# Patient Record
Sex: Male | Born: 1945 | Race: White | Hispanic: No | Marital: Married | Smoking: Former smoker
Health system: Southern US, Community
[De-identification: ages and names within clinical notes are randomized; demographics above are authoritative.]

## PROBLEM LIST (undated history)

## (undated) DIAGNOSIS — C801 Malignant (primary) neoplasm, unspecified: Secondary | ICD-10-CM

## (undated) DIAGNOSIS — G4730 Sleep apnea, unspecified: Secondary | ICD-10-CM

## (undated) DIAGNOSIS — G4489 Other headache syndrome: Secondary | ICD-10-CM

## (undated) DIAGNOSIS — E039 Hypothyroidism, unspecified: Secondary | ICD-10-CM

## (undated) DIAGNOSIS — E785 Hyperlipidemia, unspecified: Secondary | ICD-10-CM

## (undated) DIAGNOSIS — E079 Disorder of thyroid, unspecified: Secondary | ICD-10-CM

## (undated) DIAGNOSIS — R110 Nausea: Secondary | ICD-10-CM

## (undated) DIAGNOSIS — I6529 Occlusion and stenosis of unspecified carotid artery: Secondary | ICD-10-CM

## (undated) DIAGNOSIS — Z923 Personal history of irradiation: Secondary | ICD-10-CM

## (undated) DIAGNOSIS — I10 Essential (primary) hypertension: Secondary | ICD-10-CM

## (undated) DIAGNOSIS — R1310 Dysphagia, unspecified: Secondary | ICD-10-CM

## (undated) DIAGNOSIS — M1990 Unspecified osteoarthritis, unspecified site: Secondary | ICD-10-CM

## (undated) DIAGNOSIS — R634 Abnormal weight loss: Secondary | ICD-10-CM

## (undated) HISTORY — DX: Occlusion and stenosis of unspecified carotid artery: I65.29

## (undated) HISTORY — DX: Abnormal weight loss: R63.4

## (undated) HISTORY — PX: BACK SURGERY: SHX140

## (undated) HISTORY — PX: TUMOR REMOVAL: SHX12

## (undated) HISTORY — DX: Nausea: R11.0

## (undated) HISTORY — DX: Other headache syndrome: G44.89

## (undated) HISTORY — DX: Hypothyroidism, unspecified: E03.9

## (undated) HISTORY — DX: Hyperlipidemia, unspecified: E78.5

---

## 2001-07-16 ENCOUNTER — Ambulatory Visit (HOSPITAL_COMMUNITY): Admission: RE | Admit: 2001-07-16 | Discharge: 2001-07-16 | Payer: Self-pay | Admitting: Family Medicine

## 2001-07-16 ENCOUNTER — Encounter: Payer: Self-pay | Admitting: Family Medicine

## 2001-07-30 ENCOUNTER — Ambulatory Visit (HOSPITAL_COMMUNITY): Admission: RE | Admit: 2001-07-30 | Discharge: 2001-07-30 | Payer: Self-pay | Admitting: Family Medicine

## 2001-07-30 ENCOUNTER — Encounter: Payer: Self-pay | Admitting: Family Medicine

## 2001-08-06 ENCOUNTER — Encounter: Payer: Self-pay | Admitting: Family Medicine

## 2001-08-06 ENCOUNTER — Ambulatory Visit (HOSPITAL_COMMUNITY): Admission: RE | Admit: 2001-08-06 | Discharge: 2001-08-06 | Payer: Self-pay | Admitting: Family Medicine

## 2001-08-12 ENCOUNTER — Other Ambulatory Visit: Admission: RE | Admit: 2001-08-12 | Discharge: 2001-08-12 | Payer: Self-pay | Admitting: Otolaryngology

## 2001-09-10 ENCOUNTER — Inpatient Hospital Stay (HOSPITAL_COMMUNITY): Admission: AD | Admit: 2001-09-10 | Discharge: 2001-09-14 | Payer: Self-pay | Admitting: Otolaryngology

## 2001-09-10 ENCOUNTER — Encounter: Payer: Self-pay | Admitting: Otolaryngology

## 2001-09-17 ENCOUNTER — Ambulatory Visit: Admission: RE | Admit: 2001-09-17 | Discharge: 2001-12-16 | Payer: Self-pay | Admitting: Radiation Oncology

## 2001-09-22 ENCOUNTER — Ambulatory Visit (HOSPITAL_COMMUNITY): Admission: RE | Admit: 2001-09-22 | Discharge: 2001-09-22 | Payer: Self-pay | Admitting: Dentistry

## 2001-09-24 ENCOUNTER — Encounter (HOSPITAL_COMMUNITY): Admission: RE | Admit: 2001-09-24 | Discharge: 2001-12-23 | Payer: Self-pay | Admitting: Dentistry

## 2002-01-12 ENCOUNTER — Encounter: Admission: RE | Admit: 2002-01-12 | Discharge: 2002-01-22 | Payer: Self-pay | Admitting: Otolaryngology

## 2002-02-09 ENCOUNTER — Encounter: Payer: Self-pay | Admitting: Otolaryngology

## 2002-02-09 ENCOUNTER — Encounter: Admission: RE | Admit: 2002-02-09 | Discharge: 2002-02-09 | Payer: Self-pay | Admitting: Otolaryngology

## 2002-08-09 ENCOUNTER — Encounter: Admission: RE | Admit: 2002-08-09 | Discharge: 2002-08-09 | Payer: Self-pay | Admitting: Otolaryngology

## 2002-08-09 ENCOUNTER — Encounter: Payer: Self-pay | Admitting: Otolaryngology

## 2002-12-09 ENCOUNTER — Ambulatory Visit: Admission: RE | Admit: 2002-12-09 | Discharge: 2002-12-09 | Payer: Self-pay | Admitting: Radiation Oncology

## 2003-09-15 ENCOUNTER — Ambulatory Visit: Admission: RE | Admit: 2003-09-15 | Discharge: 2003-09-15 | Payer: Self-pay | Admitting: Radiation Oncology

## 2003-09-22 ENCOUNTER — Ambulatory Visit: Admission: RE | Admit: 2003-09-22 | Discharge: 2003-09-22 | Payer: Self-pay | Admitting: Radiation Oncology

## 2004-03-22 ENCOUNTER — Ambulatory Visit: Admission: RE | Admit: 2004-03-22 | Discharge: 2004-03-22 | Payer: Self-pay | Admitting: Radiation Oncology

## 2004-04-05 ENCOUNTER — Ambulatory Visit: Admission: RE | Admit: 2004-04-05 | Discharge: 2004-04-05 | Payer: Self-pay | Admitting: Family Medicine

## 2004-04-09 ENCOUNTER — Ambulatory Visit (HOSPITAL_COMMUNITY): Admission: RE | Admit: 2004-04-09 | Discharge: 2004-04-09 | Payer: Self-pay | Admitting: Radiation Oncology

## 2004-04-09 ENCOUNTER — Ambulatory Visit: Admission: RE | Admit: 2004-04-09 | Discharge: 2004-04-25 | Payer: Self-pay | Admitting: Radiation Oncology

## 2004-04-09 IMAGING — CR DG CHEST 2V
2 series · 2 of 2 positions shown · non-contrast
Comparison: [DATE] and [DATE].

CLINICAL DATA: Tonsillar carcinoma.  Follow-up.
 CHEST TWO VIEWS

[view not recorded (1 of 2)]
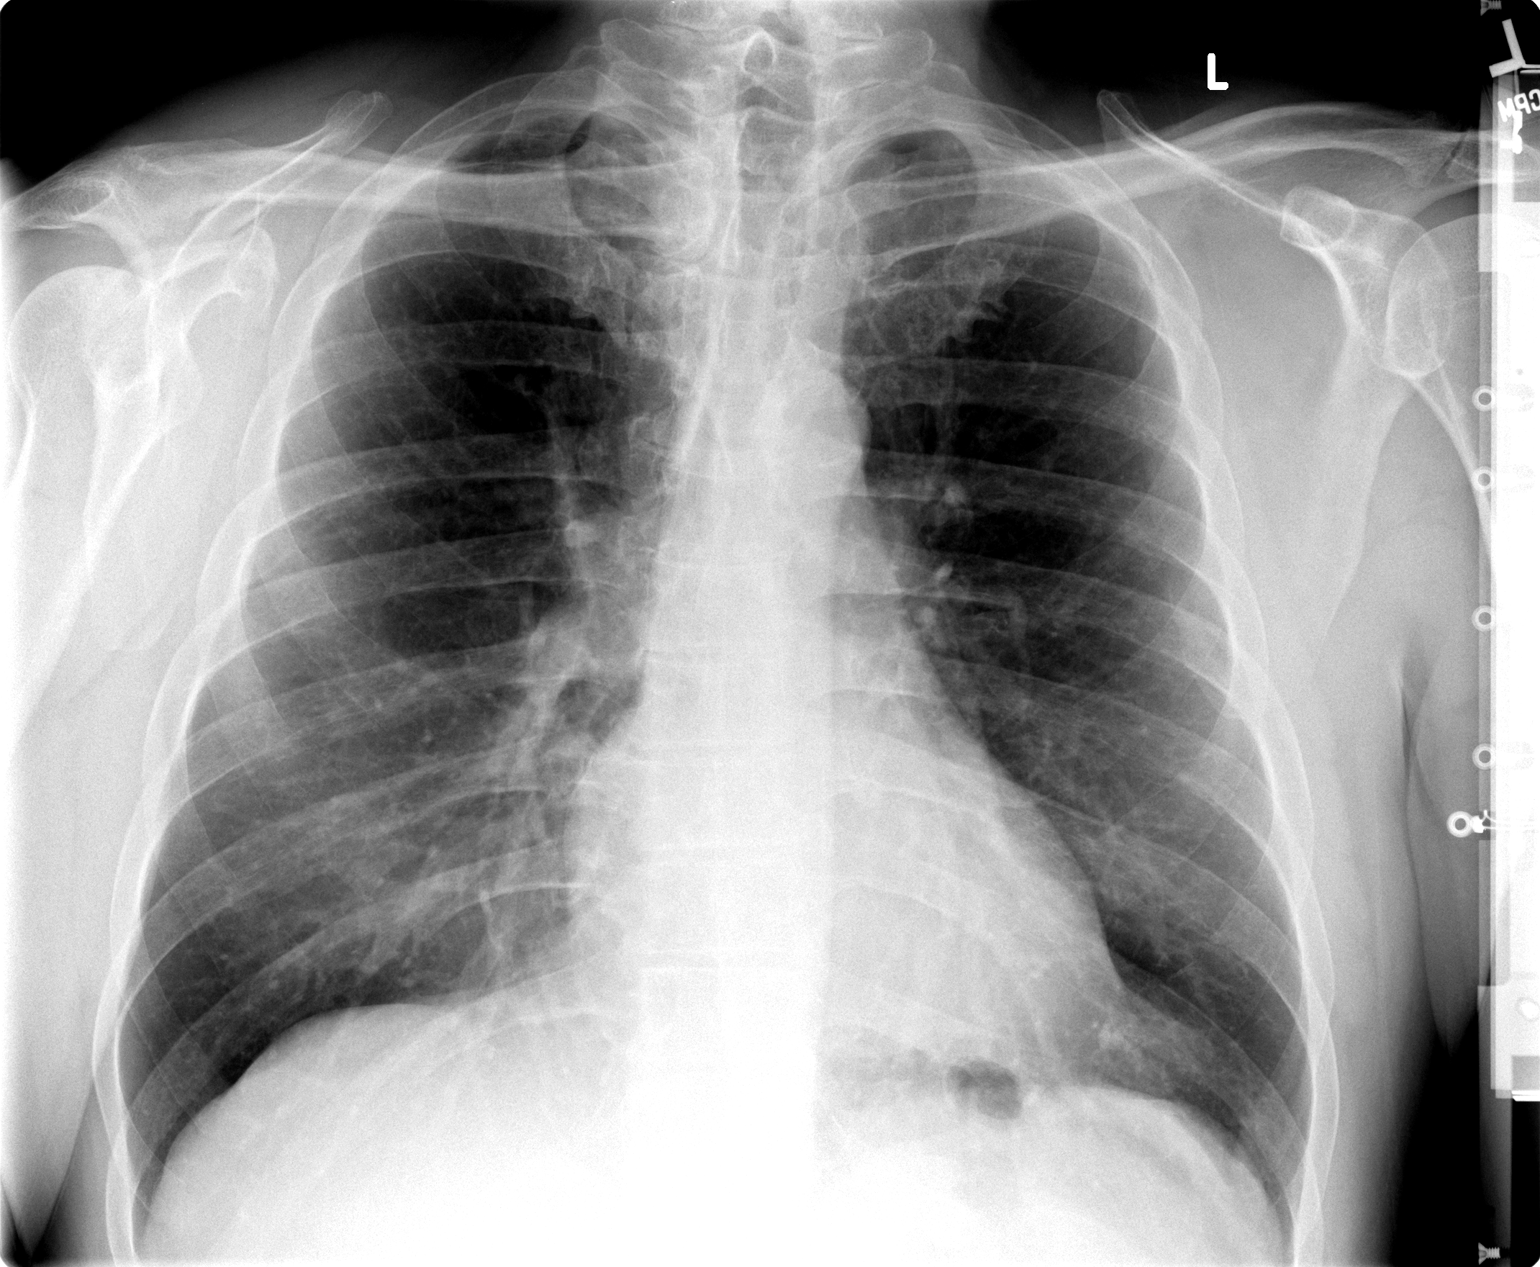

[view not recorded (2 of 2)]
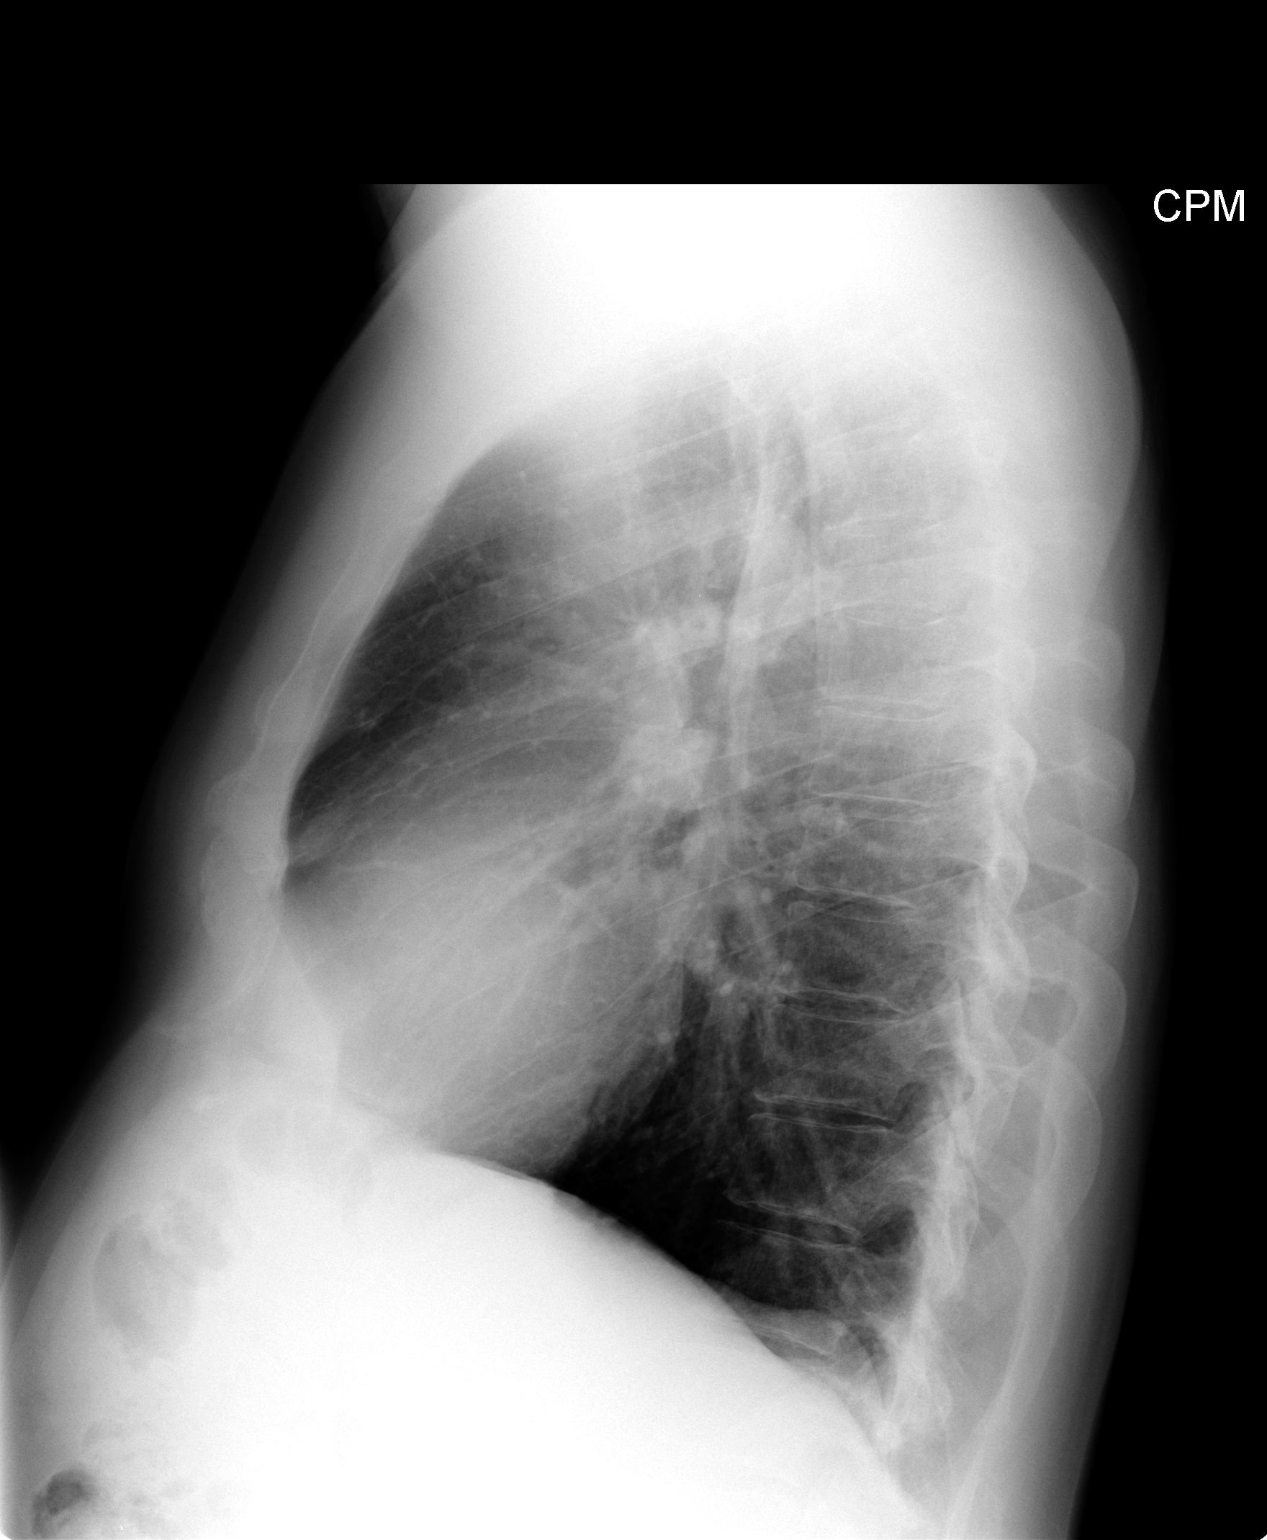

[2 of 2 positions shown; findings below may reference images not displayed]

Cardiomediastinal silhouette is stable and unremarkable.  The lungs are clear.  Bony thorax and upper abdomen are unremarkable.  
 IMPRESSION
 No evidence of active cardiopulmonary disease.

## 2004-07-27 IMAGING — CR DG CHEST 2V
2 series · 2 of 2 positions shown · non-contrast
Comparison: [DATE].

CLINICAL DATA: Tonsillar carcinoma.  Evaluate for metastatic disease. 
 CHEST - 2 VIEW:

[w chest pa]
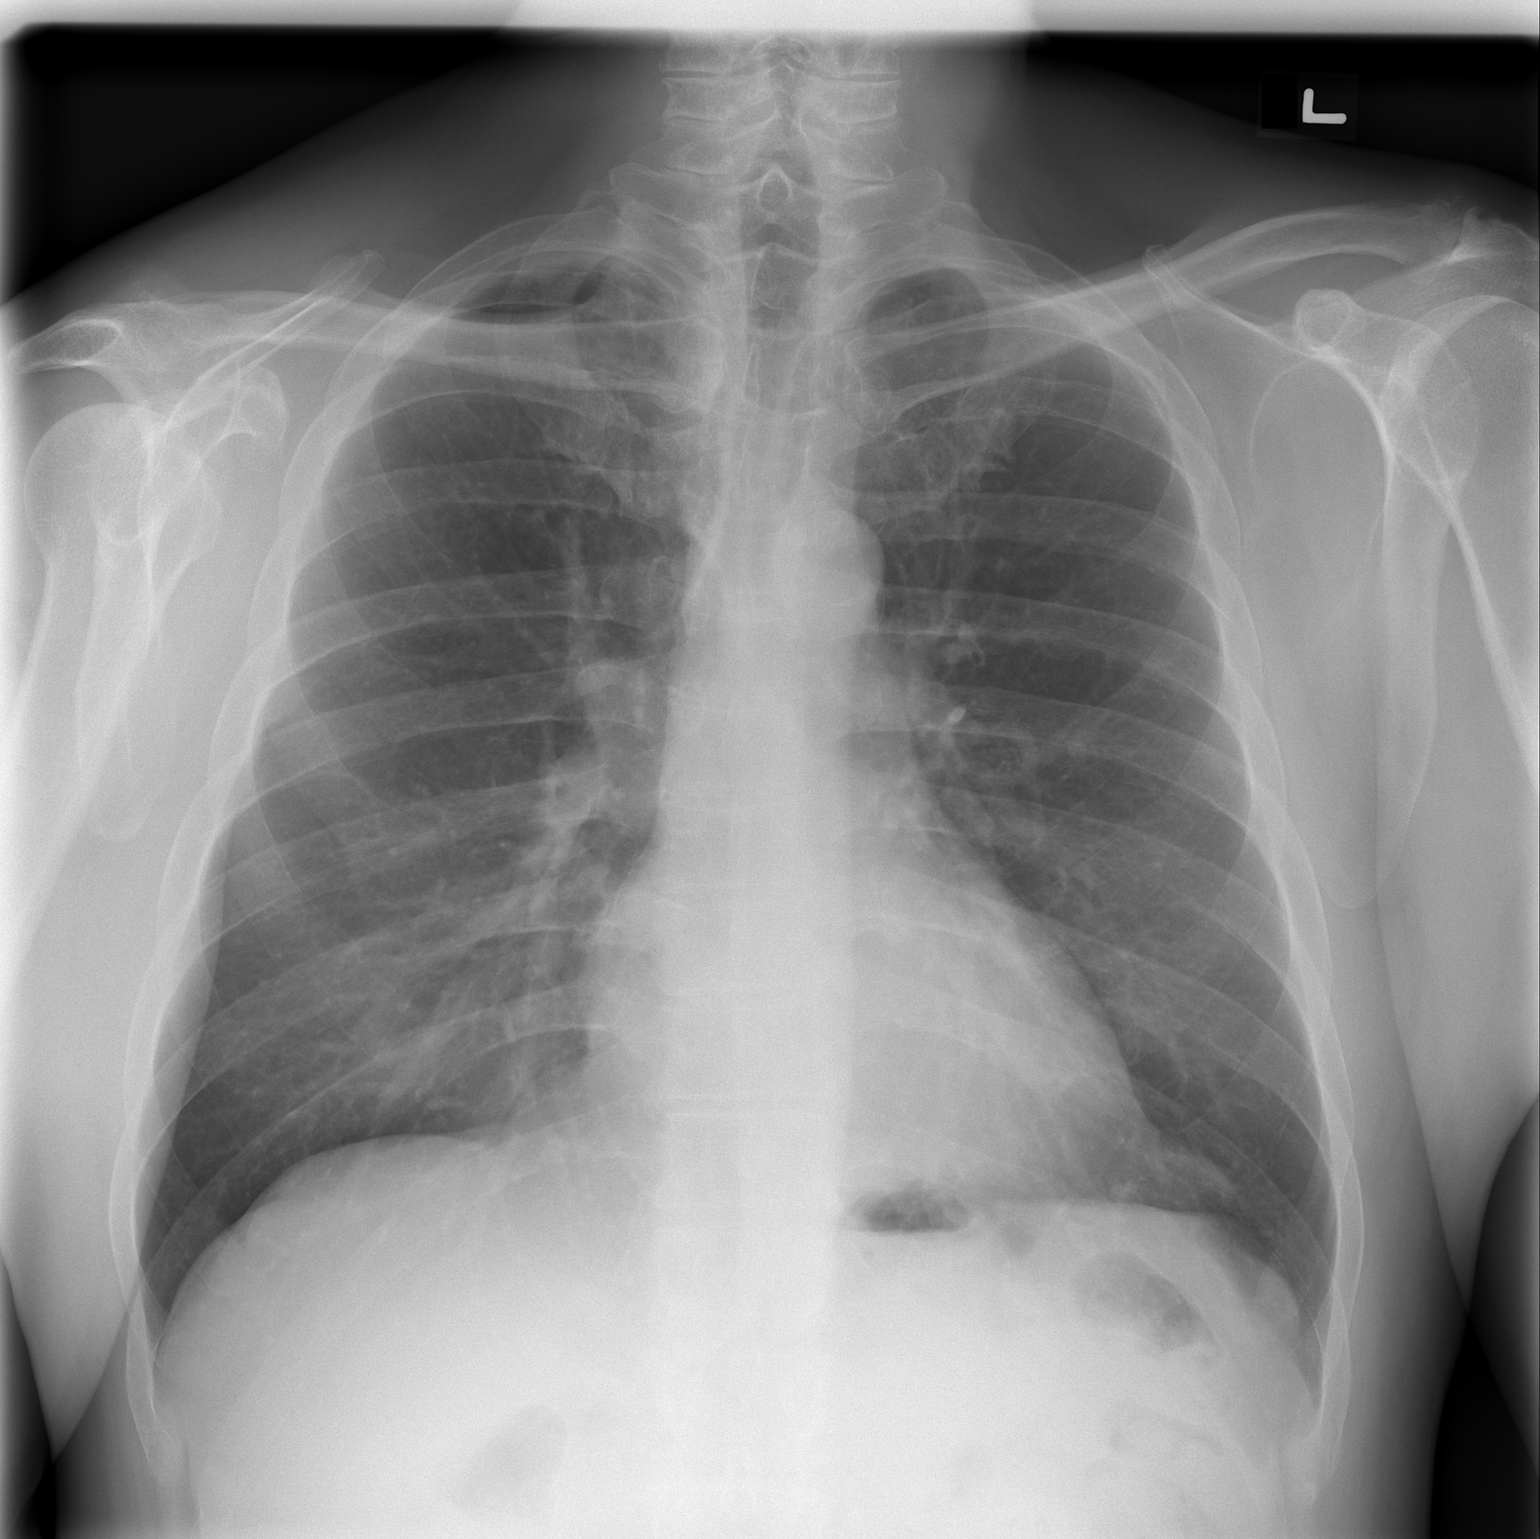

[w chest lat]
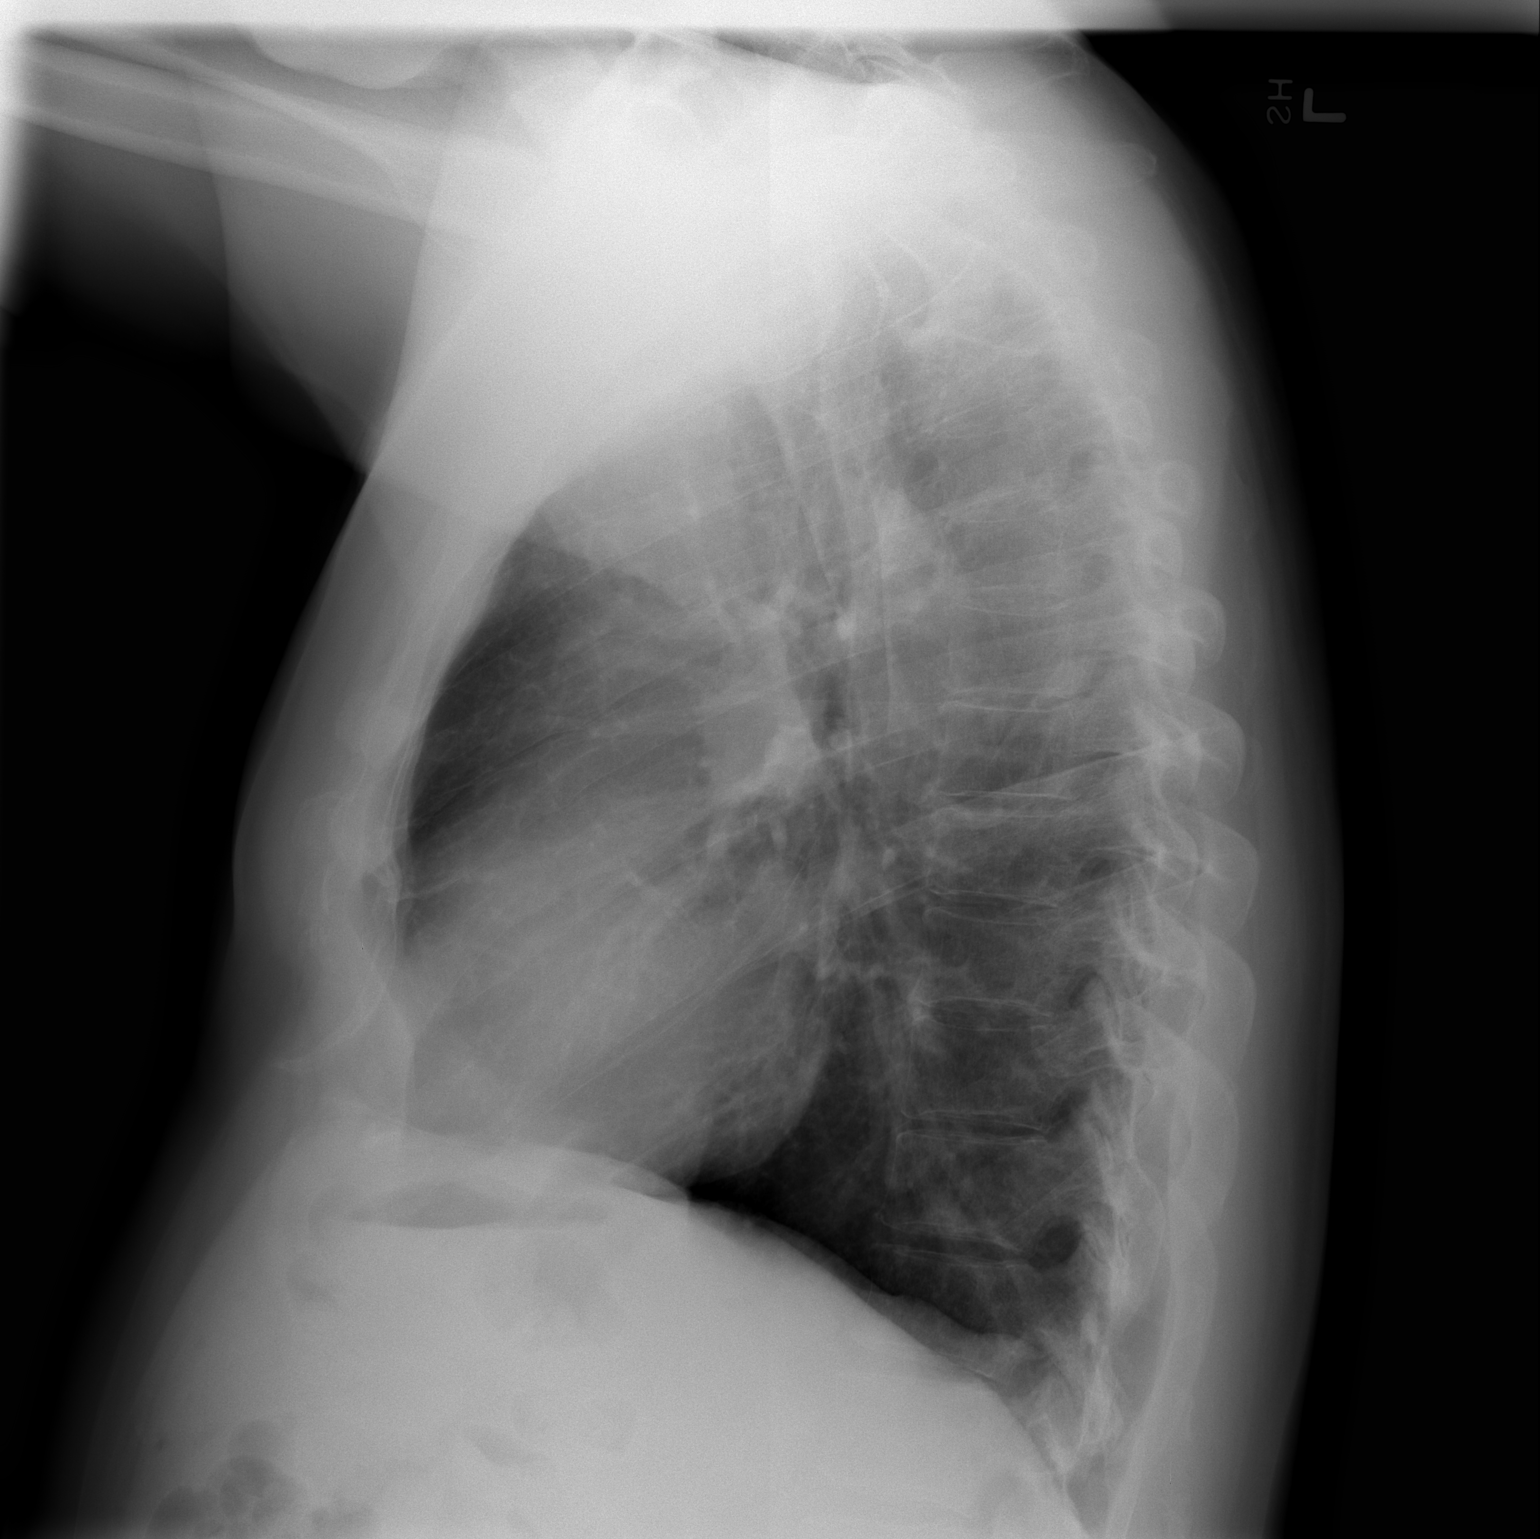

[2 of 2 positions shown; findings below may reference images not displayed]

The cardiomediastinal contours are stable without evidence of adenopathy.  The lungs are clear.  There is no pleural effusion.  Osseous structures appear unchanged.
IMPRESSION: Stable chest.  No active findings. No evidence of metastatic disease.

## 2004-10-04 ENCOUNTER — Ambulatory Visit: Admission: RE | Admit: 2004-10-04 | Discharge: 2004-10-04 | Payer: Self-pay | Admitting: Radiation Oncology

## 2005-04-11 ENCOUNTER — Ambulatory Visit (HOSPITAL_COMMUNITY): Admission: RE | Admit: 2005-04-11 | Discharge: 2005-04-11 | Payer: Self-pay | Admitting: Radiation Oncology

## 2006-04-10 ENCOUNTER — Ambulatory Visit (HOSPITAL_COMMUNITY): Admission: RE | Admit: 2006-04-10 | Discharge: 2006-04-10 | Payer: Self-pay | Admitting: Radiation Oncology

## 2006-04-10 IMAGING — CR DG CHEST 2V
2 series · 2 of 2 positions shown · non-contrast
Comparison: [DATE].

CLINICAL DATA: Annual follow-up, tonsillar CA.  Previous smoker. 
 CHEST - 2 VIEW:

[w chest pa]
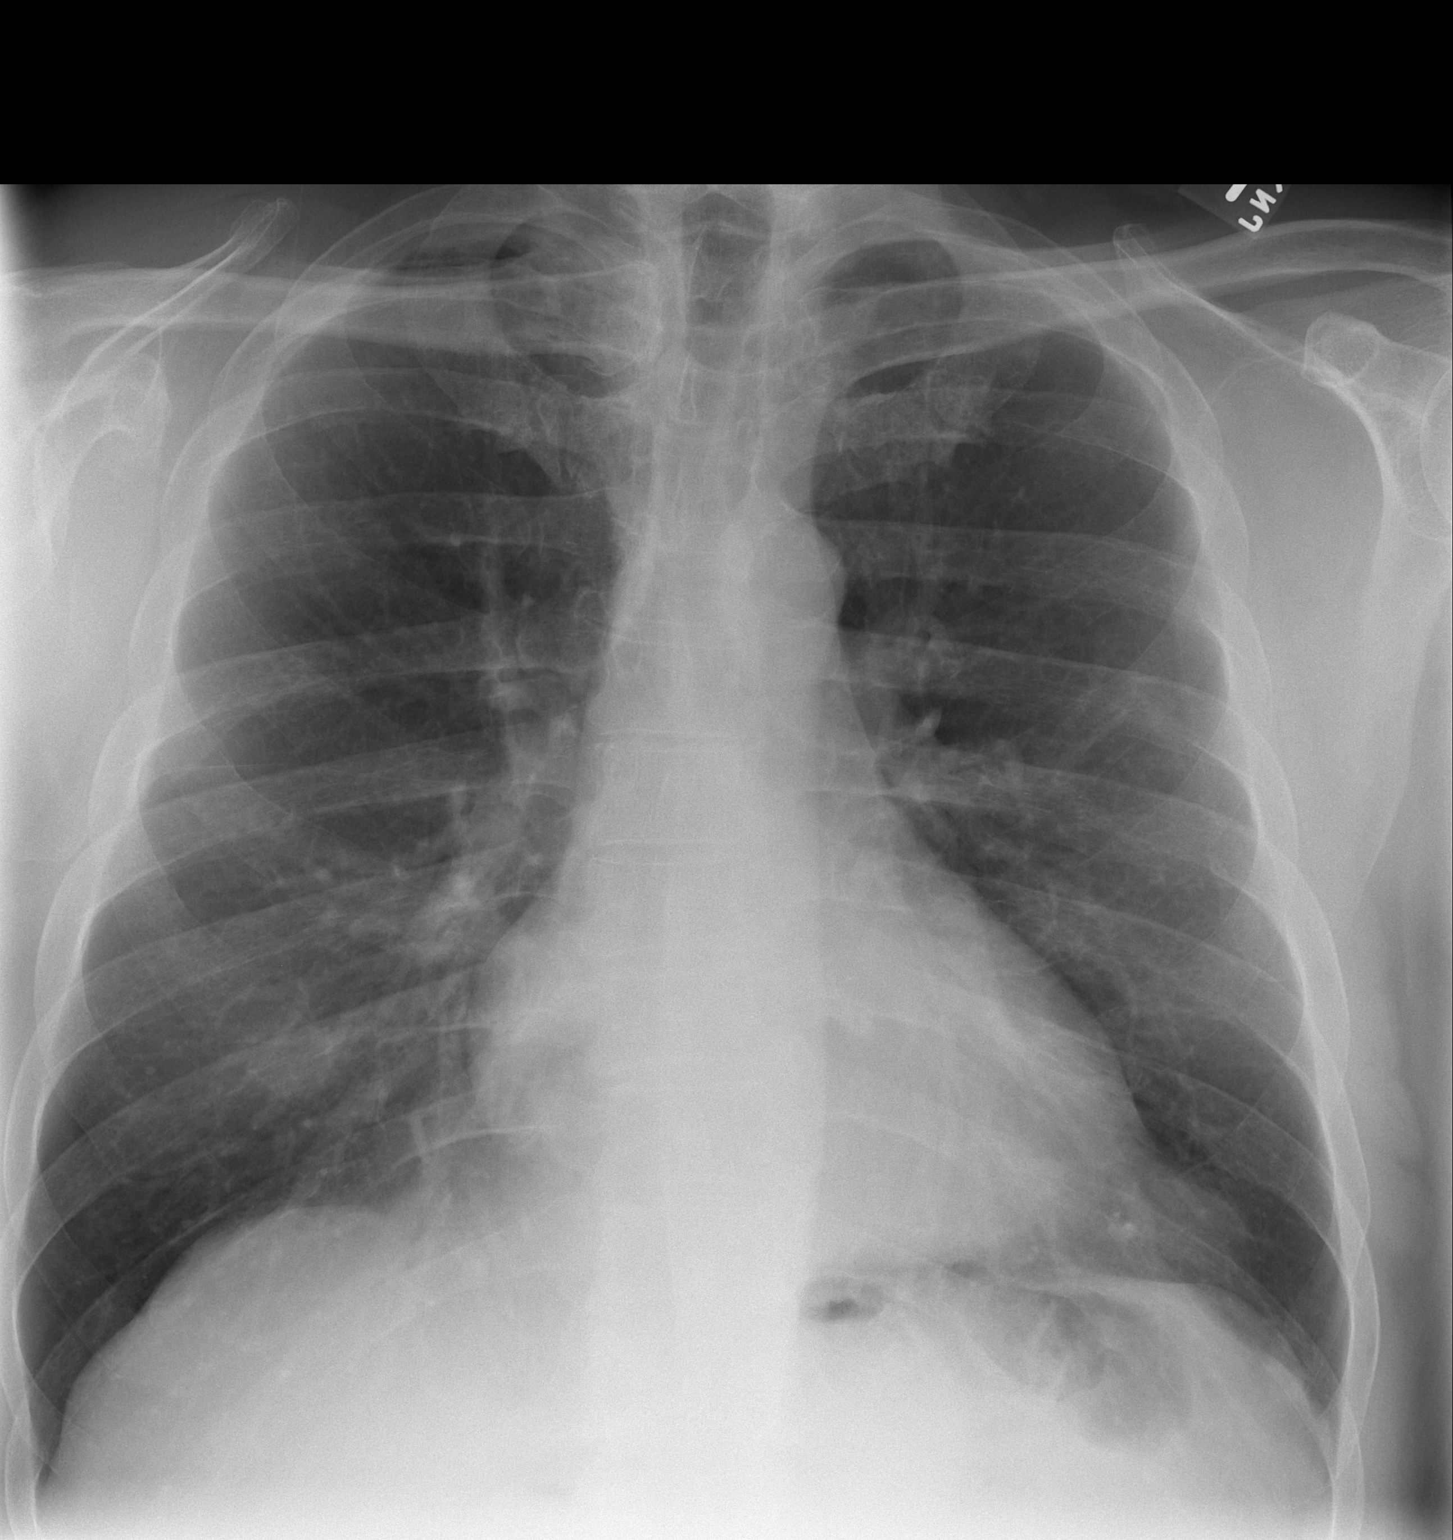

[w chest lat]
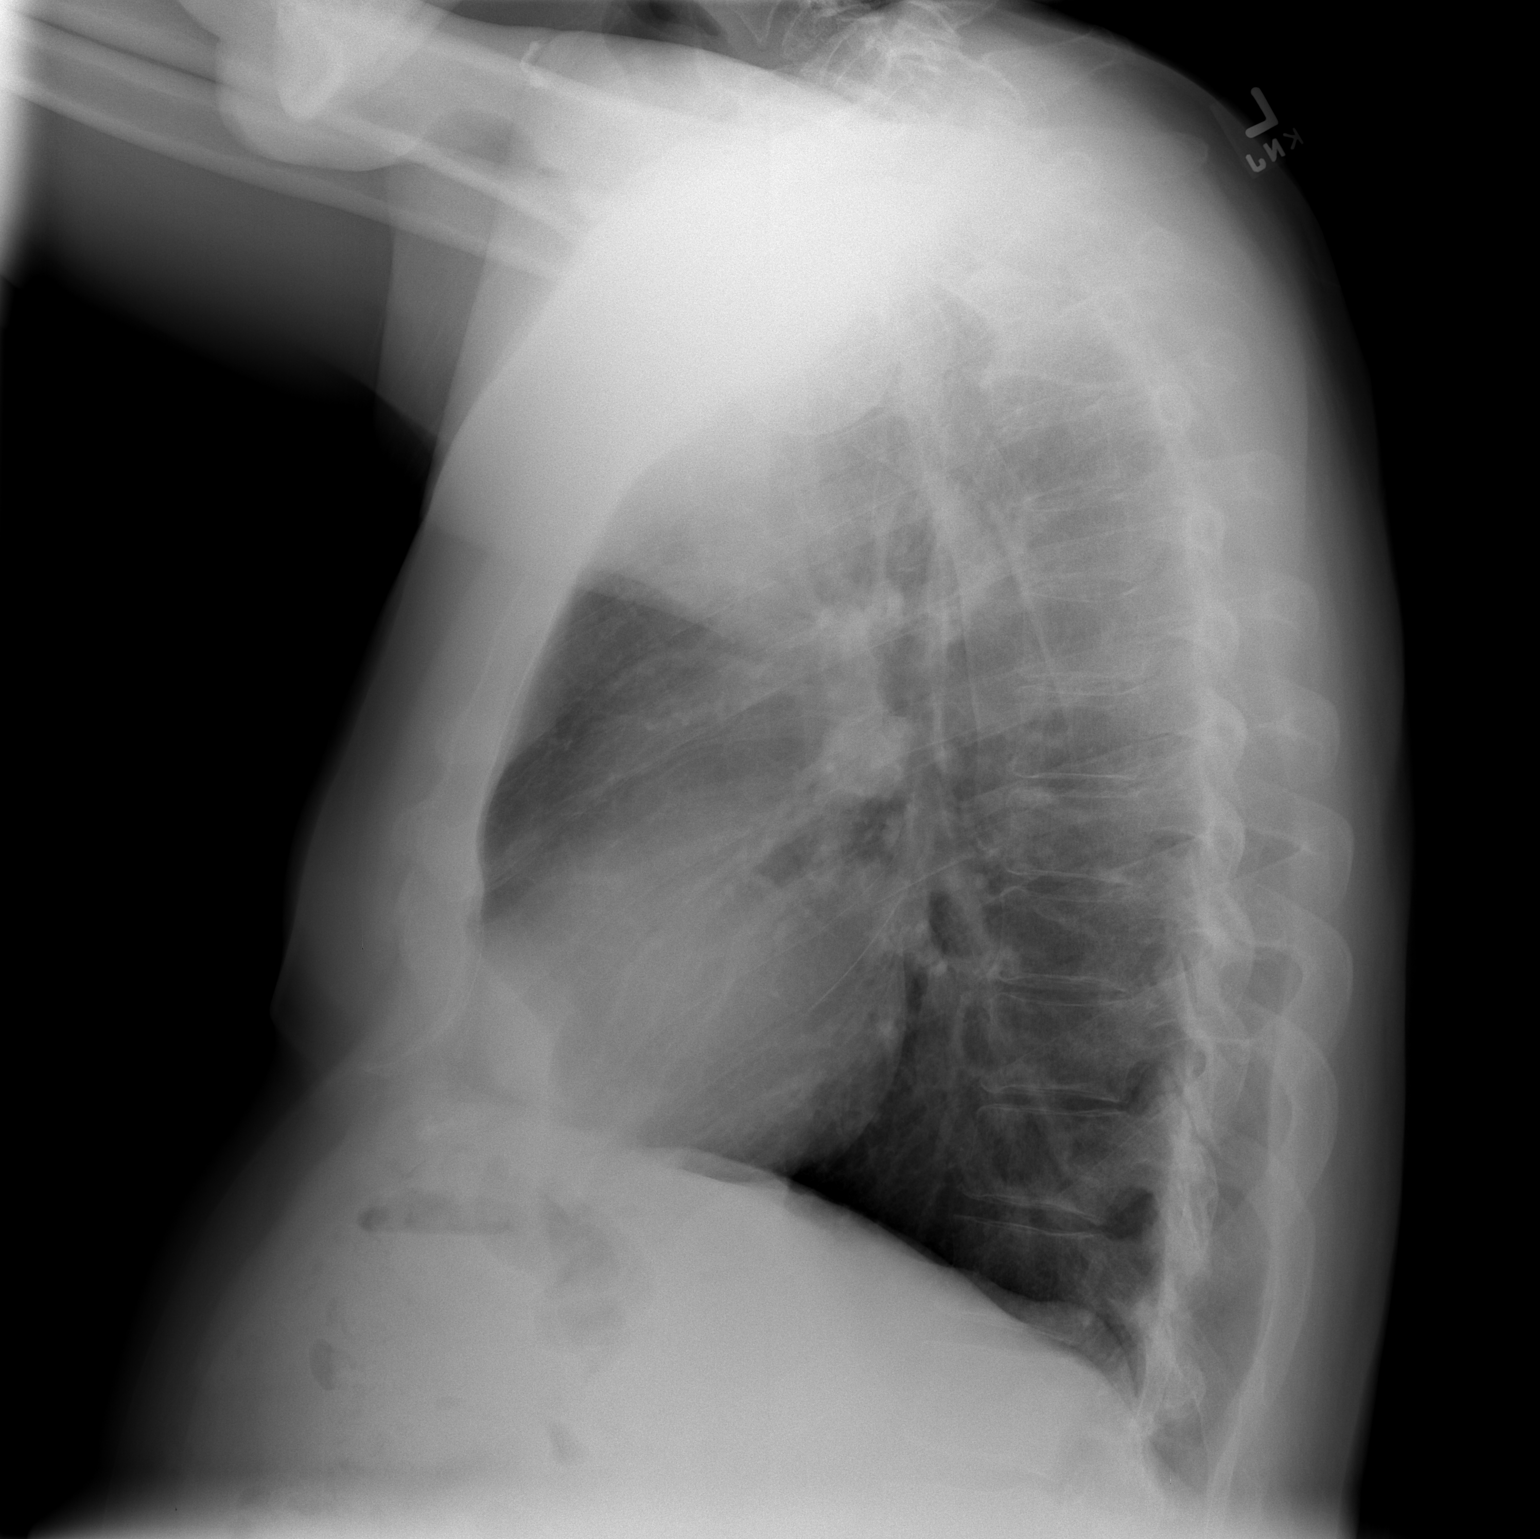

[2 of 2 positions shown; findings below may reference images not displayed]

FINDINGS: Subtle oval opacity projects over the right lower lung zone.  It may be due to confluence of vascular and bony structures simulating a nodule/mass.  I cannot exclude a mass in the right lower lung zone.  This suspect density measures approximately 2.2 cm.  
 Mild cardiomegaly.   Consider Chest CT scan for further assessment.
IMPRESSION: Right oval opacity projects over right lower lung zone.  I view this with moderate suspicion for a nodule/mass.  I do not appreciate this on the lateral view, however, a mass cannot be excluded.  Recommend CT.

## 2006-04-14 ENCOUNTER — Ambulatory Visit: Admission: RE | Admit: 2006-04-14 | Discharge: 2006-07-13 | Payer: Self-pay | Admitting: Radiation Oncology

## 2006-04-15 LAB — CREATININE, SERUM: Creatinine, Ser: 0.82 mg/dL (ref 0.40–1.50)

## 2006-04-16 ENCOUNTER — Ambulatory Visit (HOSPITAL_COMMUNITY): Admission: RE | Admit: 2006-04-16 | Discharge: 2006-04-16 | Payer: Self-pay | Admitting: Radiation Oncology

## 2006-04-16 IMAGING — CT CT CHEST W/ CM
2 of 4 series · 15 of 36 positions shown, 18 images · IV contrast (omnipaque)
Comparison: [DATE].

CLINICAL DATA: Question nodule or mass on chest x-ray of [DATE].  History of tonsillar cancer. Exsmoker.  
 CHEST CT WITH CONTRAST:
TECHNIQUE: Multidetector CT imaging of the chest was performed following the standard protocol during bolus administration of intravenous contrast.
 Contrast:  80 cc Omnipaque 300.

[Series 2: chest routine 5.0 b40f · axial · 0.70mm/px · z∈[-330,-30]mm · 12 of 72 slices shown, 15 images]
[im 6/72  mediastinal]
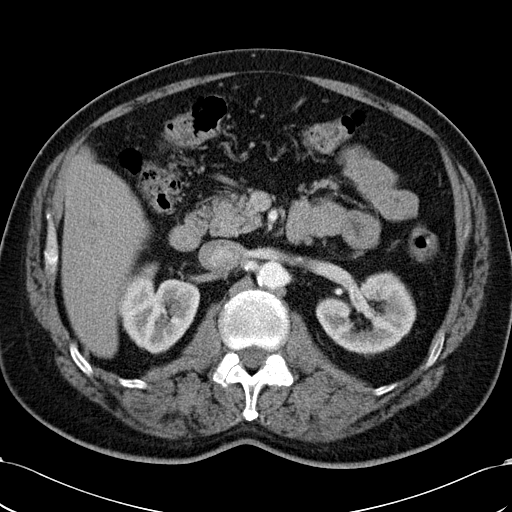
[im 6/72  lung]
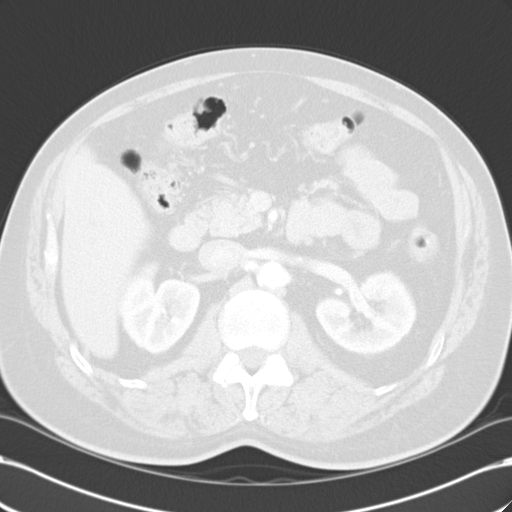
[im 11/72  lung]
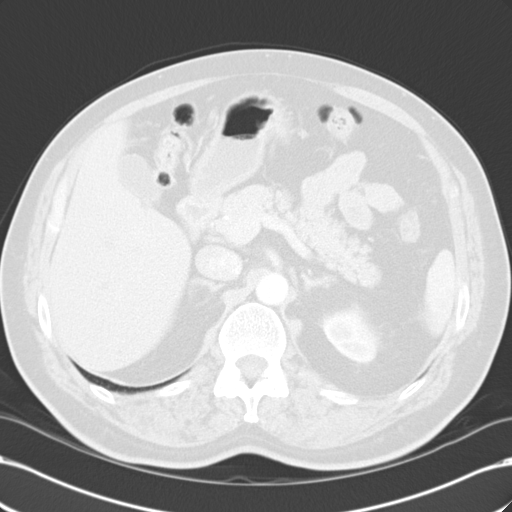
[im 16/72  lung]
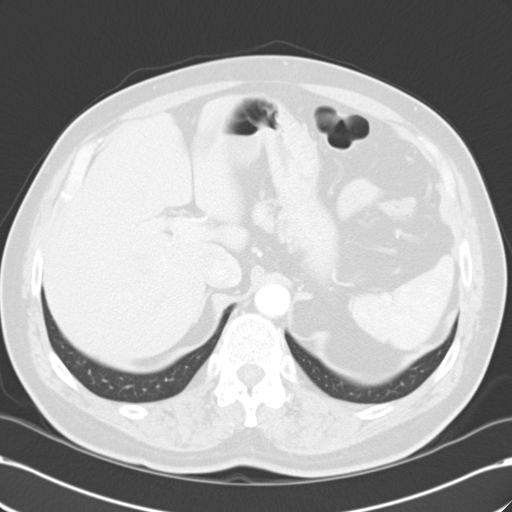
[im 21/72  lung]
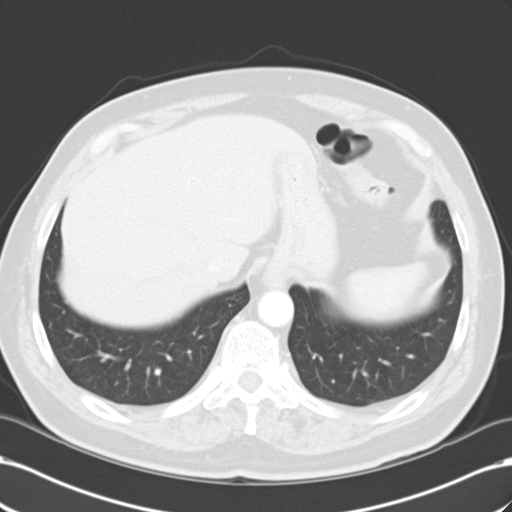
[im 26/72  mediastinal]
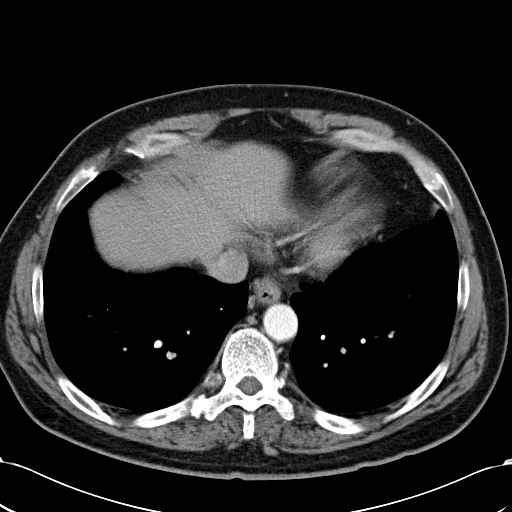
[im 26/72  lung]
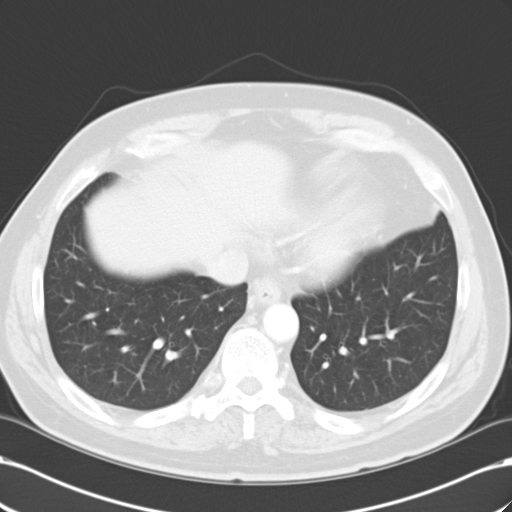
[im 31/72  lung]
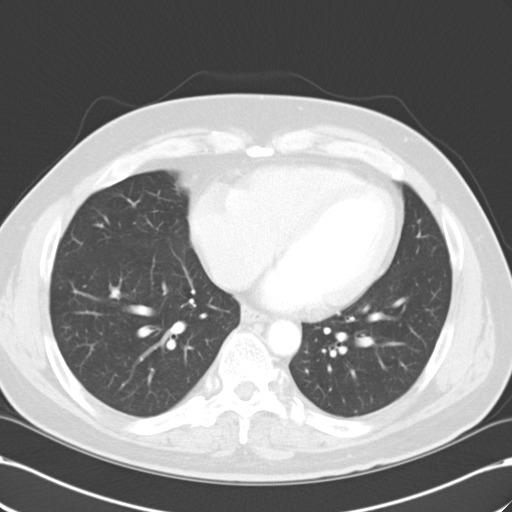
[im 41/72  lung]
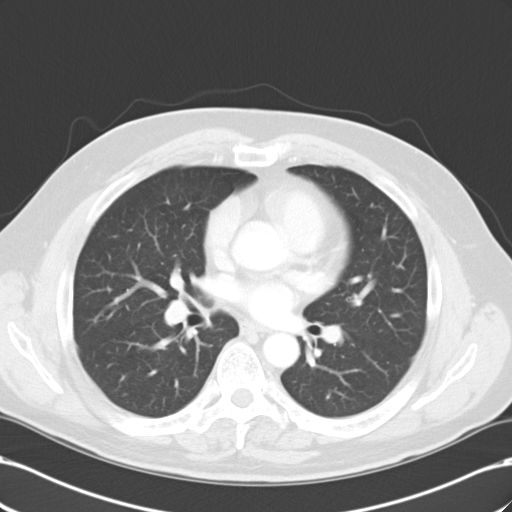
[im 46/72  lung]
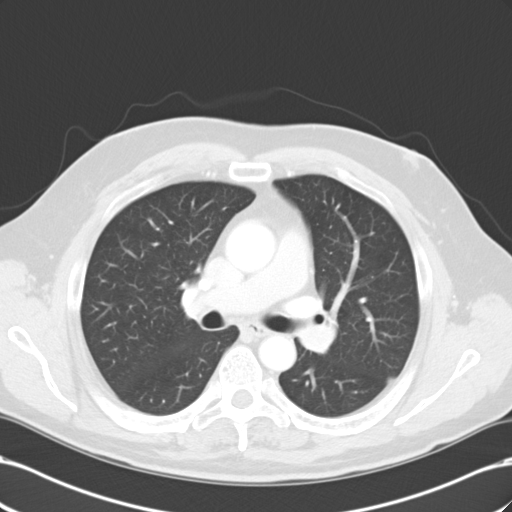
[im 51/72  mediastinal]
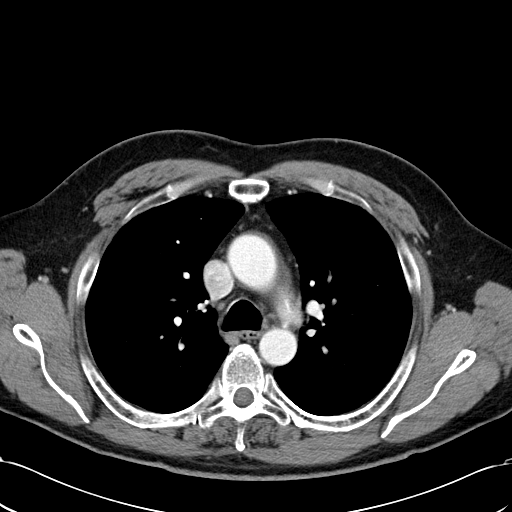
[im 51/72  lung]
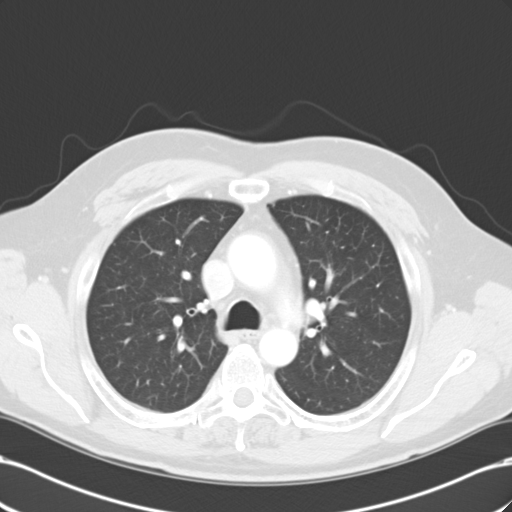
[im 56/72  lung]
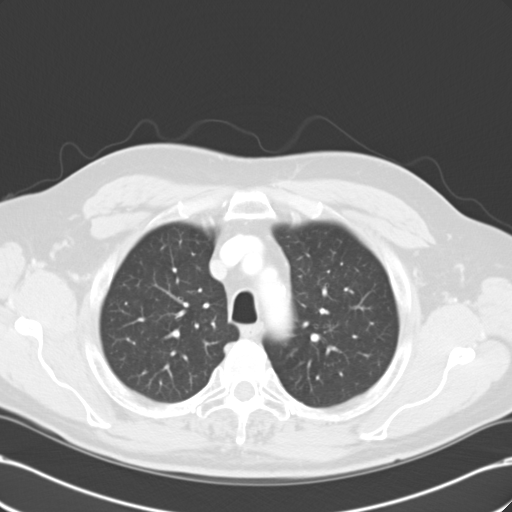
[im 61/72  lung]
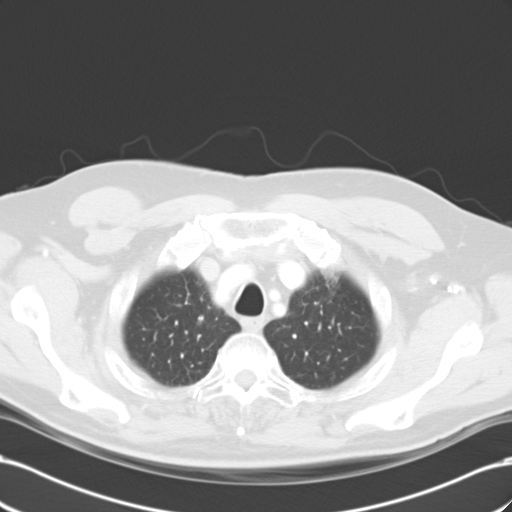
[im 66/72  lung]
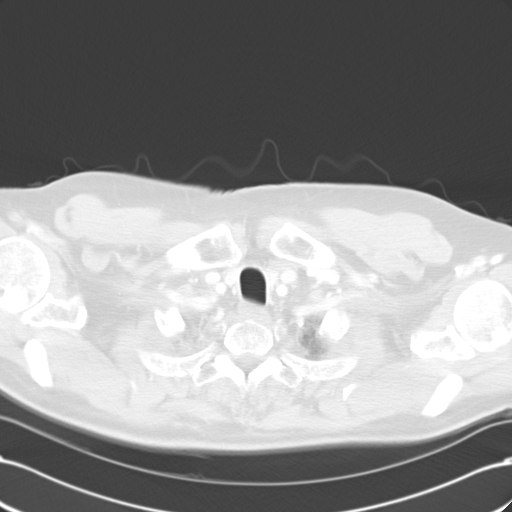

[Series 4: mpr coronal chest · coronal · 0.74mm/px · 3 of 76 slices shown]
[im 16/76  lung]
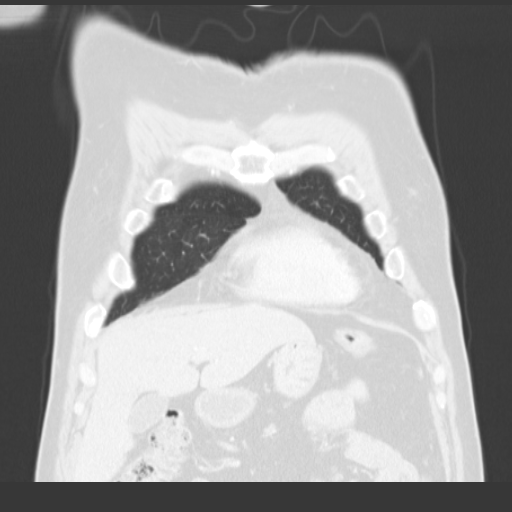
[im 31/76  lung]
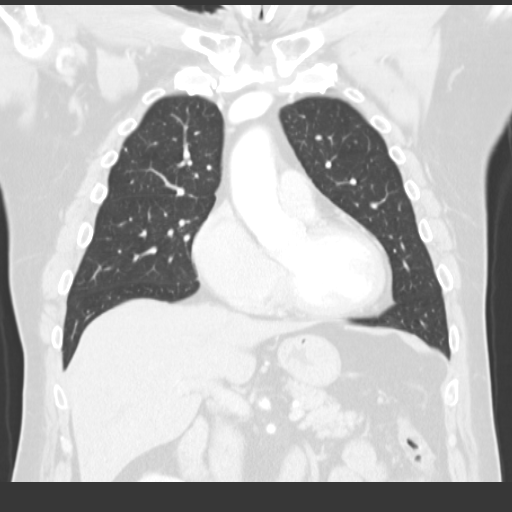
[im 46/76  lung]
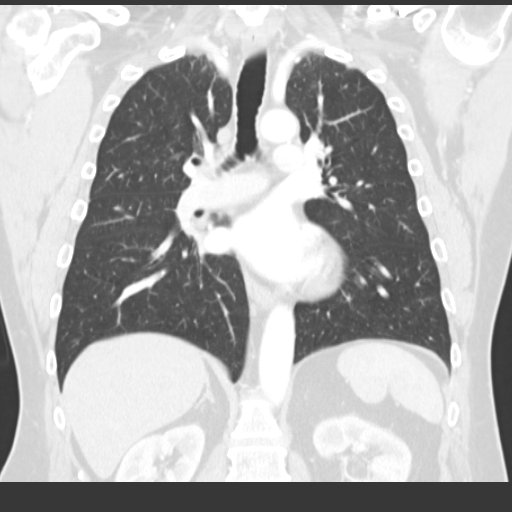

[15 of 36 positions shown; findings below may reference images not displayed]

FINDINGS: No pathologically enlarged mediastinal, hilar or axillary lymph nodes.  Mild gynecomastia bilaterally.   Heart size mildly enlarged.  No pericardial fluid.  Small hiatal hernia. 
 Biapical scarring.  No pulmonary parenchymal finding to correspond to questioned nodule on the right.  The lungs are clear.  No pleural fluid.  Airway unremarkable.  
 Incidental imaging of the upper abdomen shows a tiny low attenuation lesion in the left kidney which is too small to characterize.  No worrisome lytic or sclerotic lesion.
IMPRESSION: No pulmonary parenchymal finding to correspond to questioned abnormality on chest x-ray of [DATE].

## 2006-06-30 IMAGING — CR C SP
1 series · 6 of 6 positions shown · non-contrast
Comparison: none

EXAM: CHEST ROUTINE

REASON: ELEVATED BP; Cmt:CARDIAC;

[Series 1: view not recorded · 0.17mm/px · 6 of 6 slices shown]
[im 1/6]
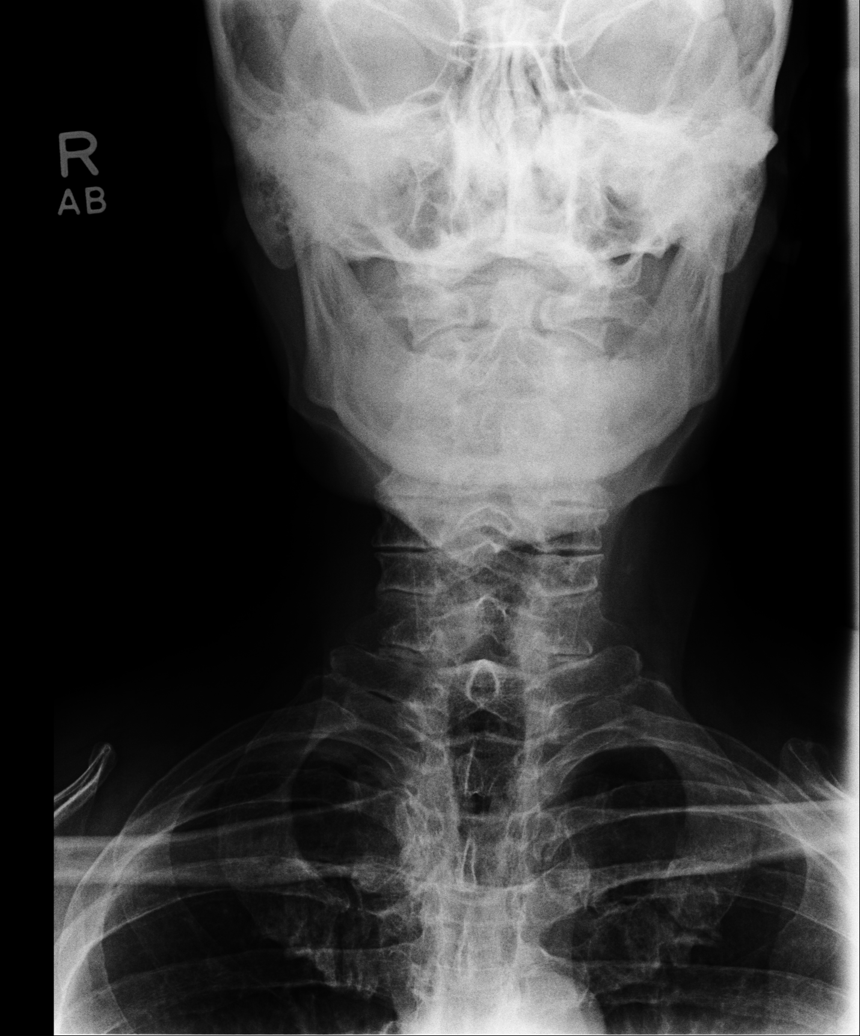
[im 2/6]
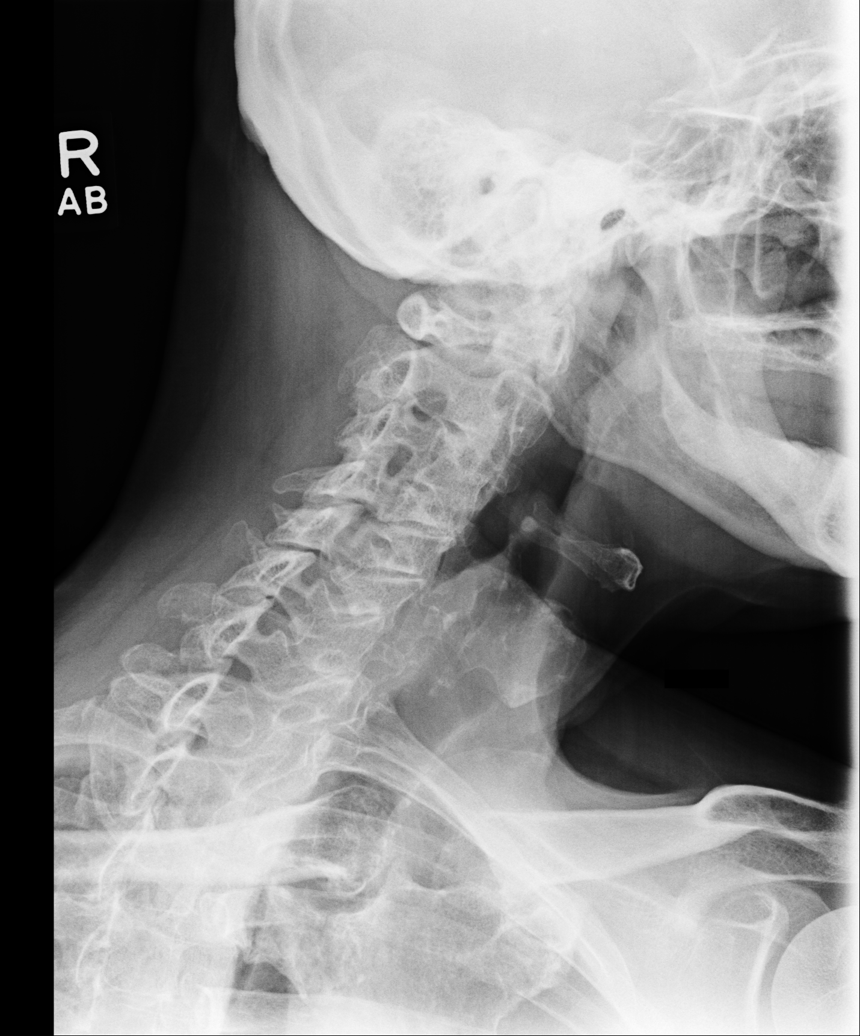
[im 3/6]
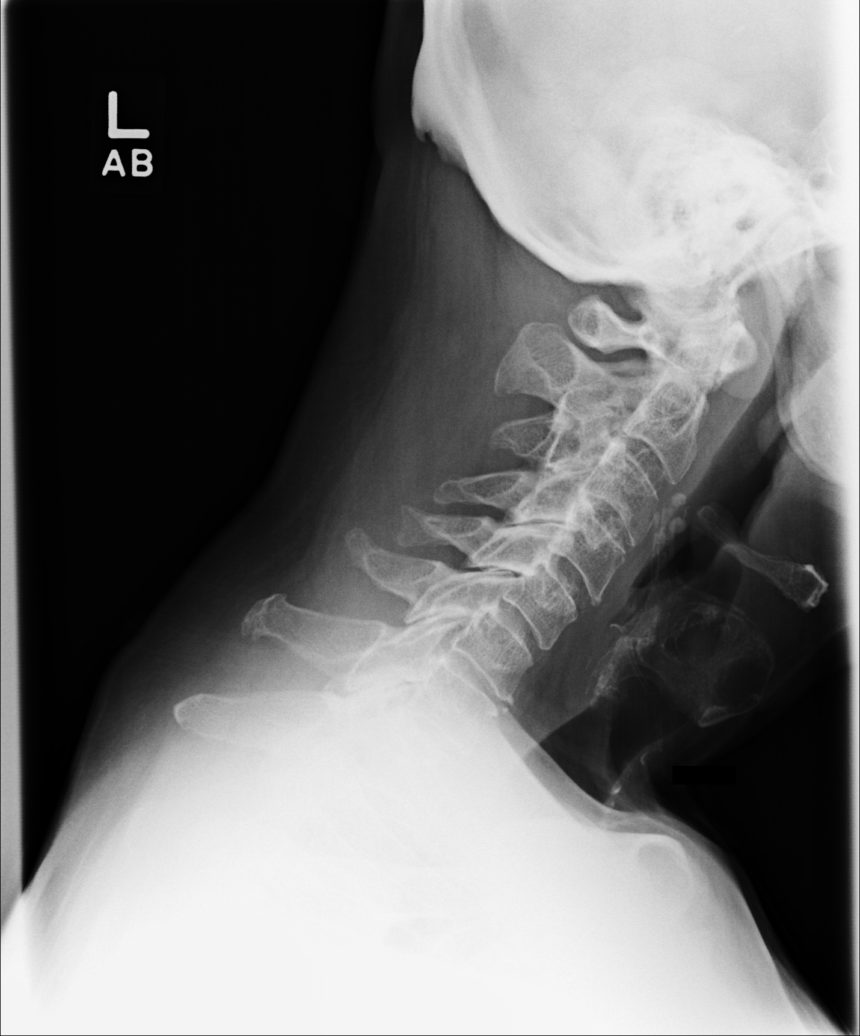
[im 4/6]
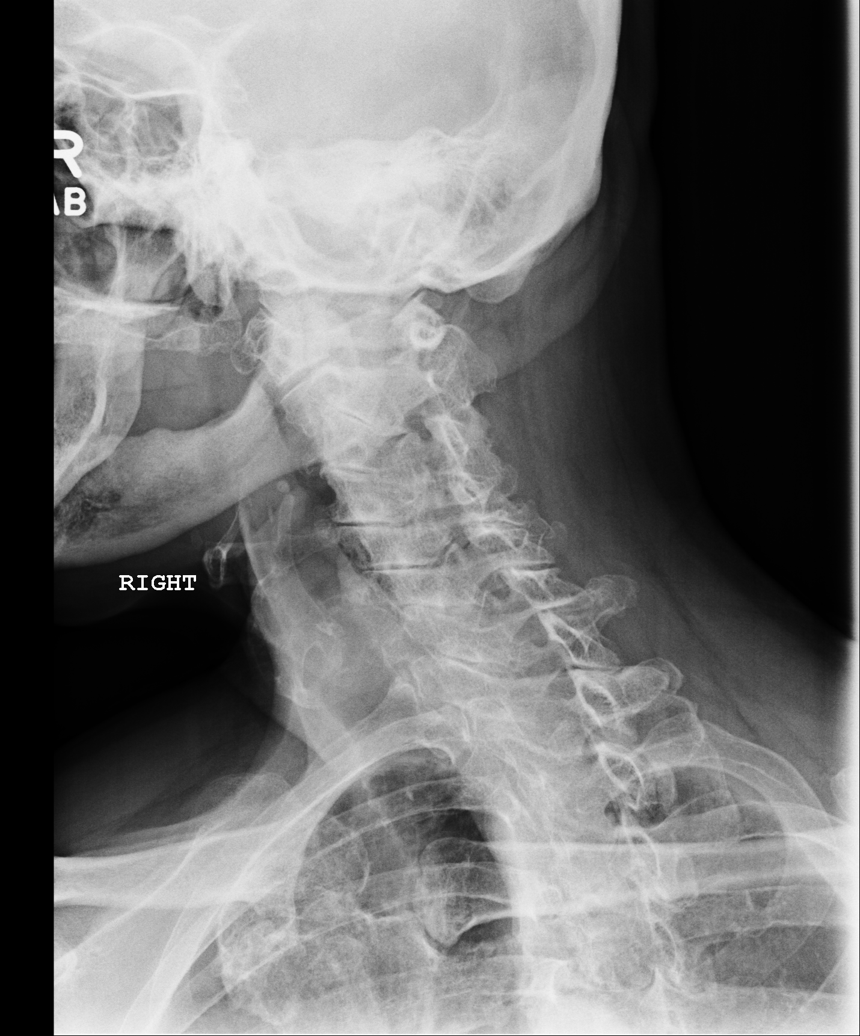
[im 5/6]
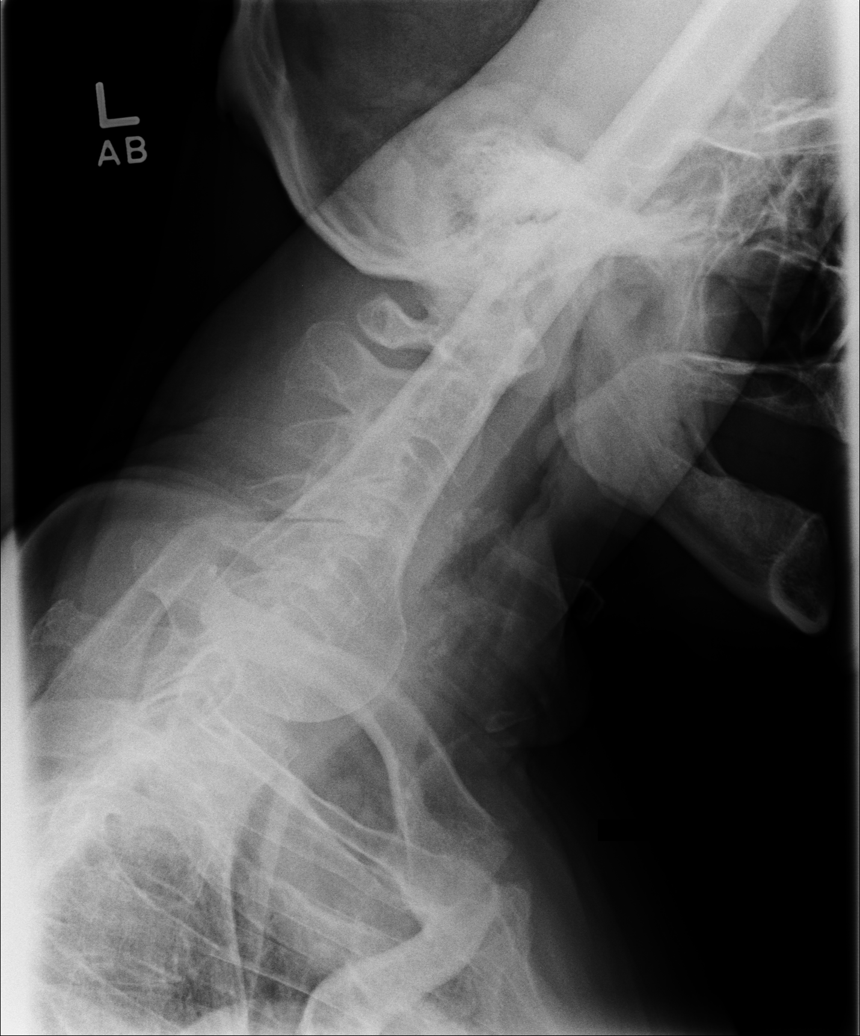
[im 6/6]
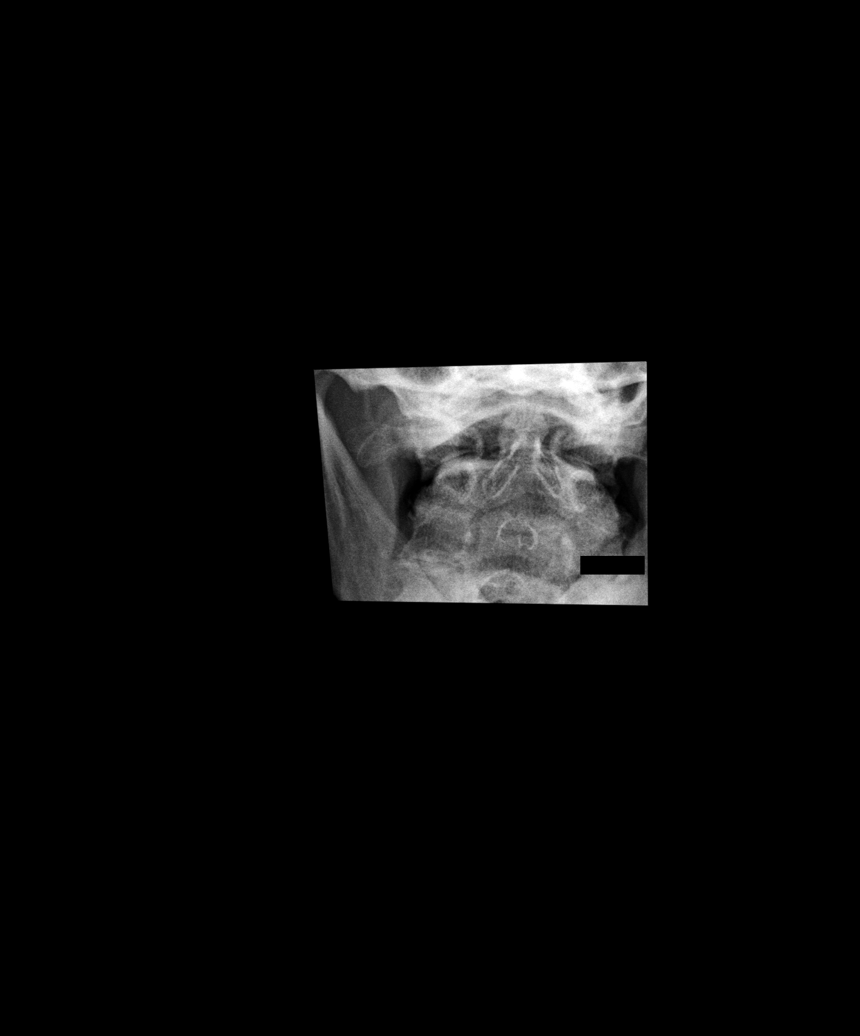

[6 of 6 positions shown; findings below may reference images not displayed]

FINDINGS: The lungs are clear. The heart and pulmonary vessels are
normal. The bony and mediastinal structures are unremarkable.
IMPRESSION: No plain film evidence of acute cardiopulmonary abnormality.

## 2011-08-01 ENCOUNTER — Emergency Department (HOSPITAL_COMMUNITY)
Admission: EM | Admit: 2011-08-01 | Discharge: 2011-08-01 | Disposition: A | Discharge status: Home / Self Care | Payer: Non-veteran care | Attending: Emergency Medicine | Admitting: Emergency Medicine

## 2011-08-01 ENCOUNTER — Encounter (HOSPITAL_COMMUNITY): Payer: Self-pay | Admitting: *Deleted

## 2011-08-01 DIAGNOSIS — I10 Essential (primary) hypertension: Secondary | ICD-10-CM | POA: Insufficient documentation

## 2011-08-01 DIAGNOSIS — Y92009 Unspecified place in unspecified non-institutional (private) residence as the place of occurrence of the external cause: Secondary | ICD-10-CM | POA: Insufficient documentation

## 2011-08-01 DIAGNOSIS — R55 Syncope and collapse: Secondary | ICD-10-CM | POA: Insufficient documentation

## 2011-08-01 DIAGNOSIS — Y9301 Activity, walking, marching and hiking: Secondary | ICD-10-CM | POA: Insufficient documentation

## 2011-08-01 DIAGNOSIS — E079 Disorder of thyroid, unspecified: Secondary | ICD-10-CM | POA: Insufficient documentation

## 2011-08-01 DIAGNOSIS — S51809A Unspecified open wound of unspecified forearm, initial encounter: Secondary | ICD-10-CM | POA: Insufficient documentation

## 2011-08-01 DIAGNOSIS — S51811A Laceration without foreign body of right forearm, initial encounter: Secondary | ICD-10-CM

## 2011-08-01 DIAGNOSIS — W01119A Fall on same level from slipping, tripping and stumbling with subsequent striking against unspecified sharp object, initial encounter: Secondary | ICD-10-CM | POA: Insufficient documentation

## 2011-08-01 DIAGNOSIS — W268XXA Contact with other sharp object(s), not elsewhere classified, initial encounter: Secondary | ICD-10-CM | POA: Insufficient documentation

## 2011-08-01 HISTORY — DX: Malignant (primary) neoplasm, unspecified: C80.1

## 2011-08-01 HISTORY — DX: Essential (primary) hypertension: I10

## 2011-08-01 HISTORY — DX: Disorder of thyroid, unspecified: E07.9

## 2011-08-01 MED ORDER — LIDOCAINE-EPINEPHRINE (PF) 1 %-1:200000 IJ SOLN
10.0000 mL | Freq: Once | INTRAMUSCULAR | Status: AC
  Administered 2011-08-01: 30 mL via INTRADERMAL
  Filled 2011-08-01: qty 10

## 2011-08-01 NOTE — ED Notes (Signed)
Pt slipped walking down a wooden ramp and caught self on railing, which had a nail sticking up out of it. Punctured anterior aspect of forearm, estimates approx 1/2" deep. Bleeding controlled. Wrapped with guaze by NT on arrival. Sensation and movement intact.

## 2011-08-01 NOTE — ED Provider Notes (Signed)
History     CSN: [id number]  Arrival date & time 08/01/11  1010   First MD Initiated Contact with Patient 08/01/11 1036      Chief Complaint  Patient presents with  . Puncture Wound    (Consider location/radiation/quality/duration/timing/severity/associated sxs/prior treatment) HPI  Patient relates he was walking down his mother's ramp at her house and he slipped and fell and when he reached to grab the railing there was a nail sticking out that punctured the flexor surface of his right forearm. He denies any numbness or tingling in his extremities he denies any other injury. Patient is right-handed.  PCP [facility]  Past Medical History  Diagnosis Date  . Hypertension   . Thyroid disease   . Cancer     Past Surgical History  Procedure Date  . Tumor removal     in neck  . Back surgery    radical neck dissection on the right for tonsillar cancer in 2003 also treated with radiation treatment  No family history on file.  History  Substance Use Topics  . Smoking status: Never Smoker   . Smokeless tobacco: Not on file  . Alcohol Use: Yes     rarely   retired    Review of Systems  All other systems reviewed and are negative.    Allergies  Review of patient's allergies indicates no known allergies.  Home Medications   Current Outpatient Rx  Name Route Sig Dispense Refill  . HYDROCODONE-ACETAMINOPHEN 5-500 MG PO TABS Oral Take 0.5-1 tablets by mouth every 6 (six) hours as needed. pain    . LEVOTHYROXINE SODIUM 75 MCG PO TABS Oral Take 75 mcg by mouth daily.      BP 164/84  Pulse 87  Temp(Src) 98 F (36.7 C) (Oral)  Resp 16  Wt 215 lb (97.523 kg)  SpO2 96%  Vital signs normal    Physical Exam  Nursing note and vitals reviewed. Constitutional: He is oriented to person, place, and time. He appears well-developed and well-nourished.  Non-toxic appearance. He does not appear ill. No distress.  HENT:  Head: Normocephalic and atraumatic.  Right Ear:  External ear normal.  Left Ear: External ear normal.  Nose: Nose normal. No mucosal edema or rhinorrhea.  Mouth/Throat: Oropharynx is clear and moist and mucous membranes are normal. No dental abscesses or uvula swelling.  Eyes: Conjunctivae and EOM are normal. Pupils are equal, round, and reactive to light.  Neck: Normal range of motion and full passive range of motion without pain. Neck supple.       Patient is noted to have had a radical right neck dissection that is well-healed  Cardiovascular: Normal rate, regular rhythm and normal heart sounds.  Exam reveals no gallop and no friction rub.   No murmur heard. Pulmonary/Chest: Effort normal and breath sounds normal. No respiratory distress. He has no wheezes. He has no rhonchi. He has no rales. He exhibits no tenderness and no crepitus.  Abdominal: Soft. Normal appearance and bowel sounds are normal. He exhibits no distension. There is no tenderness. There is no rebound and no guarding.  Musculoskeletal: Normal range of motion. He exhibits no edema and no tenderness.       Arms:      Moves all extremities well. Patient has a 2 cm laceration on the volar aspect of his mid right forearm. There is fatty tissue exposed. He has excellent range of motion. He denies any numbness in his fingers. Pulses are intact. There is  no obvious debris in the wound.  Neurological: He is alert and oriented to person, place, and time. He has normal strength. No cranial nerve deficit.  Skin: Skin is warm, dry and intact. No rash noted. No erythema. No pallor.  Psychiatric: He has a normal mood and affect. His speech is normal and behavior is normal. His mood appears not anxious.    ED Course  LACERATION REPAIR Date/Time: 08/01/2011 11:48 AM Performed by: Lynelle Doctor, Maraki Macquarrie L Authorized by: Ward Givens Consent: Verbal consent obtained. Consent given by: patient Patient understanding: patient states understanding of the procedure being performed Patient identity  confirmed: verbally with patient, arm band and hospital-assigned identification number Time out: Immediately prior to procedure a "time out" was called to verify the correct patient, procedure, equipment, support staff and site/side marked as required. Body area: upper extremity Location details: right lower arm Laceration length: 2 cm Tendon involvement: none Nerve involvement: none Vascular damage: no Anesthesia: local infiltration Local anesthetic: lidocaine 1% with epinephrine Preparation: Patient was prepped and draped in the usual sterile fashion. Amount of cleaning: standard Debridement: minimal (Patient had small skin flap that was removed) Skin closure: 4-0 nylon Number of sutures: 4 Technique: simple Approximation: close Approximation difficulty: simple Dressing: 4x4 sterile gauze and antibiotic ointment   (including critical care time)  As finishing his last suture, pt stated he didn't feel well. Pt has diaphoresis, color pale. Pt was laid flat and initial VS showed HR in 40's and then when 65 his BP was 103 systolic. Pt given COKE and kept laying down until he felt better.    Recheck prior to discharge, pt has normal color, states he feels fine now.  Diagnoses that have been ruled out:  None  Diagnoses that are still under consideration:  None  Final diagnoses:  Laceration of forearm, right  Vasovagal episode   Plan discharge  MDM          Ward Givens, MD 08/01/11 1234

## 2011-08-01 NOTE — ED Notes (Signed)
Lynelle Doctor, EDP called out from room during suturing procedure, stating pt was going to pass out. Chair laid flat, vitals assessed. Pt appeared pale and was diaphoretic. Pulse originally 45 upon RN entering room, quickly returned to normal to 69 after lying flat. [place] EDP ordered regular coke to drink and to lie flat until color returns. Will continue to monitor.

## 2011-08-01 NOTE — Discharge Instructions (Signed)
Keep your wound clean and dry. Use triple antibiotic ointment on the wound daily. Recheck for any signs of infection such as increased swelling, pain, drainage of pus, red streaks. The sutures need to be removed in about 10 days, you can return here or go to [facility] to have it done.

## 2012-07-19 ENCOUNTER — Emergency Department (HOSPITAL_COMMUNITY)
Admission: EM | Admit: 2012-07-19 | Discharge: 2012-07-19 | Disposition: A | Discharge status: Home / Self Care | Payer: Non-veteran care | Attending: Emergency Medicine | Admitting: Emergency Medicine

## 2012-07-19 ENCOUNTER — Encounter (HOSPITAL_COMMUNITY): Payer: Self-pay | Admitting: Emergency Medicine

## 2012-07-19 ENCOUNTER — Emergency Department (HOSPITAL_COMMUNITY): Payer: Non-veteran care

## 2012-07-19 DIAGNOSIS — C490 Malignant neoplasm of connective and soft tissue of head, face and neck: Secondary | ICD-10-CM | POA: Insufficient documentation

## 2012-07-19 DIAGNOSIS — R079 Chest pain, unspecified: Secondary | ICD-10-CM | POA: Insufficient documentation

## 2012-07-19 DIAGNOSIS — E079 Disorder of thyroid, unspecified: Secondary | ICD-10-CM | POA: Insufficient documentation

## 2012-07-19 DIAGNOSIS — I10 Essential (primary) hypertension: Secondary | ICD-10-CM | POA: Insufficient documentation

## 2012-07-19 DIAGNOSIS — Z79899 Other long term (current) drug therapy: Secondary | ICD-10-CM | POA: Insufficient documentation

## 2012-07-19 LAB — BASIC METABOLIC PANEL
BUN: 12 mg/dL (ref 6–23)
Calcium: 9 mg/dL (ref 8.4–10.5)
Creatinine, Ser: 0.69 mg/dL (ref 0.50–1.35)
GFR calc non Af Amer: >90 mL/min (ref >90)
Glucose, Bld: 112 mg/dL — ABNORMAL HIGH (ref 70–99)
Sodium: 133 mEq/L — ABNORMAL LOW (ref 135–145)

## 2012-07-19 LAB — HEPATIC FUNCTION PANEL
ALT: 29 U/L (ref 0–53)
Alkaline Phosphatase: 64 U/L (ref 39–117)
Bilirubin, Direct: <0.1 mg/dL (ref 0.0–0.3)
Total Bilirubin: 0.4 mg/dL (ref 0.3–1.2)

## 2012-07-19 LAB — CBC
HCT: 40.6 % (ref 39.0–52.0)
Hemoglobin: 14.1 g/dL (ref 13.0–17.0)
MCH: 32.3 pg (ref 26.0–34.0)
MCHC: 34.7 g/dL (ref 30.0–36.0)

## 2012-07-19 IMAGING — CR DG CHEST 2V
2 series · 2 of 2 positions shown · non-contrast
Comparison: CT chest dated [DATE]

CLINICAL DATA: Left chest/rib pain

CHEST - 2 VIEW

[w chest pa]
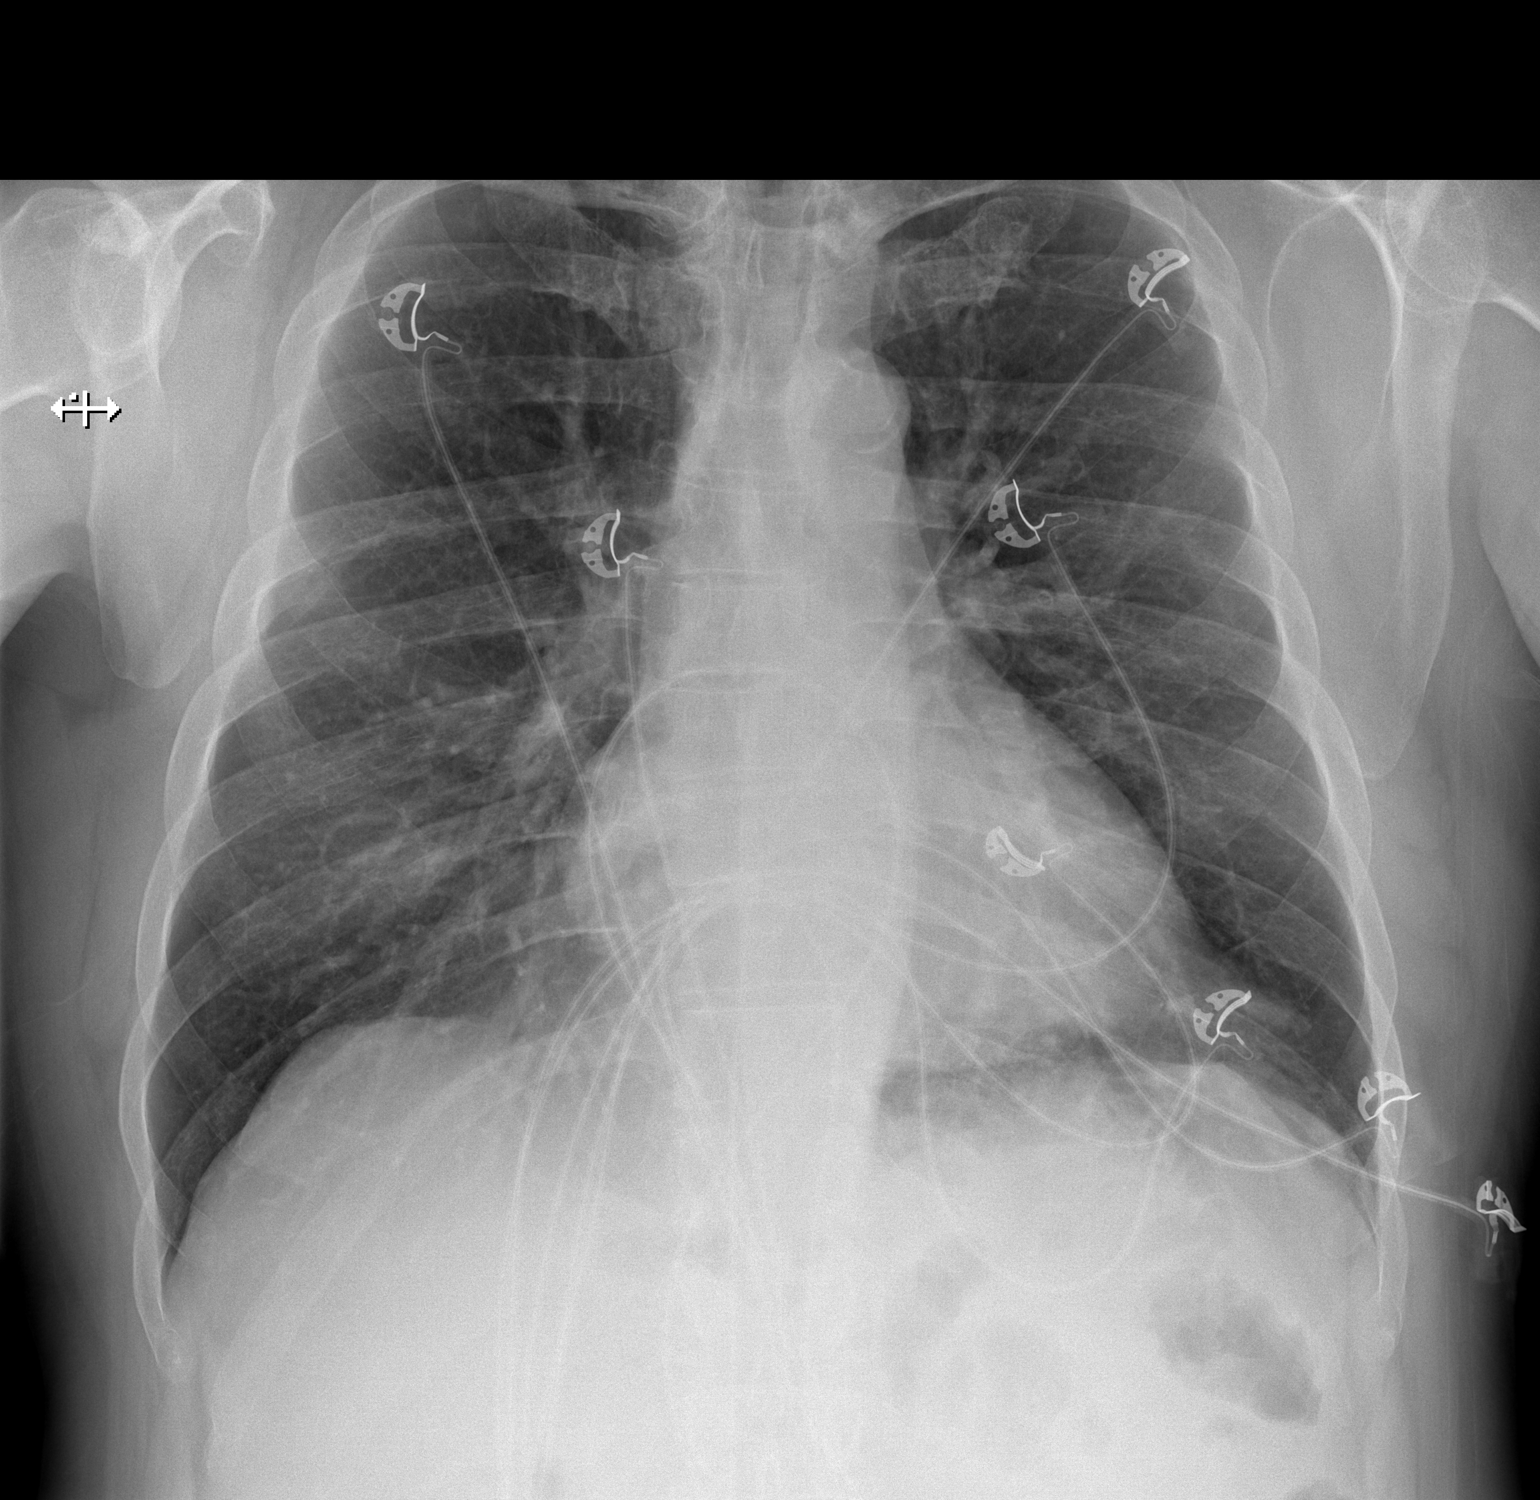

[w chest lat]
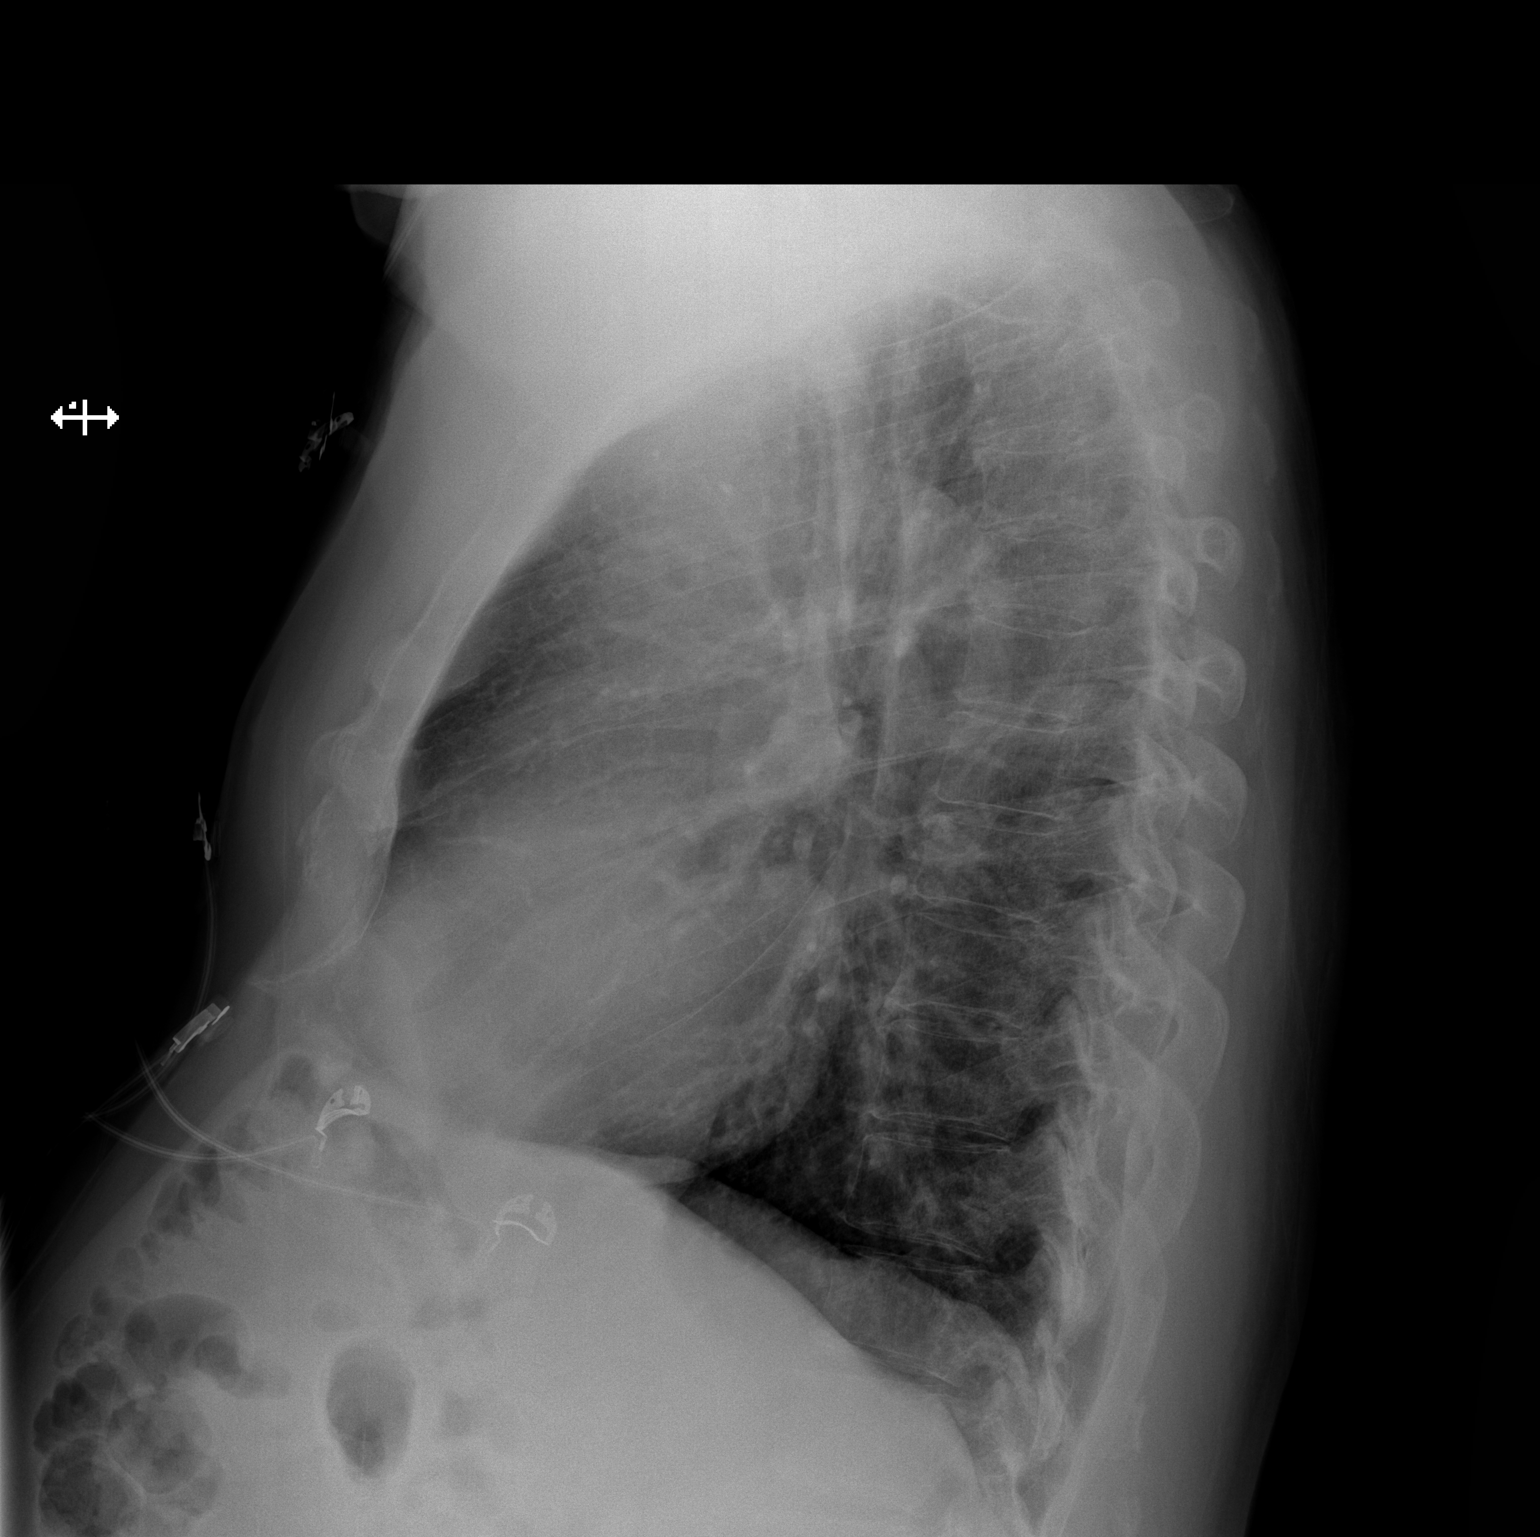

[2 of 2 positions shown; findings below may reference images not displayed]

FINDINGS: Chronic interstitial markings.  No focal consolidation.
No pleural effusion or pneumothorax.

Heart is top normal in size.

Mild degenerative changes of the visualized thoracolumbar spine.
IMPRESSION: No evidence of acute cardiopulmonary disease.

## 2012-07-19 MED ORDER — MORPHINE SULFATE 4 MG/ML IJ SOLN
4.0000 mg | Freq: Once | INTRAMUSCULAR | Status: AC
  Administered 2012-07-19: 4 mg via INTRAVENOUS
  Filled 2012-07-19: qty 1

## 2012-07-19 MED ORDER — ASPIRIN 325 MG PO TABS
325.0000 mg | ORAL_TABLET | Freq: Once | ORAL | Status: AC
  Administered 2012-07-19: 325 mg via ORAL
  Filled 2012-07-19: qty 1

## 2012-07-19 NOTE — ED Notes (Signed)
Pt alert, arrives from home, c/o "severe pain in my left rib cage", describes as an ache, constant,  non radiating, onset was this evening, denies sob, resp even unlabored, skin pwd

## 2012-07-19 NOTE — ED Notes (Signed)
Pt. Claimed that he fell yesterday and landed on his butt ut denied hitting on a hard surface. Also denied LOC.

## 2012-07-19 NOTE — ED Provider Notes (Signed)
History     CSN: [id number]  Arrival date & time 07/19/12  0521   First MD Initiated Contact with Patient 07/19/12 [phone]      Chief Complaint  Patient presents with  . Chest Pain    Left rib    (Consider location/radiation/quality/duration/timing/severity/associated sxs/prior treatment) HPI Alexander Duran is a 67 year old male with a past medical history of neck cancer hypertension, and a 30-pack-year smoking history who presents the emergency department with chief complaint of left sided chest pain.  Patient states he was awoken from sleep at 1:30 AM this morning with left rib cage pain.  He rates the pain at 8/10.  It is constant.  He states it radiates around to his back.  He denies any chest pressure, shortness of breath, nausea, vomiting, diaphoresis.  Patient denies a history of GERD.  Patient has a family history significant for mother had MI after the age of 49. He has been under significant stress as he is taking care of his mother more than 20 hours a day every day. He has no other complaints at this time. Past Medical History  Diagnosis Date  . Hypertension   . Thyroid disease   . Cancer     Past Surgical History  Procedure Date  . Tumor removal     in neck  . Back surgery     No family history on file.  History  Substance Use Topics  . Smoking status: Never Smoker   . Smokeless tobacco: Not on file  . Alcohol Use: Yes     Comment: rarely      Review of Systems Ten systems reviewed and are negative for acute change, except as noted in the HPI.   Allergies  Review of patient's allergies indicates no known allergies.  Home Medications   Current Outpatient Rx  Name  Route  Sig  Dispense  Refill  . HYDROCODONE-ACETAMINOPHEN 5-500 MG PO TABS   Oral   Take 0.5-1 tablets by mouth every 6 (six) hours as needed. pain         . LEVOTHYROXINE SODIUM 75 MCG PO TABS   Oral   Take 75 mcg by mouth daily.           BP 177/91  Pulse 76  Temp 98.7 F  (37.1 C) (Oral)  Resp 18  SpO2 100%  Physical Exam Physical Exam  Nursing note and vitals reviewed. Constitutional: He appears well-developed and well-nourished. No distress.  HENT:  Head: Normocephalic and atraumatic.  Eyes: Conjunctivae normal are normal. No scleral icterus.  Neck: Normal range of motion. Neck supple. Surgical scars consistent with tumor removal. Cardiovascular: Normal rate, regular rhythm and normal heart sounds.   Pulmonary/Chest: Effort normal and breath sounds normal. No respiratory distress.  Abdominal: Soft. There is no tenderness.  Musculoskeletal: He exhibits no edema.  Neurological: He is alert.  Skin: Skin is warm and dry. He is not diaphoretic.  Psychiatric: His behavior is normal.    ED Course  Procedures (including critical care time)   Labs Reviewed  CBC  BASIC METABOLIC PANEL  TROPONIN I    Date: 07/19/2012  Rate: 74  Rhythm: normal sinus rhythm  QRS Axis: normal  Intervals: normal  ST/T Wave abnormalities: normal  Conduction Disutrbances:right bundle branch block  Narrative Interpretation:   Old EKG Reviewed: unchanged   No results found.   No diagnosis found.    MDM  6:53 AM BP 177/91  Pulse 76  Temp 98.7 F (37.1 C) (  Oral)  Resp 18  SpO2 100% Patient with rf for ACS.  I do not suspect PE as no HX/ distant cancer hx. No sob. Hemoptysis, no lateral leg swelling or recent history of confinement.  Pain is not reproducible with palpation or movement of the arm.  Pain is atypical for ACS. No cough or sxs of URI.  7:48 AM I spoke with the patient about his labs and plan of care.  He admits that he is worried about cancer as he has not had any follow up.    9:40 AM Patient with 2 negative Troponins Labs negative/cxr negative.  Sxs resolved.  Patient is to be discharged with recommendation to follow up with PCP/Cardiology in regards to today's hospital visit. Chest pain is not likely of cardiac or pulmonary etiology d/t  presentation, perc negative, VSS, no tracheal deviation, no JVD or new murmur, RRR, breath sounds equal bilaterally, EKG without acute abnormalities, negative troponin, and negative CXR. Pt has been advised start a PPI and return to the ED is CP becomes exertional, associated with diaphoresis or nausea, radiates to left jaw/arm, worsens or becomes concerning in any way. Pt appears reliable for follow up and is agreeable to discharge.   Case has been discussed with and seen by Dr. Patria Mane who agrees with the above plan to discharge.          Arthor Captain, PA-C 07/21/12 [phone]

## 2012-07-22 NOTE — ED Provider Notes (Signed)
Medical screening examination/treatment/procedure(s) were performed by non-physician practitioner and as supervising physician I was immediately available for consultation/collaboration.   Lyanne Co, MD 07/22/12 [phone]

## 2013-05-03 ENCOUNTER — Telehealth: Payer: Self-pay | Admitting: Hematology and Oncology

## 2013-05-03 NOTE — Telephone Encounter (Signed)
C/D 05/03/13 for appt. 05/04/13

## 2013-05-04 ENCOUNTER — Telehealth: Payer: Self-pay | Admitting: Hematology and Oncology

## 2013-05-04 ENCOUNTER — Ambulatory Visit: Payer: Non-veteran care

## 2013-05-04 ENCOUNTER — Encounter: Payer: Self-pay | Admitting: Hematology and Oncology

## 2013-05-04 ENCOUNTER — Ambulatory Visit (HOSPITAL_BASED_OUTPATIENT_CLINIC_OR_DEPARTMENT_OTHER): Payer: Non-veteran care | Admitting: Hematology and Oncology

## 2013-05-04 VITALS — BP 155/86 | HR 73 | Temp 97.8°F | Resp 20 | Ht 68.0 in | Wt 211.3 lb

## 2013-05-04 DIAGNOSIS — C099 Malignant neoplasm of tonsil, unspecified: Secondary | ICD-10-CM

## 2013-05-04 DIAGNOSIS — I10 Essential (primary) hypertension: Secondary | ICD-10-CM

## 2013-05-04 DIAGNOSIS — E890 Postprocedural hypothyroidism: Secondary | ICD-10-CM

## 2013-05-04 NOTE — Telephone Encounter (Signed)
s/w pt re appt for 05/03/14. per relative called pt @ [phone]. number added to demographics

## 2013-05-04 NOTE — Progress Notes (Signed)
[facility] CONSULT NOTE  Patient has no care team.  CHIEF COMPLAINTS/PURPOSE OF CONSULTATION:  History of tonsil cancer, T1, N2, M0  HISTORY OF PRESENTING ILLNESS:  Alexander Duran 67 y.o. male is here because of history of squamous cell carcinoma of the right tonsil. I reviewed his records extensively and collaborated the history with the patient. Summary of his oncology history is as follows Oncology History   Tonsil cancer   Primary site: Pharynx - Oropharynx (Right)   Staging method: AJCC 7th Edition   Clinical: Stage IVA (T1, N2b, M0) signed by Artis Delay, MD on 05/04/2013 12:26 PM   Pathologic: Stage IVA (T1, N2b, cM0) signed by Artis Delay, MD on 05/04/2013 12:26 PM   Summary: Stage IVA (T1, N2b, cM0)       Tonsil cancer   07/30/2001 Imaging US neck showed a mass 3.5 cm   08/06/2001 Imaging Ct scan neck confirmed large mass in the neck   08/12/2001 Procedure FNA of neck mass was non-diagnostic   09/09/2001 Surgery Dr. Lazarus Salines performed neck exploration, dissection, panendoscopy and tonsillectomy    Radiation Therapy he completed adjuvant RT without chemotherapy   The patient never received chemotherapy as he declined. After he completed radiation therapy, he had regular followup at the radiation department and ENT but after 5 years of remission, he was lost to followup in the oncology Department The patient denies history of abnormal neck masses.  No changes in his voice recently. He still has swallowing difficulties due to history of radiation. He has some intermittent neck pain due to difficulties turning his head at times. He also had persistent altered taste sensation and dry mouth. He denies any recent weight loss.  MEDICAL HISTORY:  Past Medical History  Diagnosis Date  . Hypertension   . Thyroid disease   . Cancer     Tonsil cancer    SURGICAL HISTORY: Past Surgical History  Procedure Laterality Date  . Tumor removal      in neck  . Back  surgery      SOCIAL HISTORY: History   Social History  . Marital Status: Married    Spouse Name: N/A    Number of Children: N/A  . Years of Education: N/A   Occupational History  . Not on file.   Social History Main Topics  . Smoking status: Former Smoker -- 1.00 packs/day for 25 years    Quit date: 05/04/2001  . Smokeless tobacco: Never Used  . Alcohol Use: 1.2 oz/week    2 Shots of liquor per week     Comment: rarely  . Drug Use: No  . Sexual Activity: Not on file   Other Topics Concern  . Not on file   Social History Narrative  . No narrative on file    FAMILY HISTORY: Family History  Problem Relation Age of Onset  . Cancer Father     lung cancer    ALLERGIES:  has No Known Allergies.  MEDICATIONS:  Current Outpatient Prescriptions  Medication Sig Dispense Refill  . HYDROcodone-acetaminophen (VICODIN) 5-500 MG per tablet Take 0.5-1 tablets by mouth every 6 (six) hours as needed. pain      . levothyroxine (SYNTHROID, LEVOTHROID) 75 MCG tablet Take 75 mcg by mouth daily.       No current facility-administered medications for this visit.    REVIEW OF SYSTEMS:   Constitutional: Denies fevers, chills or abnormal night sweats Eyes: Denies blurriness of vision, double vision or watery eyes Respiratory:  Denies cough, dyspnea or wheezes Cardiovascular: Denies palpitation, chest discomfort or lower extremity swelling Gastrointestinal:  Denies nausea, heartburn or change in bowel habits Skin: Denies abnormal skin rashes Lymphatics: Denies new lymphadenopathy or easy bruising Neurological:Denies numbness, tingling or new weaknesses Behavioral/Psych: Mood is stable, no new changes  All other systems were reviewed with the patient and are negative.  PHYSICAL EXAMINATION: ECOG PERFORMANCE STATUS: 1 - Symptomatic but completely ambulatory  Filed Vitals:   05/04/13 1024  BP: 155/86  Pulse: 73  Temp: 97.8 F (36.6 C)  Resp: 20   Filed Weights   05/04/13 1024   Weight: 211 lb 4.8 oz (95.845 kg)    GENERAL:alert, no distress and comfortable SKIN: skin color, texture, turgor are normal, no rashes or significant lesions EYES: normal, conjunctiva are pink and non-injected, sclera clear OROPHARYNX:no exudate, no erythema and lips, buccal mucosa, and tongue normal  NECK: Noted radiation induced skin fibrosis. LYMPH:  no palpable lymphadenopathy in the cervical, axillary or inguinal LUNGS: clear to auscultation and percussion with normal breathing effort HEART: regular rate & rhythm and no murmurs and no lower extremity edema ABDOMEN:abdomen soft, non-tender and normal bowel sounds Musculoskeletal:no cyanosis of digits and no clubbing  PSYCH: alert & oriented x 3 with fluent speech NEURO: no focal motor/sensory deficits  LABORATORY DATA:  I have reviewed the data as listed  ASSESSMENT:  History of squamous cell carcinoma of the right tonsil, no evidence of disease   PLAN:  #1 squamous cell carcinoma of the right tonsil He is currently more than 10 years out. Nevertheless, he would be a risk of cancer recurrence. I will continue to see him here with history and physical examination only once a year and defer imaging study unless we have clinical suspicion of disease recurrence. I educated the patient to watch for signs and symptoms of disease recurrence such as recurrence of neck masses, change in his voice or unexplained weight loss. #2 history of smoking He would need regular chest x-ray monitoring at least once a year to rule out lung cancer due to history of his heavy smoking #3 dry mouth This is tolerable per patient. 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 He will continue thyroid function test monitoring per primary Provider #5 intermittent neck pain This is due to restricted movement from prior surgery and radiation changes. He is currently being prescribed chronic narcotic  therapy for neck pain All questions were answered. The patient knows to call the clinic with any problems, questions or concerns.    Rees Matura, MD 05/04/2013 1:05 PM

## 2013-05-04 NOTE — Progress Notes (Signed)
Checked in new pt with no financial concerns. °

## 2014-05-02 ENCOUNTER — Telehealth: Payer: Self-pay | Admitting: Hematology and Oncology

## 2014-05-02 NOTE — Telephone Encounter (Signed)
returned pt call and r/s appt per pt request...done...pt ok adn aware

## 2014-05-03 ENCOUNTER — Ambulatory Visit: Payer: Non-veteran care | Admitting: Hematology and Oncology

## 2014-05-09 ENCOUNTER — Telehealth: Payer: Self-pay | Admitting: Hematology and Oncology

## 2014-05-09 ENCOUNTER — Encounter: Payer: Self-pay | Admitting: Hematology and Oncology

## 2014-05-09 ENCOUNTER — Ambulatory Visit (HOSPITAL_BASED_OUTPATIENT_CLINIC_OR_DEPARTMENT_OTHER): Payer: Non-veteran care

## 2014-05-09 ENCOUNTER — Ambulatory Visit (HOSPITAL_BASED_OUTPATIENT_CLINIC_OR_DEPARTMENT_OTHER): Payer: Non-veteran care | Admitting: Hematology and Oncology

## 2014-05-09 VITALS — BP 171/82 | HR 80 | Temp 98.5°F | Resp 19 | Ht 68.0 in | Wt 198.6 lb

## 2014-05-09 DIAGNOSIS — C099 Malignant neoplasm of tonsil, unspecified: Secondary | ICD-10-CM

## 2014-05-09 DIAGNOSIS — E039 Hypothyroidism, unspecified: Secondary | ICD-10-CM

## 2014-05-09 DIAGNOSIS — I10 Essential (primary) hypertension: Secondary | ICD-10-CM | POA: Diagnosis not present

## 2014-05-09 DIAGNOSIS — E038 Other specified hypothyroidism: Secondary | ICD-10-CM

## 2014-05-09 HISTORY — DX: Hypothyroidism, unspecified: E03.9

## 2014-05-09 LAB — CBC WITH DIFFERENTIAL/PLATELET
BASO%: 0.6 % (ref 0.0–2.0)
Basophils Absolute: 0 10*3/uL (ref 0.0–0.1)
EOS%: 2.4 % (ref 0.0–7.0)
Eosinophils Absolute: 0.1 10*3/uL (ref 0.0–0.5)
HEMATOCRIT: 42.9 % (ref 38.4–49.9)
HGB: 14.8 g/dL (ref 13.0–17.1)
LYMPH#: 1.2 10*3/uL (ref 0.9–3.3)
LYMPH%: 22.7 % (ref 14.0–49.0)
MCH: 32.4 pg (ref 27.2–33.4)
MCHC: 34.5 g/dL (ref 32.0–36.0)
MCV: 93.9 fL (ref 79.3–98.0)
MONO#: 0.6 10*3/uL (ref 0.1–0.9)
MONO%: 10.5 % (ref 0.0–14.0)
NEUT#: 3.4 10*3/uL (ref 1.5–6.5)
NEUT%: 63.8 % (ref 39.0–75.0)
Platelets: 253 10*3/uL (ref 140–400)
RBC: 4.57 10*6/uL (ref 4.20–5.82)
RDW: 13.1 % (ref 11.0–14.6)
WBC: 5.3 10*3/uL (ref 4.0–10.3)

## 2014-05-09 LAB — COMPREHENSIVE METABOLIC PANEL (CC13)
ALBUMIN: 4.3 g/dL (ref 3.5–5.0)
ALT: 24 U/L (ref 0–55)
ANION GAP: 10 meq/L (ref 3–11)
AST: 32 U/L (ref 5–34)
Alkaline Phosphatase: 69 U/L (ref 40–150)
BILIRUBIN TOTAL: 0.33 mg/dL (ref 0.20–1.20)
BUN: 11.8 mg/dL (ref 7.0–26.0)
CALCIUM: 9.8 mg/dL (ref 8.4–10.4)
CHLORIDE: 104 meq/L (ref 98–109)
CO2: 27 meq/L (ref 22–29)
Creatinine: 0.9 mg/dL (ref 0.7–1.3)
GLUCOSE: 97 mg/dL (ref 70–140)
POTASSIUM: 4.3 meq/L (ref 3.5–5.1)
SODIUM: 142 meq/L (ref 136–145)
TOTAL PROTEIN: 7.1 g/dL (ref 6.4–8.3)

## 2014-05-09 LAB — T4, FREE: FREE T4: 1.17 ng/dL (ref 0.80–1.80)

## 2014-05-09 NOTE — Assessment & Plan Note (Signed)
His blood pressure is high which he attributed to stress. We will recheck it again in 2 weeks and if they remain high, he will need to be placed on new medications for high blood pressure.

## 2014-05-09 NOTE — Assessment & Plan Note (Signed)
He is on chronic replacement therapy. I will recheck his thyroid function tests today.

## 2014-05-09 NOTE — Telephone Encounter (Signed)
Gave avs & cal. °

## 2014-05-09 NOTE — Assessment & Plan Note (Signed)
The patient had over 20 pound weight loss in the past 6 months along with some sensation of sore throat. I'm concerned about disease recurrence. I will order blood work and imaging study and see him back in 2 weeks.

## 2014-05-09 NOTE — Progress Notes (Signed)
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 Erik Duran performed neck exploration, dissection, panendoscopy and tonsillectomy    Radiation Therapy he completed adjuvant RT without chemotherapy    INTERVAL HISTORY: Please see below for problem oriented charting. I have not seen him for year. He has lost over 23 pounds in 6 months. He attributed to some of the weight loss with dietary modification and a lot of stress. He complained of sensation of sore throat over the past few months with difficulties with swallowing. He also complained of left-sided hearing loss since radiation treatment.  REVIEW OF SYSTEMS:   Constitutional: Denies fevers, chills  Eyes: Denies blurriness of vision Ears, nose, mouth, throat, and face: Denies mucositis  Respiratory: Denies cough, dyspnea or wheezes Cardiovascular: Denies palpitation, chest discomfort or lower extremity swelling Gastrointestinal:  Denies nausea, heartburn or change in bowel habits Skin: Denies abnormal skin rashes Lymphatics: Denies new lymphadenopathy or easy bruising Neurological:Denies numbness, tingling or new weaknesses Behavioral/Psych: Mood is stable, no new changes  All other systems were reviewed with the patient and are negative.  I have reviewed the past medical history,  past surgical history, social history and family history with the patient and they are unchanged from previous note.  ALLERGIES:  has No Known Allergies.  MEDICATIONS:  Current Outpatient Prescriptions  Medication Sig Dispense Refill  . HYDROcodone-acetaminophen (NORCO/VICODIN) 5-325 MG per tablet Take 1 tablet by mouth every 6 (six) hours as needed for moderate pain.    Marland Kitchen levothyroxine (SYNTHROID, LEVOTHROID) 75 MCG tablet Take 75 mcg by mouth daily.     No current facility-administered medications for this visit.    PHYSICAL EXAMINATION: ECOG PERFORMANCE STATUS: 1 - Symptomatic but completely ambulatory  Filed Vitals:   05/09/14 1322  BP: 171/82  Pulse: 80  Temp: 98.5 F (36.9 C)  Resp: 19   Filed Weights   05/09/14 1322  Weight: 198 lb 9.6 oz (90.084 kg)    GENERAL:alert, no distress and comfortable SKIN: skin color, texture, turgor are normal, no rashes or significant lesions EYES: normal, Conjunctiva are pink and non-injected, sclera clear OROPHARYNX:no exudate, no erythema and lips, buccal mucosa, and tongue normal  NECK: Neck is woody from prior radiation treatment, thyroid normal size, non-tender, without nodularity LYMPH:  no palpable lymphadenopathy in the cervical, axillary or inguinal LUNGS: clear to auscultation and percussion with normal breathing effort HEART: regular rate & rhythm and no murmurs and no lower extremity edema ABDOMEN:abdomen soft, non-tender and normal bowel sounds Musculoskeletal:no cyanosis of digits and no clubbing  NEURO: alert & oriented x 3 with fluent speech, no focal motor/sensory deficits. He appeared depressed.  LABORATORY DATA:  I have reviewed the data as listed    Component Value Date/Time   NA 133* 07/19/2012 5956  K 4.2 07/19/2012 0628   CL 99 07/19/2012 0628   CO2 23 07/19/2012 0628   GLUCOSE 112* 07/19/2012 0628   BUN 12 07/19/2012 0628   CREATININE 0.69 07/19/2012 0628   CALCIUM 9.0 07/19/2012 0628   PROT 6.9  07/19/2012 0812   ALBUMIN 4.0 07/19/2012 0812   AST 34 07/19/2012 0812   ALT 29 07/19/2012 0812   ALKPHOS 64 07/19/2012 0812   BILITOT 0.4 07/19/2012 0812   GFRNONAA >90 07/19/2012 0628   GFRAA >90 07/19/2012 0628    No results found for: SPEP, UPEP  Lab Results  Component Value Date   WBC 5.3 05/09/2014   NEUTROABS 3.4 05/09/2014   HGB 14.8 05/09/2014   HCT 42.9 05/09/2014   MCV 93.9 05/09/2014   PLT 253 05/09/2014      Chemistry      Component Value Date/Time   NA 133* 07/19/2012 0628   K 4.2 07/19/2012 0628   CL 99 07/19/2012 0628   CO2 23 07/19/2012 0628   BUN 12 07/19/2012 0628   CREATININE 0.69 07/19/2012 0628      Component Value Date/Time   CALCIUM 9.0 07/19/2012 0628   ALKPHOS 64 07/19/2012 0812   AST 34 07/19/2012 0812   ALT 29 07/19/2012 0812   BILITOT 0.4 07/19/2012 0812      ASSESSMENT & PLAN:  Tonsil cancer The patient had over 20 pound weight loss in the past 6 months along with some sensation of sore throat. I'm concerned about disease recurrence. I will order blood work and imaging study and see him back in 2 weeks.  Hypothyroidism He is on chronic replacement therapy. I will recheck his thyroid function tests today.  Essential hypertension His blood pressure is high which he attributed to stress. We will recheck it again in 2 weeks and if they remain high, he will need to be placed on new medications for high blood pressure.   Orders Placed This Encounter  Procedures  . CT Chest W Contrast    Standing Status: Future     Number of Occurrences:      Standing Expiration Date: 07/09/2015    Order Specific Question:  Reason for Exam (SYMPTOM  OR DIAGNOSIS REQUIRED)    Answer:  weight loss, tonsil cancer, exclude recurrence    Order Specific Question:  Preferred imaging location?    Answer:  [facility]  . CT Soft Tissue Neck W Contrast    Standing Status: Future     Number of Occurrences:      Standing Expiration Date: 08/09/2015     Order Specific Question:  Reason for Exam (SYMPTOM  OR DIAGNOSIS REQUIRED)    Answer:  weight loss, tonsil cancer, exclude recurrence    Order Specific Question:  Preferred imaging location?    Answer:  [facility]  . Comprehensive metabolic panel    Standing Status: Future     Number of Occurrences: 1     Standing Expiration Date: 06/13/2015  . CBC with Differential    Standing Status: Future     Number of Occurrences: 1     Standing Expiration Date: 06/13/2015  . TSH    Standing Status: Future     Number of Occurrences: 1     Standing Expiration Date: 06/13/2015  . T4, free    Standing Status: Future     Number of Occurrences: 1     Standing Expiration Date: 06/13/2015   All questions were answered. The patient knows to call  the clinic with any problems, questions or concerns. No barriers to learning was detected. I spent 30 minutes counseling the patient face to face. The total time spent in the appointment was 40 minutes and more than 50% was on counseling and review of test results     [facility], [place], MD 05/09/2014 2:39 PM

## 2014-05-10 LAB — TSH CHCC: TSH: 1.24 m(IU)/L (ref 0.320–4.118)

## 2014-05-11 ENCOUNTER — Encounter: Payer: Self-pay | Admitting: Hematology and Oncology

## 2014-05-11 ENCOUNTER — Telehealth: Payer: Self-pay | Admitting: Hematology and Oncology

## 2014-05-11 NOTE — Progress Notes (Unsigned)
05/11/2014 I spoke with Mr. Ewart and gave him the new date of his ct scans.  He voiced understanding concerning the [facility] Approval.  I will call the patient when I hear from the [place].  I submitted the request for additional services. Stanford Breed

## 2014-05-11 NOTE — Telephone Encounter (Signed)
s.w. pt and advised on DEC appt moved...done....pt ok and aware

## 2014-05-16 ENCOUNTER — Ambulatory Visit (HOSPITAL_COMMUNITY): Payer: Non-veteran care

## 2014-05-19 ENCOUNTER — Ambulatory Visit: Payer: Non-veteran care | Admitting: Hematology and Oncology

## 2014-05-19 ENCOUNTER — Other Ambulatory Visit: Payer: Non-veteran care

## 2014-05-24 ENCOUNTER — Encounter: Payer: Self-pay | Admitting: Hematology and Oncology

## 2014-05-24 NOTE — Progress Notes (Unsigned)
05/24/2014  After not hearing anything from the [place] , I called today and left a message for the supervisor to call me concerning the auths for this patient to have ct scans.  Stanford Breed

## 2014-05-25 ENCOUNTER — Encounter: Payer: Self-pay | Admitting: Hematology and Oncology

## 2014-05-25 NOTE — Progress Notes (Unsigned)
05/25/2014  I spoke with Alexander Duran this morning and he requested I cancel his ct scans because of no prior approval from the [place].  He will reschedule once authorization is obtained.  Benedetto Goad

## 2014-05-25 NOTE — Progress Notes (Unsigned)
05/25/2014  i have spoken with Mr. Fees again to inform him not to come in on 05/27/2017 because without the scans Dr. Alvy Bimler can not properly address his care.  I am going to start calling again in the AM .  Mr. Hagy was per our conversation out sourced to [place] in 2013 because of his cancer.  Hopefully I can this time speak with someone familiar with his case.  Benedetto Goad

## 2014-05-26 ENCOUNTER — Ambulatory Visit (HOSPITAL_COMMUNITY): Payer: Non-veteran care

## 2014-05-27 ENCOUNTER — Ambulatory Visit: Payer: Non-veteran care | Admitting: Hematology and Oncology

## 2014-05-27 ENCOUNTER — Encounter: Payer: Self-pay | Admitting: Hematology and Oncology

## 2014-05-27 ENCOUNTER — Telehealth: Payer: Self-pay

## 2014-05-27 ENCOUNTER — Other Ambulatory Visit: Payer: Non-veteran care

## 2014-05-27 NOTE — Progress Notes (Unsigned)
05/27/2014  I spoke with [name] and gave him the date and times for his lab, ct scans and follow up visit with Dr. Alvy Bimler.  The patient voiced understanding.  Alexander Duran

## 2014-06-01 ENCOUNTER — Encounter: Payer: Self-pay | Admitting: *Deleted

## 2014-06-03 ENCOUNTER — Ambulatory Visit (HOSPITAL_COMMUNITY)
Admission: RE | Admit: 2014-06-03 | Discharge: 2014-06-03 | Disposition: A | Discharge status: Home / Self Care | Payer: Non-veteran care | Source: Ambulatory Visit | Attending: Hematology and Oncology | Admitting: Hematology and Oncology

## 2014-06-03 ENCOUNTER — Other Ambulatory Visit: Payer: Non-veteran care

## 2014-06-03 ENCOUNTER — Encounter (HOSPITAL_COMMUNITY): Payer: Self-pay

## 2014-06-03 DIAGNOSIS — R634 Abnormal weight loss: Secondary | ICD-10-CM | POA: Insufficient documentation

## 2014-06-03 DIAGNOSIS — C099 Malignant neoplasm of tonsil, unspecified: Secondary | ICD-10-CM | POA: Diagnosis not present

## 2014-06-03 DIAGNOSIS — Z923 Personal history of irradiation: Secondary | ICD-10-CM | POA: Diagnosis not present

## 2014-06-03 IMAGING — CT CT NECK W/ CM
5 of 7 series · 18 of 33 positions shown, 19 images · IV contrast (OMNIPAQUE)
Comparison: None

CLINICAL DATA: Weight loss. History of tonsillar carcinoma
diagnosed in [1I] status post surgery and radiation therapy.

EXAM:
CT NECK WITH CONTRAST
TECHNIQUE: Multidetector CT imaging of the neck was performed using the
standard protocol following the bolus administration of intravenous
contrast.
CONTRAST:  100 mL Omnipaque 300

[Series 2: chest st · axial · 0.85mm/px · z∈[-555,-195]mm · 3 of 73 slices shown, 4 images]
[im 1/73  soft-tissue]
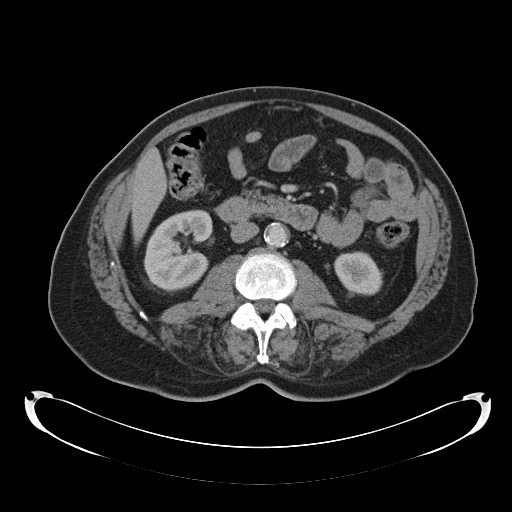
[im 1/73  bone]
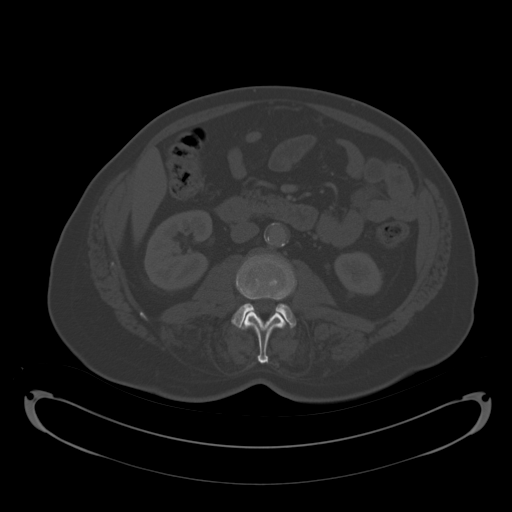
[im 37/73  bone]
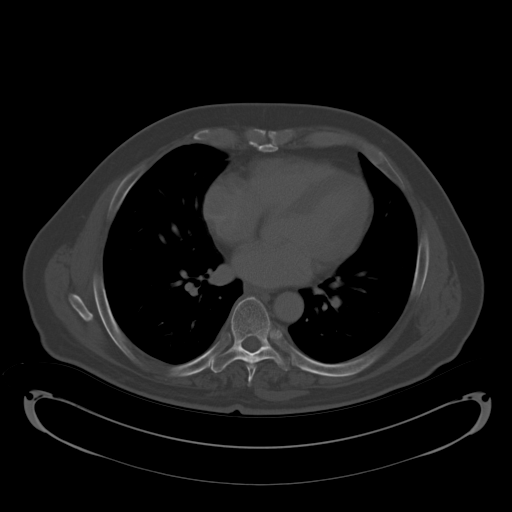
[im 73/73  bone]
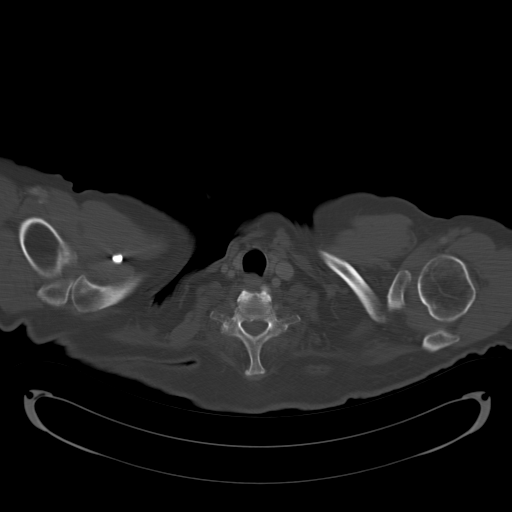

[Series 6: neck st · axial · 0.50mm/px · z∈[-225,-101]mm · 3 of 125 slices shown]
[im 32/125  bone]
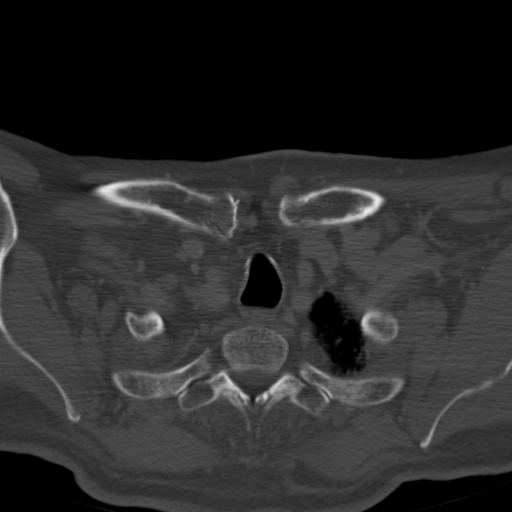
[im 63/125  bone]
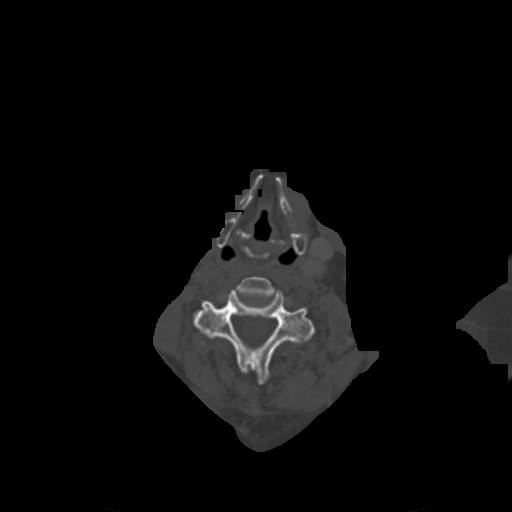
[im 94/125  bone]
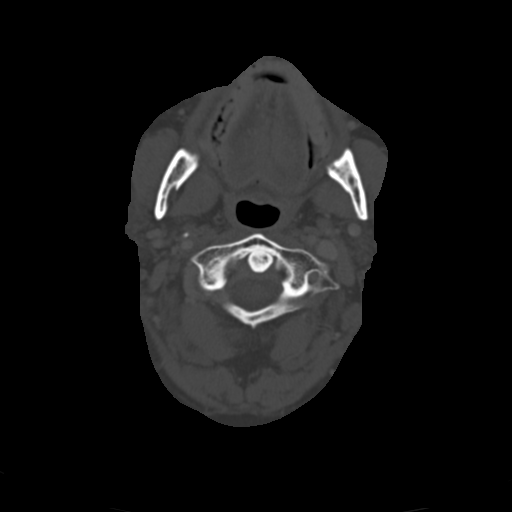

[Series 602: <mpr thick range> · coronal · 0.85mm/px · 3 of 94 slices shown]
[im 24/94  bone]
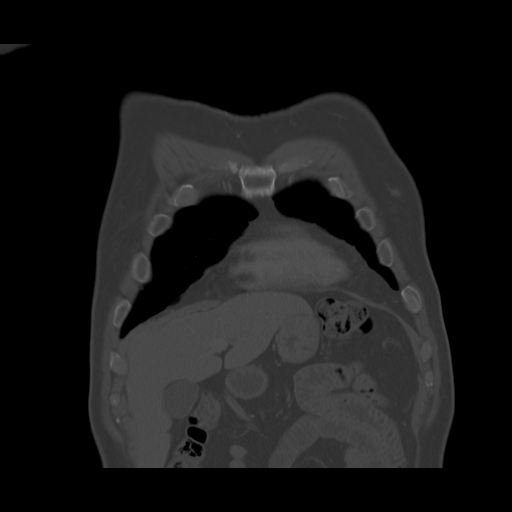
[im 47/94  bone]
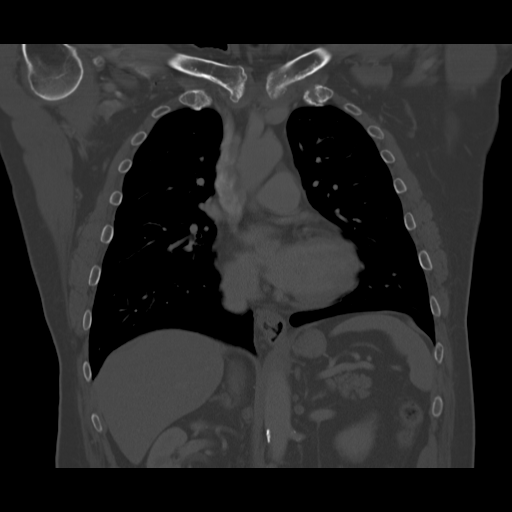
[im 70/94  bone]
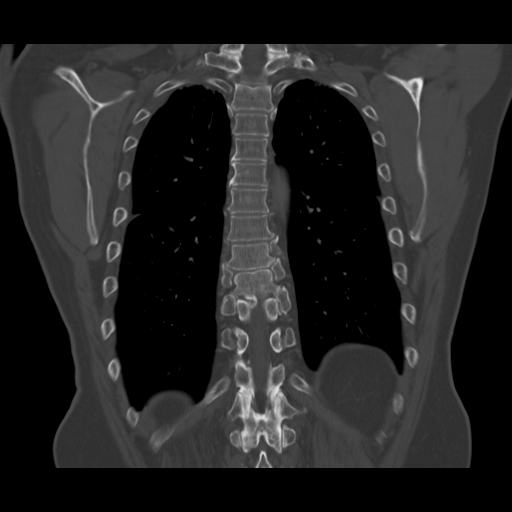

[Series 603: <mpr thick range(1)> · sagittal · 0.85mm/px · 5 of 133 slices shown]
[im 19/133  bone]
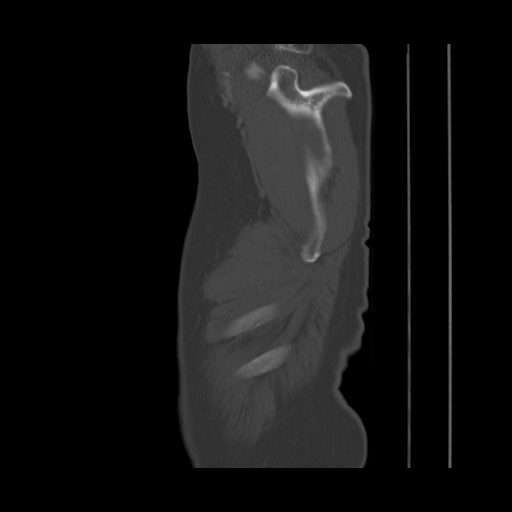
[im 38/133  bone]
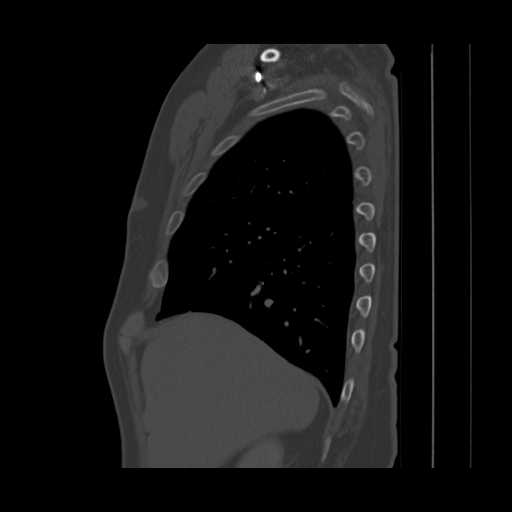
[im 57/133  bone]
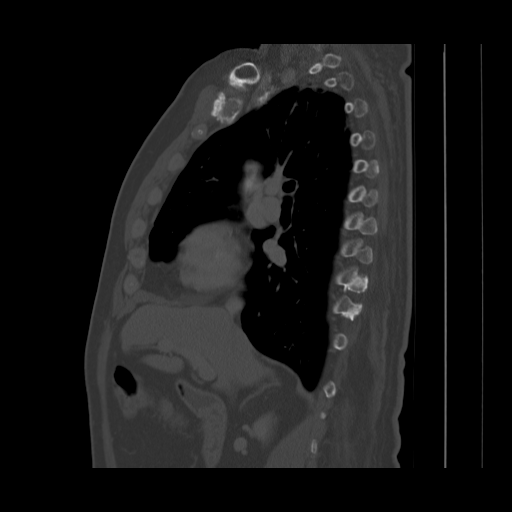
[im 76/133  bone]
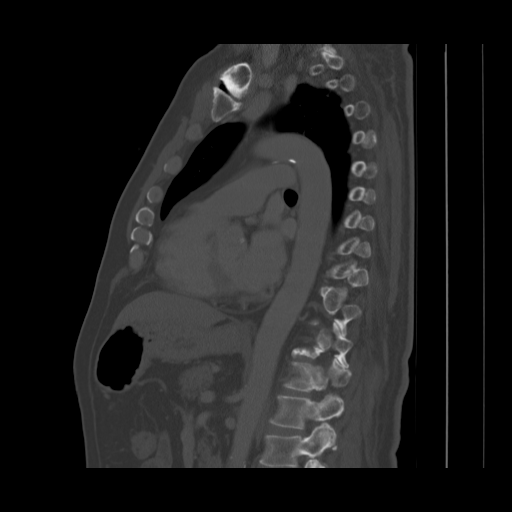
[im 95/133  bone]
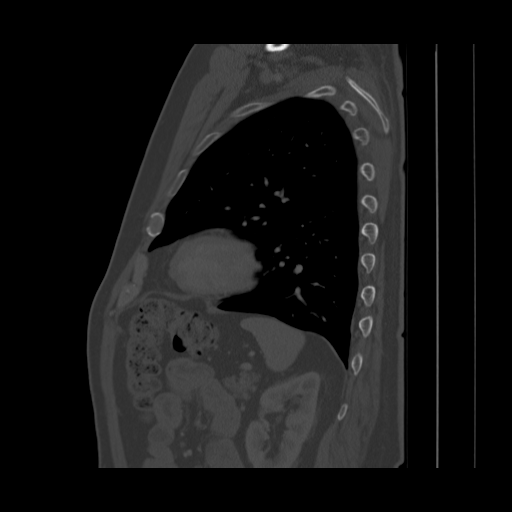

[Series 606: <mpr thick range(4)> · axial · 0.50mm/px · z∈[-287,-114]mm · 4 of 149 slices shown]
[im 30/149  bone]
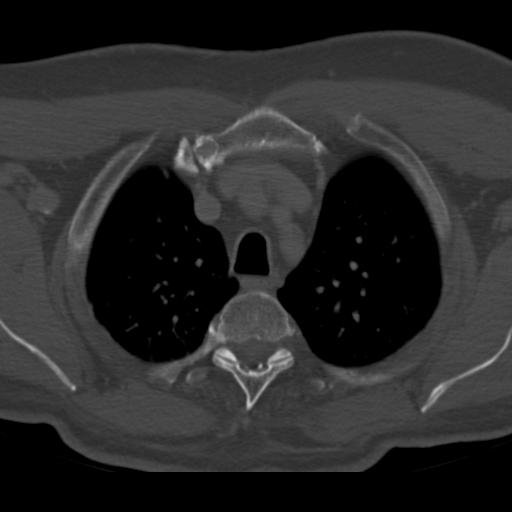
[im 60/149  bone]
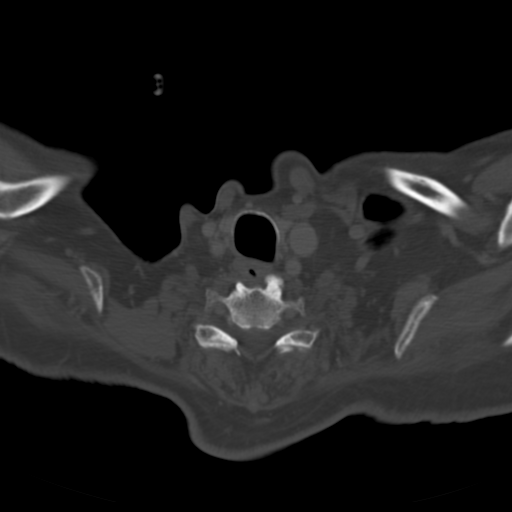
[im 89/149  bone]
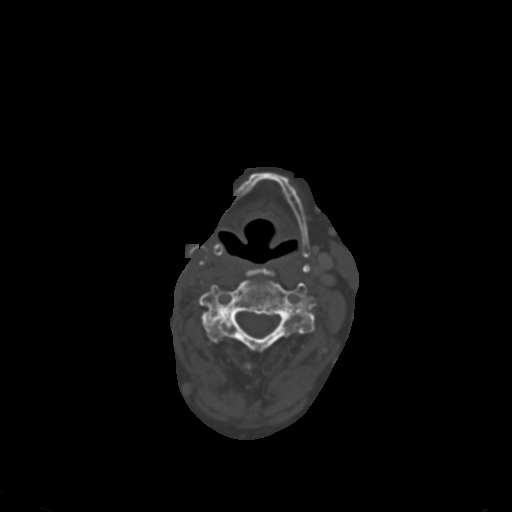
[im 119/149  bone]
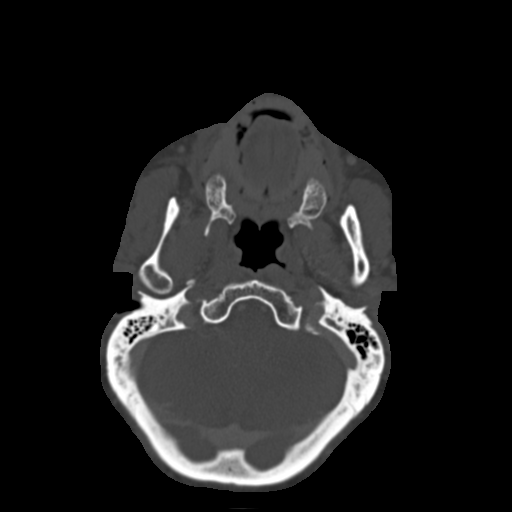

[18 of 33 positions shown; findings below may reference images not displayed]

FINDINGS: The visualized portions of the brain and orbits are unremarkable.
Visualized paranasal sinuses and mastoid air cells are clear.

There is slight asymmetry of the right nasopharynx without a
discrete mass seen. No masses identified in the oropharynx or oral
cavity. Slight thickening of the epiglottis may reflect prior
radiation therapy. Larynx is unremarkable. Thyroid is small without
sizable nodules identified.

Postoperative changes are seen in the right neck. No parotid mass is
identified. Left submandibular gland is small. Right submandibular
gland is likely surgically absent. Soft tissue thickening in the
right neck is likely postsurgical. No enlarged lymph nodes are
identified.

Right internal carotid artery is occluded at its origin. Lungs and
mediastinum are evaluated on separate chest CT. Moderate, diffuse
cervical spondylosis is present. There is bilateral facet ankylosis
at C2-3. No lytic or blastic osseous lesions are identified.
IMPRESSION: Posttherapy changes in the neck without evidence of recurrent
disease.

## 2014-06-03 MED ORDER — IOHEXOL 300 MG/ML  SOLN: 100.0000 mL | Freq: Once | INTRAMUSCULAR | Status: AC | PRN

## 2014-06-13 ENCOUNTER — Ambulatory Visit (HOSPITAL_BASED_OUTPATIENT_CLINIC_OR_DEPARTMENT_OTHER): Payer: Non-veteran care | Admitting: Hematology and Oncology

## 2014-06-13 ENCOUNTER — Encounter: Payer: Self-pay | Admitting: Hematology and Oncology

## 2014-06-13 VITALS — BP 143/76 | HR 63 | Temp 98.4°F | Resp 18 | Ht 68.0 in | Wt 202.5 lb

## 2014-06-13 DIAGNOSIS — C099 Malignant neoplasm of tonsil, unspecified: Secondary | ICD-10-CM

## 2014-06-14 ENCOUNTER — Encounter: Payer: Self-pay | Admitting: Hematology and Oncology

## 2014-06-14 NOTE — Assessment & Plan Note (Signed)
Thankfully, repeat imaging study did not show any signs of disease recurrence. His weight loss has resolved. I reassured the patient. I did not make return appointment for him to come back.

## 2014-06-14 NOTE — Progress Notes (Signed)
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 Erik Obey performed neck exploration, dissection, panendoscopy and tonsillectomy    Radiation Therapy he completed adjuvant RT without chemotherapy    INTERVAL HISTORY: Please see below for problem oriented charting. He returns to review test results. In the meantime, his weight loss issue has resolved. He denies new lymphadenopathy.  REVIEW OF SYSTEMS:   Constitutional: Denies fevers, chills or abnormal weight loss Eyes: Denies blurriness of vision Ears, nose, mouth, throat, and face: Denies mucositis or sore throat Respiratory: Denies cough, dyspnea or wheezes Cardiovascular: Denies palpitation, chest discomfort or lower extremity swelling Gastrointestinal:  Denies nausea, heartburn or change in bowel habits Skin: Denies abnormal skin rashes Lymphatics: Denies new lymphadenopathy or easy bruising Neurological:Denies numbness, tingling or new weaknesses Behavioral/Psych: Mood is stable, no new changes  All other systems were reviewed with the patient and are negative.  I have reviewed the past medical history, past surgical history, social history and family history with the patient and they are unchanged from previous  note.  ALLERGIES:  has No Known Allergies.  MEDICATIONS:  Current Outpatient Prescriptions  Medication Sig Dispense Refill  . HYDROcodone-acetaminophen (NORCO/VICODIN) 5-325 MG per tablet Take 1 tablet by mouth every 6 (six) hours as needed for moderate pain.    Marland Kitchen levothyroxine (SYNTHROID, LEVOTHROID) 75 MCG tablet Take 75 mcg by mouth daily.     No current facility-administered medications for this visit.    PHYSICAL EXAMINATION: ECOG PERFORMANCE STATUS: 0 - Asymptomatic  Filed Vitals:   06/13/14 0925  BP: 143/76  Pulse: 63  Temp: 98.4 F (36.9 C)  Resp: 18   Filed Weights   06/13/14 0925  Weight: 202 lb 8 oz (91.853 kg)    GENERAL:alert, no distress and comfortable SKIN: skin color, texture, turgor are normal, no rashes or significant lesions EYES: normal, Conjunctiva are pink and non-injected, sclera clear Musculoskeletal:no cyanosis of digits and no clubbing  NEURO: alert & oriented x 3 with fluent speech, no focal motor/sensory deficits  LABORATORY DATA:  I have reviewed the data as listed    Component Value Date/Time   NA 142 05/09/2014 1424   NA 133* 07/19/2012 0628   K 4.3 05/09/2014 1424   K 4.2 07/19/2012 0628   CL 99 07/19/2012 0628   CO2 27 05/09/2014 1424   CO2 23 07/19/2012 0628   GLUCOSE 97 05/09/2014 1424   GLUCOSE 112* 07/19/2012 0628   BUN 11.8 05/09/2014 1424   BUN 12 07/19/2012 0628   CREATININE 0.9 05/09/2014 1424   CREATININE 0.69 07/19/2012 0628   CALCIUM 9.8 05/09/2014 1424   CALCIUM 9.0 07/19/2012 0628   PROT 7.1 05/09/2014 1424   PROT 6.9 07/19/2012 6144  ALBUMIN 4.3 05/09/2014 1424   ALBUMIN 4.0 07/19/2012 0812   AST 32 05/09/2014 1424   AST 34 07/19/2012 0812   ALT 24 05/09/2014 1424   ALT 29 07/19/2012 0812   ALKPHOS 69 05/09/2014 1424   ALKPHOS 64 07/19/2012 0812   BILITOT 0.33 05/09/2014 1424   BILITOT 0.4 07/19/2012 0812   GFRNONAA >90 07/19/2012 0628   GFRAA >90 07/19/2012 0628    No results found for: SPEP,  UPEP  Lab Results  Component Value Date   WBC 5.3 05/09/2014   NEUTROABS 3.4 05/09/2014   HGB 14.8 05/09/2014   HCT 42.9 05/09/2014   MCV 93.9 05/09/2014   PLT 253 05/09/2014      Chemistry      Component Value Date/Time   NA 142 05/09/2014 1424   NA 133* 07/19/2012 0628   K 4.3 05/09/2014 1424   K 4.2 07/19/2012 0628   CL 99 07/19/2012 0628   CO2 27 05/09/2014 1424   CO2 23 07/19/2012 0628   BUN 11.8 05/09/2014 1424   BUN 12 07/19/2012 0628   CREATININE 0.9 05/09/2014 1424   CREATININE 0.69 07/19/2012 0628      Component Value Date/Time   CALCIUM 9.8 05/09/2014 1424   CALCIUM 9.0 07/19/2012 0628   ALKPHOS 69 05/09/2014 1424   ALKPHOS 64 07/19/2012 0812   AST 32 05/09/2014 1424   AST 34 07/19/2012 0812   ALT 24 05/09/2014 1424   ALT 29 07/19/2012 0812   BILITOT 0.33 05/09/2014 1424   BILITOT 0.4 07/19/2012 0812       RADIOGRAPHIC STUDIES: I reviewed the imaging study with the patient. I have personally reviewed the radiological images as listed and agreed with the findings in the report.   ASSESSMENT & PLAN:  Tonsil cancer Thankfully, repeat imaging study did not show any signs of disease recurrence. His weight loss has resolved. I reassured the patient. I did not make return appointment for him to come back.   No orders of the defined types were placed in this encounter.   All questions were answered. The patient knows to call the clinic with any problems, questions or concerns. No barriers to learning was detected. I spent 15 minutes counseling the patient face to face. The total time spent in the appointment was 20 minutes and more than 50% was on counseling and review of test results     [facility], [place], MD 06/14/2014 7:54 PM

## 2015-08-04 ENCOUNTER — Encounter (HOSPITAL_COMMUNITY): Payer: Self-pay | Admitting: Emergency Medicine

## 2015-08-04 ENCOUNTER — Emergency Department (HOSPITAL_COMMUNITY): Payer: Non-veteran care

## 2015-08-04 ENCOUNTER — Ambulatory Visit (INDEPENDENT_AMBULATORY_CARE_PROVIDER_SITE_OTHER): Payer: Non-veteran care | Admitting: Family Medicine

## 2015-08-04 ENCOUNTER — Emergency Department (HOSPITAL_COMMUNITY)
Admission: EM | Admit: 2015-08-04 | Discharge: 2015-08-04 | Disposition: A | Discharge status: Home / Self Care | Payer: Non-veteran care | Attending: Emergency Medicine | Admitting: Emergency Medicine

## 2015-08-04 VITALS — BP 194/98 | HR 65 | Temp 97.9°F | Resp 18 | Ht 68.0 in | Wt 179.0 lb

## 2015-08-04 DIAGNOSIS — Z79899 Other long term (current) drug therapy: Secondary | ICD-10-CM | POA: Diagnosis not present

## 2015-08-04 DIAGNOSIS — I160 Hypertensive urgency: Secondary | ICD-10-CM | POA: Insufficient documentation

## 2015-08-04 DIAGNOSIS — Z85818 Personal history of malignant neoplasm of other sites of lip, oral cavity, and pharynx: Secondary | ICD-10-CM | POA: Insufficient documentation

## 2015-08-04 DIAGNOSIS — Z638 Other specified problems related to primary support group: Secondary | ICD-10-CM

## 2015-08-04 DIAGNOSIS — R030 Elevated blood-pressure reading, without diagnosis of hypertension: Secondary | ICD-10-CM | POA: Diagnosis not present

## 2015-08-04 DIAGNOSIS — M539 Dorsopathy, unspecified: Secondary | ICD-10-CM | POA: Diagnosis not present

## 2015-08-04 DIAGNOSIS — R61 Generalized hyperhidrosis: Secondary | ICD-10-CM | POA: Diagnosis not present

## 2015-08-04 DIAGNOSIS — F439 Reaction to severe stress, unspecified: Secondary | ICD-10-CM

## 2015-08-04 DIAGNOSIS — R634 Abnormal weight loss: Secondary | ICD-10-CM | POA: Diagnosis not present

## 2015-08-04 DIAGNOSIS — R29898 Other symptoms and signs involving the musculoskeletal system: Secondary | ICD-10-CM

## 2015-08-04 DIAGNOSIS — IMO0001 Reserved for inherently not codable concepts without codable children: Secondary | ICD-10-CM

## 2015-08-04 DIAGNOSIS — E039 Hypothyroidism, unspecified: Secondary | ICD-10-CM | POA: Diagnosis not present

## 2015-08-04 DIAGNOSIS — I10 Essential (primary) hypertension: Secondary | ICD-10-CM | POA: Diagnosis present

## 2015-08-04 DIAGNOSIS — Z87891 Personal history of nicotine dependence: Secondary | ICD-10-CM | POA: Diagnosis not present

## 2015-08-04 DIAGNOSIS — Z85819 Personal history of malignant neoplasm of unspecified site of lip, oral cavity, and pharynx: Secondary | ICD-10-CM

## 2015-08-04 LAB — CBC
HEMATOCRIT: 42.3 % (ref 39.0–52.0)
HEMOGLOBIN: 14.1 g/dL (ref 13.0–17.0)
MCH: 32.2 pg (ref 26.0–34.0)
MCHC: 33.3 g/dL (ref 30.0–36.0)
MCV: 96.6 fL (ref 78.0–100.0)
Platelets: 263 10*3/uL (ref 150–400)
RBC: 4.38 MIL/uL (ref 4.22–5.81)
RDW: 13.1 % (ref 11.5–15.5)
WBC: 5.6 10*3/uL (ref 4.0–10.5)

## 2015-08-04 LAB — BASIC METABOLIC PANEL
ANION GAP: 9 (ref 5–15)
BUN: 13 mg/dL (ref 6–20)
CHLORIDE: 101 mmol/L (ref 101–111)
CO2: 25 mmol/L (ref 22–32)
Calcium: 9.1 mg/dL (ref 8.9–10.3)
Creatinine, Ser: 0.74 mg/dL (ref 0.61–1.24)
GFR calc Af Amer: >60 mL/min (ref >60)
GFR calc non Af Amer: >60 mL/min (ref >60)
Glucose, Bld: 105 mg/dL — ABNORMAL HIGH (ref 65–99)
Potassium: 4.3 mmol/L (ref 3.5–5.1)
SODIUM: 135 mmol/L (ref 135–145)

## 2015-08-04 LAB — I-STAT TROPONIN, ED: Troponin i, poc: 0 ng/mL (ref 0.00–0.08)

## 2015-08-04 LAB — GLUCOSE, POCT (MANUAL RESULT ENTRY): POC Glucose: 114 mg/dl — AB (ref 70–99)

## 2015-08-04 IMAGING — CR DG CHEST 2V
2 series · 2 of 2 positions shown · non-contrast
Comparison: [DATE].

CLINICAL DATA: Hypertension.

EXAM:
CHEST  2 VIEW

[w chest pa]
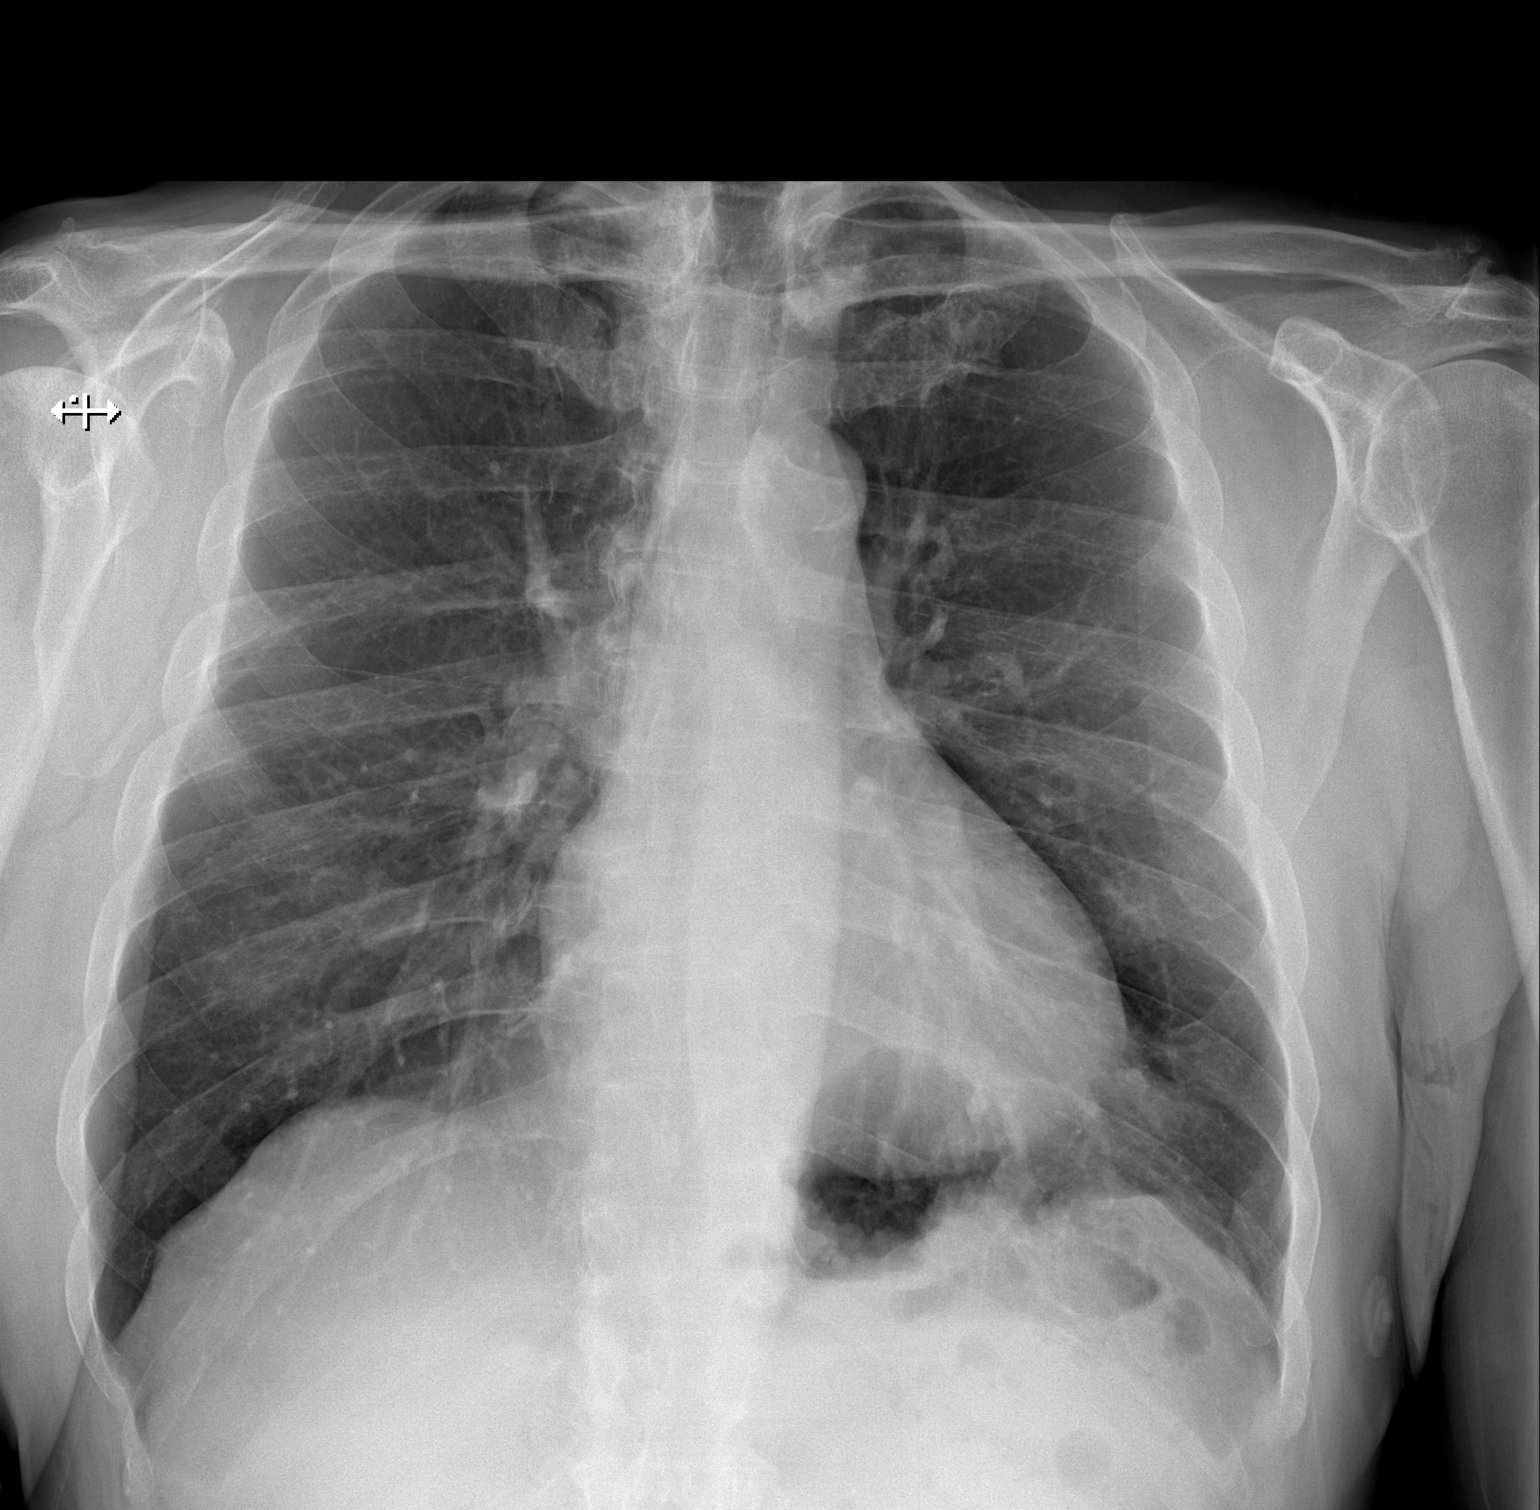

[w chest lat]
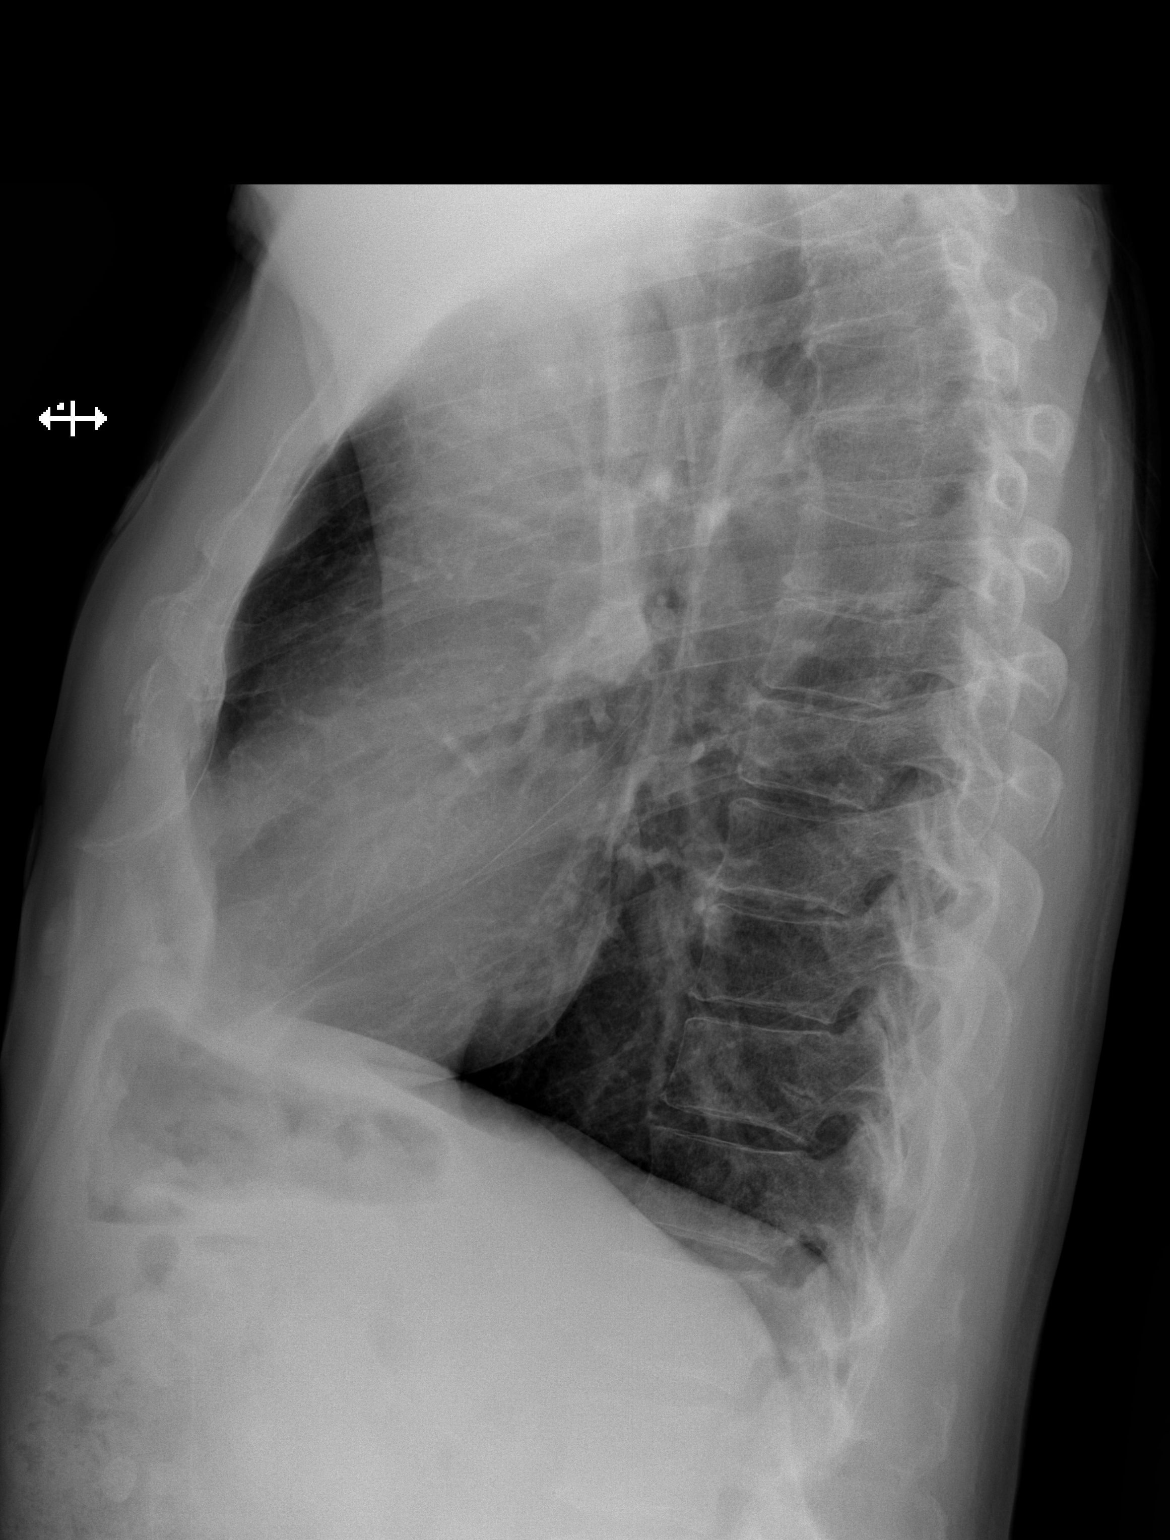

[2 of 2 positions shown; findings below may reference images not displayed]

FINDINGS: The heart size and mediastinal contours are within normal limits.
Both lungs are clear. No pneumothorax or pleural effusion is noted.
The visualized skeletal structures are unremarkable.
IMPRESSION: No active cardiopulmonary disease.

## 2015-08-04 MED ORDER — HYDROCHLOROTHIAZIDE 25 MG PO TABS: 25.0000 mg | ORAL_TABLET | Freq: Every day | ORAL | Status: DC

## 2015-08-04 NOTE — ED Notes (Signed)
PA at bedside.

## 2015-08-04 NOTE — Discharge Instructions (Signed)
As discussed, it is important that you follow up as soon as possible with your physician for continued management of your condition. ° °If you develop any new, or concerning changes in your condition, please return to the emergency department immediately. ° °

## 2015-08-04 NOTE — ED Provider Notes (Signed)
CSN: [id number]     Arrival date & time 08/04/15  1241 History   First MD Initiated Contact with Patient 08/04/15 1405     Chief Complaint  Patient presents with  . Hypertension     (Consider location/radiation/quality/duration/timing/severity/associated sxs/prior Treatment) HPI Patient presents with concern of persistent hypertension. Patient has previously been diagnosed with hypertension, though he has not ever taken any medication, for fear of medication effects. Over the past days, patient states that he has had mild headache, no confusion, disorientation or vision changes. No vomiting, diarrhea, syncope. Patient acknowledges a history of tonsil cancer in the distant past. 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  No recent diet changes, activity changes.   Past Medical History  Diagnosis Date  . Hypertension   . Thyroid disease   . Hypothyroidism 05/09/2014  . Cancer [facility])     Tonsil cancer   Past Surgical History  Procedure Laterality Date  . Tumor removal      in neck  . Back surgery     Family History  Problem Relation Age of Onset  . Cancer Father     lung cancer   Social History  Substance Use Topics  . Smoking status: Former Smoker -- 1.00 packs/day for 25 years    Quit date: 05/04/2001  . Smokeless tobacco: Never Used  . Alcohol Use: 1.2 oz/week    2 Shots of liquor per week     Comment: rarely    Review of Systems  Constitutional:       Per HPI, otherwise negative  HENT:       Per HPI, otherwise negative  Respiratory:       Per HPI, otherwise negative  Cardiovascular:       Per HPI, otherwise negative  Gastrointestinal: Negative for vomiting.  Endocrine:       Negative aside from HPI  Genitourinary:       Neg aside from HPI   Musculoskeletal:       Per HPI, otherwise negative  Skin: Negative.   Neurological: Negative for syncope.      Allergies  Review  of patient's allergies indicates no known allergies.  Home Medications   Prior to Admission medications   Medication Sig Start Date End Date Taking? Authorizing Provider  HYDROcodone-acetaminophen (NORCO/VICODIN) 5-325 MG per tablet Take 1 tablet by mouth every 6 (six) hours as needed for moderate pain. Reported on 08/04/2015   Yes Historical Provider, MD  levothyroxine (SYNTHROID, LEVOTHROID) 75 MCG tablet Take 75 mcg by mouth daily.   Yes Historical Provider, MD  hydrochlorothiazide (HYDRODIURIL) 25 MG tablet Take 1 tablet (25 mg total) by mouth daily. 08/04/15   Carmin Muskrat, MD   BP 152/80 mmHg  Pulse 69  Temp(Src) 97.4 F (36.3 C) (Oral)  Resp 15  SpO2 96% Physical Exam  Constitutional: He is oriented to person, place, and time. He appears well-developed. No distress.  HENT:  Head: Normocephalic and atraumatic.  Evidence of prior throat lymph ectomy, no asymmetry  Eyes: Conjunctivae and EOM are normal.  Neck: No tracheal deviation present.  Cardiovascular: Normal rate and regular rhythm.   Pulmonary/Chest: Effort normal. No stridor. No respiratory distress.  Abdominal: He exhibits no distension.  Musculoskeletal: He exhibits no edema.  Neurological: He is alert and oriented to person, place, and time.  Skin: Skin is warm and dry.  Psychiatric: He has a normal mood and affect.  Nursing  note and vitals reviewed.   ED Course  Procedures (including critical care time) Labs Review Labs Reviewed  BASIC METABOLIC PANEL - Abnormal; Notable for the following:    Glucose, Bld 105 (*)    All other components within normal limits  CBC  I-STAT TROPOININ, ED    Imaging Review Dg Chest 2 View  08/04/2015  CLINICAL DATA:  Hypertension. EXAM: CHEST  2 VIEW COMPARISON:  July 19, 2012. FINDINGS: The heart size and mediastinal contours are within normal limits. Both lungs are clear. No pneumothorax or pleural effusion is noted. The visualized skeletal structures are unremarkable.  IMPRESSION: No active cardiopulmonary disease. Electronically Signed   By: Marijo Conception, M.D.   On: 08/04/2015 13:34   I have personally reviewed and evaluated these images and lab results as part of my medical decision-making.   3:38 PM On repeat exam the patient is in no distress. Blood pressure has improved substantially. We discussed all findings, the need to initiate oral antihypertensive, follow-up with primary care.    MDM   Final diagnoses:  Hypertensive urgency   He presents with concern for hypertensive urgency. Patient does have mild headache, generalized concerns, but no evidence for neurologic dysfunction, nor end organ effects of systemic hypertension. Patient appropriate for discharge after initiation of outpatient therapy.  He will follow up with his [place].  Carmin Muskrat, MD 08/04/15 1539

## 2015-08-04 NOTE — Patient Instructions (Signed)
Come back to see me when you are stable and we can address your mood issues.

## 2015-08-04 NOTE — Progress Notes (Signed)
[facility] [address], [place] [address] [phone]  Date:  08/04/2015   Name:  Alexander Duran   DOB:  05-29-1946   MRN:  [id number]  PCP:  No PCP Per Patient    Chief Complaint: Headache; Neck Pain; and Depression   History of Present Illness:  This is a 70 y.o. male with PMH HTN, tonsil cancer and hypothyroidism who is presenting with headache and neck tightness x 1 day. States symptoms started when waking this morning. He is shaking all over. States it is hard for him to swallow. He woke up with a headache. He was preparing breakfast and started feeling dizzy. Broke out in a sweat. He took 81 mg aspirin and seemed to helped some but the broke out in a sweat again. That's when he decided to come in.  States his blood pressure has been high for several years. He has been trying to work on diet and exercise to control his BP for the past 2 years. He recently went to the [place] and his provider wanted to start him on medication. He states he thinks his bp was around 150s/80s at that time. He has never seen his bp this high (192/98). He does not check his BP at home. He states he has been trying to work on diet and weight loss. He states in the past 2 years his weight was around 225 lb at one point. 1 month ago at the [place] he states his weight was 188. It is now 179. He has been trying to lose weight, but has not done anything drastically different in the past month to lose 9 pounds. Had dx of throat cancer in 2003. Had a right neck resection. No recurrence since.  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 He is with her 5 days a week and 6 nights. On his time away, he goes home to be with his wife who is an alcoholic and has other medical issues. 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 He will drink 6 beers or 4 shots of tequila or a bottle of wine. He does not sleep well - gets 4 hours of sleep a night. He is considerably stressed. He states  his tells his provider at the [place] this every time he goes but has never been started on anything for mood.  He denies change in bowel, bloody stool, hematuria, difficulty urinating, abdominal pain, cough, wheezing, fever, night sweats.  Review of Systems:  Review of Systems See HPI  Patient Active Problem List   Diagnosis Date Noted  . Hypothyroidism 05/09/2014  . Essential hypertension 05/09/2014  . Tonsil cancer ([place]) 05/04/2013    Prior to Admission medications   Medication Sig Start Date End Date Taking? Authorizing Provider  levothyroxine (SYNTHROID, LEVOTHROID) 75 MCG tablet Take 75 mcg by mouth daily.   Yes Historical Provider, MD  HYDROcodone-acetaminophen (NORCO/VICODIN) 5-325 MG per tablet Take 1 tablet by mouth every 6 (six) hours as needed for moderate pain. Reported on 08/04/2015    Historical Provider, MD    No Known Allergies  Past Surgical History  Procedure Laterality Date  . Tumor removal      in neck  . Back surgery      Social History  Substance Use Topics  . Smoking status: Former Smoker -- 1.00 packs/day for 25 years    Quit date: 05/04/2001  . Smokeless tobacco: Never Used  . Alcohol  Use: 1.2 oz/week    2 Shots of liquor per week     Comment: rarely    Family History  Problem Relation Age of Onset  . Cancer Father     lung cancer    Medication list has been reviewed and updated.  Physical Examination:  Physical Exam  Constitutional: He is oriented to person, place, and time. He appears well-developed and well-nourished. No distress.  HENT:  Head: Normocephalic and atraumatic.  Right Ear: Hearing, tympanic membrane, external ear and ear canal normal.  Left Ear: Hearing, tympanic membrane, external ear and ear canal normal.  Nose: Nose normal.  Mouth/Throat: Uvula is midline, oropharynx is clear and moist and mucous membranes are normal.  Eyes: Conjunctivae and lids are normal. Right eye exhibits no discharge. Left eye exhibits no  discharge. No scleral icterus.  Neck: Carotid bruit is not present. No thyromegaly present.  Right neck resection  Cardiovascular: Normal rate, regular rhythm, normal heart sounds and normal pulses.   No murmur heard. Pulmonary/Chest: Effort normal and breath sounds normal. No respiratory distress. He has no wheezes. He has no rhonchi. He has no rales.  Abdominal: Soft. Normal appearance. He exhibits no mass. There is no tenderness.  Musculoskeletal: Normal range of motion.  Lymphadenopathy:       Head (right side): No submental, no submandibular and no tonsillar adenopathy present.       Head (left side): No submental, no submandibular and no tonsillar adenopathy present.    He has no cervical adenopathy.       Right: No supraclavicular adenopathy present.       Left: No supraclavicular adenopathy present.  Neurological: He is alert and oriented to person, place, and time.  Skin: Skin is warm, dry and intact. No lesion and no rash noted.  Psychiatric: He has a normal mood and affect. His speech is normal and behavior is normal. Thought content normal.   BP 194/98 mmHg  Pulse 65  Temp(Src) 97.9 F (36.6 C) (Oral)  Resp 18  Ht 5\' 8"  (1.727 m)  Wt 179 lb (81.194 kg)  BMI 27.22 kg/m2  SpO2 98%  Results for orders placed or performed in visit on 08/04/15  POCT glucose (manual entry)  Result Value Ref Range   POC Glucose 114 (A) 70 - 99 mg/dl   EKG: right bundle branch block, unchanged from 2014  Assessment and Plan:  1. Hypertensive urgency 2. Elevated blood pressure 3. Diaphoresis  EKG unchanged from 2014. Glucose 114. Pt still having neck tightness and feeling a little shaky. I think pt needs further work up from a cardiac standpoint. Called  and they stated their wait was too long - advised he go to [facility]. Alerted charge nurse of his arrival. - EKG 12-Lead - POCT glucose (manual entry)  4. Neck tightness 5. History of throat cancer 6. Loss of  weight History of throat cancer, current neck tightness, and recent unintentional 10 pound weight loss in the past 1 month. Concerned for malignancy. Hopefully will be addressed at ED. From the notes I'm reading from the ED, it does not look like they will be addressing this issue. I will call [facility] and see if I can him in with Dr. Alvy Bimler who has been following his throat cancer.  7. Stress at home Pt under considerable stress. [facility]. I advised pt to come back to see me and we would address once this acute issue is settled.  Benjaman Pott Drenda Freeze, MHS [facility]  08/04/2015

## 2015-08-04 NOTE — ED Notes (Signed)
Per pt, states has been trying to control his HTN with diet and exercise-went to [facility] and was to come to ED for eval

## 2015-08-04 NOTE — ED Notes (Addendum)
Patient's primary care physician wants the patient to start blood pressure medication. He states that his doctor says if he comes back with a high blood pressure he will have to start. Patient states he is opposed to taking medications due to the side effects and "I am just a stubborn old man".

## 2015-08-06 NOTE — Progress Notes (Signed)
Patient ID: Alexander Duran, male   DOB: [birth year], 70 y.o.   MRN: [id number] Reviewed documentation and EKG and agree w/ assessment and plan. Delman Cheadle, MD MPH

## 2015-08-08 ENCOUNTER — Other Ambulatory Visit: Payer: Self-pay | Admitting: Hematology and Oncology

## 2015-08-08 ENCOUNTER — Encounter: Payer: Self-pay | Admitting: Hematology and Oncology

## 2015-08-08 ENCOUNTER — Telehealth: Payer: Self-pay | Admitting: Physician Assistant

## 2015-08-08 DIAGNOSIS — E038 Other specified hypothyroidism: Secondary | ICD-10-CM

## 2015-08-08 DIAGNOSIS — R634 Abnormal weight loss: Secondary | ICD-10-CM

## 2015-08-08 DIAGNOSIS — C099 Malignant neoplasm of tonsil, unspecified: Secondary | ICD-10-CM

## 2015-08-08 HISTORY — DX: Abnormal weight loss: R63.4

## 2015-08-08 NOTE — Telephone Encounter (Signed)
Spoke with pt. He is feeling better. He was placed on hctz 25 mg in the ED and states it is helping.  I called Dr. Calton Dach office and alerted her of his unintended weight loss and neck tightness. She will call him and work her into her schedule. I let pt know that he should hear from Dr. Alvy Bimler soon.

## 2015-08-09 ENCOUNTER — Encounter (HOSPITAL_COMMUNITY): Payer: Self-pay | Admitting: Emergency Medicine

## 2015-08-09 ENCOUNTER — Telehealth: Payer: Self-pay | Admitting: *Deleted

## 2015-08-09 ENCOUNTER — Emergency Department (HOSPITAL_COMMUNITY)
Admission: EM | Admit: 2015-08-09 | Discharge: 2015-08-09 | Disposition: A | Discharge status: Home / Self Care | Payer: Non-veteran care | Attending: Emergency Medicine | Admitting: Emergency Medicine

## 2015-08-09 DIAGNOSIS — R61 Generalized hyperhidrosis: Secondary | ICD-10-CM | POA: Insufficient documentation

## 2015-08-09 DIAGNOSIS — Z79899 Other long term (current) drug therapy: Secondary | ICD-10-CM | POA: Diagnosis not present

## 2015-08-09 DIAGNOSIS — Z8669 Personal history of other diseases of the nervous system and sense organs: Secondary | ICD-10-CM | POA: Insufficient documentation

## 2015-08-09 DIAGNOSIS — R634 Abnormal weight loss: Secondary | ICD-10-CM | POA: Diagnosis not present

## 2015-08-09 DIAGNOSIS — E871 Hypo-osmolality and hyponatremia: Secondary | ICD-10-CM | POA: Diagnosis not present

## 2015-08-09 DIAGNOSIS — R070 Pain in throat: Secondary | ICD-10-CM | POA: Insufficient documentation

## 2015-08-09 DIAGNOSIS — R51 Headache: Secondary | ICD-10-CM | POA: Diagnosis present

## 2015-08-09 DIAGNOSIS — R2689 Other abnormalities of gait and mobility: Secondary | ICD-10-CM | POA: Insufficient documentation

## 2015-08-09 DIAGNOSIS — I10 Essential (primary) hypertension: Secondary | ICD-10-CM | POA: Insufficient documentation

## 2015-08-09 DIAGNOSIS — Z859 Personal history of malignant neoplasm, unspecified: Secondary | ICD-10-CM

## 2015-08-09 DIAGNOSIS — R112 Nausea with vomiting, unspecified: Secondary | ICD-10-CM | POA: Diagnosis not present

## 2015-08-09 DIAGNOSIS — Z8589 Personal history of malignant neoplasm of other organs and systems: Secondary | ICD-10-CM | POA: Diagnosis not present

## 2015-08-09 DIAGNOSIS — E039 Hypothyroidism, unspecified: Secondary | ICD-10-CM | POA: Diagnosis not present

## 2015-08-09 DIAGNOSIS — H538 Other visual disturbances: Secondary | ICD-10-CM | POA: Diagnosis not present

## 2015-08-09 DIAGNOSIS — Z87891 Personal history of nicotine dependence: Secondary | ICD-10-CM | POA: Insufficient documentation

## 2015-08-09 DIAGNOSIS — R531 Weakness: Secondary | ICD-10-CM | POA: Diagnosis not present

## 2015-08-09 DIAGNOSIS — R519 Headache, unspecified: Secondary | ICD-10-CM

## 2015-08-09 LAB — CBC WITH DIFFERENTIAL/PLATELET
BASOS PCT: 0 %
Basophils Absolute: 0 10*3/uL (ref 0.0–0.1)
Eosinophils Absolute: 0 10*3/uL (ref 0.0–0.7)
Eosinophils Relative: 0 %
HCT: 36.3 % — ABNORMAL LOW (ref 39.0–52.0)
Hemoglobin: 13.4 g/dL (ref 13.0–17.0)
Lymphocytes Relative: 16 %
Lymphs Abs: 0.6 10*3/uL — ABNORMAL LOW (ref 0.7–4.0)
MCH: 33.6 pg (ref 26.0–34.0)
MCHC: 36.9 g/dL — AB (ref 30.0–36.0)
MCV: 91 fL (ref 78.0–100.0)
MONO ABS: 0.8 10*3/uL (ref 0.1–1.0)
MONOS PCT: 20 %
NEUTROS PCT: 64 %
Neutro Abs: 2.6 10*3/uL (ref 1.7–7.7)
Platelets: 194 10*3/uL (ref 150–400)
RBC: 3.99 MIL/uL — ABNORMAL LOW (ref 4.22–5.81)
RDW: 12.7 % (ref 11.5–15.5)
WBC: 4.1 10*3/uL (ref 4.0–10.5)

## 2015-08-09 LAB — I-STAT CHEM 8, ED
BUN: 16 mg/dL (ref 6–20)
CALCIUM ION: 0.83 mmol/L — AB (ref 1.13–1.30)
Chloride: 87 mmol/L — ABNORMAL LOW (ref 101–111)
Creatinine, Ser: 0.6 mg/dL — ABNORMAL LOW (ref 0.61–1.24)
Glucose, Bld: 92 mg/dL (ref 65–99)
HEMATOCRIT: 47 % (ref 39.0–52.0)
HEMOGLOBIN: 16 g/dL (ref 13.0–17.0)
Potassium: 6 mmol/L — ABNORMAL HIGH (ref 3.5–5.1)
SODIUM: 124 mmol/L — AB (ref 135–145)
TCO2: 27 mmol/L (ref 0–100)

## 2015-08-09 LAB — BASIC METABOLIC PANEL
Anion gap: 15 (ref 5–15)
BUN: 10 mg/dL (ref 6–20)
CALCIUM: 9.1 mg/dL (ref 8.9–10.3)
CO2: 27 mmol/L (ref 22–32)
CREATININE: 0.74 mg/dL (ref 0.61–1.24)
Chloride: 84 mmol/L — ABNORMAL LOW (ref 101–111)
GFR calc Af Amer: >60 mL/min (ref >60)
GLUCOSE: 105 mg/dL — AB (ref 65–99)
Potassium: 3.2 mmol/L — ABNORMAL LOW (ref 3.5–5.1)
SODIUM: 126 mmol/L — AB (ref 135–145)

## 2015-08-09 LAB — I-STAT TROPONIN, ED: Troponin i, poc: 0.01 ng/mL (ref 0.00–0.08)

## 2015-08-09 MED ORDER — ONDANSETRON HCL 4 MG PO TABS: 4.0000 mg | ORAL_TABLET | Freq: Once | ORAL | Status: DC

## 2015-08-09 MED ORDER — ONDANSETRON 4 MG PO TBDP
4.0000 mg | ORAL_TABLET | Freq: Once | ORAL | Status: AC
  Administered 2015-08-09: 4 mg via ORAL

## 2015-08-09 MED ORDER — IBUPROFEN 400 MG PO TABS
400.0000 mg | ORAL_TABLET | Freq: Once | ORAL | Status: AC
  Administered 2015-08-09: 400 mg via ORAL

## 2015-08-09 MED ORDER — ONDANSETRON 4 MG PO TBDP
ORAL_TABLET | ORAL | Status: AC
  Filled 2015-08-09: qty 1

## 2015-08-09 MED ORDER — ALPRAZOLAM 0.5 MG PO TABS: 0.5000 mg | ORAL_TABLET | Freq: Every evening | ORAL | Status: DC | PRN

## 2015-08-09 MED ORDER — IBUPROFEN 400 MG PO TABS
ORAL_TABLET | ORAL | Status: AC
  Filled 2015-08-09: qty 1

## 2015-08-09 MED ORDER — NAPROXEN 250 MG PO TABS
500.0000 mg | ORAL_TABLET | Freq: Two times a day (BID) | ORAL | Status: DC
  Administered 2015-08-09: 500 mg via ORAL
  Filled 2015-08-09: qty 2

## 2015-08-09 NOTE — Telephone Encounter (Signed)
Spoke with patient on Tuesday, was concerned about payment. He is going to call [facility] for prior approval and will call us back after he talks with [place]

## 2015-08-09 NOTE — Telephone Encounter (Signed)
-----   Message from Heath Lark, MD sent at 08/09/2015 12:01 PM EST ----- Regarding: FW: return appt Can you please document this ----- Message -----    From: Patton Salles, RN    Sent: 08/09/2015  11:24 AM      To: Cathlean Cower, RN, Heath Lark, MD Subject: RE: return appt                                Pt will call us back after he talks to the [place] for approval.  ----- Message -----    From: Heath Lark, MD    Sent: 08/08/2015  12:45 PM      To: Patton Salles, RN Subject: return appt                                    PCP called, requesting me to see him back for weight loss I have ordered labs but not sure when he is free These are my available times: please call him and place POF  2/22 8, 830, 9 am, 1130 to 130, 230, 3 pm 2/23: 330, 4pm 3/2 any 30 mins 3/3 before 12

## 2015-08-09 NOTE — ED Notes (Signed)
While in triage pt noted to be pale in color, vomiting and diaphoretic. Vital signs checked again and WNL- hr 70 bp 130/80 o2-100

## 2015-08-09 NOTE — Discharge Instructions (Signed)
Call Dr. Alvy Bimler tomorrow for the appointment These were her availabilities: 2/23: 330, 4pm 3/2 any 30 mins 3/3 before 12  Also, [facility] AND GET THE [facility]OVAL process started.  Please see family medicine again 1 week for repeat sodium. Low sodium could be due to the medication we started as well  Please return to the ER if your symptoms worsen; you have increased pain, fevers, chills, inability to keep any medications down, confusion, seizure, unstable balance. Otherwise see the outpatient doctor as requested.    Hyponatremia Hyponatremia is when the amount of salt (sodium) in your blood is too low. When sodium levels are low, your cells absorb extra water and they swell. The swelling happens throughout the body, but it mostly affects the brain. CAUSES This condition may be caused by:  Heart, kidney, or liver problems.  Thyroid problems.  Adrenal gland problems.  Metabolic conditions, such as syndrome of inappropriate antidiuretic hormone (SIADH).  Severe vomiting and diarrhea.  Certain medicines or illegal drugs.  Dehydration.  Drinking too much water.  Eating a diet that is low in sodium.  Large burns on your body.  Sweating. RISK FACTORS This condition is more likely to develop in people who:  Have long-term (chronic) kidney disease.  Have heart failure.  Have a medical condition that causes frequent or excessive diarrhea.  Have metabolic conditions, such as Addison disease or SIADH.  Take certain medicines that affect the sodium and fluid balance in the blood. Some of these medicine types include:  Diuretics.  NSAIDs.  Some opioid pain medicines.  Some antidepressants.  Some seizure prevention medicines. SYMPTOMS  Symptoms of this condition include:  Nausea and vomiting.  Confusion.  Lethargy.  Agitation.  Headache.  Seizures.  Unconsciousness.  Appetite loss.  Muscle weakness and cramping.  Feeling weak or  light-headed.  Having a rapid heart rate.  Fainting, in severe cases. DIAGNOSIS This condition is diagnosed with a medical history and physical exam. You will also have other tests, including:  Blood tests.  Urine tests. TREATMENT Treatment for this condition depends on the cause. Treatment may include:  Fluids given through an IV tube that is inserted into one of your veins.  Medicines to correct the sodium imbalance. If medicines are causing the condition, the medicines will need to be adjusted.  Limiting water or fluid intake to get the correct sodium balance. HOME CARE INSTRUCTIONS  Take medicines only as directed by your health care provider. Many medicines can make this condition worse. Talk with your health care provider about any medicines that you are currently taking.  Carefully follow a recommended diet as directed by your health care provider.  Carefully follow instructions from your health care provider about fluid restrictions.  Keep all follow-up visits as directed by your health care provider. This is important.  Do not drink alcohol. SEEK MEDICAL CARE IF:  You develop worsening nausea, fatigue, headache, confusion, or weakness.  Your symptoms go away and then return.  You have problems following the recommended diet. SEEK IMMEDIATE MEDICAL CARE IF:  You have a seizure.  You faint.  You have ongoing diarrhea or vomiting.   This information is not intended to replace advice given to you by your health care provider. Make sure you discuss any questions you have with your health care provider.   Document Released: 05/24/2002 Document Revised: 10/18/2014 Document Reviewed: 06/23/2014 Elsevier Interactive Patient Education 2016 Elsevier Inc.  Weakness Weakness is a lack of strength. It may be felt  all over the body (generalized) or in one specific part of the body (focal). Some causes of weakness can be serious. You may need further medical evaluation,  especially if you are elderly or you have a history of immunosuppression (such as chemotherapy or HIV), kidney disease, heart disease, or diabetes. CAUSES  Weakness can be caused by many different things, including:  Infection.  Physical exhaustion.  Internal bleeding or other blood loss that results in a lack of red blood cells (anemia).  Dehydration. This cause is more common in elderly people.  Side effects or electrolyte abnormalities from medicines, such as pain medicines or sedatives.  Emotional distress, anxiety, or depression.  Circulation problems, especially severe peripheral arterial disease.  Heart disease, such as rapid atrial fibrillation, bradycardia, or heart failure.  Nervous system disorders, such as Guillain-Barr syndrome, multiple sclerosis, or stroke. DIAGNOSIS  To find the cause of your weakness, your caregiver will take your history and perform a physical exam. Lab tests or X-rays may also be ordered, if needed. TREATMENT  Treatment of weakness depends on the cause of your symptoms and can vary greatly. HOME CARE INSTRUCTIONS   Rest as needed.  Eat a well-balanced diet.  Try to get some exercise every day.  Only take over-the-counter or prescription medicines as directed by your caregiver. SEEK MEDICAL CARE IF:   Your weakness seems to be getting worse or spreads to other parts of your body.  You develop new aches or pains. SEEK IMMEDIATE MEDICAL CARE IF:   You cannot perform your normal daily activities, such as getting dressed and feeding yourself.  You cannot walk up and down stairs, or you feel exhausted when you do so.  You have shortness of breath or chest pain.  You have difficulty moving parts of your body.  You have weakness in only one area of the body or on only one side of the body.  You have a fever.  You have trouble speaking or swallowing.  You cannot control your bladder or bowel movements.  You have black or bloody  vomit or stools. MAKE SURE YOU:  Understand these instructions.  Will watch your condition.  Will get help right away if you are not doing well or get worse.   This information is not intended to replace advice given to you by your health care provider. Make sure you discuss any questions you have with your health care provider.   Document Released: 06/03/2005 Document Revised: 12/03/2011 Document Reviewed: 08/02/2011 Elsevier Interactive Patient Education [employer].

## 2015-08-09 NOTE — ED Notes (Addendum)
Per EMS: pt from home for eval of HTN and blurred vision that started at 0800 today. Pt seen for same and given rx for hctz. Ems denies any neuro deficits.

## 2015-08-09 NOTE — ED Notes (Signed)
Pt reports nausea, denies any numbness or tingling, denies any dizziness.

## 2015-08-09 NOTE — ED Notes (Signed)
Pt. oob to the bathroom, gait steady.  Pt. Is feeling better, nausea is gone. Pt. Given water and crackers to see if he can tolerate it.

## 2015-08-09 NOTE — ED Provider Notes (Signed)
CSN: [id number]     Arrival date & time 08/09/15  1324 History   First MD Initiated Contact with Patient 08/09/15 1718     Chief Complaint  Patient presents with  . Headache  . Blurred Vision     (Consider location/radiation/quality/duration/timing/severity/associated sxs/prior Treatment) HPI Comments: 70 y.o. male with PMH HTN, remote hx of tonsil cancer and hypothyroidism comes in with cc of headaches, blurred vision and HTN. PT reports that he has had blurry vision since last week. Blurry vision is bilateral. No specific aggravating or relieving factors. Pt hasnt seen eye doctor in 2 years. No new weakness, diplopia, gait instability. Pt also has been having headaches, off and on for several months now. He thinks the headaches are due to stress/anxiety or BP. He was started on HCTZ recently.  Today pt was having headache, blurry vision and really weak - so he called EMS. Pt also has had some throat pain, intermittent x 2 weeks. No specific precipitant, no specific aggravating or relieving factor. Pain is not worse with swallowing, discomfort is on the L side. No significant voice change. + weight loss, + sweats. Cancer of the throat in 2004, now cancer free.  ROS 10 Systems reviewed and are negative for acute change except as noted in the HPI.      Patient is a 70 y.o. male presenting with headaches. The history is provided by the patient.  Headache   Past Medical History  Diagnosis Date  . Hypertension   . Thyroid disease   . Hypothyroidism 05/09/2014  . Cancer ([place])     Tonsil cancer  . Weight loss 08/08/2015   Past Surgical History  Procedure Laterality Date  . Tumor removal      in neck  . Back surgery     Family History  Problem Relation Age of Onset  . Cancer Father     lung cancer   Social History  Substance Use Topics  . Smoking status: Former Smoker -- 1.00 packs/day for 25 years    Quit date: 05/04/2001  . Smokeless tobacco: Never Used  . Alcohol Use:  1.2 oz/week    2 Shots of liquor per week     Comment: rarely    Review of Systems  Neurological: Positive for headaches.      Allergies  Review of patient's allergies indicates no known allergies.  Home Medications   Prior to Admission medications   Medication Sig Start Date End Date Taking? Authorizing Provider  hydrochlorothiazide (HYDRODIURIL) 25 MG tablet Take 1 tablet (25 mg total) by mouth daily. 08/04/15  Yes Carmin Muskrat, MD  HYDROcodone-acetaminophen (NORCO/VICODIN) 5-325 MG per tablet Take 1 tablet by mouth every 6 (six) hours as needed for moderate pain. Reported on 08/04/2015   Yes Historical Provider, MD  levothyroxine (SYNTHROID, LEVOTHROID) 75 MCG tablet Take 75 mcg by mouth daily.   Yes Historical Provider, MD   BP 141/80 mmHg  Pulse 70  Temp(Src) 97.8 F (36.6 C) (Oral)  Resp 8  SpO2 95% Physical Exam  Constitutional: He is oriented to person, place, and time. He appears well-developed.  HENT:  Head: Atraumatic.  Neck: Neck supple.  Cardiovascular: Normal rate.   Pulmonary/Chest: Effort normal.  Neurological: He is alert and oriented to person, place, and time.  Skin: Skin is warm.  Nursing note and vitals reviewed.   ED Course  Procedures (including critical care time) Labs Review Labs Reviewed  CBC WITH DIFFERENTIAL/PLATELET - Abnormal; Notable for the following:  RBC 3.99 (*)    HCT 36.3 (*)    MCHC 36.9 (*)    Lymphs Abs 0.6 (*)    All other components within normal limits  I-STAT CHEM 8, ED - Abnormal; Notable for the following:    Sodium 124 (*)    Potassium 6.0 (*)    Chloride 87 (*)    Creatinine, Ser 0.60 (*)    Calcium, Ion 0.83 (*)    All other components within normal limits  BASIC METABOLIC PANEL  I-STAT TROPOININ, ED    Imaging Review No results found. I have personally reviewed and evaluated these images and lab results as part of my medical decision-making.   EKG Interpretation   Date/Time:  Wednesday August 09 2015 19:59:03 EST Ventricular Rate:  65 PR Interval:  179 QRS Duration: 160 QT Interval:  468 QTC Calculation: 487 R Axis:   -7 Text Interpretation:  Sinus rhythm Ventricular premature complex Right  bundle branch block t wave inversion in the anterior leads new changes  compared to old ekg Confirmed by Kathrynn Humble, MD, Thelma Comp [phone]) on 08/09/2015  8:20:12 PM      MDM   Final diagnoses:  Hyponatremia    Pt comes in with blurry vision bilaterally, headache and unsteady gait. Pt has remote hx of tonsillar cancer, cancer free. Is having new weight loss, sweats. Pt also having headache, elevated blood pressure, unsteady gait and vision complains.  Bilateral vision complains - will need to see optician. It wont be stroke as it is bilateral. He has occipital headache- now sure what to make of it. He has non focal neuro exam. Headache has improved. No trauma.  Pt also has elevated BP complains - but his BP is fine here. Pt also had an episode of nausea, emesis - GI exam is fine. WE will get EKG and trop.  Concerns high for cancer returning. Seems like the urgent care team had called Oncologist, and the Oncologist is willing to see patient in the clinic after getting [facility] clearance. Pt aware of the plan. He hasnt called the [facility] yet. I have asked him to call [facility], and then set up an appointment with the Cancer doctor. Anticipate d/c.  @8 :15 pm: Pt's sodium is low. Will get BMP. He has ambulated, and was steady - but with weakness, unsteady gait, possible paraneoplastic process going on -wouldn't be a good idea to send him home with low Na. Will get BMP, if still low, will admit.  Varney Biles, MD 08/09/15 2036

## 2015-08-10 ENCOUNTER — Other Ambulatory Visit (HOSPITAL_BASED_OUTPATIENT_CLINIC_OR_DEPARTMENT_OTHER): Payer: No Typology Code available for payment source

## 2015-08-10 ENCOUNTER — Encounter: Payer: Self-pay | Admitting: Hematology and Oncology

## 2015-08-10 ENCOUNTER — Telehealth: Payer: Self-pay | Admitting: Hematology and Oncology

## 2015-08-10 ENCOUNTER — Other Ambulatory Visit: Payer: Self-pay | Admitting: *Deleted

## 2015-08-10 ENCOUNTER — Ambulatory Visit (HOSPITAL_BASED_OUTPATIENT_CLINIC_OR_DEPARTMENT_OTHER): Payer: No Typology Code available for payment source | Admitting: Hematology and Oncology

## 2015-08-10 VITALS — BP 161/88 | HR 72 | Temp 97.8°F | Resp 18 | Ht 68.0 in | Wt 173.6 lb

## 2015-08-10 DIAGNOSIS — E039 Hypothyroidism, unspecified: Secondary | ICD-10-CM

## 2015-08-10 DIAGNOSIS — E038 Other specified hypothyroidism: Secondary | ICD-10-CM

## 2015-08-10 DIAGNOSIS — C099 Malignant neoplasm of tonsil, unspecified: Secondary | ICD-10-CM

## 2015-08-10 DIAGNOSIS — G4489 Other headache syndrome: Secondary | ICD-10-CM | POA: Diagnosis not present

## 2015-08-10 DIAGNOSIS — J029 Acute pharyngitis, unspecified: Secondary | ICD-10-CM

## 2015-08-10 DIAGNOSIS — Z85819 Personal history of malignant neoplasm of unspecified site of lip, oral cavity, and pharynx: Secondary | ICD-10-CM

## 2015-08-10 DIAGNOSIS — R110 Nausea: Secondary | ICD-10-CM

## 2015-08-10 DIAGNOSIS — R634 Abnormal weight loss: Secondary | ICD-10-CM

## 2015-08-10 DIAGNOSIS — R630 Anorexia: Secondary | ICD-10-CM

## 2015-08-10 DIAGNOSIS — I10 Essential (primary) hypertension: Secondary | ICD-10-CM

## 2015-08-10 HISTORY — DX: Other headache syndrome: G44.89

## 2015-08-10 HISTORY — DX: Nausea: R11.0

## 2015-08-10 LAB — COMPREHENSIVE METABOLIC PANEL
ALK PHOS: 59 U/L (ref 40–150)
ALT: 28 U/L (ref 0–55)
AST: 55 U/L — ABNORMAL HIGH (ref 5–34)
Albumin: 4.1 g/dL (ref 3.5–5.0)
Anion Gap: 11 mEq/L (ref 3–11)
BUN: 13 mg/dL (ref 7.0–26.0)
CO2: 28 meq/L (ref 22–29)
Calcium: 9 mg/dL (ref 8.4–10.4)
Chloride: 85 mEq/L — ABNORMAL LOW (ref 98–109)
Creatinine: 0.8 mg/dL (ref 0.7–1.3)
EGFR: >90 mL/min/{1.73_m2} (ref >90)
GLUCOSE: 103 mg/dL (ref 70–140)
POTASSIUM: 3.3 meq/L — AB (ref 3.5–5.1)
Sodium: 125 mEq/L — ABNORMAL LOW (ref 136–145)
Total Bilirubin: 0.63 mg/dL (ref 0.20–1.20)
Total Protein: 7.5 g/dL (ref 6.4–8.3)

## 2015-08-10 LAB — CBC WITH DIFFERENTIAL/PLATELET
BASO%: 0.3 % (ref 0.0–2.0)
Basophils Absolute: 0 10*3/uL (ref 0.0–0.1)
EOS%: 0.6 % (ref 0.0–7.0)
Eosinophils Absolute: 0 10*3/uL (ref 0.0–0.5)
HCT: 44.5 % (ref 38.4–49.9)
HGB: 15.3 g/dL (ref 13.0–17.1)
LYMPH%: 18 % (ref 14.0–49.0)
MCH: 31.6 pg (ref 27.2–33.4)
MCHC: 34.4 g/dL (ref 32.0–36.0)
MCV: 91.8 fL (ref 79.3–98.0)
MONO#: 0.7 10*3/uL (ref 0.1–0.9)
MONO%: 17.9 % — AB (ref 0.0–14.0)
NEUT#: 2.6 10*3/uL (ref 1.5–6.5)
NEUT%: 63.2 % (ref 39.0–75.0)
PLATELETS: 209 10*3/uL (ref 140–400)
RBC: 4.85 10*6/uL (ref 4.20–5.82)
RDW: 12.8 % (ref 11.0–14.6)
WBC: 4.1 10*3/uL (ref 4.0–10.3)
lymph#: 0.7 10*3/uL — ABNORMAL LOW (ref 0.9–3.3)

## 2015-08-10 MED ORDER — LISINOPRIL 5 MG PO TABS
5.0000 mg | ORAL_TABLET | Freq: Every day | ORAL | Status: DC
Start: 2015-08-10 — End: 2019-10-28

## 2015-08-10 MED ORDER — BUTALBITAL-APAP-CAFFEINE 50-325-40 MG PO TABS: 1.0000 | ORAL_TABLET | Freq: Four times a day (QID) | ORAL | Status: DC | PRN

## 2015-08-10 MED ORDER — ONDANSETRON HCL 8 MG PO TABS: 8.0000 mg | ORAL_TABLET | Freq: Three times a day (TID) | ORAL | Status: DC | PRN

## 2015-08-10 MED ORDER — AMLODIPINE BESYLATE 10 MG PO TABS: 10.0000 mg | ORAL_TABLET | Freq: Every day | ORAL | Status: DC

## 2015-08-10 NOTE — Assessment & Plan Note (Signed)
He has extreme stress. Other possibility include hyperthyroidism or cancer recurrence. I will proceed with imaging study for further evaluation

## 2015-08-10 NOTE — Assessment & Plan Note (Signed)
He is visibly depressed and admitted being depressed because of extreme stress looking after his elderly mother. He is having a family meeting with other family members to arrange for other care for his ailing mother. Sometimes, inadequately treated hypothyroidism can also contribute to his symptoms.  Thyroid function panel is pending today

## 2015-08-10 NOTE — Assessment & Plan Note (Signed)
This is multi factorial. Recommend anti-emetics as needed until further evaluation as above

## 2015-08-10 NOTE — Progress Notes (Signed)
Addendum to above:  He also has nonproductive cough. I will order CT scan of the chest to evaluate this

## 2015-08-10 NOTE — Assessment & Plan Note (Signed)
He has significant headaches and stress. He was prescribed Xanax but has not started taking it. The patient also stopped all caffeinated drinks recently because of hypertension.  I think withdrawal from caffeinated drinks can also cause headache. I recommended the patient a trial of short-term Fioricet

## 2015-08-10 NOTE — Telephone Encounter (Signed)
per pof to sch pt appt-[facility] will call to [facility] trmt-gave avs

## 2015-08-10 NOTE — Assessment & Plan Note (Signed)
The patient looked depressed. He has significant symptoms with anorexia and weight loss and sensation of sore throat. His last imaging study was in December 2015 and they were negative for cancer recurrence  However, in view of his symptoms, I cannot exclude the possibility of recurrence of cancer. I recommend imaging studies CT scan of the neck, chest, abdomen and pelvis for further evaluation and he agreed to proceed. In the meantime, I will provide supportive care

## 2015-08-10 NOTE — Progress Notes (Signed)
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 Erik Obey performed neck exploration, dissection, panendoscopy and tonsillectomy    Radiation Therapy he completed adjuvant RT without chemotherapy    INTERVAL HISTORY: Please see below for problem oriented charting. the patient is seen today as an urgent visit because of concern for cancer. He was last seen in December 2015 and at that time imaging study was negative. Over the past 3 months, he had poor appetite, 25 pound weight loss, uncontrolled hypertension, intermittent sore throat, poor energy level with fatigue, nausea and headaches. He also complained of abdominal bloating. He has poor sleep due to extreme stress looking after his 68 year old, ailing mother. He admits to feeling depressed.  He is in the process of arranging for long-term care for his mother.  He denies swallowing difficulties or choking sensation  Denies fevers or chills. When he was found to have uncontrolled hypertension, he was on hydrochlorothiazide and was noted in the emergency department recently with severe hyponatremia and hypokalemia. He stopped drinking all CAFFEINATED drinks as he was told they can contribute to hypertension  REVIEW OF SYSTEMS:    Constitutional: Denies fevers, chills  Eyes: Denies blurriness of vision Ears, nose, mouth, throat, and face: Denies mucositis  Respiratory: Denies cough, dyspnea or wheezes Cardiovascular: Denies palpitation, chest discomfort or lower extremity swelling Skin: Denies abnormal skin rashes Lymphatics: Denies new lymphadenopathy or easy bruising Neurological:Denies numbness, tingling or new weaknesses Behavioral/Psych: Mood is stable, no new changes  All other systems were reviewed with the patient and are negative.  I have reviewed the past medical history, past surgical history, social history and family history with the patient and they are unchanged from previous note.  ALLERGIES:  has No Known Allergies.  MEDICATIONS:  Current Outpatient Prescriptions  Medication Sig Dispense Refill  . ALPRAZolam (XANAX) 0.5 MG tablet Take 1 tablet (0.5 mg total) by mouth at bedtime as needed for anxiety. 8 tablet 0  . hydrochlorothiazide (HYDRODIURIL) 25 MG tablet Take 1 tablet (25 mg total) by mouth daily. 30 tablet 0  . levothyroxine (SYNTHROID, LEVOTHROID) 75 MCG tablet Take 75 mcg by mouth daily.    Marland Kitchen amLODipine (NORVASC) 10 MG tablet Take 1 tablet (10 mg total) by mouth daily. 30 tablet 6  . butalbital-acetaminophen-caffeine (FIORICET, ESGIC) 50-325-40 MG tablet Take 1 tablet by mouth every 6 (six) hours as needed for headache. 30 tablet 0  . lisinopril (PRINIVIL,ZESTRIL) 5 MG tablet Take 1 tablet (5 mg total) by mouth daily. 30 tablet 1  . ondansetron (ZOFRAN) 8 MG tablet Take 1 tablet (8 mg total) by mouth every 8 (eight) hours as needed for nausea. 30 tablet 3   No current facility-administered medications for this  visit.    PHYSICAL EXAMINATION: ECOG PERFORMANCE STATUS: 1 - Symptomatic but completely ambulatory  Filed Vitals:   08/10/15 1505  BP: 161/88  Pulse: 72  Temp: 97.8 F (36.6 C)  Resp: 18   Filed Weights   08/10/15 1505  Weight: 173 lb 9.6 oz (78.744 kg)     GENERAL:alert, no distress and comfortable SKIN: skin color, texture, turgor are normal, no rashes or significant lesions EYES: normal, Conjunctiva are pink and non-injected, sclera clear OROPHARYNX:no exudate, no erythema and lips, buccal mucosa, and tongue normal  NECK:  Well-healed surgical scar. No other abnormalities LYMPH:  no palpable lymphadenopathy in the cervical, axillary or inguinal LUNGS: clear to auscultation and percussion with normal breathing effort HEART: regular rate & rhythm and no murmurs and no lower extremity edema ABDOMEN:abdomen soft, non-tender and normal bowel sounds. Palpable fullness in the epigastric region Musculoskeletal:no cyanosis of digits and no clubbing  NEURO: alert & oriented x 3 with fluent speech, no focal motor/sensory deficits. He appears depressed  LABORATORY DATA:  I have reviewed the data as listed    Component Value Date/Time   NA 125* 08/10/2015 1449   NA 126* 08/09/2015 2055   K 3.3* 08/10/2015 1449   K 3.2* 08/09/2015 2055   CL 84* 08/09/2015 2055   CO2 28 08/10/2015 1449   CO2 27 08/09/2015 2055   GLUCOSE 103 08/10/2015 1449   GLUCOSE 105* 08/09/2015 2055   BUN 13.0 08/10/2015 1449   BUN 10 08/09/2015 2055   CREATININE 0.8 08/10/2015 1449   CREATININE 0.74 08/09/2015 2055   CALCIUM 9.0 08/10/2015 1449   CALCIUM 9.1 08/09/2015 2055   PROT 7.5 08/10/2015 1449   PROT 6.9 07/19/2012 0812   ALBUMIN 4.1 08/10/2015 1449   ALBUMIN 4.0 07/19/2012 0812   AST 55* 08/10/2015 1449   AST 34 07/19/2012 0812   ALT 28 08/10/2015 1449   ALT 29 07/19/2012 0812   ALKPHOS 59 08/10/2015 1449   ALKPHOS 64 07/19/2012 0812   BILITOT 0.63 08/10/2015 1449   BILITOT 0.4 07/19/2012 0812   GFRNONAA >60 08/09/2015 2055   GFRAA >60 08/09/2015 2055    No results found for: SPEP, UPEP  Lab Results  Component Value Date   WBC 4.1 08/10/2015   NEUTROABS 2.6 08/10/2015   HGB 15.3 08/10/2015   HCT 44.5 08/10/2015   MCV 91.8 08/10/2015   PLT  209 08/10/2015      Chemistry      Component Value Date/Time   NA 125* 08/10/2015 1449   NA 126* 08/09/2015 2055   K 3.3* 08/10/2015 1449   K 3.2* 08/09/2015 2055   CL 84* 08/09/2015 2055   CO2 28 08/10/2015 1449   CO2 27 08/09/2015 2055   BUN 13.0 08/10/2015 1449   BUN 10 08/09/2015 2055   CREATININE 0.8 08/10/2015 1449   CREATININE 0.74 08/09/2015 2055      Component Value Date/Time   CALCIUM 9.0 08/10/2015 1449   CALCIUM 9.1 08/09/2015 2055   ALKPHOS 59 08/10/2015 1449   ALKPHOS 64 07/19/2012 0812   AST 55* 08/10/2015 1449   AST 34 07/19/2012 0812   ALT 28 08/10/2015 1449   ALT 29 07/19/2012 0812   BILITOT 0.63 08/10/2015 1449   BILITOT 0.4 07/19/2012 0812      ASSESSMENT & PLAN:  Tonsil cancer The patient looked depressed. He has significant symptoms with anorexia and weight loss and sensation of sore throat. His last imaging study was in December 2015 and they were  negative for cancer recurrence  However, in view of his symptoms, I cannot exclude the possibility of recurrence of cancer. I recommend imaging studies CT scan of the neck, chest, abdomen and pelvis for further evaluation and he agreed to proceed. In the meantime, I will provide supportive care  Hypothyroidism  He is visibly depressed and admitted being depressed because of extreme stress looking after his elderly mother. He is having a family meeting with other family members to arrange for other care for his ailing mother. Sometimes, inadequately treated hypothyroidism can also contribute to his symptoms.  Thyroid function panel is pending today  Essential hypertension  He has uncontrolled hypertension with headaches. He was started on hydrochlorothiazide but that has caused significant hyponatremia, hypokalemia and frequent urination.  I recommend discontinuation of hydrochlorothiazide and plans to switch him to lisinopril at night and amlodipine in the morning He has appointment to follow-up with  his primary care doctor next week  Weight loss  He has extreme stress. Other possibility include hyperthyroidism or cancer recurrence. I will proceed with imaging study for further evaluation  Other headache syndrome He has significant headaches and stress. He was prescribed Xanax but has not started taking it. The patient also stopped all caffeinated drinks recently because of hypertension.  I think withdrawal from caffeinated drinks can also cause headache. I recommended the patient a trial of short-term Fioricet  Nausea in adult  This is multi factorial. Recommend anti-emetics as needed until further evaluation as above   Orders Placed This Encounter  Procedures  . CT Soft Tissue Neck W Contrast    Standing Status: Future     Number of Occurrences:      Standing Expiration Date: 11/09/2016    Order Specific Question:  Reason for Exam (SYMPTOM  OR DIAGNOSIS REQUIRED)    Answer:  weight loss, abdominal fullness, history of tonsil ca    Order Specific Question:  Preferred imaging location?    Answer:  [facility]  . CT Abdomen Pelvis W Contrast    Standing Status: Future     Number of Occurrences:      Standing Expiration Date: 11/09/2016    Order Specific Question:  Reason for Exam (SYMPTOM  OR DIAGNOSIS REQUIRED)    Answer:  weight loss, abdominal fullness, history of tonsil ca    Order Specific Question:  Preferred imaging location?    Answer:  [facility]  . CT Chest W Contrast    Standing Status: Future     Number of Occurrences:      Standing Expiration Date: 11/09/2016    Order Specific Question:  Reason for Exam (SYMPTOM  OR DIAGNOSIS REQUIRED)    Answer:  cough, weight loss, history of tonsil ca    Order Specific Question:  Preferred imaging location?    Answer:  [facility]   All questions were answered. The patient knows to call the clinic with any problems, questions or concerns. No barriers to learning was detected. I spent 30  minutes counseling the patient face to face. The total time spent in the appointment was 40 minutes and more than 50% was on counseling and review of test results     [facility], [place], MD 08/10/2015 3:49 PM

## 2015-08-10 NOTE — Telephone Encounter (Signed)
Pt called requesting a lab/ov with Dr. Alvy Bimler. Per Dr. Alvy Bimler appt made for today @ 3:00 pm

## 2015-08-10 NOTE — Assessment & Plan Note (Signed)
He has uncontrolled hypertension with headaches. He was started on hydrochlorothiazide but that has caused significant hyponatremia, hypokalemia and frequent urination.  I recommend discontinuation of hydrochlorothiazide and plans to switch him to lisinopril at night and amlodipine in the morning He has appointment to follow-up with his primary care doctor next week

## 2015-08-10 NOTE — Telephone Encounter (Signed)
Faxed new Rxs for Lisinopril, Fioricet, Amlodipine and Zofran to [employer] [phone].  Original Rxs given to pt.Marland Kitchen

## 2015-08-11 LAB — TSH: TSH: 1.582 m(IU)/L (ref 0.320–4.118)

## 2015-08-11 LAB — T4, FREE: T4,Free(Direct): 1.42 ng/dL (ref 0.82–1.77)

## 2015-08-29 ENCOUNTER — Ambulatory Visit (HOSPITAL_COMMUNITY)
Admission: RE | Admit: 2015-08-29 | Discharge: 2015-08-29 | Disposition: A | Discharge status: Home / Self Care | Payer: Non-veteran care | Source: Ambulatory Visit | Attending: Hematology and Oncology | Admitting: Hematology and Oncology

## 2015-08-29 DIAGNOSIS — R110 Nausea: Secondary | ICD-10-CM | POA: Insufficient documentation

## 2015-08-29 DIAGNOSIS — K5730 Diverticulosis of large intestine without perforation or abscess without bleeding: Secondary | ICD-10-CM | POA: Insufficient documentation

## 2015-08-29 DIAGNOSIS — C099 Malignant neoplasm of tonsil, unspecified: Secondary | ICD-10-CM | POA: Insufficient documentation

## 2015-08-29 DIAGNOSIS — Z9889 Other specified postprocedural states: Secondary | ICD-10-CM | POA: Insufficient documentation

## 2015-08-29 DIAGNOSIS — K449 Diaphragmatic hernia without obstruction or gangrene: Secondary | ICD-10-CM | POA: Diagnosis not present

## 2015-08-29 DIAGNOSIS — R634 Abnormal weight loss: Secondary | ICD-10-CM | POA: Insufficient documentation

## 2015-08-29 DIAGNOSIS — I6521 Occlusion and stenosis of right carotid artery: Secondary | ICD-10-CM | POA: Insufficient documentation

## 2015-08-29 DIAGNOSIS — I2510 Atherosclerotic heart disease of native coronary artery without angina pectoris: Secondary | ICD-10-CM | POA: Diagnosis not present

## 2015-08-29 IMAGING — CT CT NECK W/ CM
4 of 10 series · 11 of 33 positions shown, 12 images · IV contrast (OMNIPAQUE)
Comparison: [DATE] CT. CT of the chest, abdomen, pelvis
performed same date are dictated separately.

CLINICAL DATA: 69-year-old hypertensive male with tonsil cancer
diagnosed [[facility]]. Completion of radiation therapy and prior surgery.
Weight loss over the past year. Subsequent encounter.

EXAM:
CT NECK WITH CONTRAST
TECHNIQUE: Multidetector CT imaging of the neck was performed using the
standard protocol following the bolus administration of intravenous
contrast.
CONTRAST:  100mL OMNIPAQUE IOHEXOL 300 MG/ML  SOLN

[Series 4: cap with st · axial · 0.81mm/px · z∈[-596,-376]mm · 2 of 132 slices shown, 3 images]
[im 44/132  soft-tissue]
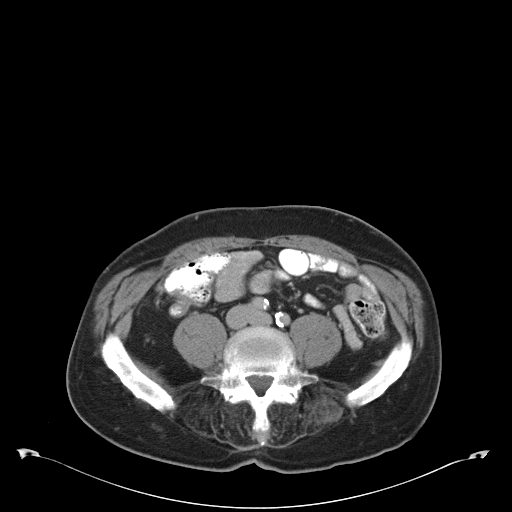
[im 44/132  bone]
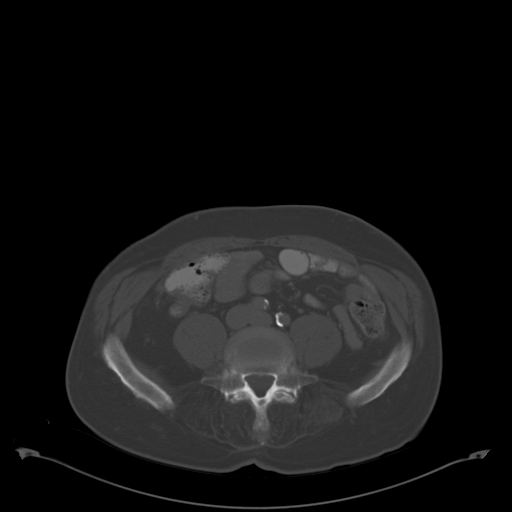
[im 88/132  bone]
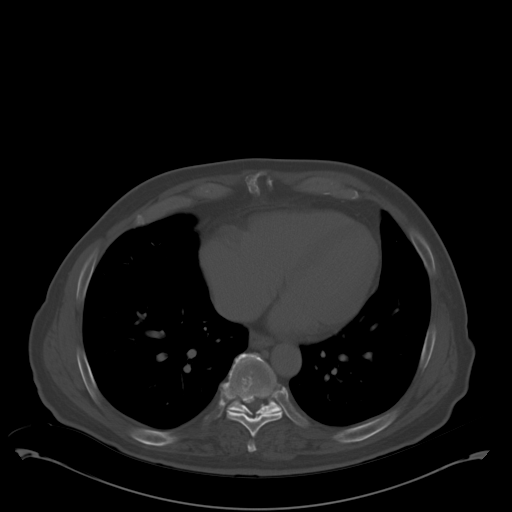

[Series 6: neck st · axial · 0.54mm/px · z∈[-202,-66]mm · 3 of 136 slices shown]
[im 34/136  bone]
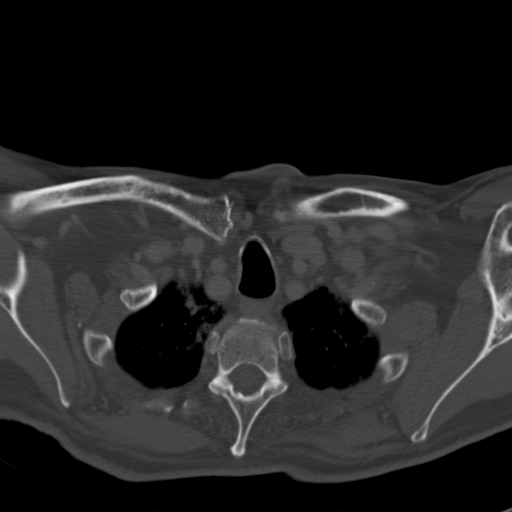
[im 68/136  bone]
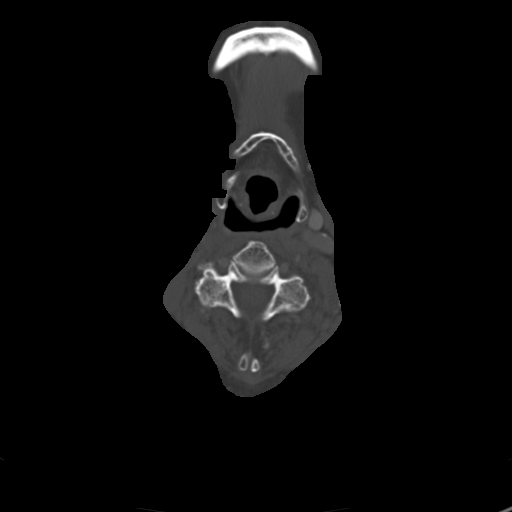
[im 102/136  bone]
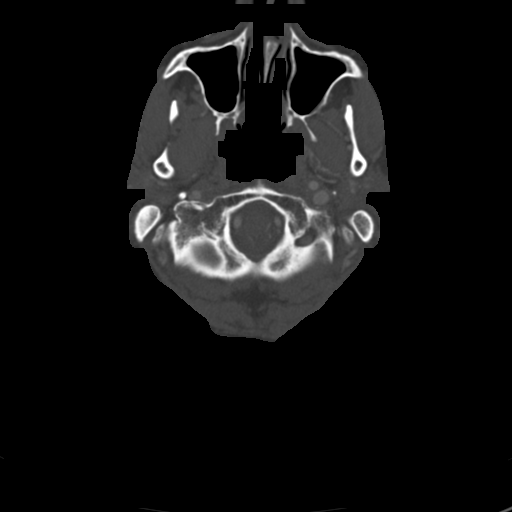

[Series 605: coronals · coronal · 1.28mm/px · 1 of 147 slices shown]
[im 74/147  bone]
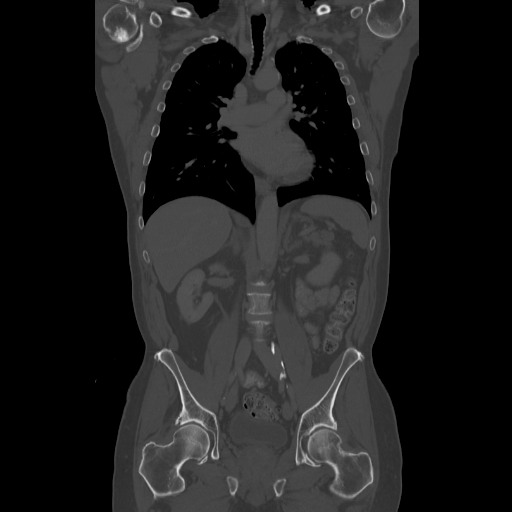

[Series 606: sagittals · sagittal · 1.28mm/px · 5 of 176 slices shown]
[im 44/176  bone]
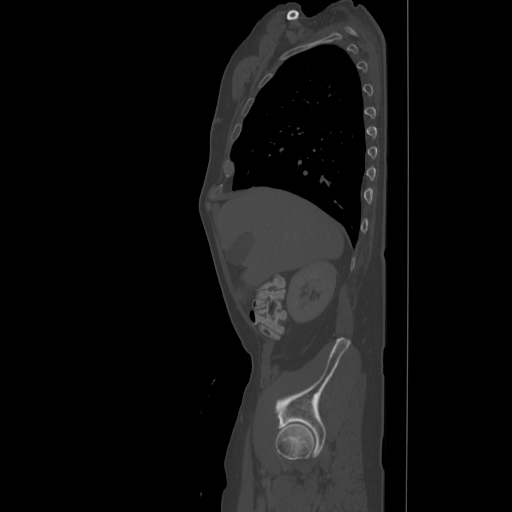
[im 66/176  bone]
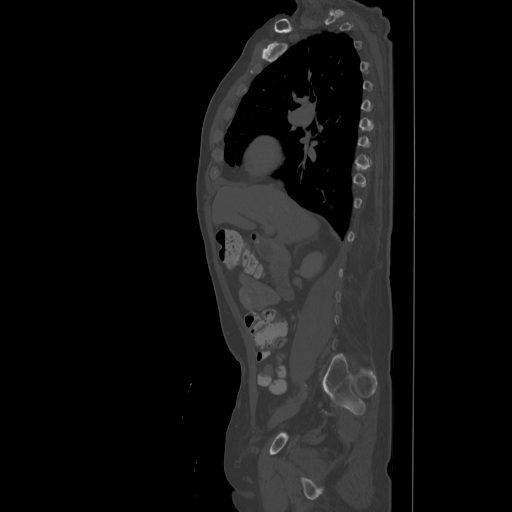
[im 88/176  bone]
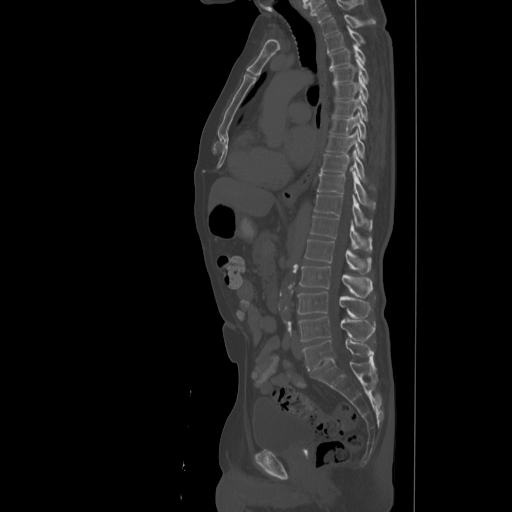
[im 110/176  bone]
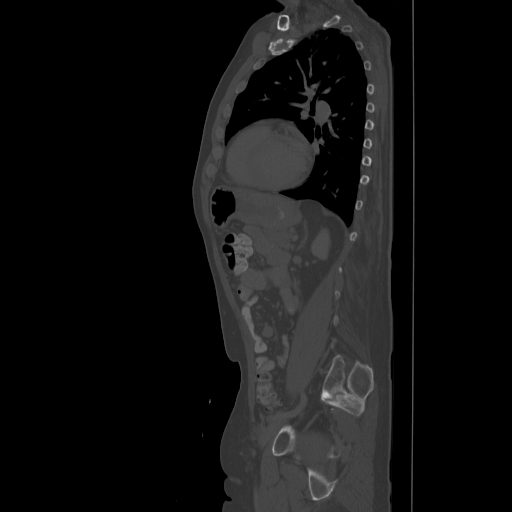
[im 132/176  bone]
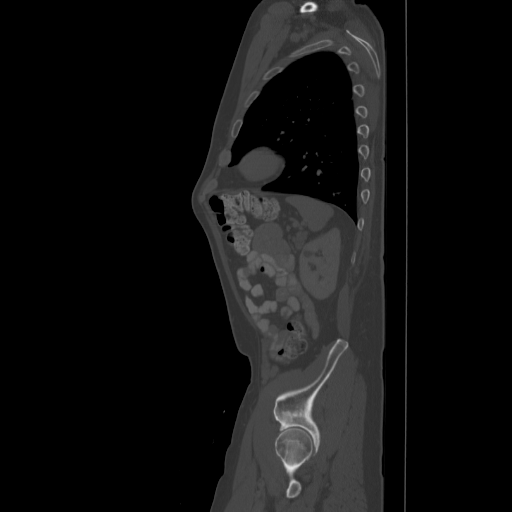

[11 of 33 positions shown; findings below may reference images not displayed]

FINDINGS: Pharynx and larynx: Post therapy changes including right neck
dissection and radiation therapy changes similar to prior exam
without recurrent mass identified.

Salivary glands: Post radiation therapy changes mid to inferior
aspect parotid glands bilaterally and submandibular gland
bilaterally.

Thyroid: Atrophic without mass and unchanged.

Lymph nodes: No adenopathy.

Vascular: Occluded right internal carotid artery once again noted.
Left carotid calcified plaque with suggestion of focal web causing
high-grade stenosis proximal left internal carotid artery with
greater than 70% diameter stenosis (see series 603, image 66).
Marked narrowing origin right vertebral artery. Mild narrowing
proximal left vertebral artery. Mild to moderate narrowing proximal
left subclavian artery. Narrowed although patent right internal
jugular vein unchanged.

Limited intracranial: No enhancing lesion.

Visualized orbits: No enhancing lesion.

Mastoids and visualized paranasal sinuses: Minimal partial
opacification mastoid air cells bilaterally. Middle ear cavities are
clear. Minimal mucosal thickening ethmoid sinus air cells and
inferior maxillary sinuses.

Skeleton: Sclerosis humeral heads greater on the right unchanged
possibly reflecting avascular necrosis rather than metastatic
disease. Cervical spondylotic changes with various degrees of spinal
stenosis and foraminal narrowing throughout the cervical spine.
Prominent degenerative changes sternoclavicular articulation.

Upper chest: Scarring lung apices.  Please see chest CT report.
IMPRESSION: Post therapy changes including right neck dissection and radiation
therapy changes similar to prior exam without recurrent mass
identified.

Occluded right internal carotid artery once again noted.

Left carotid calcified plaque with suggestion of focal web causing
high-grade stenosis proximal left internal carotid artery with
greater than 70% diameter stenosis (see series 603, image 66).

Marked narrowing origin right vertebral artery.

Mild narrowing proximal left vertebral artery.

Mild to moderate narrowing proximal left subclavian artery.

Narrowed although patent right internal jugular vein unchanged.

CT of the chest, abdomen and pelvis performed same date dictated
separately.

## 2015-08-29 MED ORDER — IOHEXOL 300 MG/ML  SOLN
100.0000 mL | Freq: Once | INTRAMUSCULAR | Status: AC | PRN
  Administered 2015-08-29: 100 mL via INTRAVENOUS

## 2015-08-30 ENCOUNTER — Telehealth: Payer: Self-pay | Admitting: Hematology and Oncology

## 2015-08-30 ENCOUNTER — Encounter: Payer: Self-pay | Admitting: Hematology and Oncology

## 2015-08-30 ENCOUNTER — Other Ambulatory Visit: Payer: Self-pay | Admitting: Hematology and Oncology

## 2015-08-30 ENCOUNTER — Ambulatory Visit: Payer: Non-veteran care | Admitting: Hematology and Oncology

## 2015-08-30 DIAGNOSIS — I6522 Occlusion and stenosis of left carotid artery: Secondary | ICD-10-CM

## 2015-08-30 DIAGNOSIS — I6529 Occlusion and stenosis of unspecified carotid artery: Secondary | ICD-10-CM

## 2015-08-30 HISTORY — DX: Occlusion and stenosis of unspecified carotid artery: I65.29

## 2015-08-30 NOTE — Telephone Encounter (Signed)
per pof to sch pt appt-per [facility] it is in [place] and they will call to sch w/pt

## 2015-08-30 NOTE — Telephone Encounter (Signed)
I reviewed the CT scan results with the patient over the telephone. Overall, he felt improved with modification to his blood pressure medication. However, he has persistent headache. CT scan showed no evidence of cancer recurrence but showed significant narrowing of the internal carotid artery on the left which I suspect is the cause of his headache. I will refer him to see a vascular surgeon for further management and recommended aspirin therapy. I recommend close follow-up with his primary care doctor and I will cancel his appointment today. I addressed all questions and concerns

## 2015-09-06 ENCOUNTER — Encounter: Payer: Self-pay | Admitting: Vascular Surgery

## 2015-09-14 ENCOUNTER — Ambulatory Visit (INDEPENDENT_AMBULATORY_CARE_PROVIDER_SITE_OTHER): Payer: No Typology Code available for payment source | Admitting: Vascular Surgery

## 2015-09-14 ENCOUNTER — Encounter: Payer: Self-pay | Admitting: Vascular Surgery

## 2015-09-14 VITALS — BP 133/76 | HR 76 | Temp 97.6°F | Resp 16 | Ht 68.0 in | Wt 173.0 lb

## 2015-09-14 DIAGNOSIS — I779 Disorder of arteries and arterioles, unspecified: Secondary | ICD-10-CM | POA: Diagnosis not present

## 2015-09-14 DIAGNOSIS — I739 Peripheral vascular disease, unspecified: Principal | ICD-10-CM

## 2015-09-14 NOTE — Progress Notes (Signed)
New Carotid Patient  Referred by:  Windy Fast, MD [place],  [place] [id number]  Reason for referral: R ICA occlusion, L carotid stenosis  History of Present Illness  Alexander Duran is a 70 y.o. (1946-04-02) male s/p neck dissection and XRT for tonsillar cancer who presents with chief complaint: neck tightness.  The patient recently had a soft tissue CT of neck to evaluate for any recurrence of his tonsillar cancer.  He has been losing muscle and weight over the last year.  This CT suggested RICA occlusion and possible L ICA stenosis >70%.  Patient has no history of TIA or stroke symptom.  The patient has never had amaurosis fugax or monocular blindness.  The patient has never had facial drooping or hemiplegia.  The patient has never had receptive or expressive aphasia.   The patient's risks factors for carotid disease include: HTN, prior smoking.  Past Medical History  Diagnosis Date  . Hypertension   . Thyroid disease   . Hypothyroidism 05/09/2014  . Cancer ([place])     Tonsil cancer  . Weight loss 08/08/2015  . Other headache syndrome 08/10/2015  . Nausea in adult 08/10/2015  . Carotid artery stenosis without cerebral infarction 08/30/2015    Past Surgical History  Procedure Laterality Date  . Tumor removal      in neck  . Back surgery      Social History   Social History  . Marital Status: Married    Spouse Name: N/A  . Number of Children: N/A  . Years of Education: N/A   Occupational History  . Not on file.   Social History Main Topics  . Smoking status: Former Smoker -- 1.00 packs/day for 25 years    Quit date: 05/04/2001  . Smokeless tobacco: Never Used  . Alcohol Use: 1.2 oz/week    2 Shots of liquor per week     Comment: rarely  . Drug Use: No  . Sexual Activity: Not on file   Other Topics Concern  . Not on file   Social History Narrative    Family History  Problem Relation Age of Onset  . Cancer Father     lung cancer  . COPD  Mother   . Macular degeneration Mother     Current Outpatient Prescriptions  Medication Sig Dispense Refill  . ALPRAZolam (XANAX) 0.5 MG tablet Take 1 tablet (0.5 mg total) by mouth at bedtime as needed for anxiety. 8 tablet 0  . amLODipine (NORVASC) 10 MG tablet Take 1 tablet (10 mg total) by mouth daily. 30 tablet 6  . DULoxetine (CYMBALTA) 30 MG capsule Take 30 mg by mouth daily.    Marland Kitchen levothyroxine (SYNTHROID, LEVOTHROID) 75 MCG tablet Take 75 mcg by mouth daily.    Marland Kitchen lisinopril (PRINIVIL,ZESTRIL) 5 MG tablet Take 1 tablet (5 mg total) by mouth daily. 30 tablet 1  . butalbital-acetaminophen-caffeine (FIORICET, ESGIC) 50-325-40 MG tablet Take 1 tablet by mouth every 6 (six) hours as needed for headache. (Patient not taking: Reported on 09/14/2015) 30 tablet 0  . hydrochlorothiazide (HYDRODIURIL) 25 MG tablet Take 1 tablet (25 mg total) by mouth daily. (Patient not taking: Reported on 09/14/2015) 30 tablet 0  . ondansetron (ZOFRAN) 8 MG tablet Take 1 tablet (8 mg total) by mouth every 8 (eight) hours as needed for nausea. (Patient not taking: Reported on 09/14/2015) 30 tablet 3   No current facility-administered medications for this visit.    No Known Allergies  REVIEW OF SYSTEMS:  (Positives checked otherwise negative)  CARDIOVASCULAR:   [x]  chest pain,  [x]  chest pressure,  [ ]  palpitations,  [ ]  shortness of breath when laying flat,  [ ]  shortness of breath with exertion,   [ ]  pain in feet when walking,  [ ]  pain in feet when laying flat, [ ]  history of blood clot in veins (DVT),  [ ]  history of phlebitis,  [ ]  swelling in legs,  [ ]  varicose veins  PULMONARY:   [ ]  productive cough,  [ ]  asthma,  [ ]  wheezing  NEUROLOGIC:   [ ]  weakness in arms or legs,  [ ]  numbness in arms or legs,  [ ]  difficulty speaking or slurred speech,  [ ]  temporary loss of vision in one eye,  [x]  dizziness  HEMATOLOGIC:   [ ]  bleeding problems,  [ ]  problems with blood clotting too  easily  MUSCULOSKEL:   [ ]  joint pain, [ ]  joint swelling  GASTROINTEST:   [ ]  vomiting blood,  [ ]  blood in stool     GENITOURINARY:   [ ]  burning with urination,  [ ]  blood in urine  PSYCHIATRIC:   [ ]  history of major depression  INTEGUMENTARY:   [ ]  rashes,  [ ]  ulcers  CONSTITUTIONAL:   [x]  fever,  [x]  chills   For VQI Use Only  PRE-ADM LIVING: Home  AMB STATUS: Ambulatory  CAD Sx: None  PRIOR CHF: None  STRESS TEST: [x]  No, [ ]  Normal, [ ]  + ischemia, [ ]  + MI, [ ]  Both   Physical Examination  Filed Vitals:   09/14/15 1400 09/14/15 1403  BP: 135/75 133/76  Pulse: 76 76  Temp: 97.6 F (36.4 C)   Resp: 16   Height: 5\' 8"  (1.727 m)   Weight: 173 lb (78.472 kg)   SpO2: 96%    Body mass index is 26.31 kg/(m^2).  General: A&O x 3, WDWN  Head: [place]/AT  Ear/Nose/Throat: Hearing grossly intact, nares w/o erythema or drainage, oropharynx w/o Erythema/Exudate, Mallampati score: 3  Eyes: PERRLA, EOMI  Neck: Supple, no nuchal rigidity, no palpable LAD, obvious post-surgical defect in right neck  Pulmonary: Sym exp, good air movt, CTAB, no rales, rhonchi, & wheezing  Cardiac: RRR, Nl S1, S2, no Murmurs, rubs or gallops  Vascular: Vessel Right Left  Radial Palpable Palpable  Brachial Palpable Palpable  Carotid Palpable, without bruit Palpable, without bruit  Aorta  Not palpable N/A  Femoral Palpable Palpable  Popliteal Not palpable Not palpable  PT Faintly Palpable Faintly Palpable  DP Faintly Palpable Faintly Palpable   Gastrointestinal: soft, NTND, -G/R, - HSM, - masses, - CVAT B  Musculoskeletal: M/S 5/5 throughout , Extremities without ischemic changes   Neurologic: CN 2-12 intact , Pain and light touch intact in extremities , Motor exam as listed above  Psychiatric: Judgment intact, Mood & affect appropriate for pt's clinical situation  Dermatologic: See M/S exam for extremity exam, no rashes otherwise noted  Lymph : No Cervical,  Axillary, or Inguinal lymphadenopathy   CT Soft tissue neck (08/29/15) Post therapy changes including right neck dissection and radiation therapy changes similar to prior exam without recurrent mass identified.  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  Left carotid calcified plaque with suggestion of focal web causing high-grade stenosis proximal left internal carotid artery with greater than 70% diameter stenosis (see series 603, image 66).  Marked narrowing origin right vertebral artery.  Mild narrowing proximal  left vertebral artery.  Mild to moderate narrowing proximal left subclavian artery.  Narrowed although patent right internal jugular vein unchanged.  CT of the chest, abdomen and pelvis performed same date dictated separately.  Based on my review of the CTA, I don't think the contrast was adequately timed to fully evaluate the arterial anatomy of the neck   Outside Studies/Documentation 20 pages of outside documents were reviewed including: outside PCP chart.   Medical Decision Making  Alexander Duran is a 70 y.o. male who presents with: possible RICA occlusion, possible as ICA stenosis >70%.   Pt currently asx, so R ICA occlusion would be an incidental findings.  L ICA stenosis isn't obvious to me, as the appropriate slice of the neck CT are not available for review.  Based on the patient's vascular studies and examination, I have offered the patient: B carotid duplex.  He will be scheduled for this in the next month, after which he will see me to discuss the findings.  I discussed in depth with the patient the nature of atherosclerosis, and emphasized the importance of maximal medical management including strict control of blood pressure, blood glucose, and lipid levels, obtaining regular exercise, antiplatelet agents, and cessation of smoking.   The patient is currently not on a statin.  I recommended he get a lipid panel  completed.  The patient is currently not on an anti-platelet.  The patient will be started on ASA 81 mg PO daily.  The patient is aware that without maximal medical management the underlying atherosclerotic disease process will progress, limiting the benefit of any interventions.  Thank you for allowing [place] to participate in this patient's care.   Adele Barthel, MD [facility] Office: [phone] Pager: [phone]  09/14/2015, 2:26 PM

## 2015-09-15 ENCOUNTER — Encounter: Payer: Self-pay | Admitting: Internal Medicine

## 2015-10-19 ENCOUNTER — Other Ambulatory Visit: Payer: Self-pay | Admitting: *Deleted

## 2015-10-19 DIAGNOSIS — I6523 Occlusion and stenosis of bilateral carotid arteries: Secondary | ICD-10-CM

## 2015-10-20 ENCOUNTER — Encounter: Payer: Self-pay | Admitting: Vascular Surgery

## 2015-10-20 ENCOUNTER — Ambulatory Visit (HOSPITAL_COMMUNITY)
Admission: RE | Admit: 2015-10-20 | Discharge: 2015-10-20 | Disposition: A | Discharge status: Home / Self Care | Payer: No Typology Code available for payment source | Source: Ambulatory Visit | Attending: Vascular Surgery | Admitting: Vascular Surgery

## 2015-10-20 DIAGNOSIS — I6523 Occlusion and stenosis of bilateral carotid arteries: Secondary | ICD-10-CM | POA: Diagnosis not present

## 2015-10-20 DIAGNOSIS — I10 Essential (primary) hypertension: Secondary | ICD-10-CM | POA: Insufficient documentation

## 2015-10-26 NOTE — Progress Notes (Signed)
Established Carotid Patient  History of Present Illness  Alexander Duran is a 70 y.o. ([birth year]) male who presents with chief complaint: return for B carotid duplex. Previous carotid studies demonstrated: RICA occluded, LICA [id number] stenosis.  This was on a CTA, but the timing was poor so I did not find the read [id number] LICA stenosis.  I recommended repeating the study.  Patient has no history of TIA or stroke symptom.  The patient has never had amaurosis fugax or monocular blindness.  The patient has never had facial drooping or hemiplegia.  The patient has never had receptive or expressive aphasia.     The patient's PMH, PSH, SH, and FamHx are unchanged from 09/14/15.  Current Outpatient Prescriptions  Medication Sig Dispense Refill  . ALPRAZolam (XANAX) 0.5 MG tablet Take 1 tablet (0.5 mg total) by mouth at bedtime as needed for anxiety. 8 tablet 0  . amLODipine (NORVASC) 10 MG tablet Take 1 tablet (10 mg total) by mouth daily. 30 tablet 6  . butalbital-acetaminophen-caffeine (FIORICET, ESGIC) 50-325-40 MG tablet Take 1 tablet by mouth every 6 (six) hours as needed for headache. (Patient not taking: Reported on 09/14/2015) 30 tablet 0  . DULoxetine (CYMBALTA) 30 MG capsule Take 30 mg by mouth daily.    . hydrochlorothiazide (HYDRODIURIL) 25 MG tablet Take 1 tablet (25 mg total) by mouth daily. (Patient not taking: Reported on 09/14/2015) 30 tablet 0  . levothyroxine (SYNTHROID, LEVOTHROID) 75 MCG tablet Take 75 mcg by mouth daily.    Marland Kitchen lisinopril (PRINIVIL,ZESTRIL) 5 MG tablet Take 1 tablet (5 mg total) by mouth daily. 30 tablet 1  . ondansetron (ZOFRAN) 8 MG tablet Take 1 tablet (8 mg total) by mouth every 8 (eight) hours as needed for nausea. (Patient not taking: Reported on 09/14/2015) 30 tablet 3   No current facility-administered medications for this visit.    No Known Allergies  On ROS today: no CVA or TIA, +HA   Physical Examination  Filed Vitals:   10/27/15 1359 10/27/15 1404    BP: 126/72 115/74  Pulse: 68   Height: 5\' 8"  (1.727 m)   Weight: 175 lb (79.379 kg)   SpO2: 94%    Body mass index is 26.61 kg/(m^2).   General: A&O x 3, WDWN  Eyes: PERRLA, EOMI  Neck: Supple, no nuchal rigidity, no palpable LAD, obvious post-surgical defect in right neck  Pulmonary: Sym exp, good air movt, CTAB, no rales, rhonchi, & wheezing  Cardiac: RRR, Nl S1, S2, no Murmurs, rubs or gallops  Vascular: Vessel Right Left  Radial Palpable Palpable  Brachial Palpable Palpable  Carotid Palpable, without bruit Palpable, without bruit  Aorta Not palpable N/A  Femoral Palpable Palpable  Popliteal Not palpable Not palpable  PT Faintly Palpable Faintly Palpable  DP Faintly Palpable Faintly Palpable   Gastrointestinal: soft, NTND, -G/R, - HSM, - masses, - CVAT B  Musculoskeletal: M/S 5/5 throughout , Extremities without ischemic changes   Neurologic: CN 2-12 intact , Pain and light touch intact in extremities , Motor exam as listed above   Non-Invasive Vascular Imaging  CAROTID DUPLEX (Date: 10/20/15):   R ICA stenosis: occluded  R VA: patent and antegrade  L ICA stenosis: 80-99% (does not correlate to B-mode)  L VA: patent and antegrade   Medical Decision Making  MEET Alexander Duran is a 70 y.o. male who presents with: asx R ICA occlusion, L ICA 50-69% stenosis (exact degree unknown)   This patient's LICA has higher velocities likely  due to tortuousity and compensatory flow.  On B-mode, the >80% stenosis cannot be found.  Based on the patient's vascular studies and examination, I have offered the patient: q6 month B carotid duplex.  I discussed in depth with the patient the nature of atherosclerosis, and emphasized the importance of maximal medical management including strict control of blood pressure, blood glucose, and lipid levels, antiplatelet agents, obtaining regular exercise, and cessation of smoking.    The patient is aware that  without maximal medical management the underlying atherosclerotic disease process will progress, limiting the benefit of any interventions. The patient is currently not on a statin: as not medical inidicated. The patient is currently not on an anti-platelet.  The patient will be started on ASA 81 mg PO daily.  Thank you for allowing [place] to participate in this patient's care.   Adele Barthel, MD [facility] Office: [phone] Pager: [phone]  10/26/2015, 10:19 PM

## 2015-10-27 ENCOUNTER — Encounter: Payer: Self-pay | Admitting: Vascular Surgery

## 2015-10-27 ENCOUNTER — Ambulatory Visit (INDEPENDENT_AMBULATORY_CARE_PROVIDER_SITE_OTHER): Payer: No Typology Code available for payment source | Admitting: Vascular Surgery

## 2015-10-27 VITALS — BP 115/74 | HR 68 | Ht 68.0 in | Wt 175.0 lb

## 2015-10-27 DIAGNOSIS — I6523 Occlusion and stenosis of bilateral carotid arteries: Secondary | ICD-10-CM | POA: Diagnosis not present

## 2015-10-27 DIAGNOSIS — I779 Disorder of arteries and arterioles, unspecified: Secondary | ICD-10-CM

## 2015-10-27 DIAGNOSIS — I739 Peripheral vascular disease, unspecified: Principal | ICD-10-CM

## 2016-01-16 ENCOUNTER — Other Ambulatory Visit: Payer: Self-pay | Admitting: *Deleted

## 2016-01-16 DIAGNOSIS — I6523 Occlusion and stenosis of bilateral carotid arteries: Secondary | ICD-10-CM

## 2016-04-01 ENCOUNTER — Telehealth: Payer: Self-pay | Admitting: *Deleted

## 2016-04-01 NOTE — Telephone Encounter (Signed)
On 04-01-16 [facility] it was consult note, end of tx note, sim & planning note

## 2016-05-03 ENCOUNTER — Ambulatory Visit: Payer: Self-pay | Admitting: Vascular Surgery

## 2016-05-03 ENCOUNTER — Encounter (HOSPITAL_COMMUNITY): Payer: Self-pay

## 2016-05-15 ENCOUNTER — Encounter: Payer: Self-pay | Admitting: Vascular Surgery

## 2016-05-16 NOTE — Progress Notes (Signed)
Established Carotid Patient  History of Present Illness  Alexander Duran is a 70 y.o. (1945-06-28) male who presents with chief complaint: intermittent numbness in  bilateral hands.  Pt notes at night times intermittently bilateral numbness in both hands usually his 1st-3rd fingers bilaterally.  He denies any change in hand grip strength or upper arm strength.  Previous carotid studies demonstrated: RICA occluded stenosis, LICA [id number] by velocity without plaque morphology consistent with such.  Patient has no history of TIA or stroke symptom.  The patient has never had amaurosis fugax or monocular blindness.  The patient has never had facial drooping or hemiplegia.  The patient has never had receptive or expressive aphasia.    The patient's PMH, PSH, SH, and FamHx are unchanged from 10/26/15.  Current Outpatient Prescriptions  Medication Sig Dispense Refill  . ALPRAZolam (XANAX) 0.5 MG tablet Take 1 tablet (0.5 mg total) by mouth at bedtime as needed for anxiety. 8 tablet 0  . amLODipine (NORVASC) 10 MG tablet Take 1 tablet (10 mg total) by mouth daily. 30 tablet 6  . butalbital-acetaminophen-caffeine (FIORICET, ESGIC) 50-325-40 MG tablet Take 1 tablet by mouth every 6 (six) hours as needed for headache. (Patient not taking: Reported on 09/14/2015) 30 tablet 0  . DULoxetine (CYMBALTA) 30 MG capsule Take 30 mg by mouth daily.    . hydrochlorothiazide (HYDRODIURIL) 25 MG tablet Take 1 tablet (25 mg total) by mouth daily. (Patient not taking: Reported on 09/14/2015) 30 tablet 0  . levothyroxine (SYNTHROID, LEVOTHROID) 75 MCG tablet Take 75 mcg by mouth daily.    Marland Kitchen lisinopril (PRINIVIL,ZESTRIL) 5 MG tablet Take 1 tablet (5 mg total) by mouth daily. 30 tablet 1  . ondansetron (ZOFRAN) 8 MG tablet Take 1 tablet (8 mg total) by mouth every 8 (eight) hours as needed for nausea. (Patient not taking: Reported on 09/14/2015) 30 tablet 3   No current facility-administered medications for this visit.      No Known Allergies  On ROS today: intermittent hand numbness, no CVA of TIA sx   Physical Examination  Vitals:   05/17/16 1536 05/17/16 1541 05/17/16 1542  BP: (!) 151/81 (!) 146/80 133/80  Pulse: 63 65 65  Resp: 16    Temp: 97.4 F (36.3 C)    SpO2: 97%    Weight: 185 lb (83.9 kg)    Height: 5\' 8"  (1.727 m)      Body mass index is 28.13 kg/m.   General: A&O x 3, WDWN  Eyes: PERRLA, EOMI  Neck: Supple, no nuchal rigidity, no palpable LAD, obvious post-surgical defect in right neck, some mild contracture of the neck  Pulmonary: Sym exp, good air movt, CTAB, no rales, rhonchi, & wheezing  Cardiac: RRR, Nl S1, S2, no Murmurs, rubs or gallops  Vascular: Vessel Right Left  Radial Palpable Palpable  Brachial Palpable Palpable  Carotid Palpable, without bruit Palpable, without bruit  Aorta Not palpable N/A  Femoral Palpable Palpable  Popliteal Not palpable Not palpable  PT Faintly Palpable Faintly Palpable  DP Faintly Palpable Faintly Palpable   Gastrointestinal: soft, NTND, -G/R, - HSM, - masses, - CVAT B  Musculoskeletal: M/S 5/5 throughout , Extremities without ischemic changes   Neurologic: CN 2-12 intact , Pain and light touch intact in extremities , Motor exam as listed above   Non-Invasive Vascular Imaging  CAROTID DUPLEX (Date: 05/17/2016):   R ICA stenosis: occluded  R VA:  patent and antegrade  L ICA stenosis: 80-99% (does not correspond to  visualized plaque)  L VA: patent and antegrade   Medical Decision Making  Alexander Duran is a 70 y.o. male who presents with: known R ICA occlusion, asx L ICA stenosis unknown degree, s/p tonsillar cancer resection, possible cervical radicular sx   Pt sx are not consistent with a CVA or TIA, but might be related to a small HNP.  Based on the patient's vascular studies and examination, I have offered the patient: CTA Neck.  Should be able to reformat the same images  to evaluate the cervical spine also.  The patient will follow up in 4 weeks with the above study.  I discussed in depth with the patient the nature of atherosclerosis, and emphasized the importance of maximal medical management including strict control of blood pressure, blood glucose, and lipid levels, antiplatelet agents, obtaining regular exercise, and cessation of smoking.    The patient is aware that without maximal medical management the underlying atherosclerotic disease process will progress, limiting the benefit of any interventions. The patient is currently not on statin as not medically indicated The patient is currently on an anti-platelet: ASA.  Thank you for allowing [place] to participate in this patient's care.   Adele Barthel, MD, FACS [facility] Office: [phone] Pager: [phone]

## 2016-05-17 ENCOUNTER — Ambulatory Visit (INDEPENDENT_AMBULATORY_CARE_PROVIDER_SITE_OTHER): Payer: No Typology Code available for payment source | Admitting: Vascular Surgery

## 2016-05-17 ENCOUNTER — Encounter: Payer: Self-pay | Admitting: Vascular Surgery

## 2016-05-17 ENCOUNTER — Ambulatory Visit (HOSPITAL_COMMUNITY)
Admission: RE | Admit: 2016-05-17 | Discharge: 2016-05-17 | Disposition: A | Discharge status: Home / Self Care | Payer: No Typology Code available for payment source | Source: Ambulatory Visit | Attending: Vascular Surgery | Admitting: Vascular Surgery

## 2016-05-17 VITALS — BP 133/80 | HR 65 | Temp 97.4°F | Resp 16 | Ht 68.0 in | Wt 185.0 lb

## 2016-05-17 DIAGNOSIS — I739 Peripheral vascular disease, unspecified: Secondary | ICD-10-CM

## 2016-05-17 DIAGNOSIS — C099 Malignant neoplasm of tonsil, unspecified: Secondary | ICD-10-CM

## 2016-05-17 DIAGNOSIS — I779 Disorder of arteries and arterioles, unspecified: Secondary | ICD-10-CM

## 2016-05-17 DIAGNOSIS — I6523 Occlusion and stenosis of bilateral carotid arteries: Secondary | ICD-10-CM | POA: Insufficient documentation

## 2016-05-17 LAB — VAS US CAROTID
LCCADDIAS: 25 cm/s
LCCADSYS: 72 cm/s
LEFT ECA DIAS: 28 cm/s
LEFT VERTEBRAL DIAS: 22 cm/s
LICADDIAS: 25 cm/s
LICADSYS: 73 cm/s
Left CCA prox sys: 154 cm/s
RIGHT CCA MID DIAS: 22 cm/s
RIGHT ECA DIAS: -29 cm/s
RIGHT VERTEBRAL DIAS: -26 cm/s

## 2016-05-20 NOTE — Addendum Note (Signed)
Addended by: Lianne Cure A on: 05/20/2016 08:16 AM   Modules accepted: Orders

## 2016-06-07 ENCOUNTER — Encounter: Payer: Self-pay | Admitting: Vascular Surgery

## 2016-06-18 NOTE — Progress Notes (Signed)
Established Carotid Patient  History of Present Illness  Alexander Duran is a 71 y.o. (25-May-1946) male who presents with chief complaint:  F/u neck CTA.  His current sx are: none.  Previous carotid studies demonstrated: RICA occluded stenosis, LICA [id number] by velocity without plaque morphology consistent with such.  Patient has no history of TIA or stroke symptom.  The patient has never had amaurosis fugax or monocular blindness.  The patient has never had facial drooping or hemiplegia.  The patient has never had receptive or expressive aphasia.    The patient's PMH, PSH, SH, and FamHx are unchanged from 05/17/16  Current Outpatient Prescriptions  Medication Sig Dispense Refill  . ALPRAZolam (XANAX) 0.5 MG tablet Take 1 tablet (0.5 mg total) by mouth at bedtime as needed for anxiety. (Patient not taking: Reported on 05/17/2016) 8 tablet 0  . amLODipine (NORVASC) 10 MG tablet Take 1 tablet (10 mg total) by mouth daily. 30 tablet 6  . aspirin 81 MG chewable tablet Chew 81 mg by mouth daily.    . butalbital-acetaminophen-caffeine (FIORICET, ESGIC) 50-325-40 MG tablet Take 1 tablet by mouth every 6 (six) hours as needed for headache. (Patient not taking: Reported on 05/17/2016) 30 tablet 0  . DULoxetine (CYMBALTA) 30 MG capsule Take 30 mg by mouth daily.    . hydrochlorothiazide (HYDRODIURIL) 25 MG tablet Take 1 tablet (25 mg total) by mouth daily. (Patient not taking: Reported on 05/17/2016) 30 tablet 0  . HYDROcodone-acetaminophen (NORCO/VICODIN) 5-325 MG tablet Take 1 tablet by mouth daily as needed for moderate pain.    Marland Kitchen levothyroxine (SYNTHROID, LEVOTHROID) 75 MCG tablet Take 75 mcg by mouth daily.    Marland Kitchen lisinopril (PRINIVIL,ZESTRIL) 5 MG tablet Take 1 tablet (5 mg total) by mouth daily. 30 tablet 1  . ondansetron (ZOFRAN) 8 MG tablet Take 1 tablet (8 mg total) by mouth every 8 (eight) hours as needed for nausea. (Patient not taking: Reported on 05/17/2016) 30 tablet 3   No current  facility-administered medications for this visit.    On ROS today: continued intermittent hand numbness, no CVA of TIA sx   Physical Examination  Vitals:   06/21/16 1404 06/21/16 1409  BP: (!) 146/89 (!) 145/90  Pulse: 73   Resp: 16   Temp: 97.8 F (36.6 C)   TempSrc: Oral   SpO2: 92%   Weight: 186 lb (84.4 kg)   Height: 5\' 8"  (1.727 m)     Body mass index is 28.28 kg/m.  General: A&O x 3, WDWN  Eyes: PERRLA, EOMI  Neck: Supple, no nuchal rigidity, no palpable LAD, obvious post-surgical defect in right neck, some mild contracture of the neck  Pulmonary: Sym exp, good air movt, CTAB, no rales, rhonchi, & wheezing  Cardiac: RRR, Nl S1, S2, no Murmurs, rubs or gallops  Vascular: Vessel Right Left  Radial Palpable Palpable  Brachial Palpable Palpable  Carotid Palpable, without bruit Palpable, without bruit  Aorta Not palpable N/A  Femoral Palpable Palpable  Popliteal Not palpable Not palpable  PT Faintly Palpable Faintly Palpable  DP Faintly Palpable Faintly Palpable   Gastrointestinal: soft, NTND, -G/R, - HSM, - masses, - CVAT B  Musculoskeletal: M/S 5/5 throughout , Extremities without ischemic changes   Neurologic: CN 2-12 intact , Pain and light touch intact in extremities , Motor exam as listed above  CTA Neck (06/19/16) 1. Chronic occlusion of the right ICA. Chronic soft and calcified atherosclerotic plaque at the left ICA origin and bulb. Tortuosity an web-like  filling defect at the distal bulb resulting in stenosis estimated at 60-65% (series 603, image 114). This appears stable since the 2017 restaging neck CT, but might have progressed since 2015. 2. 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 3. Stable neck soft tissues status post modified radical right neck dissection.  Based on my review of the CTA, this patient's L carotid artery is inadequately  imaged.  There appears to be 50-75% stenosis on some images, but the lesion is at an angulated segment of the LICA so I have concerns that reconstruction artifact may be leading to overestimation of the stenosis.   Medical Decision Making  Alexander Duran is a 71 y.o. ([birth year]) male who presents with: known R ICA occlusion, asx L ICA stenosis unknown degree, s/p tonsillar cancer resection with B neck XRT, possible cervical radicular sx   I suspect carotid duplex will continue to be difficult in this patient, so B carotid and cerebral angiography will be essential in determining the degree of carotid disease in this patient.  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  His R ICA occluded suddenly and unexpectedly.  A repeat on the left side might be devastating for this patient. I discussed with the patient the nature of angiographic procedures, especially the limited patencies of any endovascular intervention.   The patient is aware of that the risks of an angiographic procedure include but are not limited to: bleeding, infection, access site complications, renal failure, embolization, rupture of vessel, dissection, arteriovenous fistula, possible need for emergent surgical intervention, possible need for surgical procedures to treat the patient's pathology, anaphylactic reaction to contrast, and stroke and death.   The patient is aware of the stroke risk for this procedure. The patient is aware of the risks and agrees to proceed.  He will be scheduled for the 18 JAN 18.  I discussed in depth with the patient the nature of atherosclerosis, and emphasized the importance of maximal medical management including strict control of blood pressure, blood glucose, and lipid levels, antiplatelet agents, obtaining regular exercise, and cessation of smoking.    The patient is aware that without maximal medical  management the underlying atherosclerotic disease process will progress, limiting the benefit of any interventions.  The patient is currently not on statin as not medically indicated  The patient is currently on an anti-platelet: ASA.  Thank you for allowing [place] to participate in this patient's care.   Adele Barthel, MD, FACS [facility] Office: [phone] Pager: [phone]

## 2016-06-19 ENCOUNTER — Ambulatory Visit
Admission: RE | Admit: 2016-06-19 | Discharge: 2016-06-19 | Disposition: A | Discharge status: Home / Self Care | Payer: Non-veteran care | Source: Ambulatory Visit | Attending: Vascular Surgery | Admitting: Vascular Surgery

## 2016-06-19 DIAGNOSIS — I779 Disorder of arteries and arterioles, unspecified: Secondary | ICD-10-CM

## 2016-06-19 DIAGNOSIS — I739 Peripheral vascular disease, unspecified: Principal | ICD-10-CM

## 2016-06-19 IMAGING — CT CT ANGIO NECK
1 of 8 series · 6 of 33 positions shown · IV contrast ([ID] ISOVUE 370)
Comparison: Restaging neck CTs [DATE] and earlier

CLINICAL DATA: 70-year-old male with neck pain. Right side
tonsillar carcinoma status post radiation and surgery. Chronic right
ICA occlusion. Left ICA atherosclerosis and stenosis. Subsequent
encounter.

Creatinine was obtained on site at [HOSPITAL] at [REDACTED].
Results: Creatinine 0.7 mg/dL.
EXAM:
CT ANGIOGRAPHY NECK
TECHNIQUE: Multidetector CT imaging of the neck was performed using the
standard protocol during bolus administration of intravenous
contrast. Multiplanar CT image reconstructions and MIPs were
obtained to evaluate the vascular anatomy. Carotid stenosis
measurements (when applicable) are obtained utilizing NASCET
criteria, using the distal internal carotid diameter as the
denominator.
CONTRAST:  100 mL Isovue 370

[Series 401: axial thin · axial · 0.49mm/px · z∈[+82,+257]mm · 6 of 242 slices shown]
[im 35/242  soft-tissue]
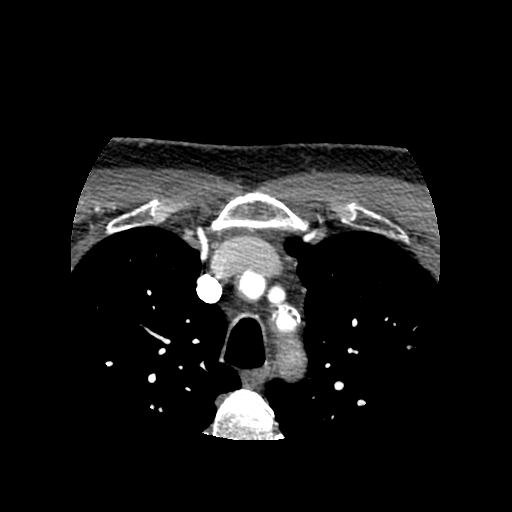
[im 69/242  bone]
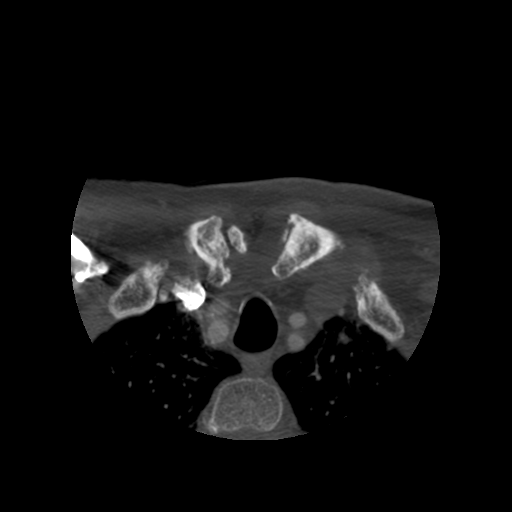
[im 104/242  soft-tissue]
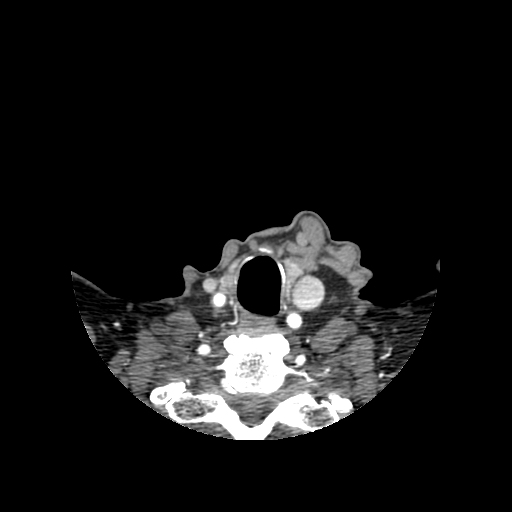
[im 138/242  bone]
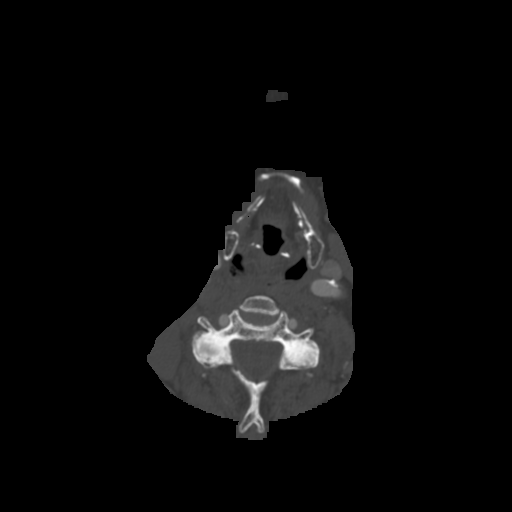
[im 173/242  soft-tissue]
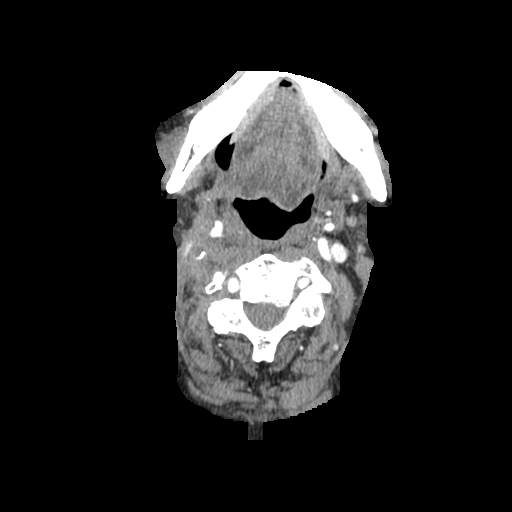
[im 207/242  bone]
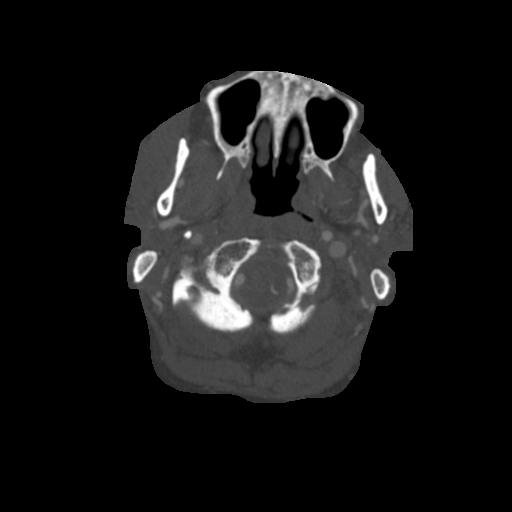

[6 of 33 positions shown; findings below may reference images not displayed]

FINDINGS: Skeleton: Chronic cervical spine degeneration and upper cervical
posterior element ankylosis greater on the right. No acute or
suspicious osseous lesion identified.

Visualized paranasal sinuses and mastoids are stable and well
pneumatized. Mild inferior right mastoid effusion/opacification is
unchanged.

Upper chest: Lung apices are stable with minimal apical radiation
scarring. No superior mediastinal lymphadenopathy.

Other neck: Stable diminutive thyroid. Negative larynx. Mild
thickening of the epiglottis is post treatment related and stable.
Pharyngeal soft tissue contours are stable. Parapharyngeal and
retropharyngeal spaces are within normal limits. Sequelae of right
modified radical neck dissection again noted. No cervical
lymphadenopathy.

Negative visualized brain parenchyma.

Aortic arch: Stable mild calcified arch atherosclerosis. There is
more pronounced great vessel origins soft and calcified plaque.

Right carotid system: No brachiocephalic or right CCA origin
stenosis despite mostly soft plaque. Circumferential soft plaque in
the right CCA. The right ICA is chronically occluded at its origin
with no reconstituted flow to the level of the proximal cavernous
ICA.

Left carotid system: No left CCA origin stenosis. Chronic soft and
calcified plaque at the left ICA origin and bulb appears stable
without stenosis. At the level of the distal bulb there is chronic
tortuosity and a web-like defect along the vessel resulting in
stenosis numerically estimated at 60 - 65 % with respect to the
distal vessel. See coronal series 603, image 114. This appears
stable since [PH] but may be progressed from [PH] restaging neck CT.
Distal to this level the cervical left ICA is widely patent. The
visible left ICA siphon is patent and unremarkable.

Vertebral arteries:

No significant proximal right subclavian artery stenosis despite
soft and calcified atherosclerosis. Calcified plaque at the right
vertebral artery origin resulting in moderate or possibly severe
stenosis which appears chronic (series 603, image 132). Dominant and
dolichoectatic right vertebral artery. Tortuous right V1 segment
with mild calcified plaque. No stenosis. No other right vertebral
artery stenosis to the vertebrobasilar junction. The right AICA
appears dominant and is patent. The visible basilar artery is
unremarkable.

No proximal left subclavian artery stenosis despite soft and
calcified atherosclerosis. The left vertebral artery is mildly
ectatic. The left vertebral Origin is stenotic due to soft plaque as
seen on series 603, image 134. Moderate to severe stenosis which
appears chronic. Tortuous left V1 segment. No other left vertebral
artery stenosis to the vertebrobasilar junction. Normal left PICA
origin.
IMPRESSION: 1. Chronic occlusion of the right ICA. Chronic soft and calcified
atherosclerotic plaque at the left ICA origin and bulb. Tortuosity
an web-like filling defect at the distal bulb resulting in stenosis
estimated at 60-65% (series 603, image 114). This appears stable
since the [PH] restaging neck CT, but might have progressed since
[DATE]. Ectatic bilateral vertebral arteries with moderate to severe
chronic stenosis at both vertebral origins.
3. Stable neck soft tissues status post modified radical right neck
dissection.

## 2016-06-19 MED ORDER — IOPAMIDOL (ISOVUE-370) INJECTION 76%
100.0000 mL | Freq: Once | INTRAVENOUS | Status: AC | PRN
  Administered 2016-06-19: 100 mL via INTRAVENOUS

## 2016-06-21 ENCOUNTER — Ambulatory Visit (INDEPENDENT_AMBULATORY_CARE_PROVIDER_SITE_OTHER): Payer: No Typology Code available for payment source | Admitting: Vascular Surgery

## 2016-06-21 ENCOUNTER — Encounter: Payer: Self-pay | Admitting: Vascular Surgery

## 2016-06-21 VITALS — BP 145/90 | HR 73 | Temp 97.8°F | Resp 16 | Ht 68.0 in | Wt 186.0 lb

## 2016-06-21 DIAGNOSIS — I739 Peripheral vascular disease, unspecified: Principal | ICD-10-CM

## 2016-06-21 DIAGNOSIS — I779 Disorder of arteries and arterioles, unspecified: Secondary | ICD-10-CM

## 2016-06-24 ENCOUNTER — Other Ambulatory Visit: Payer: Self-pay

## 2016-06-26 ENCOUNTER — Telehealth: Payer: Self-pay | Admitting: Vascular Surgery

## 2016-06-26 NOTE — Telephone Encounter (Signed)
I called Alexander Duran w/[facility] [employer] and left voice msg. [phone], ext [phone].  Pt has procedure with [facility] on 07/04/16.  She wants to know the exact name of the procedure codes [id number] (Angiogram Arch Aorta) and [phone] (Angiogram-Carotid artery) as well as which hospital the procedure will take place at.   I called Alexander Duran and relayed this information.  Ashleigh E., LPN.

## 2016-07-04 ENCOUNTER — Encounter (HOSPITAL_COMMUNITY)
Admission: RE | Disposition: A | Discharge status: Home / Self Care | Payer: Self-pay | Source: Ambulatory Visit | Attending: Vascular Surgery

## 2016-07-04 ENCOUNTER — Ambulatory Visit (HOSPITAL_COMMUNITY)
Admission: RE | Admit: 2016-07-04 | Discharge: 2016-07-04 | Disposition: A | Discharge status: Home / Self Care | Payer: No Typology Code available for payment source | Source: Ambulatory Visit | Attending: Vascular Surgery | Admitting: Vascular Surgery

## 2016-07-04 ENCOUNTER — Telehealth: Payer: Self-pay | Admitting: Vascular Surgery

## 2016-07-04 DIAGNOSIS — I6523 Occlusion and stenosis of bilateral carotid arteries: Secondary | ICD-10-CM | POA: Insufficient documentation

## 2016-07-04 DIAGNOSIS — I739 Peripheral vascular disease, unspecified: Secondary | ICD-10-CM

## 2016-07-04 DIAGNOSIS — I779 Disorder of arteries and arterioles, unspecified: Secondary | ICD-10-CM | POA: Diagnosis present

## 2016-07-04 DIAGNOSIS — Z85818 Personal history of malignant neoplasm of other sites of lip, oral cavity, and pharynx: Secondary | ICD-10-CM | POA: Diagnosis not present

## 2016-07-04 DIAGNOSIS — Z7982 Long term (current) use of aspirin: Secondary | ICD-10-CM | POA: Insufficient documentation

## 2016-07-04 HISTORY — PX: PERIPHERAL VASCULAR CATHETERIZATION: SHX172C

## 2016-07-04 LAB — POCT I-STAT, CHEM 8
BUN: 11 mg/dL (ref 6–20)
CREATININE: 0.8 mg/dL (ref 0.61–1.24)
Calcium, Ion: 1.21 mmol/L (ref 1.15–1.40)
Chloride: 105 mmol/L (ref 101–111)
GLUCOSE: 100 mg/dL — AB (ref 65–99)
HEMATOCRIT: 38 % — AB (ref 39.0–52.0)
Hemoglobin: 12.9 g/dL — ABNORMAL LOW (ref 13.0–17.0)
POTASSIUM: 4.1 mmol/L (ref 3.5–5.1)
Sodium: 140 mmol/L (ref 135–145)
TCO2: 28 mmol/L (ref 0–100)

## 2016-07-04 SURGERY — AORTIC ARCH ANGIOGRAPHY

## 2016-07-04 MED ORDER — IODIXANOL 320 MG/ML IV SOLN
INTRAVENOUS | Status: DC | PRN
  Administered 2016-07-04: 75 mL via INTRA_ARTERIAL

## 2016-07-04 MED ORDER — HEPARIN (PORCINE) IN NACL 2-0.9 UNIT/ML-% IJ SOLN
INTRAMUSCULAR | Status: DC | PRN
  Administered 2016-07-04: 1000 mL via INTRA_ARTERIAL

## 2016-07-04 MED ORDER — HEPARIN (PORCINE) IN NACL 2-0.9 UNIT/ML-% IJ SOLN
INTRAMUSCULAR | Status: AC
  Filled 2016-07-04: qty 1000

## 2016-07-04 MED ORDER — SODIUM CHLORIDE 0.9 % IV SOLN
INTRAVENOUS | Status: DC
  Administered 2016-07-04: 08:00:00 via INTRAVENOUS

## 2016-07-04 MED ORDER — LIDOCAINE HCL (PF) 1 % IJ SOLN
INTRAMUSCULAR | Status: DC | PRN
  Administered 2016-07-04: 14 mL

## 2016-07-04 MED ORDER — SODIUM CHLORIDE 0.9 % IV SOLN: 1.0000 mL/kg/h | INTRAVENOUS | Status: DC

## 2016-07-04 MED ORDER — ACETAMINOPHEN 325 MG PO TABS
650.0000 mg | ORAL_TABLET | ORAL | Status: DC | PRN
  Administered 2016-07-04: 650 mg via ORAL

## 2016-07-04 MED ORDER — LIDOCAINE HCL (PF) 1 % IJ SOLN
INTRAMUSCULAR | Status: AC
  Filled 2016-07-04: qty 30

## 2016-07-04 MED ORDER — ACETAMINOPHEN 325 MG PO TABS
ORAL_TABLET | ORAL | Status: AC
  Filled 2016-07-04: qty 2

## 2016-07-04 SURGICAL SUPPLY — 11 items
CATH ANGIO 5F BER2 100CM (CATHETERS) ×1 IMPLANT
CATH ANGIO 5F PIGTAIL 100CM (CATHETERS) ×1 IMPLANT
CATH HEADHUNTER H1 5F 100CM (CATHETERS) ×1 IMPLANT
COVER PRB 48X5XTLSCP FOLD TPE (BAG) IMPLANT
COVER PROBE 5X48 (BAG) ×2
KIT PV (KITS) ×2 IMPLANT
SHEATH PINNACLE 5F 10CM (SHEATH) ×1 IMPLANT
SYR MEDRAD MARK V 150ML (SYRINGE) ×2 IMPLANT
TRANSDUCER W/STOPCOCK (MISCELLANEOUS) ×2 IMPLANT
TRAY PV CATH (CUSTOM PROCEDURE TRAY) ×2 IMPLANT
WIRE BENTSON .035X145CM (WIRE) ×1 IMPLANT

## 2016-07-04 NOTE — H&P (View-Only) (Signed)
Established Carotid Patient  History of Present Illness  Alexander Duran is a 71 y.o. (1946-06-02) male who presents with chief complaint:  F/u neck CTA.  His current sx are: none.  Previous carotid studies demonstrated: RICA occluded stenosis, LICA [id number] by velocity without plaque morphology consistent with such.  Patient has no history of TIA or stroke symptom.  The patient has never had amaurosis fugax or monocular blindness.  The patient has never had facial drooping or hemiplegia.  The patient has never had receptive or expressive aphasia.    The patient's PMH, PSH, SH, and FamHx are unchanged from 05/17/16  Current Outpatient Prescriptions  Medication Sig Dispense Refill  . ALPRAZolam (XANAX) 0.5 MG tablet Take 1 tablet (0.5 mg total) by mouth at bedtime as needed for anxiety. (Patient not taking: Reported on 05/17/2016) 8 tablet 0  . amLODipine (NORVASC) 10 MG tablet Take 1 tablet (10 mg total) by mouth daily. 30 tablet 6  . aspirin 81 MG chewable tablet Chew 81 mg by mouth daily.    . butalbital-acetaminophen-caffeine (FIORICET, ESGIC) 50-325-40 MG tablet Take 1 tablet by mouth every 6 (six) hours as needed for headache. (Patient not taking: Reported on 05/17/2016) 30 tablet 0  . DULoxetine (CYMBALTA) 30 MG capsule Take 30 mg by mouth daily.    . hydrochlorothiazide (HYDRODIURIL) 25 MG tablet Take 1 tablet (25 mg total) by mouth daily. (Patient not taking: Reported on 05/17/2016) 30 tablet 0  . HYDROcodone-acetaminophen (NORCO/VICODIN) 5-325 MG tablet Take 1 tablet by mouth daily as needed for moderate pain.    Marland Kitchen levothyroxine (SYNTHROID, LEVOTHROID) 75 MCG tablet Take 75 mcg by mouth daily.    Marland Kitchen lisinopril (PRINIVIL,ZESTRIL) 5 MG tablet Take 1 tablet (5 mg total) by mouth daily. 30 tablet 1  . ondansetron (ZOFRAN) 8 MG tablet Take 1 tablet (8 mg total) by mouth every 8 (eight) hours as needed for nausea. (Patient not taking: Reported on 05/17/2016) 30 tablet 3   No current  facility-administered medications for this visit.    On ROS today: continued intermittent hand numbness, no CVA of TIA sx   Physical Examination  Vitals:   06/21/16 1404 06/21/16 1409  BP: (!) 146/89 (!) 145/90  Pulse: 73   Resp: 16   Temp: 97.8 F (36.6 C)   TempSrc: Oral   SpO2: 92%   Weight: 186 lb (84.4 kg)   Height: 5\' 8"  (1.727 m)     Body mass index is 28.28 kg/m.  General: A&O x 3, WDWN  Eyes: PERRLA, EOMI  Neck: Supple, no nuchal rigidity, no palpable LAD, obvious post-surgical defect in right neck, some mild contracture of the neck  Pulmonary: Sym exp, good air movt, CTAB, no rales, rhonchi, & wheezing  Cardiac: RRR, Nl S1, S2, no Murmurs, rubs or gallops  Vascular: Vessel Right Left  Radial Palpable Palpable  Brachial Palpable Palpable  Carotid Palpable, without bruit Palpable, without bruit  Aorta Not palpable N/A  Femoral Palpable Palpable  Popliteal Not palpable Not palpable  PT Faintly Palpable Faintly Palpable  DP Faintly Palpable Faintly Palpable   Gastrointestinal: soft, NTND, -G/R, - HSM, - masses, - CVAT B  Musculoskeletal: M/S 5/5 throughout , Extremities without ischemic changes   Neurologic: CN 2-12 intact , Pain and light touch intact in extremities , Motor exam as listed above  CTA Neck (06/19/16) 1. Chronic occlusion of the right ICA. Chronic soft and calcified atherosclerotic plaque at the left ICA origin and bulb. Tortuosity an web-like  filling defect at the distal bulb resulting in stenosis estimated at 60-65% (series 603, image 114). This appears stable since the 2017 restaging neck CT, but might have progressed since 2015. 2. 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 3. Stable neck soft tissues status post modified radical right neck dissection.  Based on my review of the CTA, this patient's L carotid artery is inadequately  imaged.  There appears to be 50-75% stenosis on some images, but the lesion is at an angulated segment of the LICA so I have concerns that reconstruction artifact may be leading to overestimation of the stenosis.   Medical Decision Making  Alexander Duran is a 71 y.o. 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  His R ICA occluded suddenly and unexpectedly.  A repeat on the left side might be devastating for this patient. I discussed with the patient the nature of angiographic procedures, especially the limited patencies of any endovascular intervention.   The patient is aware of that the risks of an angiographic procedure include but are not limited to: bleeding, infection, access site complications, renal failure, embolization, rupture of vessel, dissection, arteriovenous fistula, possible need for emergent surgical intervention, possible need for surgical procedures to treat the patient's pathology, anaphylactic reaction to contrast, and stroke and death.   The patient is aware of the stroke risk for this procedure. The patient is aware of the risks and agrees to proceed.  He will be scheduled for the 18 JAN 18.  I discussed in depth with the patient the nature of atherosclerosis, and emphasized the importance of maximal medical management including strict control of blood pressure, blood glucose, and lipid levels, antiplatelet agents, obtaining regular exercise, and cessation of smoking.    The patient is aware that without maximal medical  management the underlying atherosclerotic disease process will progress, limiting the benefit of any interventions.  The patient is currently not on statin as not medically indicated  The patient is currently on an anti-platelet: ASA.  Thank you for allowing [place] to participate in this patient's care.   Adele Barthel, MD, FACS [facility] [place] Office: [phone] Pager: [phone]

## 2016-07-04 NOTE — Telephone Encounter (Signed)
Spoke to pt, aware of appt, mailed letter   2/23 post op

## 2016-07-04 NOTE — Progress Notes (Signed)
Site area: right groin sheath pulled and pressure held by Alexander Duran Site Prior to Removal:  Level 0 Pressure Applied For:  20 minutes Manual:   yes Patient Status During Pull:  stable Post Pull Site:  Level  0 Post Pull Instructions Given:  yes Post Pull Pulses Present: yes Dressing Applied:  yes Bedrest begins @  1100 Comments:

## 2016-07-04 NOTE — Discharge Instructions (Signed)

## 2016-07-04 NOTE — Telephone Encounter (Signed)
-----   Message from Mena Goes, RN sent at 07/04/2016 11:15 AM EST ----- Regarding: schedule 4-6 weeks   ----- Message ----- From: Conrad Champlin, MD Sent: 07/04/2016  10:43 AM To: [address]  EARSEL BRADBURY [id number] [birth year]   PROCEDURE: 1.  Right common femoral artery cannulation under ultrasound guidance 2.  Placement of catheter in aorta 3.  Arch Aortogram 4.  Right common carotid artery selection 5.  Right carotid and cerebral angiogram 6.  Left carotid and cerebral angiogram  Follow-up: 4-6 weeks

## 2016-07-04 NOTE — Op Note (Addendum)
OPERATIVE NOTE   PROCEDURE: 1.  Right common femoral artery cannulation under ultrasound guidance 2.  Placement of catheter in aorta 3.  Arch Aortogram 4.  Right common carotid artery selection 5.  Right carotid and cerebral angiogram 6.  Left carotid and cerebral angiogram  PRE-OPERATIVE DIAGNOSIS: Left carotid artery disease, known right carotid artery occlusion  POST-OPERATIVE DIAGNOSIS: same as above   SURGEON: Adele Barthel, MD  ANESTHESIA: local anesthesia  ESTIMATED BLOOD LOSS: 50 cc  CONTRAST: 75 cc  FINDING(S):  Type I aortic arch  Innominate artery: patent  R common carotid artery: patent  R internal carotid artery: flush occlusion  R external carotid artery: patent  R subclavian artery: patent with some limited diseased  R vertebral artery: patent, hypertrophied  Left common carotid artery: patent  L internal carotid artery: hairpin with >70% stenosis at bend (possibly much higher)  L external carotid artery: patent  Left subclavian artery: patent  Left vertebral artery: patent  Intracranial findings to be read by Neuroradiology  SPECIMEN(S):  none  INDICATIONS:   Alexander Duran is a 71 y.o. male history of radial neck dissection for tonsillar cancer who presents with abnormal neck CTA.  The patient presents for: B carotid and cerebral angiography.  I discussed with the patient the nature of angiographic procedures, especially the limited patencies of any endovascular intervention.  The patient is aware of that the risks of an angiographic procedure include but are not limited to: bleeding, infection, access site complications, renal failure, embolization, rupture of vessel, dissection, possible need for emergent surgical intervention, possible need for surgical procedures to treat the patient's pathology, and stroke and death.  The patient is aware of the risks and agrees to proceed.  DESCRIPTION: After full informed consent was obtained from  the patient, the patient was brought back to the angiography suite.  The patient was placed supine upon the angiography table and connected to cardiopulmonary monitoring equipment.  A circulating radiologic technician maintained continuous monitoring of the patient's cardiopulmonary status.  Additionally, the control room radiologic technician provided backup monitoring throughout the procedure.  The patient was prepped and drape in the standard fashion for an angiographic procedure.  At this point, attention was turned to the right groin.  Under ultrasound guidance,  The subcutaneous tissue surrounding the right common femoral artery was anesthesized with 1% lidocaine with epinephrine.  The artery was then cannulated with a 18 gauge needle.  The Bentson wire was passed up into the aorta.  The needle was exchanged for a 5-Fr sheath, which was advanced over the wire into the common femoral artery.  The dilator was then removed.  The long pigtail catheter was then loaded over the wire up to the level of ascending aorta.  The catheter was connected to the power injector circuit.  After de-airring and de-clotting the circuit, a power injector arch aortogram was completed at 40 degree LAO.    The catheter was then pulled into the descending thoracic aorta.  The catheter was exchanged for a BER-2 catheter over the Bentson wire.  The left common carotid artery was selected with a combination of a BER-2 catheter and Bentson wire.   The catheter was advanced into the proximal left common carotid artery.  The wire was removed and then the catheter de-airred and declotted.  The catheter was connected to the power injector circuit.  A left cerebral and carotid angiogram was completed in anteroposterior and lateral projections.  The findings are listed above.  I then pulled the catheter and wire back into the aortic arch.  I selected the innominate artery and then the right common carotid artery using this combination.    The catheter would not advance past the innominate artery, kicking out each time.  I rotated the image intensifer to splay out the innominate artery and did a hand injection.  This clearly demonstrated the common carotid artery.  I exchanged the catheter for a H1 catheter.  Using this combination, I selected the innominate artery.  The right common carotid artery was then selected.  The catheter was advanced into the proximal right common carotid artery.  The wire was removed and then the catheter de-airred and declotted.  The catheter was connected to the power injector circuit.  A right cerebral and carotid angiogram was completed in anteroposterior and lateral projections.  The findings are listed above.    The Bentson wire was replaced in the catheter and the catheter and wire pulled back into the aortic arch.  The sheath was aspirated.  No clots were present and the sheath was reloaded with heparinized saline.    Based on the images, I suspect this patient will need intervention as the left carotid arterial system feeds both sides of the brain.  Additionally, I suspect with the steep hairpin loop, the compression of the artery likely results in dynamic compression.  Given his prior radiation history, will have him evaluated for a carotid stent placement.   COMPLICATIONS: none  CONDITION: stable   Adele Barthel, MD, [facility] [place] Office: [phone] Pager: [phone]  07/04/2016, 10:37 AM

## 2016-07-04 NOTE — Interval H&P Note (Signed)
History and Physical Interval Note:  07/04/2016 7:45 AM  Alexander Duran  has presented today for surgery, with the diagnosis of left carotid stenosis  The various methods of treatment have been discussed with the patient and family. After consideration of risks, benefits and other options for treatment, the patient has consented to  Procedure(s): Aortic Arch Angiography (N/A) Carotid & cerebral  Angiography (N/A) as a surgical intervention .  The patient's history has been reviewed, patient examined, no change in status, stable for surgery.  I have reviewed the patient's chart and labs.  Questions were answered to the patient's satisfaction.     Adele Barthel

## 2016-07-05 ENCOUNTER — Telehealth: Payer: Self-pay | Admitting: Vascular Surgery

## 2016-07-05 ENCOUNTER — Encounter (HOSPITAL_COMMUNITY): Payer: Self-pay | Admitting: Vascular Surgery

## 2016-07-05 NOTE — Telephone Encounter (Signed)
Sched appt 07/11/16 at 2:15. Spoke to pt to inform them of appt.

## 2016-07-05 NOTE — Telephone Encounter (Signed)
-----   Message from Mena Goes, RN sent at 07/05/2016  9:04 AM EST ----- Regarding: schedule   ----- Message ----- From: Conrad Breckenridge, MD Sent: 07/05/2016   7:51 AM To: [address]  HAYES STACHOWICZ [id number] [birth year]  Please set up for next available appointment for Dr. Oneida Alar for evaluation for L carotid stent

## 2016-07-11 ENCOUNTER — Ambulatory Visit (INDEPENDENT_AMBULATORY_CARE_PROVIDER_SITE_OTHER): Payer: No Typology Code available for payment source | Admitting: Vascular Surgery

## 2016-07-11 ENCOUNTER — Encounter: Payer: Self-pay | Admitting: Vascular Surgery

## 2016-07-11 VITALS — BP 130/73 | HR 80 | Temp 97.9°F | Resp 20 | Ht 68.0 in | Wt 188.2 lb

## 2016-07-11 DIAGNOSIS — I6523 Occlusion and stenosis of bilateral carotid arteries: Secondary | ICD-10-CM

## 2016-07-11 DIAGNOSIS — I6522 Occlusion and stenosis of left carotid artery: Secondary | ICD-10-CM

## 2016-07-11 MED ORDER — CLOPIDOGREL BISULFATE 75 MG PO TABS: 75.0000 mg | ORAL_TABLET | Freq: Every day | ORAL | 6 refills | Status: DC

## 2016-07-11 NOTE — Progress Notes (Signed)
Patient name: Alexander Duran MRN: [id number] DOB: [birth year] Sex: male  REASON FOR CONSULT: carotid stent  HPI: Alexander Duran is a 71 y.o. male referred for evaluation for carotid stenting. The patient has an asymptomatic greater than 80% left internal carotid artery stenosis. 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 He denies any prior stroke. He denies any shortness of breath or chest pain. Stenosis was confirmed on recent carotid angiogram. I reviewed the arteriogram images today. This shows a type I arch with slight curvature at the origin of the internal carotid artery greater than 80% stenosis contralateral (right side internal carotid artery occlusion. The patient is currently on aspirin and a statin. He is not on Plavix.  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 He still has some mild swallowing problems. He currently is cancer free.  He is a [place] patient and previously served in [employer].  Past Medical History:  Diagnosis Date  . Cancer ([place])    Tonsil cancer  . Carotid artery stenosis without cerebral infarction 08/30/2015  . Hypertension   . Hypothyroidism 05/09/2014  . Nausea in adult 08/10/2015  . Other headache syndrome 08/10/2015  . Thyroid disease   . Weight loss 08/08/2015   Past Surgical History:  Procedure Laterality Date  . BACK SURGERY    . PERIPHERAL VASCULAR CATHETERIZATION N/A 07/04/2016   Procedure: Aortic Arch Angiography;  Surgeon: Conrad Scenic, MD;  Location: [place] [facility];  Service: Cardiovascular;  Laterality: N/A;  . PERIPHERAL VASCULAR CATHETERIZATION N/A 07/04/2016   Procedure: Carotid & cerebral  Angiography;  Surgeon: Conrad Oppelo, MD;  Location: [facility];  Service: Cardiovascular;  Laterality: N/A;  . TUMOR REMOVAL     in neck    Family History  Problem Relation Age of Onset  . Cancer Father     lung cancer  .  COPD Mother   . Macular degeneration Mother     SOCIAL HISTORY: Social History   Social History  . Marital status: Married    Spouse name: N/A  . Number of children: N/A  . Years of education: N/A   Occupational History  . Not on file.   Social History Main Topics  . Smoking status: Former Smoker    Packs/day: 1.00    Years: 25.00    Quit date: 05/04/2001  . Smokeless tobacco: Never Used  . Alcohol use 1.2 oz/week    2 Shots of liquor per week     Comment: rarely  . Drug use: No  . Sexual activity: Not on file   Other Topics Concern  . Not on file   Social History Narrative  . No narrative on file    No Known Allergies  Current Outpatient Prescriptions  Medication Sig Dispense Refill  . aspirin 81 MG chewable tablet Chew 81 mg by mouth every evening.     . DULoxetine (CYMBALTA) 30 MG capsule Take 30 mg by mouth daily at 12 noon.     Marland Kitchen HYDROcodone-acetaminophen (NORCO/VICODIN) 5-325 MG tablet Take 0.5-1 tablets by mouth daily as needed for moderate pain.     Marland Kitchen levothyroxine (SYNTHROID, LEVOTHROID) 75 MCG tablet Take 75 mcg by mouth daily.    Marland Kitchen lisinopril (PRINIVIL,ZESTRIL) 5 MG tablet Take 1 tablet (5 mg total) by mouth daily. (Patient taking differently: Take 5 mg by mouth every evening. ) 30 tablet  1  . Polyethyl Glycol-Propyl Glycol (SYSTANE OP) Apply 1 drop to eye daily.    Marland Kitchen amLODipine (NORVASC) 10 MG tablet Take 1 tablet (10 mg total) by mouth daily. (Patient not taking: Reported on 07/11/2016) 30 tablet 6  . clopidogrel (PLAVIX) 75 MG tablet Take 1 tablet (75 mg total) by mouth daily. 30 tablet 6   No current facility-administered medications for this visit.     ROS:   General:  No weight loss, Fever, chills  HEENT: No recent headaches, no nasal bleeding, no visual changes, no sore throat  Neurologic: No dizziness, blackouts, seizures. No recent symptoms of stroke or mini- stroke. No recent episodes of slurred speech, or temporary blindness.  Cardiac:  No recent episodes of chest pain/pressure, no shortness of breath at rest.  No shortness of breath with exertion.  Denies history of atrial fibrillation or irregular heartbeat  Vascular: No history of rest pain in feet.  No history of claudication.  No history of non-healing ulcer, No history of DVT   Pulmonary: No home oxygen, no productive cough, no hemoptysis,  No asthma or wheezing  Musculoskeletal:  [ ]  Arthritis, [ ]  Low back pain,  [ ]  Joint pain  Hematologic:No history of hypercoagulable state.  No history of easy bleeding.  No history of anemia  Gastrointestinal: No hematochezia or melena,  No gastroesophageal reflux, no trouble swallowing  Urinary: [ ]  chronic Kidney disease, [ ]  on HD - [ ]  MWF or [ ]  TTHS, [ ]  Burning with urination, [ ]  Frequent urination, [ ]  Difficulty urinating;   Skin: No rashes  Psychological: No history of anxiety,  No history of depression   Physical Examination  Vitals:   07/11/16 1419 07/11/16 1420  BP: 133/81 130/73  Pulse: 80   Resp: 20   Temp: 97.9 F (36.6 C)   TempSrc: Oral   SpO2: 96%   Weight: 188 lb 3.2 oz (85.4 kg)   Height: 5\' 8"  (1.727 m)     Body mass index is 28.62 kg/m.  General:  Alert and oriented, no acute distress HEENT: Normal Neck: No bruit or JVD Pulmonary: Clear to auscultation bilaterally Cardiac: Regular Rate and Rhythm without murmur Abdomen: Soft, non-tender, non-distended Skin: No rash, thickened atrophied skin bilateral neck from prior radiation Extremity Pulses:  2+ radial, brachial, femoral pulses bilaterally Musculoskeletal: No deformity or edema  Neurologic: Upper and lower extremity motor 5/5 and symmetric, left lower lip droop which he states is secondary to prior dental procedure.  DATA:  Recent carotid angiogram images reviewed see history of present illness  ASSESSMENT:  Asymptomatic greater than 80% left internal carotid artery stenosis with prior neck irradiation making him high risk for  open carotid endarterectomy   PLAN:  Left carotid stent tentatively scheduled for 07/29/2016. We will start the patient on Plavix combined with his aspirin today. This will need to be continued postoperatively. Risks benefits possible palpitations and procedure details were explained the patient today including not limited to bleeding infection stroke risk 3-5%. He understands and agrees to proceed.   Ruta Hinds, MD [facility] [place] Office: [phone] Pager: [phone]

## 2016-07-12 ENCOUNTER — Ambulatory Visit: Payer: Non-veteran care | Admitting: Vascular Surgery

## 2016-07-15 ENCOUNTER — Other Ambulatory Visit: Payer: Self-pay

## 2016-07-17 ENCOUNTER — Ambulatory Visit (HOSPITAL_COMMUNITY)
Admission: RE | Admit: 2016-07-17 | Discharge: 2016-07-17 | Disposition: A | Discharge status: Home / Self Care | Payer: No Typology Code available for payment source | Source: Ambulatory Visit | Attending: Vascular Surgery | Admitting: Vascular Surgery

## 2016-07-17 ENCOUNTER — Ambulatory Visit (INDEPENDENT_AMBULATORY_CARE_PROVIDER_SITE_OTHER): Payer: No Typology Code available for payment source | Admitting: Vascular Surgery

## 2016-07-17 ENCOUNTER — Encounter: Payer: Self-pay | Admitting: Vascular Surgery

## 2016-07-17 VITALS — BP 121/75 | HR 64 | Temp 97.1°F | Resp 14 | Ht 68.0 in | Wt 184.0 lb

## 2016-07-17 DIAGNOSIS — H539 Unspecified visual disturbance: Secondary | ICD-10-CM

## 2016-07-17 DIAGNOSIS — I6523 Occlusion and stenosis of bilateral carotid arteries: Secondary | ICD-10-CM

## 2016-07-17 NOTE — Progress Notes (Signed)
Patient name: Alexander Duran    MRN: [id number]        DOB: 01-21-1946          Sex: male   REASON FOR CONSULT: carotid stent   HPI: Alexander Duran is a 71 y.o. male referred for evaluation for carotid stenting. The patient has an asymptomatic greater than 80% left internal carotid artery stenosis. 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 He denies any prior stroke. He denies any shortness of breath or chest pain. Stenosis was confirmed on recent carotid angiogram. I reviewed the arteriogram images today. This shows a type I arch with slight curvature at the origin of the internal carotid artery greater than 80% stenosis contralateral (right side internal carotid artery occlusion. The patient is currently on aspirin and a statin. He is not on Plavix.  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 He still has some mild swallowing problems. He currently is cancer free.  He presented to our office today complaining that over the last 3 days he has seen a lacy-type pattern over his right eye which follows the eye no matter which direction it tracks. He also had a symptom in the left thigh that he states looks like rain running down the windshield but also some lacy patchwork. He denies any visual field cuts. He denies any temporary blindness. He denies any focal stroke symptoms.   He is a [place] patient and previously served in [employer].       Past Medical History:  Diagnosis Date  . Cancer ([place])      Tonsil cancer  . Carotid artery stenosis without cerebral infarction 08/30/2015  . Hypertension    . Hypothyroidism 05/09/2014  . Nausea in adult 08/10/2015  . Other headache syndrome 08/10/2015  . Thyroid disease    . Weight loss 08/08/2015    Past Surgical History:  Procedure Laterality Date  . BACK SURGERY      . PERIPHERAL VASCULAR CATHETERIZATION N/A 07/04/2016   Procedure: Aortic Arch Angiography;  Surgeon: Conrad [place], MD;  Location: [facility];  Service: Cardiovascular;  Laterality: N/A;  . PERIPHERAL VASCULAR CATHETERIZATION N/A 07/04/2016    Procedure: Carotid & cerebral  Angiography;  Surgeon: Conrad [place], MD;  Location: [facility];  Service: Cardiovascular;  Laterality: N/A;  . TUMOR REMOVAL        in neck            Family History  Problem Relation Age of Onset  . Cancer Father        lung cancer  . COPD Mother    . Macular degeneration Mother        SOCIAL HISTORY: Social History         Social History  . Marital status: Married      Spouse name: N/A  . Number of children: N/A  . Years of education: N/A       Occupational History  . Not on file.          Social History Main Topics  . Smoking status: Former Smoker      Packs/day: 1.00      Years: 25.00      Quit date: 05/04/2001  . Smokeless tobacco: Never Used  . Alcohol use 1.2 oz/week      2 Shots of liquor  per week        Comment: rarely  . Drug use: No  . Sexual activity: Not on file        Other Topics Concern  . Not on file       Social History Narrative  . No narrative on file      No Known Allergies         Current Outpatient Prescriptions  Medication Sig Dispense Refill  . aspirin 81 MG chewable tablet Chew 81 mg by mouth every evening.       . DULoxetine (CYMBALTA) 30 MG capsule Take 30 mg by mouth daily at 12 noon.       Marland Kitchen HYDROcodone-acetaminophen (NORCO/VICODIN) 5-325 MG tablet Take 0.5-1 tablets by mouth daily as needed for moderate pain.       Marland Kitchen levothyroxine (SYNTHROID, LEVOTHROID) 75 MCG tablet Take 75 mcg by mouth daily.      Marland Kitchen lisinopril (PRINIVIL,ZESTRIL) 5 MG tablet Take 1 tablet (5 mg total) by mouth daily. (Patient taking differently: Take 5 mg by mouth every evening. ) 30 tablet 1  . Polyethyl Glycol-Propyl Glycol (SYSTANE OP) Apply 1 drop to eye daily.      Marland Kitchen amLODipine (NORVASC) 10 MG tablet Take 1 tablet (10 mg  total) by mouth daily. (Patient not taking: Reported on 07/11/2016) 30 tablet 6  . clopidogrel (PLAVIX) 75 MG tablet Take 1 tablet (75 mg total) by mouth daily. 30 tablet 6    No current facility-administered medications for this visit.       ROS:    General:  No weight loss, Fever, chills   HEENT: No recent headaches, no nasal bleeding, no visual changes, no sore throat   Neurologic: No dizziness, blackouts, seizures. No recent symptoms of stroke or mini- stroke. No recent episodes of slurred speech, or temporary blindness.   Cardiac: No recent episodes of chest pain/pressure, no shortness of breath at rest.  No shortness of breath with exertion.  Denies history of atrial fibrillation or irregular heartbeat   Vascular: No history of rest pain in feet.  No history of claudication.  No history of non-healing ulcer, No history of DVT    Pulmonary: No home oxygen, no productive cough, no hemoptysis,  No asthma or wheezing   Musculoskeletal:  [ ]  Arthritis, [ ]  Low back pain,  [ ]  Joint pain   Hematologic:No history of hypercoagulable state.  No history of easy bleeding.  No history of anemia   Gastrointestinal: No hematochezia or melena,  No gastroesophageal reflux, no trouble swallowing   Urinary: [ ]  chronic Kidney disease, [ ]  on HD - [ ]  MWF or [ ]  TTHS, [ ]  Burning with urination, [ ]  Frequent urination, [ ]  Difficulty urinating;    Skin: No rashes   Psychological: No history of anxiety,  No history of depression     Physical Examination    Vitals:   07/17/16 1519 07/17/16 1523 07/17/16 1524  BP: (!) 150/81 136/83 121/75  Pulse: 65 64 64  Resp: 14    Temp: 97.1 F (36.2 C)    SpO2: 98%    Weight: 184 lb (83.5 kg)    Height: 5\' 8"  (1.727 m)       General:  Alert and oriented, no acute distress HEENT: EOMI pupils symmetric Neurologic: Upper and lower extremity motor 5/5 and symmetric, left lower lip droop which he states is secondary to prior dental procedure.     DATA:  Recent carotid angiogram images  reviewed see history of present illness   ASSESSMENT:  Patient currently on Plavix and aspirin. He has symptoms suggestive of vitreous shift in consultation with one of our retinal specialist here in [place]. I do not believe that he is having an atheroembolic event. I spoke with one of the ophthalmologists at the [facility] and they are willing to see him through their triage clinic tomorrow morning.     PLAN:  Left carotid stent tentatively scheduled for 07/29/2016. He will continue his Plavix and aspirin for now unless they find some bleeding complication at his ophthalmology visit.. This will need to be continued postoperatively.     Ruta Hinds, MD [facility] Office: [phone] Pager: [phone]

## 2016-07-17 NOTE — Progress Notes (Signed)
Vitals:   07/17/16 1519 07/17/16 1523  BP: (!) 150/81 136/83  Pulse: 65 64  Resp: 14   Temp: 97.1 F (36.2 C)   SpO2: 98%   Weight: 184 lb (83.5 kg)   Height: 5\' 8"  (1.727 m)

## 2016-08-02 ENCOUNTER — Encounter (HOSPITAL_COMMUNITY): Payer: Self-pay | Admitting: Vascular Surgery

## 2016-08-02 ENCOUNTER — Inpatient Hospital Stay (HOSPITAL_COMMUNITY)
Admission: RE | Admit: 2016-08-02 | Discharge: 2016-08-04 | DRG: 035 | Disposition: A | Discharge status: Home / Self Care | Payer: Non-veteran care | Source: Ambulatory Visit | Attending: Vascular Surgery | Admitting: Vascular Surgery

## 2016-08-02 ENCOUNTER — Encounter (HOSPITAL_COMMUNITY)
Admission: RE | Disposition: A | Discharge status: Home / Self Care | Payer: Self-pay | Source: Ambulatory Visit | Attending: Vascular Surgery

## 2016-08-02 DIAGNOSIS — I6522 Occlusion and stenosis of left carotid artery: Secondary | ICD-10-CM | POA: Diagnosis present

## 2016-08-02 DIAGNOSIS — I10 Essential (primary) hypertension: Secondary | ICD-10-CM | POA: Diagnosis present

## 2016-08-02 DIAGNOSIS — Z9889 Other specified postprocedural states: Secondary | ICD-10-CM

## 2016-08-02 DIAGNOSIS — Z7902 Long term (current) use of antithrombotics/antiplatelets: Secondary | ICD-10-CM | POA: Diagnosis not present

## 2016-08-02 DIAGNOSIS — Z801 Family history of malignant neoplasm of trachea, bronchus and lung: Secondary | ICD-10-CM

## 2016-08-02 DIAGNOSIS — I6523 Occlusion and stenosis of bilateral carotid arteries: Secondary | ICD-10-CM | POA: Diagnosis present

## 2016-08-02 DIAGNOSIS — D62 Acute posthemorrhagic anemia: Secondary | ICD-10-CM | POA: Diagnosis not present

## 2016-08-02 DIAGNOSIS — Z87891 Personal history of nicotine dependence: Secondary | ICD-10-CM

## 2016-08-02 DIAGNOSIS — Z825 Family history of asthma and other chronic lower respiratory diseases: Secondary | ICD-10-CM

## 2016-08-02 DIAGNOSIS — Z7982 Long term (current) use of aspirin: Secondary | ICD-10-CM | POA: Diagnosis not present

## 2016-08-02 DIAGNOSIS — Z79899 Other long term (current) drug therapy: Secondary | ICD-10-CM | POA: Diagnosis not present

## 2016-08-02 DIAGNOSIS — Z85818 Personal history of malignant neoplasm of other sites of lip, oral cavity, and pharynx: Secondary | ICD-10-CM

## 2016-08-02 HISTORY — PX: CAROTID PTA/STENT INTERVENTION: CATH118231

## 2016-08-02 LAB — POCT I-STAT, CHEM 8
BUN: 18 mg/dL (ref 6–20)
CREATININE: 0.7 mg/dL (ref 0.61–1.24)
Calcium, Ion: 1.19 mmol/L (ref 1.15–1.40)
Chloride: 104 mmol/L (ref 101–111)
Glucose, Bld: 90 mg/dL (ref 65–99)
HEMATOCRIT: 40 % (ref 39.0–52.0)
HEMOGLOBIN: 13.6 g/dL (ref 13.0–17.0)
POTASSIUM: 4.2 mmol/L (ref 3.5–5.1)
Sodium: 140 mmol/L (ref 135–145)
TCO2: 28 mmol/L (ref 0–100)

## 2016-08-02 LAB — POCT ACTIVATED CLOTTING TIME
ACTIVATED CLOTTING TIME: 510 s
Activated Clotting Time: 158 seconds

## 2016-08-02 LAB — MRSA PCR SCREENING: MRSA by PCR: NEGATIVE

## 2016-08-02 SURGERY — CAROTID PTA/STENT INTERVENTION
Laterality: Left

## 2016-08-02 MED ORDER — LABETALOL HCL 5 MG/ML IV SOLN: 10.0000 mg | INTRAVENOUS | Status: DC | PRN

## 2016-08-02 MED ORDER — MORPHINE SULFATE (PF) 2 MG/ML IV SOLN
2.0000 mg | INTRAVENOUS | Status: DC | PRN
  Filled 2016-08-02: qty 1

## 2016-08-02 MED ORDER — ONDANSETRON HCL 4 MG/2ML IJ SOLN: 4.0000 mg | Freq: Four times a day (QID) | INTRAMUSCULAR | Status: DC | PRN

## 2016-08-02 MED ORDER — OXYCODONE HCL 5 MG PO TABS
ORAL_TABLET | ORAL | Status: AC
  Filled 2016-08-02: qty 1

## 2016-08-02 MED ORDER — ALUM & MAG HYDROXIDE-SIMETH 200-200-20 MG/5ML PO SUSP: 15.0000 mL | ORAL | Status: DC | PRN

## 2016-08-02 MED ORDER — LISINOPRIL 5 MG PO TABS
5.0000 mg | ORAL_TABLET | Freq: Every evening | ORAL | Status: DC
  Administered 2016-08-02: 5 mg via ORAL
  Filled 2016-08-02: qty 1

## 2016-08-02 MED ORDER — ATROPINE SULFATE 1 MG/10ML IJ SOSY
PREFILLED_SYRINGE | INTRAMUSCULAR | Status: DC | PRN
  Administered 2016-08-02: 0.5 mg via INTRAVENOUS

## 2016-08-02 MED ORDER — ASPIRIN 81 MG PO CHEW
CHEWABLE_TABLET | ORAL | Status: AC
  Administered 2016-08-02: 81 mg via ORAL
  Filled 2016-08-02: qty 1

## 2016-08-02 MED ORDER — MAGNESIUM SULFATE 2 GM/50ML IV SOLN: 2.0000 g | Freq: Every day | INTRAVENOUS | Status: DC | PRN

## 2016-08-02 MED ORDER — HEPARIN (PORCINE) IN NACL 2-0.9 UNIT/ML-% IJ SOLN
INTRAMUSCULAR | Status: DC | PRN
  Administered 2016-08-02: 1000 mL

## 2016-08-02 MED ORDER — ACETAMINOPHEN 325 MG PO TABS: 325.0000 mg | ORAL_TABLET | ORAL | Status: DC | PRN

## 2016-08-02 MED ORDER — GUAIFENESIN-DM 100-10 MG/5ML PO SYRP: 15.0000 mL | ORAL_SOLUTION | ORAL | Status: DC | PRN

## 2016-08-02 MED ORDER — ASPIRIN 81 MG PO CHEW
81.0000 mg | CHEWABLE_TABLET | Freq: Every evening | ORAL | Status: DC
  Administered 2016-08-03: 81 mg via ORAL
  Filled 2016-08-02: qty 1

## 2016-08-02 MED ORDER — HYDRALAZINE HCL 20 MG/ML IJ SOLN: 5.0000 mg | INTRAMUSCULAR | Status: DC | PRN

## 2016-08-02 MED ORDER — LEVOTHYROXINE SODIUM 75 MCG PO TABS
75.0000 ug | ORAL_TABLET | Freq: Every day | ORAL | Status: DC
  Administered 2016-08-03 – 2016-08-04 (×2): 75 ug via ORAL
  Filled 2016-08-02 (×2): qty 1

## 2016-08-02 MED ORDER — ASPIRIN 81 MG PO CHEW
81.0000 mg | CHEWABLE_TABLET | Freq: Once | ORAL | Status: AC
  Administered 2016-08-02: 81 mg via ORAL

## 2016-08-02 MED ORDER — BIVALIRUDIN 250 MG IV SOLR
INTRAVENOUS | Status: AC
  Filled 2016-08-02: qty 250

## 2016-08-02 MED ORDER — CLOPIDOGREL BISULFATE 75 MG PO TABS
75.0000 mg | ORAL_TABLET | Freq: Once | ORAL | Status: AC
  Administered 2016-08-02: 75 mg via ORAL

## 2016-08-02 MED ORDER — LIDOCAINE HCL (PF) 1 % IJ SOLN
INTRAMUSCULAR | Status: DC | PRN
  Administered 2016-08-02: 20 mL

## 2016-08-02 MED ORDER — PANTOPRAZOLE SODIUM 40 MG PO TBEC
40.0000 mg | DELAYED_RELEASE_TABLET | Freq: Every day | ORAL | Status: DC
  Administered 2016-08-02 – 2016-08-03 (×2): 40 mg via ORAL
  Filled 2016-08-02 (×2): qty 1

## 2016-08-02 MED ORDER — CLOPIDOGREL BISULFATE 75 MG PO TABS
ORAL_TABLET | ORAL | Status: AC
  Administered 2016-08-02: 75 mg via ORAL
  Filled 2016-08-02: qty 1

## 2016-08-02 MED ORDER — IODIXANOL 320 MG/ML IV SOLN
INTRAVENOUS | Status: DC | PRN
  Administered 2016-08-02: 80 mL via INTRA_ARTERIAL

## 2016-08-02 MED ORDER — DULOXETINE HCL 30 MG PO CPEP
30.0000 mg | ORAL_CAPSULE | Freq: Every day | ORAL | Status: DC
  Administered 2016-08-02 – 2016-08-03 (×2): 30 mg via ORAL
  Filled 2016-08-02 (×2): qty 1

## 2016-08-02 MED ORDER — POTASSIUM CHLORIDE CRYS ER 20 MEQ PO TBCR: 20.0000 meq | EXTENDED_RELEASE_TABLET | Freq: Every day | ORAL | Status: DC | PRN

## 2016-08-02 MED ORDER — SODIUM CHLORIDE 0.9 % IV SOLN
INTRAVENOUS | Status: DC
Start: 2016-08-02 — End: 2016-08-02
  Administered 2016-08-02: 500 mL via INTRAVENOUS
  Administered 2016-08-02: 06:00:00 via INTRAVENOUS

## 2016-08-02 MED ORDER — HEPARIN (PORCINE) IN NACL 2-0.9 UNIT/ML-% IJ SOLN
INTRAMUSCULAR | Status: AC
  Filled 2016-08-02: qty 1000

## 2016-08-02 MED ORDER — SODIUM CHLORIDE 0.45 % IV SOLN: INTRAVENOUS | Status: DC

## 2016-08-02 MED ORDER — LIDOCAINE HCL (PF) 1 % IJ SOLN
INTRAMUSCULAR | Status: AC
  Filled 2016-08-02: qty 30

## 2016-08-02 MED ORDER — METOPROLOL TARTRATE 5 MG/5ML IV SOLN: 2.0000 mg | INTRAVENOUS | Status: DC | PRN

## 2016-08-02 MED ORDER — SODIUM CHLORIDE 0.9 % IV SOLN
INTRAVENOUS | Status: DC | PRN
  Administered 2016-08-02 (×2): 1.75 mg/kg/h via INTRAVENOUS

## 2016-08-02 MED ORDER — HYDROCODONE-ACETAMINOPHEN 5-325 MG PO TABS
0.5000 | ORAL_TABLET | Freq: Every day | ORAL | Status: DC | PRN
  Administered 2016-08-03: 1 via ORAL
  Filled 2016-08-02: qty 1

## 2016-08-02 MED ORDER — NOREPINEPHRINE BITARTRATE 1 MG/ML IV SOLN
INTRAVENOUS | Status: AC
  Filled 2016-08-02: qty 4

## 2016-08-02 MED ORDER — PHENOL 1.4 % MT LIQD: 1.0000 | OROMUCOSAL | Status: DC | PRN

## 2016-08-02 MED ORDER — SODIUM CHLORIDE 0.45 % IV SOLN
INTRAVENOUS | Status: AC
  Administered 2016-08-02: 09:00:00 via INTRAVENOUS

## 2016-08-02 MED ORDER — OXYCODONE HCL 5 MG PO TABS
5.0000 mg | ORAL_TABLET | ORAL | Status: DC | PRN
Start: 2016-08-02 — End: 2016-08-04
  Administered 2016-08-02 (×2): 5 mg via ORAL
  Administered 2016-08-02 – 2016-08-03 (×3): 10 mg via ORAL
  Filled 2016-08-02 (×4): qty 2

## 2016-08-02 MED ORDER — BIVALIRUDIN BOLUS VIA INFUSION - CUPID
INTRAVENOUS | Status: DC | PRN
  Administered 2016-08-02: 63.6 mg via INTRAVENOUS

## 2016-08-02 MED ORDER — AMLODIPINE BESYLATE 10 MG PO TABS
10.0000 mg | ORAL_TABLET | Freq: Every day | ORAL | Status: DC
  Administered 2016-08-02 – 2016-08-04 (×2): 10 mg via ORAL
  Filled 2016-08-02 (×2): qty 1

## 2016-08-02 MED ORDER — CLOPIDOGREL BISULFATE 75 MG PO TABS
75.0000 mg | ORAL_TABLET | Freq: Every day | ORAL | Status: DC
  Administered 2016-08-03 – 2016-08-04 (×2): 75 mg via ORAL
  Filled 2016-08-02 (×2): qty 1

## 2016-08-02 MED ORDER — ACETAMINOPHEN 650 MG RE SUPP: 325.0000 mg | RECTAL | Status: DC | PRN

## 2016-08-02 MED ORDER — DOCUSATE SODIUM 100 MG PO CAPS
100.0000 mg | ORAL_CAPSULE | Freq: Every day | ORAL | Status: DC
  Administered 2016-08-03: 100 mg via ORAL
  Filled 2016-08-02: qty 1

## 2016-08-02 SURGICAL SUPPLY — 20 items
BALLN MOZEC 3.0X20 (BALLOONS) ×2
BALLN VIATRAC 5.5X20X135 (BALLOONS)
BALLN VIATRAC 5X20X135 (BALLOONS) ×2
BALLOON MOZEC 3.0X20 (BALLOONS) IMPLANT
BALLOON VIATRAC 5.5X20X135 (BALLOONS) IMPLANT
BALLOON VIATRAC 5X20X135 (BALLOONS) IMPLANT
CATH INFINITI VERT 5FR 125CM (CATHETERS) ×1 IMPLANT
COVER PRB 48X5XTLSCP FOLD TPE (BAG) IMPLANT
COVER PROBE 5X48 (BAG) ×2
DEVICE CONTINUOUS FLUSH (MISCELLANEOUS) ×1 IMPLANT
DEVICE EMBOSHIELD NAV6 4.0-7.0 (WIRE) ×1 IMPLANT
KIT ENCORE 26 ADVANTAGE (KITS) ×1 IMPLANT
KIT PV (KITS) ×2 IMPLANT
SHEATH PINNACLE 6F 10CM (SHEATH) ×1 IMPLANT
SHEATH SHUTTLE SELECT 6F (SHEATH) ×1 IMPLANT
STENT XACT CAR 9-7X30X136 (Permanent Stent) ×1 IMPLANT
SYR MEDRAD MARK V 150ML (SYRINGE) ×2 IMPLANT
TRANSDUCER W/STOPCOCK (MISCELLANEOUS) ×2 IMPLANT
TRAY PV CATH (CUSTOM PROCEDURE TRAY) ×2 IMPLANT
WIRE HI TORQ VERSACORE J 260CM (WIRE) ×1 IMPLANT

## 2016-08-02 NOTE — Progress Notes (Signed)
VASCULAR  Called to see pt bc/ of bleeding from right groin. Dressing had some bright red blood on it. Pressure held for about 15 minutes. No evidence of bleeding now. HR = 60. BP 105/60. Abdomen soft and non-tender. Dressing reapplied.   Deitra Mayo, MD, [place] [phone] Office: [phone]

## 2016-08-02 NOTE — Progress Notes (Signed)
Site area: rt groin Site Prior to Removal:  Level 1 Pressure Applied For: 25 minutes Manual:   yes Patient Status During Pull:  stable Post Pull Site:  Level 1 Post Pull Instructions Given:  yes Post Pull Pulses Present: palpable Dressing Applied:  Gauze and tegaderm Bedrest begins @ 1110 Comments: rt groin level 1 mostly resolved hematoma; area puffy and light bruising evolving.

## 2016-08-02 NOTE — Care Management Note (Signed)
Case Management Note  Patient Details  Name: Alexander Duran MRN: [id number] Date of Birth: December 15, 1945  Subjective/Objective:    s/p  left carotid angioplasty and stenting, NCM will cont to follow for dc needs.               Action/Plan:   Expected Discharge Date:                  Expected Discharge Plan:  Home/Self Care  In-House Referral:     Discharge planning Services  CM Consult  Post Acute Care Choice:    Choice offered to:     DME Arranged:    DME Agency:     HH Arranged:    HH Agency:     Status of Service:  In process, will continue to follow  If discussed at Long Length of Stay Meetings, dates discussed:    Additional Comments:  Zenon Mayo, RN 08/02/2016, 3:06 PM

## 2016-08-02 NOTE — Progress Notes (Signed)
Level 1 hematoma rt groin medial to sheath site extending laterally, approx 5 inches wide. Manual pressure held for 10 minutes w/hematoma softening. No oozing.  Area puffy, faint bruising lateral to sheath. Dr. Oneida Alar notified.

## 2016-08-02 NOTE — H&P (View-Only) (Signed)
Patient name: Alexander Duran    MRN: [id number]        DOB: [birth year]          Sex: male   REASON FOR CONSULT: carotid stent   HPI: Alexander Duran is a 71 y.o. male referred for evaluation for carotid stenting. The patient has an asymptomatic greater than 80% left internal carotid artery stenosis. 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 He denies any prior stroke. He denies any shortness of breath or chest pain. Stenosis was confirmed on recent carotid angiogram. I reviewed the arteriogram images today. This shows a type I arch with slight curvature at the origin of the internal carotid artery greater than 80% stenosis contralateral (right side internal carotid artery occlusion. The patient is currently on aspirin and a statin. He is not on Plavix.  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 He still has some mild swallowing problems. He currently is cancer free.  He presented to our office today complaining that over the last 3 days he has seen a lacy-type pattern over his right eye which follows the eye no matter which direction it tracks. He also had a symptom in the left thigh that he states looks like rain running down the windshield but also some lacy patchwork. He denies any visual field cuts. He denies any temporary blindness. He denies any focal stroke symptoms.   He is a [place] patient and previously served in [employer].       Past Medical History:  Diagnosis Date  . Cancer ([place])      Tonsil cancer  . Carotid artery stenosis without cerebral infarction 08/30/2015  . Hypertension    . Hypothyroidism 05/09/2014  . Nausea in adult 08/10/2015  . Other headache syndrome 08/10/2015  . Thyroid disease    . Weight loss 08/08/2015    Past Surgical History:  Procedure Laterality Date  . BACK SURGERY      . PERIPHERAL VASCULAR CATHETERIZATION N/A 07/04/2016   Procedure: Aortic Arch Angiography;  Surgeon: Conrad Ox[place]oxo [place], MD;  Location: [facility];  Service: Cardiovascular;  Laterality: N/A;  . PERIPHERAL VASCULAR CATHETERIZATION N/A 07/04/2016    Procedure: Carotid & cerebral  Angiography;  Surgeon: Conrad , MD;  Location: [facility];  Service: Cardiovascular;  Laterality: N/A;  . TUMOR REMOVAL        in neck            Family History  Problem Relation Age of Onset  . Cancer Father        lung cancer  . COPD Mother    . Macular degeneration Mother        SOCIAL HISTORY: Social History         Social History  . Marital status: Married      Spouse name: N/A  . Number of children: N/A  . Years of education: N/A       Occupational History  . Not on file.          Social History Main Topics  . Smoking status: Former Smoker      Packs/day: 1.00      Years: 25.00      Quit date: 05/04/2001  . Smokeless tobacco: Never Used  . Alcohol use 1.2 oz/week      2 Shots of liquor  per week        Comment: rarely  . Drug use: No  . Sexual activity: Not on file        Other Topics Concern  . Not on file       Social History Narrative  . No narrative on file      No Known Allergies         Current Outpatient Prescriptions  Medication Sig Dispense Refill  . aspirin 81 MG chewable tablet Chew 81 mg by mouth every evening.       . DULoxetine (CYMBALTA) 30 MG capsule Take 30 mg by mouth daily at 12 noon.       Marland Kitchen HYDROcodone-acetaminophen (NORCO/VICODIN) 5-325 MG tablet Take 0.5-1 tablets by mouth daily as needed for moderate pain.       Marland Kitchen levothyroxine (SYNTHROID, LEVOTHROID) 75 MCG tablet Take 75 mcg by mouth daily.      Marland Kitchen lisinopril (PRINIVIL,ZESTRIL) 5 MG tablet Take 1 tablet (5 mg total) by mouth daily. (Patient taking differently: Take 5 mg by mouth every evening. ) 30 tablet 1  . Polyethyl Glycol-Propyl Glycol (SYSTANE OP) Apply 1 drop to eye daily.      Marland Kitchen amLODipine (NORVASC) 10 MG tablet Take 1 tablet (10 mg  total) by mouth daily. (Patient not taking: Reported on 07/11/2016) 30 tablet 6  . clopidogrel (PLAVIX) 75 MG tablet Take 1 tablet (75 mg total) by mouth daily. 30 tablet 6    No current facility-administered medications for this visit.       ROS:    General:  No weight loss, Fever, chills   HEENT: No recent headaches, no nasal bleeding, no visual changes, no sore throat   Neurologic: No dizziness, blackouts, seizures. No recent symptoms of stroke or mini- stroke. No recent episodes of slurred speech, or temporary blindness.   Cardiac: No recent episodes of chest pain/pressure, no shortness of breath at rest.  No shortness of breath with exertion.  Denies history of atrial fibrillation or irregular heartbeat   Vascular: No history of rest pain in feet.  No history of claudication.  No history of non-healing ulcer, No history of DVT    Pulmonary: No home oxygen, no productive cough, no hemoptysis,  No asthma or wheezing   Musculoskeletal:  [ ]  Arthritis, [ ]  Low back pain,  [ ]  Joint pain   Hematologic:No history of hypercoagulable state.  No history of easy bleeding.  No history of anemia   Gastrointestinal: No hematochezia or melena,  No gastroesophageal reflux, no trouble swallowing   Urinary: [ ]  chronic Kidney disease, [ ]  on HD - [ ]  MWF or [ ]  TTHS, [ ]  Burning with urination, [ ]  Frequent urination, [ ]  Difficulty urinating;    Skin: No rashes   Psychological: No history of anxiety,  No history of depression     Physical Examination    Vitals:   07/17/16 1519 07/17/16 1523 07/17/16 1524  BP: (!) 150/81 136/83 121/75  Pulse: 65 64 64  Resp: 14    Temp: 97.1 F (36.2 C)    SpO2: 98%    Weight: 184 lb (83.5 kg)    Height: 5\' 8"  (1.727 m)       General:  Alert and oriented, no acute distress HEENT: EOMI pupils symmetric Neurologic: Upper and lower extremity motor 5/5 and symmetric, left lower lip droop which he states is secondary to prior dental procedure.     DATA:  Recent carotid angiogram images  reviewed see history of present illness   ASSESSMENT:  Patient currently on Plavix and aspirin. He has symptoms suggestive of vitreous shift in consultation with one of our retinal specialist here in [place]. I do not believe that he is having an atheroembolic event. I spoke with one of the ophthalmologists at the [facility] and they are willing to see him through their triage clinic tomorrow morning.     PLAN:  Left carotid stent tentatively scheduled for 07/29/2016. He will continue his Plavix and aspirin for now unless they find some bleeding complication at his ophthalmology visit.. This will need to be continued postoperatively.     Ruta Hinds, MD [facility] Office: [phone] Pager: [phone]

## 2016-08-02 NOTE — Progress Notes (Signed)
Patient called RN stating that R groin was bleeding. Rn went to bedside and held pressure for approximately 15 minutes and paged on call doctor, Scot Dock, [place]. Md held pressure and applied new dressing. Pt's BP 104/62 (75) with a HR of 56 and asypmtomatic.

## 2016-08-02 NOTE — Interval H&P Note (Signed)
History and Physical Interval Note:  08/02/2016 7:30 AM  Alexander Duran  has presented today for surgery, with the diagnosis of carotid stenosis  The various methods of treatment have been discussed with the patient and family. After consideration of risks, benefits and other options for treatment, the patient has consented to  Procedure(s): Carotid PTA/Stent Intervention (N/A) as a surgical intervention .  The patient's history has been reviewed, patient examined, no change in status, stable for surgery.  I have reviewed the patient's chart and labs.  Questions were answered to the patient's satisfaction.     Ruta Hinds

## 2016-08-02 NOTE — Op Note (Addendum)
Procedure: Left carotid angiogram, left carotid angioplasty and stenting   Preoperative diagnosis: 90% High-grade asymptomatic left internal carotid artery stenosis  Postoperative diagnosis: Same   Anesthesia: Local  Co-surgeon: Quay Burow M.D.   Operative findings: Right femoral sheath.  Right ICA stenosis treated 90% to 0% residual Abbott stent 7 x 9 x 30  Operative details: After obtaining informed consent, the patient was taken to the [facility] lab. The patient was placed in supine position the Angio table. Both groins were prepped and draped in usual sterile fashion. Local anesthesia was infiltrated over the right common femoral artery. Ultrasound was used to locate the right common femoral artery.  An introducer needle was placed into the right common femoral artery and there was good backbleeding from this. A 0.035 Versacore wire was then threaded up the abdominal aorta under fluoroscopic guidance. A 6 Fr shuttle sheath was placed over the guidewire into the right common femoral artery.  This was thoroughly flushed with heparinized saline.  The guidewire and sheath were advanced up into the aortic arch.  A 5 Fr vertebral catheter was placed over the wire and the wire advanced into the left common carotid artery and this was confirmed by contrast angiogram.  There is a 90% stenosis of the left internal carotid with a 45 degree bend right at the level of the lesion.  The external carotid is patent.  7 x 9 x 30 Abbott xact stent was selected.  An 50 versacore wire was then placed throught the vertebral catheter and advanced to just below the carotid bifurcation.  The 6 French Cook shuttle sheath was advanced over the catheter/guidewire up into the left common carotid artery.   Angiomax bolus was given.  ACT was also checked and was confirmed > 200. At this point an Abbott 7 mm filter was brought on the operative field and this was advanced up to the level of the carotid stenosis.  With some manipulation  the guidewire advanced through the stenosis with minimal difficulty. The filter was then advanced into the distal internal carotid artery at the level of the skull base. The filter was then deployed in this location. At this point a repeat contrast angiogram was performed to again determined the level of the stenosis. A 3 x 2 angioplasty balloon was brought in operative field and advanced to the level of stenosis and quickly inflated and deflated to 6 atmospheres. The predilatation balloon was removed. A 7 x 9 x 30 Abbott xact stent was then brought in the operative field and advanced to the level of stenosis and across centering on the most narrowed segment.  The stent was then deployed . A postdilatation was then performed after the stent delivery device was removed. This was done with a 5 x 2 balloon inflated to 10 atmospheres up and down.  There was brief bradycardia which quickly resolved with 1 mg atropine. A completion arteriogram was performed which showed that the residual stenosis was essentially 0 and the stent had good wall apposition.  The filter was retrieved Completion intracranial views were also performed in AP and lateral projection to be interpreted later by the neuroradiologist.  There was no obvious embolic event. The patient tolerated procedure well and there were no complications. After the completion arteriogram had been performed, the sheath was pulled back over a guidewire and these were then pulled down to the guidewire and the sheath exchanged for a 6 French short sheath in the right femoral artery. This was thoroughly flushed  with heparinized saline and was left in place to be pulled in the holding area after the ACT is less than 175.The patient tolerated the procedure well and there were no neurologic complications. The patient was transported to the holding area in stable condition.   Ruta Hinds, MD  [facility]  Office: [phone]  Pager:  [phone]

## 2016-08-03 ENCOUNTER — Encounter (HOSPITAL_COMMUNITY): Payer: Self-pay | Admitting: *Deleted

## 2016-08-03 LAB — CBC
HEMATOCRIT: 33.3 % — AB (ref 39.0–52.0)
HEMOGLOBIN: 11.1 g/dL — AB (ref 13.0–17.0)
MCH: 31.4 pg (ref 26.0–34.0)
MCHC: 33.3 g/dL (ref 30.0–36.0)
MCV: 94.3 fL (ref 78.0–100.0)
Platelets: 218 10*3/uL (ref 150–400)
RBC: 3.53 MIL/uL — AB (ref 4.22–5.81)
RDW: 13.5 % (ref 11.5–15.5)
WBC: 7.1 10*3/uL (ref 4.0–10.5)

## 2016-08-03 LAB — BASIC METABOLIC PANEL
ANION GAP: 5 (ref 5–15)
BUN: 10 mg/dL (ref 6–20)
CALCIUM: 8.8 mg/dL — AB (ref 8.9–10.3)
CO2: 27 mmol/L (ref 22–32)
Chloride: 103 mmol/L (ref 101–111)
Creatinine, Ser: 0.73 mg/dL (ref 0.61–1.24)
GLUCOSE: 104 mg/dL — AB (ref 65–99)
POTASSIUM: 4.5 mmol/L (ref 3.5–5.1)
Sodium: 135 mmol/L (ref 135–145)

## 2016-08-03 MED ORDER — SODIUM CHLORIDE 0.9 % IV SOLN
INTRAVENOUS | Status: DC
  Administered 2016-08-03: 100 mL/h via INTRAVENOUS

## 2016-08-03 NOTE — Discharge Summary (Signed)
Discharge Summary    Alexander Duran [birth year] 71 y.o. male  [id number]  Admission Date: 08/02/2016  Discharge Date: 08/04/16  Physician: Elam Dutch, MD  Admission Diagnosis: carotid stenosis   HPI:   This is a 71 y.o. male referred for evaluation for carotid stenting. The patient has an asymptomatic greater than 80% left internal carotid artery stenosis. 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 He denies any prior stroke. He denies any shortness of breath or chest pain. Stenosis was confirmed on recent carotid angiogram. I reviewed the arteriogram images today. This shows a type I arch with slight curvature at the origin of the internal carotid artery greater than 80% stenosis contralateral (right side internal carotid artery occlusion. The patient is currently on aspirin and a statin. He is not on Plavix. 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 He still has some mild swallowing problems. He currently is cancer free.  He presented to our office today complaining that over the last 3 days he has seen a lacy-type pattern over his right eye which follows the eye no matter which direction it tracks. He also had a symptom in the left thigh that he states looks like rain running down the windshield but also some lacy patchwork. He denies any visual field cuts. He denies any temporary blindness. He denies any focal stroke symptoms.  He is a [place] patient and previously served in [employer].  Hospital Course:  The patient was admitted to the hospital and taken to the operating room on 08/02/2016 and underwent: Left carotid angiogram, left carotid angioplasty and stenting     The pt tolerated the procedure well and was transported to the PACU in good condition.   Later that evening, he did have some bleeding from his right groin.  Dr. Scot Dock was called and pressure was  held for 15 minutes.  There was no evidence of bleeding after that.  He was hemodynamically stable.    On POD 1, he was doing well with no further bleeding.  His right groin is soft without hematoma.  He does have acute surgical blood loss anemia and when he got up out of bed to the chair, he became orthostatic.  His systolic BP was mid [id number].  IVF were restarted and after about 5 minutes his systolic BP was [id number].    He has an easily palpable right DP pulse.  By POD 2, he was feeling much better.  He has ambulated in the halls.  His back pain is much improved.  His right groin is soft without hematoma.  The remainder of the hospital course consisted of increasing mobilization and increasing intake of solids without difficulty.  CBC    Component Value Date/Time   WBC 7.1 08/03/2016 0306   RBC 3.53 (L) 08/03/2016 0306   HGB 11.1 (L) 08/03/2016 0306   HGB 15.3 08/10/2015 1449   HCT 33.3 (L) 08/03/2016 0306   HCT 44.5 08/10/2015 1449   PLT 218 08/03/2016 0306   PLT 209 08/10/2015 1449   MCV 94.3 08/03/2016 0306   MCV 91.8 08/10/2015 1449   MCH 31.4 08/03/2016 0306   MCHC 33.3 08/03/2016 0306   RDW 13.5 08/03/2016 0306   RDW 12.8 08/10/2015 1449   LYMPHSABS 0.7 (L) 08/10/2015 1449   MONOABS 0.7 08/10/2015 1449   EOSABS 0.0 08/10/2015 1449   BASOSABS 0.0 08/10/2015 1449  BMET    Component Value Date/Time   NA 135 08/03/2016 0306   NA 125 (L) 08/10/2015 1449   K 4.5 08/03/2016 0306   K 3.3 (L) 08/10/2015 1449   CL 103 08/03/2016 0306   CO2 27 08/03/2016 0306   CO2 28 08/10/2015 1449   GLUCOSE 104 (H) 08/03/2016 0306   GLUCOSE 103 08/10/2015 1449   BUN 10 08/03/2016 0306   BUN 13.0 08/10/2015 1449   CREATININE 0.73 08/03/2016 0306   CREATININE 0.8 08/10/2015 1449   CALCIUM 8.8 (L) 08/03/2016 0306   CALCIUM 9.0 08/10/2015 1449   GFRNONAA >60 08/03/2016 0306   GFRAA >60 08/03/2016 0306      Discharge Instructions    CAROTID [place]: Call MD for difficulty swallowing or  speaking; weakness in arms or legs that is a new symtom; severe headache.  If you have increased swelling in the neck and/or  are having difficulty breathing, CALL 911    Complete by:  As directed    Call MD for:  redness, tenderness, or signs of infection (pain, swelling, bleeding, redness, odor or green/yellow discharge around incision site)    Complete by:  As directed    Call MD for:  severe or increased pain, loss or decreased feeling  in affected limb(s)    Complete by:  As directed    Call MD for:  temperature >100.5    Complete by:  As directed    Discharge wound care:    Complete by:  As directed    Shower daily with soap and water starting 08/03/16   Driving Restrictions    Complete by:  As directed    No driving for 48 hours and while taking pain medication   Lifting restrictions    Complete by:  As directed    No heavy lifting for 3 weeks   Resume previous diet    Complete by:  As directed       Discharge Diagnosis:  carotid stenosis  Secondary Diagnosis: Patient Active Problem List   Diagnosis Date Noted  . Left carotid stenosis 08/02/2016  . Carotid disease, bilateral ([place]) 09/14/2015  . Carotid artery stenosis without cerebral infarction 08/30/2015  . Other headache syndrome 08/10/2015  . Nausea in adult 08/10/2015  . Weight loss 08/08/2015  . Hypothyroidism 05/09/2014  . Essential hypertension 05/09/2014  . Tonsil cancer ([place]) 05/04/2013   Past Medical History:  Diagnosis Date  . Cancer ([place])    Tonsil cancer  . Carotid artery stenosis without cerebral infarction 08/30/2015  . Hypertension   . Hypothyroidism 05/09/2014  . Nausea in adult 08/10/2015  . Other headache syndrome 08/10/2015  . Thyroid disease   . Weight loss 08/08/2015     Allergies as of 08/03/2016   No Known Allergies     Medication List    TAKE these medications   amLODipine 10 MG tablet Commonly known as:  NORVASC Take 1 tablet (10 mg total) by mouth daily.   aspirin 81 MG  chewable tablet Chew 81 mg by mouth every evening.   clopidogrel 75 MG tablet Commonly known as:  PLAVIX Take 1 tablet (75 mg total) by mouth daily.   DULoxetine 30 MG capsule Commonly known as:  CYMBALTA Take 30 mg by mouth daily at 12 noon.   HYDROcodone-acetaminophen 5-325 MG tablet Commonly known as:  NORCO/VICODIN Take 0.5-1 tablets by mouth daily as needed for moderate pain.   levothyroxine 75 MCG tablet Commonly known as:  SYNTHROID, LEVOTHROID Take 75 mcg  by mouth daily before breakfast.   lisinopril 5 MG tablet Commonly known as:  ZESTRIL Take 1 tablet (5 mg total) by mouth daily. What changed:  when to take this   SYSTANE OP Place 1 drop into both eyes daily.       Prescriptions given: None given  Instructions: 1.  No driving for 48 hours and while taking pain medication 2.  No heavy lifting x 3 weeks 3.  Shower daily starting 08/03/16  Disposition: home  Patient's condition: is Good  Follow up: 1. Dr. Oneida Alar in 4 weeks with carotid duplex   Leontine Locket, PA-C [facility] [phone] 08/03/2016  8:00 AM

## 2016-08-03 NOTE — Progress Notes (Signed)
Spoke with Mehama PA and verified that pt could get out of bed, transferred pt from bed to chair. When pt sat down in chair he stated he felt like he could pass out. Pts blood pressure dropped to 74/51. Samantha PA was on the floor and verbal order given to start NS at 1103ml/hr. Pt's blood pressure has continued to increase and he states he is feeling better and he does not feel he needs to pass out anymore. Will keep pt in chair at this time. Consuelo Pandy RN

## 2016-08-03 NOTE — Progress Notes (Signed)
Pt able to ambulate to bathroom with walker and 2 person assist, pt states he does not feel dizzy or light headed. Consuelo Pandy RN

## 2016-08-03 NOTE — Progress Notes (Addendum)
Progress Note  08/03/2016 7:48 AM 1 Day Post-Op  Subjective:  Pt upset b/c his back hurts from laying in bed & did not get to eat dinner last night.  Has not been up since before his procedure.   afebrile HR [id number] NSR [id number] systolic yesterday afternoon, since then XX123456 systolic 0000000 RA  Vitals:   08/02/16 2339 08/03/16 0333  BP: 100/64 104/61  Pulse: 62 62  Resp: 13 13  Temp: 98.1 F (36.7 C) 97.7 F (36.5 C)     Physical Exam: Neuro:  In tact Lungs:  Non labored Incision:  Right groin is soft with minimal ecchymosis. Extremities:  Easily palpable right DP pulse  CBC    Component Value Date/Time   WBC 7.1 08/03/2016 0306   RBC 3.53 (L) 08/03/2016 0306   HGB 11.1 (L) 08/03/2016 0306   HGB 15.3 08/10/2015 1449   HCT 33.3 (L) 08/03/2016 0306   HCT 44.5 08/10/2015 1449   PLT 218 08/03/2016 0306   PLT 209 08/10/2015 1449   MCV 94.3 08/03/2016 0306   MCV 91.8 08/10/2015 1449   MCH 31.4 08/03/2016 0306   MCHC 33.3 08/03/2016 0306   RDW 13.5 08/03/2016 0306   RDW 12.8 08/10/2015 1449   LYMPHSABS 0.7 (L) 08/10/2015 1449   MONOABS 0.7 08/10/2015 1449   EOSABS 0.0 08/10/2015 1449   BASOSABS 0.0 08/10/2015 1449    BMET    Component Value Date/Time   NA 135 08/03/2016 0306   NA 125 (L) 08/10/2015 1449   K 4.5 08/03/2016 0306   K 3.3 (L) 08/10/2015 1449   CL 103 08/03/2016 0306   CO2 27 08/03/2016 0306   CO2 28 08/10/2015 1449   GLUCOSE 104 (H) 08/03/2016 0306   GLUCOSE 103 08/10/2015 1449   BUN 10 08/03/2016 0306   BUN 13.0 08/10/2015 1449   CREATININE 0.73 08/03/2016 0306   CREATININE 0.8 08/10/2015 1449   CALCIUM 8.8 (L) 08/03/2016 0306   CALCIUM 9.0 08/10/2015 1449   GFRNONAA >60 08/03/2016 0306   GFRAA >60 08/03/2016 0306     Intake/Output Summary (Last 24 hours) at 08/03/16 0748 Last data filed at 08/03/16 0334  Gross per 24 hour  Intake                0 ml  Output             1375 ml  Net            -1375 ml     Assessment/Plan:   This is a 71 y.o. male who is s/p Left carotid angiogram, left carotid angioplasty and stenting  1 Day Post-Op  -pt is doing well this am. -pt neuro exam is in tact -pt has not ambulated.  Has not been out of bed since pressure held for bleeding yesterday.  Will get up out of bed to chair for breakfast. -should be able to discharge home today -f/u with Dr. Oneida Alar in 4 weeks with carotid duplex   Leontine Locket, PA-C [facility] [phone]  ADDENDUM: Asked to come back to pt's room bc when they got the pt up to the chair, he said he felt like he was going to faint and eyes rolled back in head and had a bit of a tremor.  When I got to the room, pt was alert and answering questions appropriately.  He says he felt like that yesterday when they were injecting local and one previous time when he got stitches.  His IVF were restarted at 100cc/hr.  Blood pressure when rechecked in chair was 75 systolic.  After 5 minutes, his BP was 90 systolic.  He states he is feeling much better.  Back pain is gone.  Will continue fluids at this time.  HIs Hgb is down 2.5gm from from pre-op.  May need a unit of blood.  Right groin continues to be soft without hematoma.  Will continue to monitor at this time and will d/w Dr. Scot Dock.  Leontine Locket, [place]. 08/03/2016 8:23 AM  I have interviewed the patient and examined the patient. I agree with the findings by the PA. No further bleeding from the right groin. No abdominal pain. He was able to ambulate later this morning without any difficulties. Agree with plans for discharge today.  Gae Gallop, MD [phone]

## 2016-08-04 NOTE — Progress Notes (Addendum)
  Progress Note    08/04/2016 7:19 AM 2 Days Post-Op  Subjective:  Feels much better  Afebrile HR  50's-70's NSR [id number] systolic 0000000 RA  Vitals:   08/03/16 2334 08/04/16 0322  BP: (!) 102/57 (!) 99/54  Pulse: 64 60  Resp: 11 11  Temp: 98.2 F (36.8 C) 97.7 F (36.5 C)    Physical Exam: Cardiac:  regular Lungs:  Non labored Incisions:   Right groin is soft without hematoma; mild ecchymosis   CBC    Component Value Date/Time   WBC 7.1 08/03/2016 0306   RBC 3.53 (L) 08/03/2016 0306   HGB 11.1 (L) 08/03/2016 0306   HGB 15.3 08/10/2015 1449   HCT 33.3 (L) 08/03/2016 0306   HCT 44.5 08/10/2015 1449   PLT 218 08/03/2016 0306   PLT 209 08/10/2015 1449   MCV 94.3 08/03/2016 0306   MCV 91.8 08/10/2015 1449   MCH 31.4 08/03/2016 0306   MCHC 33.3 08/03/2016 0306   RDW 13.5 08/03/2016 0306   RDW 12.8 08/10/2015 1449   LYMPHSABS 0.7 (L) 08/10/2015 1449   MONOABS 0.7 08/10/2015 1449   EOSABS 0.0 08/10/2015 1449   BASOSABS 0.0 08/10/2015 1449    BMET    Component Value Date/Time   NA 135 08/03/2016 0306   NA 125 (L) 08/10/2015 1449   K 4.5 08/03/2016 0306   K 3.3 (L) 08/10/2015 1449   CL 103 08/03/2016 0306   CO2 27 08/03/2016 0306   CO2 28 08/10/2015 1449   GLUCOSE 104 (H) 08/03/2016 0306   GLUCOSE 103 08/10/2015 1449   BUN 10 08/03/2016 0306   BUN 13.0 08/10/2015 1449   CREATININE 0.73 08/03/2016 0306   CREATININE 0.8 08/10/2015 1449   CALCIUM 8.8 (L) 08/03/2016 0306   CALCIUM 9.0 08/10/2015 1449   GFRNONAA >60 08/03/2016 0306   GFRAA >60 08/03/2016 0306    INR No results found for: INR   Intake/Output Summary (Last 24 hours) at 08/04/16 0719 Last data filed at 08/04/16 0600  Gross per 24 hour  Intake          3248.33 ml  Output             2750 ml  Net           498.33 ml     Assessment:  71 y.o. male is s/p:  Leftcarotid angiogram, left carotid angioplasty and stenting   2 Days Post-Op  Plan: -pt doing much better this  morning -has ambulated in the halls & voiding well.  -right groin is soft without hematoma -back pain is much better -dc home today and follow up with Dr. Oneida Alar in 4 weeks.   Leontine Locket, PA-C [facility] [phone] 08/04/2016 7:19 AM  I have interviewed the patient and examined the patient. I agree with the findings by the PA. Right groin with some ecchymosis but otherwise looks fine. Neuro intact. Home today.  Gae Gallop, MD [phone]

## 2016-08-04 NOTE — Progress Notes (Signed)
Reviewed discharge paperwork with pt including follow up appts, medications and s/s of infection. Also reviewed s/s of stroke, pt able to teach back s/s and when to call 911. Pt being picked up by best friend and taken out by wheelchair. Consuelo Pandy RN

## 2016-08-05 ENCOUNTER — Telehealth: Payer: Self-pay | Admitting: Vascular Surgery

## 2016-08-05 MED FILL — Norepinephrine Bitartrate IV Soln 1 MG/ML (Base Equivalent): INTRAVENOUS | Qty: 4 | Status: AC

## 2016-08-05 NOTE — Telephone Encounter (Signed)
Scheduled carotid on 3/5 and post op with Dr. Oneida Alar on 3/14.

## 2016-08-05 NOTE — Telephone Encounter (Signed)
-----   Message from Mena Goes, RN sent at 08/03/2016 12:41 PM EST ----- Regarding: 4 weeks w/ carotid duplex   ----- Message ----- From: Gabriel Earing, PA-C Sent: 08/03/2016   7:58 AM To: [facility] Charge Pool  S/p left carotid stenting 08/02/16.  F/u with Dr. Oneida Alar in 4 weeks with carotid duplex.  Thanks, Aldona Bar

## 2016-08-08 NOTE — Addendum Note (Signed)
Addended by: Lianne Cure A on: 08/08/2016 04:49 PM   Modules accepted: Orders

## 2016-08-09 ENCOUNTER — Encounter: Payer: Non-veteran care | Admitting: Vascular Surgery

## 2016-08-09 ENCOUNTER — Ambulatory Visit (HOSPITAL_COMMUNITY)
Admission: RE | Admit: 2016-08-09 | Discharge: 2016-08-09 | Disposition: A | Discharge status: Home / Self Care | Payer: Non-veteran care | Source: Ambulatory Visit | Attending: Vascular Surgery | Admitting: Vascular Surgery

## 2016-08-09 DIAGNOSIS — I6523 Occlusion and stenosis of bilateral carotid arteries: Secondary | ICD-10-CM | POA: Diagnosis present

## 2016-08-09 LAB — VAS US CAROTID
LCCADDIAS: 27 cm/s
LCCAPDIAS: 33 cm/s
LCCAPSYS: 133 cm/s
LEFT ECA DIAS: 9 cm/s
LICAPSYS: 107 cm/s
Left CCA dist sys: 120 cm/s
Left ICA dist dias: -73 cm/s
Left ICA dist sys: -218 cm/s
Left ICA prox dias: 28 cm/s

## 2016-08-14 ENCOUNTER — Encounter: Payer: Self-pay | Admitting: Vascular Surgery

## 2016-08-14 ENCOUNTER — Telehealth: Payer: Self-pay

## 2016-08-14 DIAGNOSIS — R1909 Other intra-abdominal and pelvic swelling, mass and lump: Secondary | ICD-10-CM

## 2016-08-14 NOTE — Consult Note (Signed)
NAME:  Alexander Duran, Alexander Duran               ACCOUNT NO.:  [id number]  MEDICAL RECORD NO.:  [id number]  LOCATION:                                 FACILITY:  PHYSICIAN:  Trivia Heffelfinger K. Laurens Matheny, M.D.DATE OF BIRTH:  1946-02-01  DATE OF CONSULTATION: DATE OF DISCHARGE:                                CONSULTATION   CLINICAL HISTORY:  Left carotid artery stenosis.  EXAMINATION:  Intracranial interpretation of bilateral common carotid artery arteriograms intracranially.  FINDINGS:  The left common carotid arteriogram demonstrates wide patency of the distal left internal carotid artery in the cervical region.  The petrous, cavernous, and supraclinoid segments are widely patent.  The left middle cerebral artery and left anterior cerebral artery opacify into the capillary and venous phases.  Cross opacification via the anterior communicating artery of the right anterior cerebral artery A-2 segment and distally, and also the right anterior cerebral artery A-1 segment is noted.  Flash filling of the right MCA distribution is also noted.  The right common carotid arteriogram demonstrates nonvisualization of the right internal carotid artery in the cervical region.  There is no opacification of the right internal carotid artery noted intracranially.  There is no reconstitution of the right internal carotid artery in the cavernous region from the external carotid artery branches namely the __________ artery.  IMPRESSION:  Angiographically occluded right internal carotid artery proximally, with no distal reconstitution from the external carotid artery branches.  Partial reconstitution of the right anterior and right middle cerebral artery distribution via the anterior communicating artery from the left internal carotid artery as described above.    ______________________________ Fritz Pickerel. Estanislado Pandy, M.D.   ______________________________ Fritz Pickerel. Estanislado Pandy, M.D.    SKD/MEDQ  D:   08/13/2016  T:  08/13/2016  Job:  [id number]

## 2016-08-14 NOTE — Telephone Encounter (Signed)
Pt. Called to report a "small hard mass in right groin."  Reported he 1st noticed it last week.  Stated it is about "size of pinto bean", and is located at the catheter insertion site.  Stated it is slightly tender. Reported there has been no increase or decrease in size, since he noticed it.  Reported "the extensive bruising of the groin area is improving."   F/u appt. Is scheduled for 08/28/16.  Will report to Dr. Oneida Alar, and call pt. back.

## 2016-08-14 NOTE — Telephone Encounter (Signed)
Discussed with Dr. Oneida Alar.  Recommended to schedule for right groin pseudo. Duplex to r/o pseudoaneurysm.  Will have Scheduler contact the pt.

## 2016-08-14 NOTE — Telephone Encounter (Signed)
Spoke to pt to sch appt 08/15/16; lab at 2:00 and PA at 3:00.

## 2016-08-15 ENCOUNTER — Ambulatory Visit (INDEPENDENT_AMBULATORY_CARE_PROVIDER_SITE_OTHER): Payer: Self-pay | Admitting: Vascular Surgery

## 2016-08-15 ENCOUNTER — Ambulatory Visit (HOSPITAL_COMMUNITY)
Admission: RE | Admit: 2016-08-15 | Discharge: 2016-08-15 | Disposition: A | Discharge status: Home / Self Care | Payer: No Typology Code available for payment source | Source: Ambulatory Visit | Attending: Vascular Surgery | Admitting: Vascular Surgery

## 2016-08-15 ENCOUNTER — Encounter: Payer: Self-pay | Admitting: Vascular Surgery

## 2016-08-15 VITALS — BP 135/74 | HR 61 | Temp 97.1°F | Resp 18 | Ht 68.0 in | Wt 187.0 lb

## 2016-08-15 DIAGNOSIS — I6523 Occlusion and stenosis of bilateral carotid arteries: Secondary | ICD-10-CM

## 2016-08-15 DIAGNOSIS — R1909 Other intra-abdominal and pelvic swelling, mass and lump: Secondary | ICD-10-CM | POA: Diagnosis not present

## 2016-08-15 NOTE — Progress Notes (Signed)
Patient name: Alexander Duran MRN: [id number] DOB: 15-Jun-1946 Sex: male  REASON FOR VISIT: add-on  HPI: Alexander Duran is a 71 y.o. male who presents with one week history of right groin "mass." The size has been unchanged. The patient is s/p left carotid artery stenting on 08/02/16 for asymptomatic left carotid stenosis by Dr. Oneida Alar. He has a right carotid occlusion. Post-operatively, the patient did have some bleeding with his right groin sheath site. This was controlled with manual pressure. He was neuro intact. The patient had no further bleeding by POD 1. His groin was soft with mild ecchymosis. He was discharged home on POD 1.   The patient denies any pain with his right groin. He says that the bruising is improving. He denies any pain with his right leg.   He denies any amaurosis fugax, sudden onset weakness or numbness of the extremities and slurred speech. He is on aspirin and Plavix. He is a former smoker.  Current Outpatient Prescriptions  Medication Sig Dispense Refill  . amLODipine (NORVASC) 10 MG tablet Take 1 tablet (10 mg total) by mouth daily. 30 tablet 6  . aspirin 81 MG chewable tablet Chew 81 mg by mouth every evening.     . clopidogrel (PLAVIX) 75 MG tablet Take 1 tablet (75 mg total) by mouth daily. 30 tablet 6  . DULoxetine (CYMBALTA) 30 MG capsule Take 30 mg by mouth daily at 12 noon.     Marland Kitchen HYDROcodone-acetaminophen (NORCO/VICODIN) 5-325 MG tablet Take 0.5-1 tablets by mouth daily as needed for moderate pain.     Marland Kitchen levothyroxine (SYNTHROID, LEVOTHROID) 75 MCG tablet Take 75 mcg by mouth daily before breakfast.     . lisinopril (PRINIVIL,ZESTRIL) 5 MG tablet Take 1 tablet (5 mg total) by mouth daily. (Patient taking differently: Take 5 mg by mouth every evening. ) 30 tablet 1  . Polyethyl Glycol-Propyl Glycol (SYSTANE OP) Place 1 drop into both eyes daily.      No current facility-administered medications for this visit.     REVIEW OF SYSTEMS:  [X]  denotes  positive finding, [ ]  denotes negative finding Cardiac  Comments:  Chest pain or chest pressure:    Shortness of breath upon exertion:    Short of breath when lying flat:    Irregular heart rhythm:    Constitutional    Fever or chills:      PHYSICAL EXAM: Vitals:   08/15/16 1436 08/15/16 1438 08/15/16 1439  BP: (!) 151/73 136/80 135/74  Pulse: 62 61 61  Resp: 18    Temp: 97.1 F (36.2 C)    SpO2: 99%    Weight: 187 lb (84.8 kg)    Height: 5\' 8"  (1.727 m)      GENERAL: The patient is a well-nourished male, in no acute distress. The vital signs are documented above. CARDIOVASCULAR: There is a regular rate and rhythm. No carotid bruits.  PULMONARY: Non labored respiratory effort.  VASCULAR: Right groin is small hematoma. No pulsatility. 2+ right femoral pulse. Ecchymosis of right buttock, scrotum and thigh. Ecchymosis resolving around groin. 2+ right DP pulse. NEURO: 5/5 strength upper and lower extremities bilaterally. No focal deficits.   DATA:  Right lower extremity duplex 08/15/16  No evidence of pseudoaneurysm right groin. 0.97 x 1.3 thrombosed hematoma right groin   Carotid duplex 08/09/16  Patent left internal carotid artery stent without evidence of stenosis  MEDICAL ISSUES: S/p left carotid artery stent Hematoma right groin  There is no evidence of pseudoaneurysm  on today's duplex study. On physical exam, there is a small hematoma of the right groin without pulsatility. His ecchymosis is improving. Discussed that his symptoms will improve with time.   He has been asymptomatic without TIA or stroke symptoms. Dr. Oneida Alar also saw the patient and reviewed his post-op carotid duplex. The patient will continue Plavix and ASA indefinitely. He will follow up in 6 months with a carotid duplex.  Virgina Jock, PA-C [facility] MD: Oneida Alar

## 2016-08-15 NOTE — Progress Notes (Signed)
Vitals:   08/15/16 1436 08/15/16 1438  BP: (!) 151/73 136/80  Pulse: 62 61  Resp: 18   Temp: 97.1 F (36.2 C)   SpO2: 99%   Weight: 187 lb (84.8 kg)   Height: 5\' 8"  (1.727 m)

## 2016-08-19 ENCOUNTER — Encounter (HOSPITAL_COMMUNITY): Payer: No Typology Code available for payment source

## 2016-08-23 NOTE — Addendum Note (Signed)
Addended by: Lianne Cure A on: 08/23/2016 09:05 AM   Modules accepted: Orders

## 2016-08-28 ENCOUNTER — Encounter: Payer: Non-veteran care | Admitting: Vascular Surgery

## 2016-11-15 ENCOUNTER — Telehealth: Payer: Self-pay | Admitting: *Deleted

## 2016-11-15 ENCOUNTER — Emergency Department (HOSPITAL_COMMUNITY): Payer: Non-veteran care | Admitting: Anesthesiology

## 2016-11-15 ENCOUNTER — Encounter (HOSPITAL_COMMUNITY)
Admission: EM | Disposition: A | Discharge status: Home / Self Care | Payer: Self-pay | Source: Home / Self Care | Attending: Emergency Medicine

## 2016-11-15 ENCOUNTER — Encounter (HOSPITAL_COMMUNITY): Payer: Self-pay | Admitting: Emergency Medicine

## 2016-11-15 ENCOUNTER — Ambulatory Visit (HOSPITAL_COMMUNITY)
Admission: EM | Admit: 2016-11-15 | Discharge: 2016-11-15 | Disposition: A | Discharge status: Home / Self Care | Payer: Non-veteran care | Attending: Emergency Medicine | Admitting: Emergency Medicine

## 2016-11-15 ENCOUNTER — Emergency Department (HOSPITAL_COMMUNITY): Payer: Non-veteran care

## 2016-11-15 DIAGNOSIS — K449 Diaphragmatic hernia without obstruction or gangrene: Secondary | ICD-10-CM | POA: Insufficient documentation

## 2016-11-15 DIAGNOSIS — T18128A Food in esophagus causing other injury, initial encounter: Secondary | ICD-10-CM | POA: Diagnosis not present

## 2016-11-15 DIAGNOSIS — Z836 Family history of other diseases of the respiratory system: Secondary | ICD-10-CM | POA: Insufficient documentation

## 2016-11-15 DIAGNOSIS — Z79891 Long term (current) use of opiate analgesic: Secondary | ICD-10-CM | POA: Diagnosis not present

## 2016-11-15 DIAGNOSIS — I700 Atherosclerosis of aorta: Secondary | ICD-10-CM | POA: Diagnosis not present

## 2016-11-15 DIAGNOSIS — I2510 Atherosclerotic heart disease of native coronary artery without angina pectoris: Secondary | ICD-10-CM | POA: Diagnosis not present

## 2016-11-15 DIAGNOSIS — F458 Other somatoform disorders: Secondary | ICD-10-CM | POA: Insufficient documentation

## 2016-11-15 DIAGNOSIS — M479 Spondylosis, unspecified: Secondary | ICD-10-CM | POA: Diagnosis not present

## 2016-11-15 DIAGNOSIS — K228 Other specified diseases of esophagus: Secondary | ICD-10-CM | POA: Insufficient documentation

## 2016-11-15 DIAGNOSIS — K219 Gastro-esophageal reflux disease without esophagitis: Secondary | ICD-10-CM | POA: Insufficient documentation

## 2016-11-15 DIAGNOSIS — K222 Esophageal obstruction: Secondary | ICD-10-CM | POA: Diagnosis not present

## 2016-11-15 DIAGNOSIS — Z801 Family history of malignant neoplasm of trachea, bronchus and lung: Secondary | ICD-10-CM | POA: Insufficient documentation

## 2016-11-15 DIAGNOSIS — Z923 Personal history of irradiation: Secondary | ICD-10-CM | POA: Insufficient documentation

## 2016-11-15 DIAGNOSIS — Z7982 Long term (current) use of aspirin: Secondary | ICD-10-CM | POA: Insufficient documentation

## 2016-11-15 DIAGNOSIS — I6522 Occlusion and stenosis of left carotid artery: Secondary | ICD-10-CM | POA: Insufficient documentation

## 2016-11-15 DIAGNOSIS — Z9889 Other specified postprocedural states: Secondary | ICD-10-CM | POA: Insufficient documentation

## 2016-11-15 DIAGNOSIS — Z79899 Other long term (current) drug therapy: Secondary | ICD-10-CM | POA: Insufficient documentation

## 2016-11-15 DIAGNOSIS — X58XXXA Exposure to other specified factors, initial encounter: Secondary | ICD-10-CM | POA: Insufficient documentation

## 2016-11-15 DIAGNOSIS — R1319 Other dysphagia: Secondary | ICD-10-CM | POA: Diagnosis not present

## 2016-11-15 DIAGNOSIS — Z7902 Long term (current) use of antithrombotics/antiplatelets: Secondary | ICD-10-CM | POA: Insufficient documentation

## 2016-11-15 DIAGNOSIS — E039 Hypothyroidism, unspecified: Secondary | ICD-10-CM | POA: Insufficient documentation

## 2016-11-15 DIAGNOSIS — Z83518 Family history of other specified eye disorder: Secondary | ICD-10-CM | POA: Insufficient documentation

## 2016-11-15 DIAGNOSIS — I10 Essential (primary) hypertension: Secondary | ICD-10-CM | POA: Insufficient documentation

## 2016-11-15 DIAGNOSIS — Z85818 Personal history of malignant neoplasm of other sites of lip, oral cavity, and pharynx: Secondary | ICD-10-CM | POA: Insufficient documentation

## 2016-11-15 DIAGNOSIS — Z955 Presence of coronary angioplasty implant and graft: Secondary | ICD-10-CM | POA: Diagnosis not present

## 2016-11-15 DIAGNOSIS — R911 Solitary pulmonary nodule: Secondary | ICD-10-CM | POA: Insufficient documentation

## 2016-11-15 DIAGNOSIS — Z7901 Long term (current) use of anticoagulants: Secondary | ICD-10-CM | POA: Insufficient documentation

## 2016-11-15 DIAGNOSIS — Z87891 Personal history of nicotine dependence: Secondary | ICD-10-CM | POA: Insufficient documentation

## 2016-11-15 DIAGNOSIS — K224 Dyskinesia of esophagus: Secondary | ICD-10-CM | POA: Diagnosis not present

## 2016-11-15 HISTORY — PX: ESOPHAGOGASTRODUODENOSCOPY: SHX5428

## 2016-11-15 HISTORY — PX: FOREIGN BODY REMOVAL: SHX962

## 2016-11-15 LAB — CBC WITH DIFFERENTIAL/PLATELET
BASOS PCT: 1 %
Basophils Absolute: 0 10*3/uL (ref 0.0–0.1)
Eosinophils Absolute: 0.1 10*3/uL (ref 0.0–0.7)
Eosinophils Relative: 2 %
HEMATOCRIT: 40 % (ref 39.0–52.0)
HEMOGLOBIN: 13.9 g/dL (ref 13.0–17.0)
LYMPHS ABS: 1.2 10*3/uL (ref 0.7–4.0)
Lymphocytes Relative: 21 %
MCH: 33 pg (ref 26.0–34.0)
MCHC: 34.8 g/dL (ref 30.0–36.0)
MCV: 95 fL (ref 78.0–100.0)
MONOS PCT: 10 %
Monocytes Absolute: 0.6 10*3/uL (ref 0.1–1.0)
NEUTROS ABS: 3.8 10*3/uL (ref 1.7–7.7)
NEUTROS PCT: 66 %
Platelets: 246 10*3/uL (ref 150–400)
RBC: 4.21 MIL/uL — ABNORMAL LOW (ref 4.22–5.81)
RDW: 13.6 % (ref 11.5–15.5)
WBC: 5.7 10*3/uL (ref 4.0–10.5)

## 2016-11-15 LAB — BASIC METABOLIC PANEL
Anion gap: 11 (ref 5–15)
BUN: 13 mg/dL (ref 6–20)
CHLORIDE: 104 mmol/L (ref 101–111)
CO2: 25 mmol/L (ref 22–32)
CREATININE: 0.71 mg/dL (ref 0.61–1.24)
Calcium: 9.4 mg/dL (ref 8.9–10.3)
GFR calc non Af Amer: >60 mL/min (ref >60)
Glucose, Bld: 80 mg/dL (ref 65–99)
Potassium: 4 mmol/L (ref 3.5–5.1)
Sodium: 140 mmol/L (ref 135–145)

## 2016-11-15 IMAGING — CT CT NECK W/O CM
4 of 5 series · 15 of 33 positions shown, 17 images · IV contrast (Omni 300)
Comparison: CT neck [DATE].  CTA of the neck/ [DATE]

CLINICAL DATA: Dysphagia. Globus sensation. Feels like food is
stuck in his airway.

EXAM:
CT NECK WITHOUT CONTRAST
TECHNIQUE: Multidetector CT imaging of the neck was performed following the
standard protocol without intravenous contrast.

[Series 3: neck 2.0 st · axial · 0.57mm/px · z∈[-251,-119]mm · 3 of 132 slices shown (1 of 3)]
[im 33/132  bone]
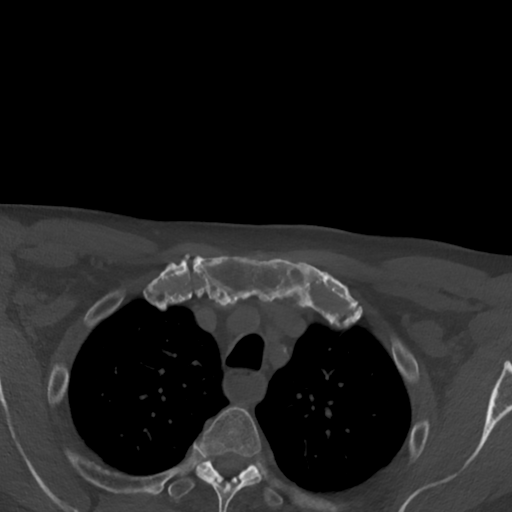
[im 66/132  bone]
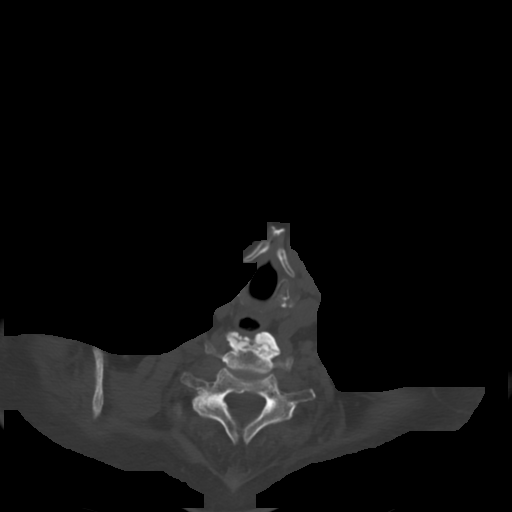
[im 99/132  bone]
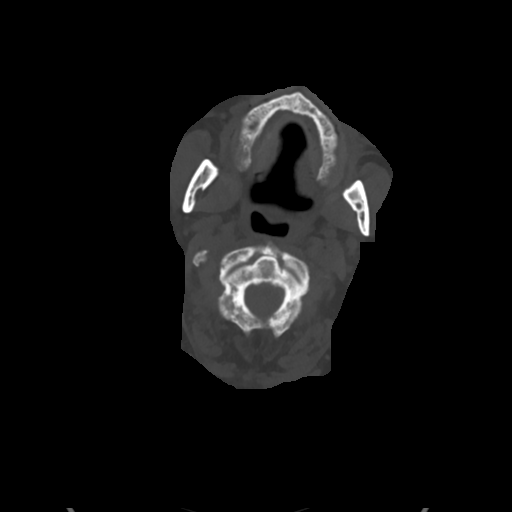

[Series 7: neck 2.0 st · sagittal · 0.54mm/px · 5 of 84 slices shown, 6 images (2 of 3)]
[im 28/84  bone]
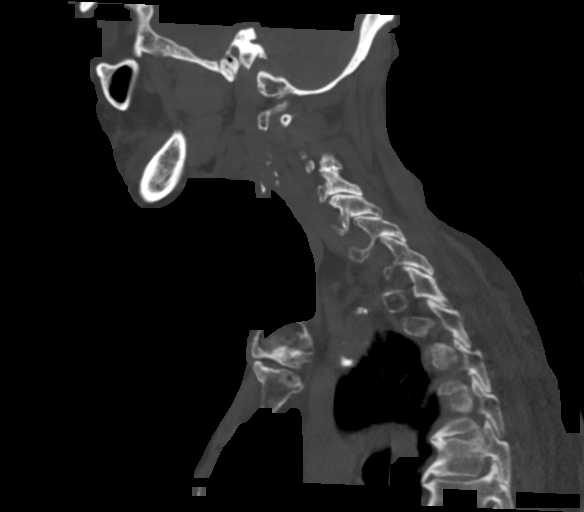
[im 35/84  bone]
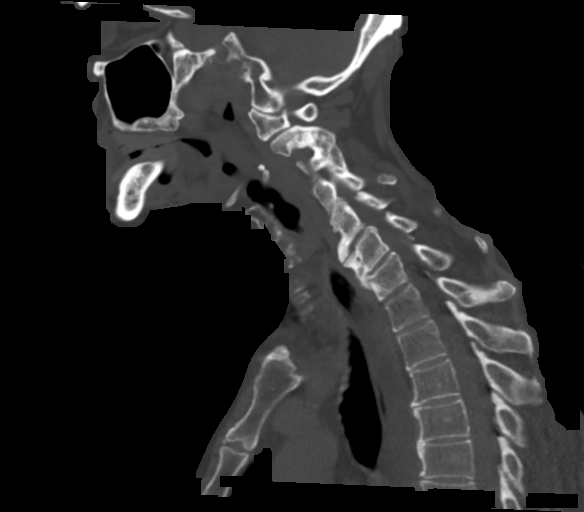
[im 42/84  soft-tissue]
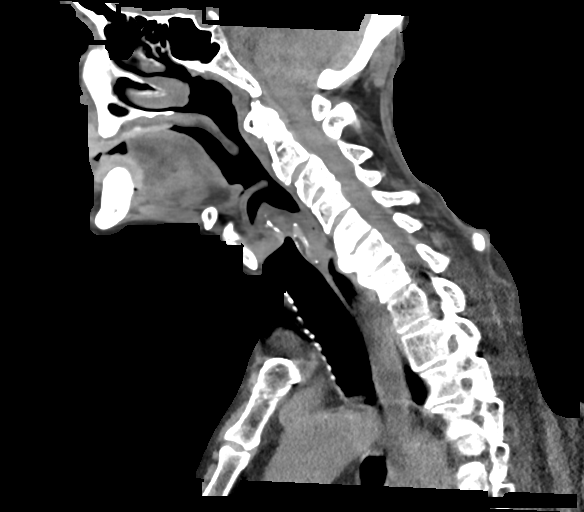
[im 42/84  bone]
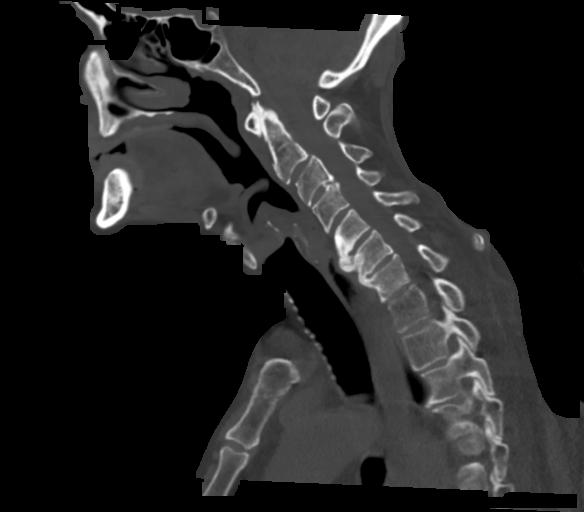
[im 49/84  bone]
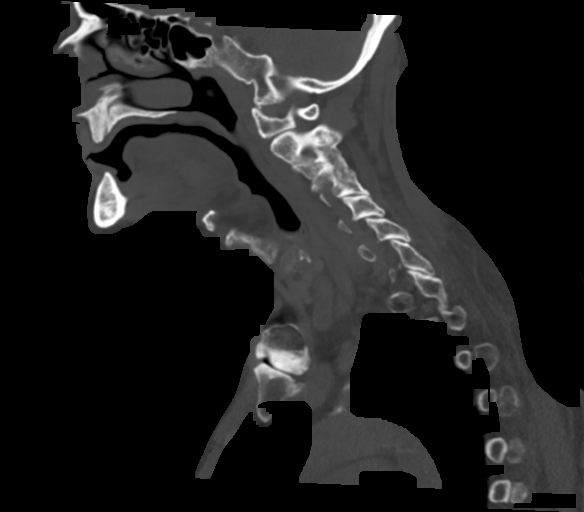
[im 56/84  bone]
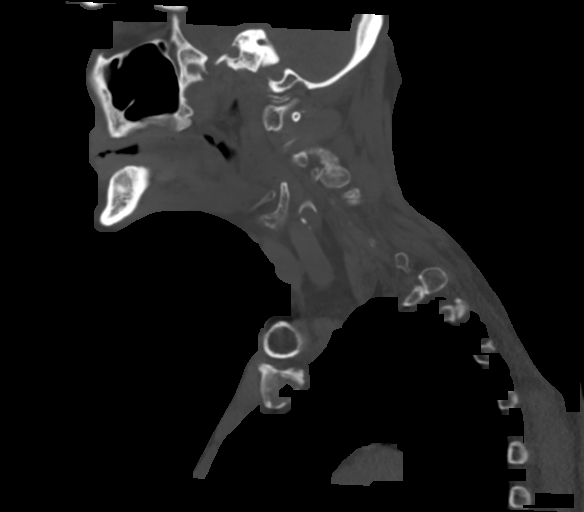

[Series 8: neck 2.0 st · coronal · 0.55mm/px · 3 of 121 slices shown (3 of 3)]
[im 25/121  bone]
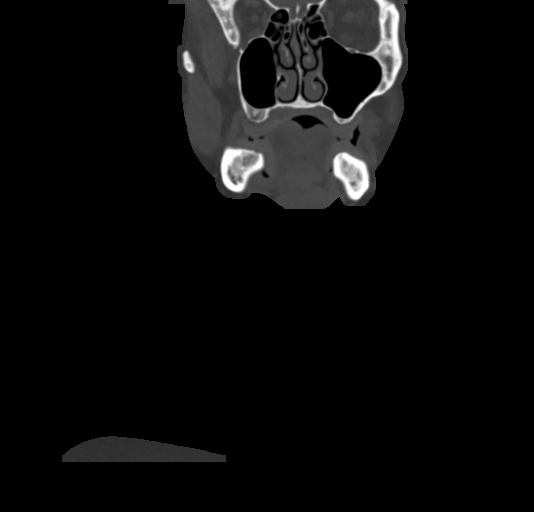
[im 49/121  bone]
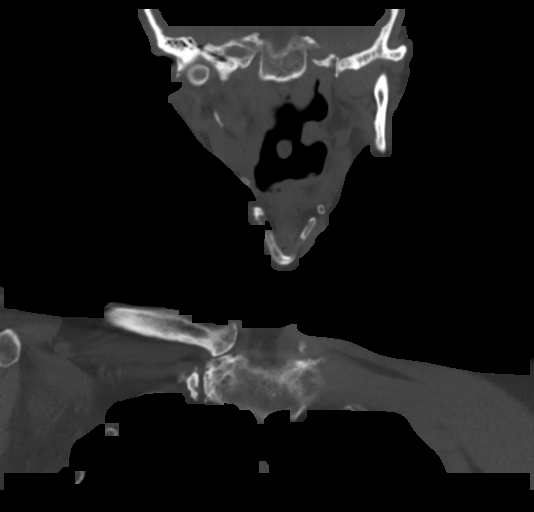
[im 73/121  bone]
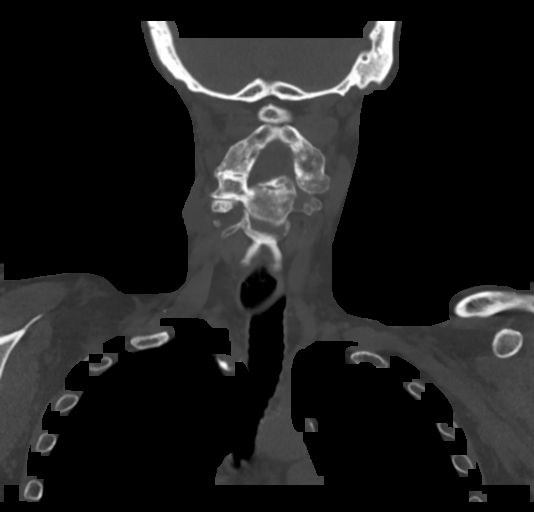

[Series 9: neck 2.0 st orthogonal · axial · 0.52mm/px · z∈[-289,-123]mm · 4 of 142 slices shown, 5 images]
[im 29/142  soft-tissue]
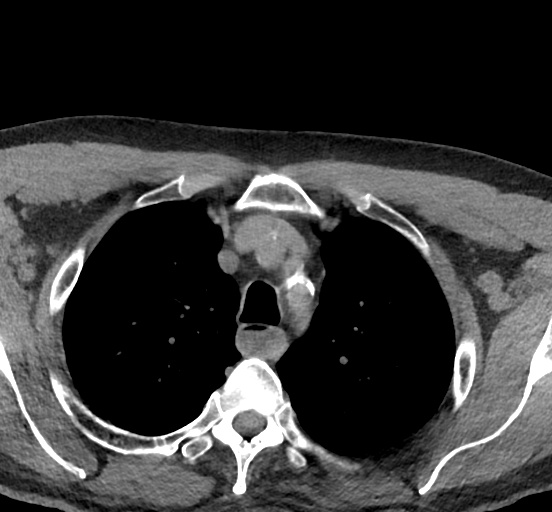
[im 29/142  bone]
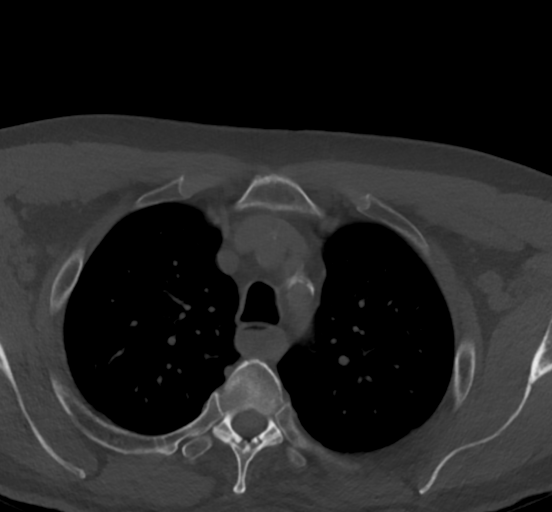
[im 57/142  bone]
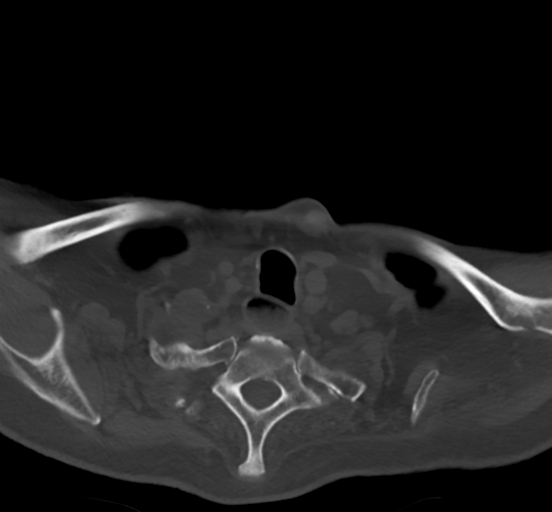
[im 85/142  bone]
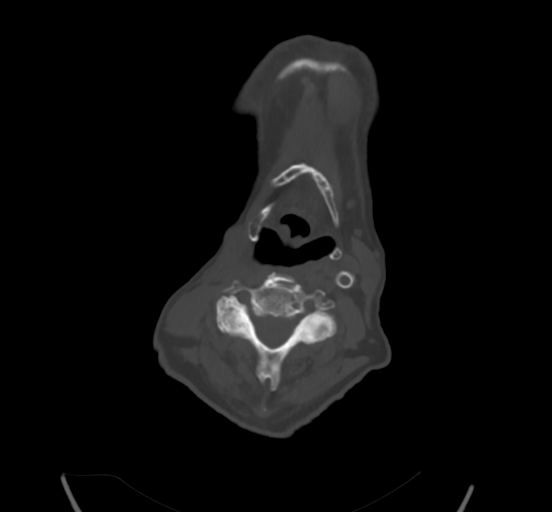
[im 113/142  bone]
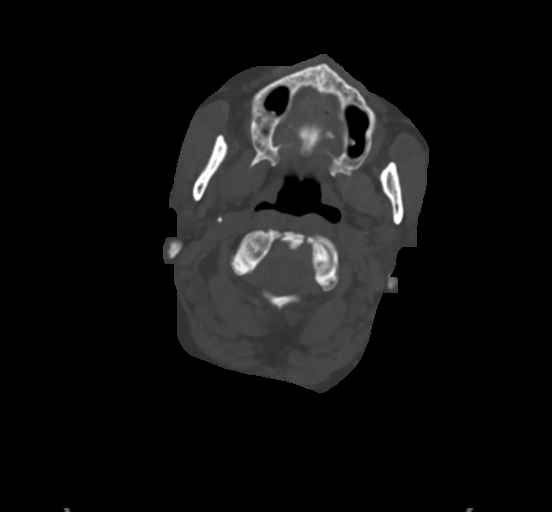

[15 of 33 positions shown; findings below may reference images not displayed]

FINDINGS: Pharynx and larynx: No focal mucosal or submucosal lesions are
present. The nasopharynx, oropharynx, and hypopharynx are clear.
Vocal cords are midline and symmetric. The vocal cords are opposed.

The upper esophagus is distended. There is fluid or soft tissue
within the distended esophagus.

Salivary glands: The right submandibular gland has been resected.
The left submandibular vein is atrophic. Parotid glands are intact
bilaterally.

Thyroid: Normal

Lymph nodes: The right radical neck dissection is again noted. No
residual or recurrent right-sided lymph nodes are present. There is
no significant adenopathy on the left.

Vascular: A left carotid bifurcations stent is in place. Dense
calcifications are present at the carotid bifurcations bilaterally.
The patient is noted have a chronic right ICA occlusion.

Limited intracranial: Unremarkable

Visualized orbits: The normal limits.

Mastoids and visualized paranasal sinuses: The paranasal sinuses and
mastoid air cells are clear.

Skeleton: Anterior osteophytes are fused C5-6 and C6-7. There is
fusion across the disc space and involving the posterior elements at
C2-3 and C3-4. Severe osseous foraminal narrowing is present on the
left C3-4 on the right at C4-5.

Upper chest: Post radiation scarring at the lung apices is stable.

Other: None.
IMPRESSION: 1. The upper esophagus is distended. This may reflect a functional
obstruction or significant dysmotility with marked stasis. This
could certainly account for the patient's sensation of food being
stuck.
2. No foreign body or focal lesion along the esophagus poor
oropharynx.
3. Right radical neck dissection without evidence for disease
recurrence.
4. Left carotid stent.
5. Multilevel cervical spondylosis.

## 2016-11-15 IMAGING — CT CT CHEST W/O CM
2 of 3 series · 15 of 36 positions shown, 18 images · IV contrast (Omni 300)
Comparison: [DATE] chest CT

CLINICAL DATA: Dysphagia for the last few days.

EXAM:
CT CHEST WITHOUT CONTRAST
TECHNIQUE: Multidetector CT imaging of the chest was performed following the
standard protocol without IV contrast.

[Series 5: chest w/o 2mm st · axial · non-contrast · 0.78mm/px · z∈[-527,-215]mm · 12 of 184 slices shown, 15 images]
[im 14/184  mediastinal]
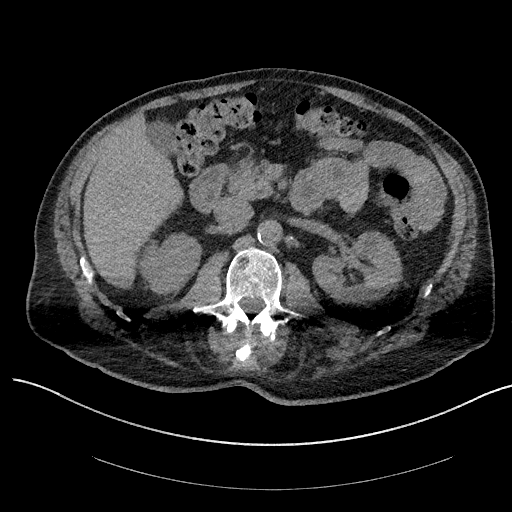
[im 14/184  lung]
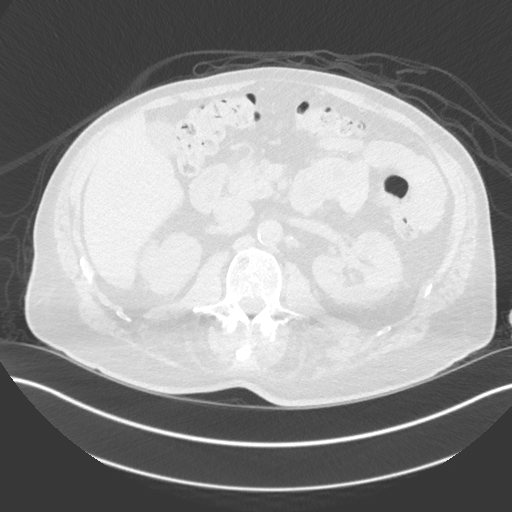
[im 28/184  lung]
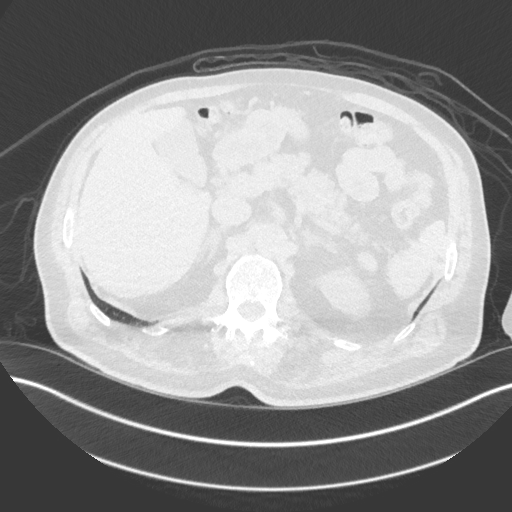
[im 41/184  lung]
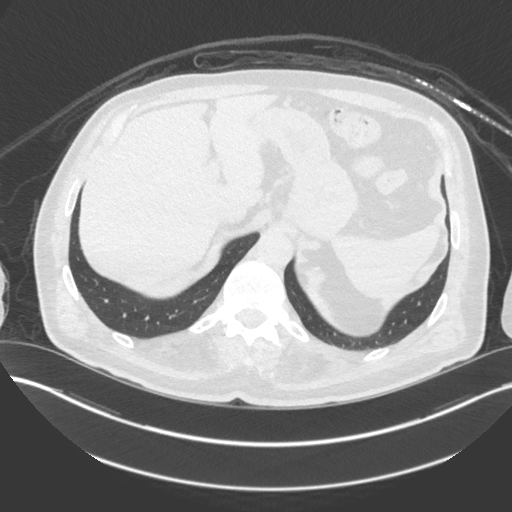
[im 55/184  lung]
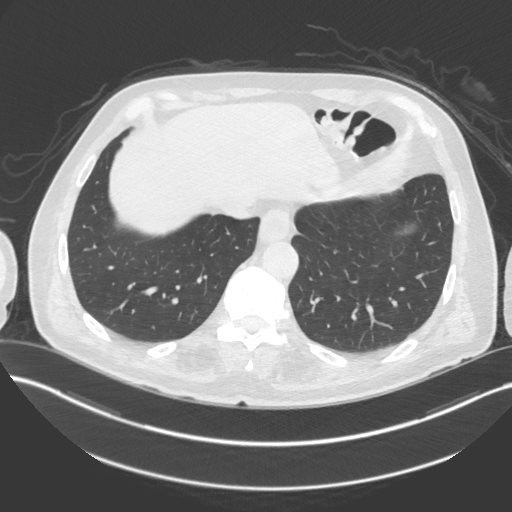
[im 68/184  mediastinal]
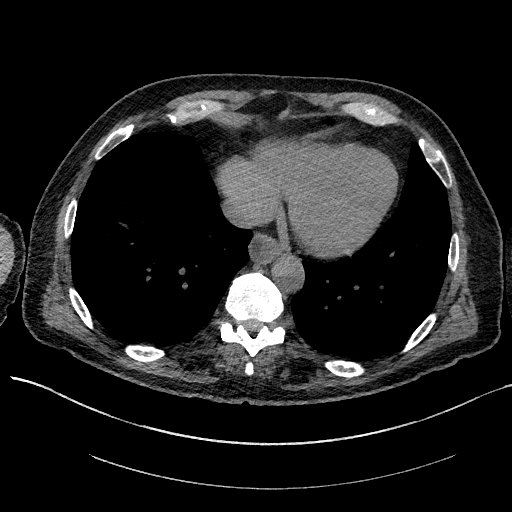
[im 68/184  lung]
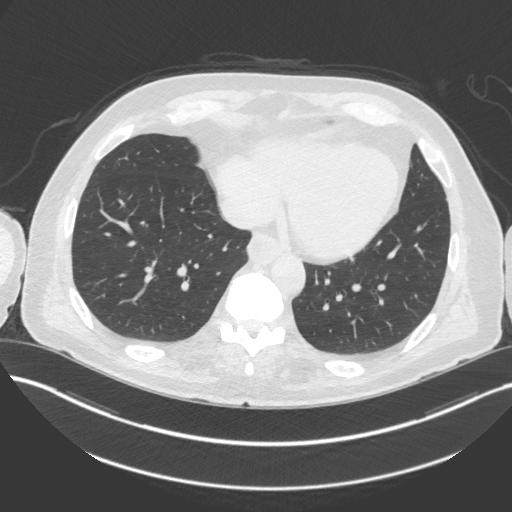
[im 82/184  lung]
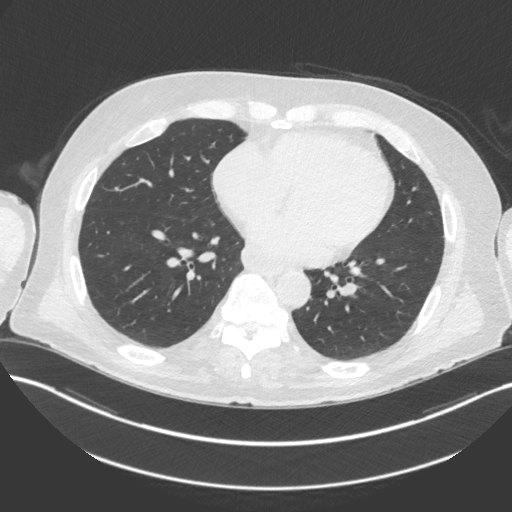
[im 102/184  lung]
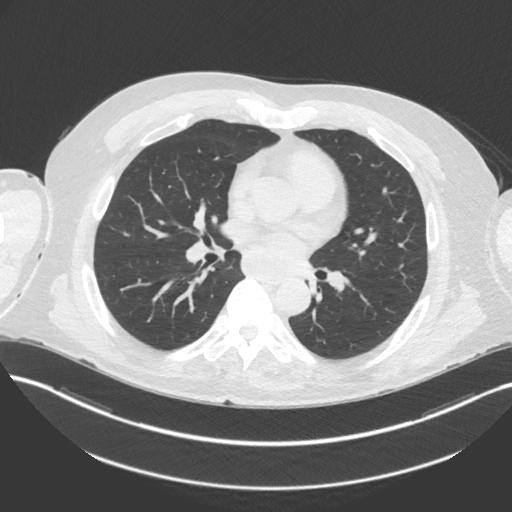
[im 116/184  lung]
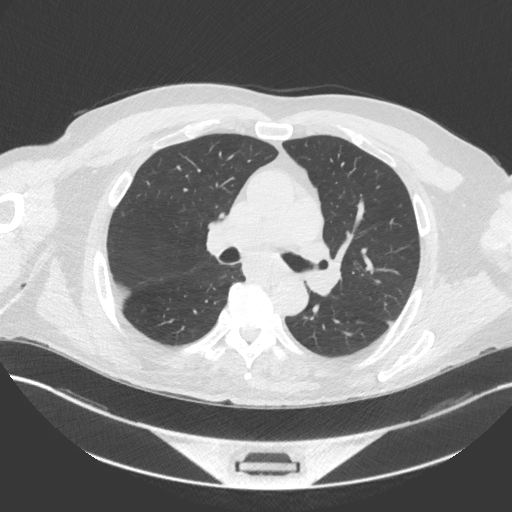
[im 129/184  mediastinal]
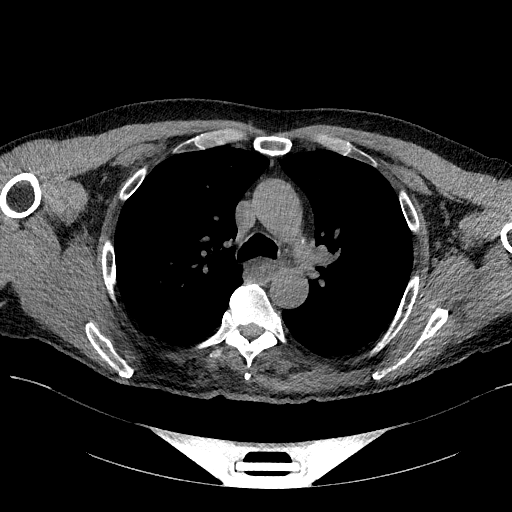
[im 129/184  lung]
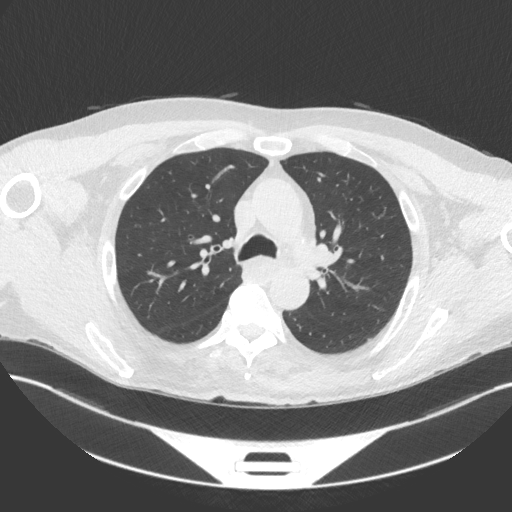
[im 143/184  lung]
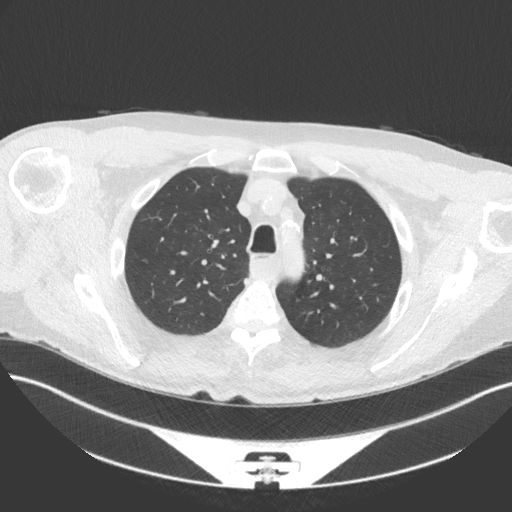
[im 156/184  lung]
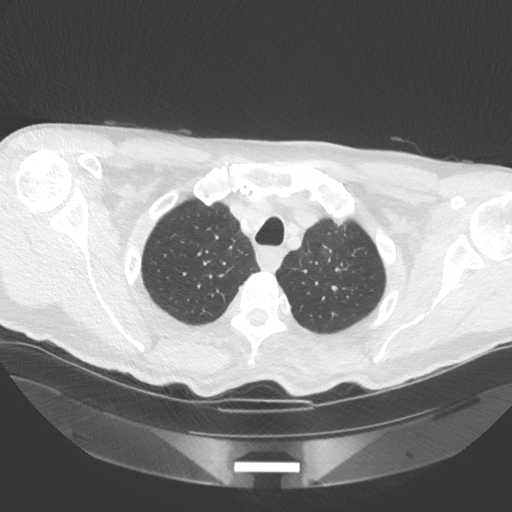
[im 170/184  lung]
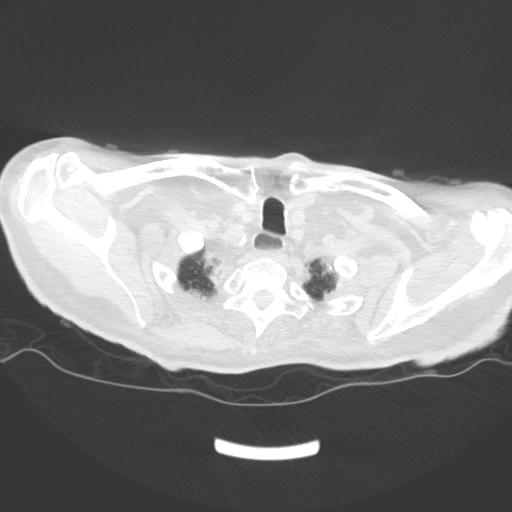

[Series 7: chest w/o 3mm st cor · coronal · non-contrast · 0.72mm/px · 3 of 75 slices shown]
[im 15/75  lung]
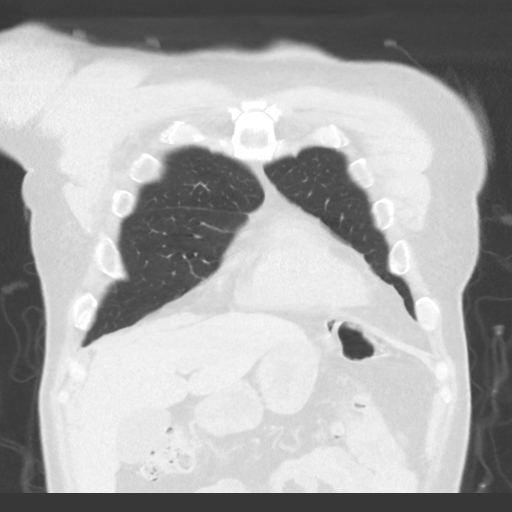
[im 30/75  lung]
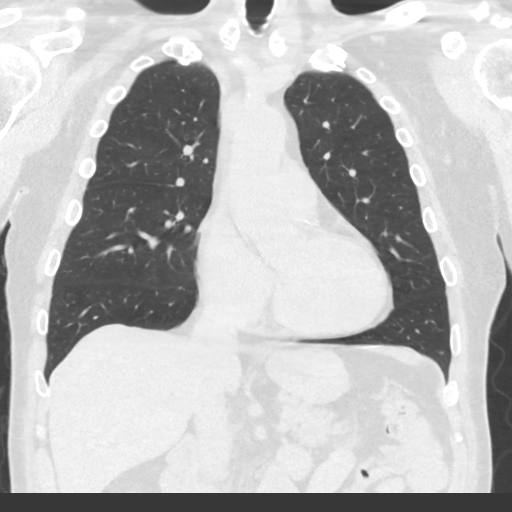
[im 45/75  lung]
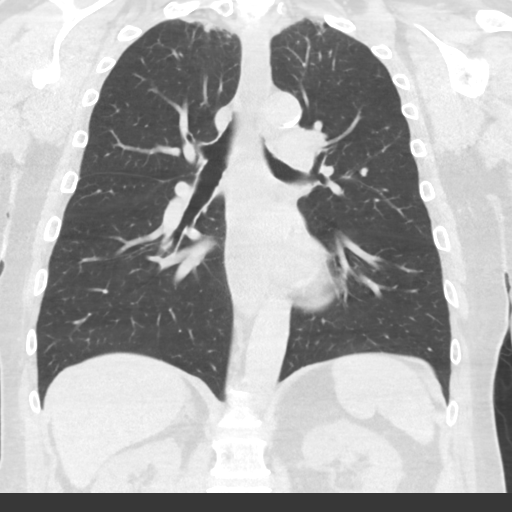

[15 of 36 positions shown; findings below may reference images not displayed]

FINDINGS: Cardiovascular: Top-normal size cardiac chambers without pericardial
effusion. Aortic atherosclerosis at the aortic root at the arch
without aneurysm. Coronary arteriosclerosis is identified along the
LAD. The unenhanced pulmonary arteries are unremarkable.

Mediastinum/Nodes: Fluid-filled esophagus to the upper third with
slight thickened appearance near the GE junction the without
definite mass. Reflux is not excluded. Subtle stricturing is
likewise not entirely excluded. This may benefit from fluoroscopic
assessment with an upper GI study. Atrophic appearing thyroid gland.
No enlarged axillary, mediastinal or hilar lymph nodes.

Lungs/Pleura: No pneumothorax or effusion. Pleural-parenchymal
scarring or post radiative change in the lung apices as before. No
pneumonic consolidation. No dominant mass. Tiny 1-2 mm subpleural
nodule in the right upper lobe series 6, image 64 stable.

Upper Abdomen: No acute abnormality.

Musculoskeletal: No chest wall mass or suspicious bone lesions
identified. Mild degenerative changes along the dorsal spine with
right central partially calcified disc at T9-10, unchanged in
appearance.
IMPRESSION: 1. Fluid-filled dilated esophagus noted to the proximal third.
Reflux might account for this. No definite mass is apparent.
Findings could be further correlated with an upper GI study over
better assessment.
2. Coronary arteriosclerosis and aortic atherosclerosis.
3. No acute pulmonary abnormality. Tiny 1-2 mm subpleural nodule in
the right upper lobe is stable.

## 2016-11-15 SURGERY — EGD (ESOPHAGOGASTRODUODENOSCOPY)
Anesthesia: General

## 2016-11-15 MED ORDER — SODIUM CHLORIDE 0.9 % IV BOLUS (SEPSIS): 1000.0000 mL | Freq: Once | INTRAVENOUS | Status: DC

## 2016-11-15 MED ORDER — LIDOCAINE HCL (CARDIAC) 20 MG/ML IV SOLN
INTRAVENOUS | Status: DC | PRN
  Administered 2016-11-15: 80 mg via INTRAVENOUS

## 2016-11-15 MED ORDER — ONDANSETRON HCL 4 MG/2ML IJ SOLN
INTRAMUSCULAR | Status: DC | PRN
  Administered 2016-11-15: 4 mg via INTRAVENOUS

## 2016-11-15 MED ORDER — FENTANYL CITRATE (PF) 250 MCG/5ML IJ SOLN
INTRAMUSCULAR | Status: AC
  Filled 2016-11-15: qty 5

## 2016-11-15 MED ORDER — FENTANYL CITRATE (PF) 100 MCG/2ML IJ SOLN
INTRAMUSCULAR | Status: DC | PRN
  Administered 2016-11-15: 100 ug via INTRAVENOUS

## 2016-11-15 MED ORDER — SUCCINYLCHOLINE CHLORIDE 20 MG/ML IJ SOLN
INTRAMUSCULAR | Status: DC | PRN
  Administered 2016-11-15: 100 mg via INTRAVENOUS

## 2016-11-15 MED ORDER — LACTATED RINGERS IV SOLN
INTRAVENOUS | Status: DC | PRN
  Administered 2016-11-15: 17:00:00 via INTRAVENOUS

## 2016-11-15 MED ORDER — PHENYLEPHRINE HCL 10 MG/ML IJ SOLN
INTRAMUSCULAR | Status: DC | PRN
  Administered 2016-11-15 (×2): 120 ug via INTRAVENOUS
  Administered 2016-11-15: 160 ug via INTRAVENOUS

## 2016-11-15 MED ORDER — EPHEDRINE SULFATE 50 MG/ML IJ SOLN
INTRAMUSCULAR | Status: DC | PRN
  Administered 2016-11-15: 15 mg via INTRAVENOUS
  Administered 2016-11-15: 10 mg via INTRAVENOUS

## 2016-11-15 MED ORDER — PROPOFOL 10 MG/ML IV BOLUS
INTRAVENOUS | Status: DC | PRN
  Administered 2016-11-15: 150 mg via INTRAVENOUS

## 2016-11-15 NOTE — Transfer of Care (Signed)
Immediate Anesthesia Transfer of Care Note  Patient: Alexander Duran  Procedure(s) Performed: Procedure(s): ESOPHAGOGASTRODUODENOSCOPY (EGD) (N/A) FOREIGN BODY REMOVAL (N/A)  Patient Location: Endoscopy   Anesthesia Type:General  Level of Consciousness: awake, alert , oriented and patient cooperative  Airway & Oxygen Therapy: Patient Spontanous Breathing  Post-op Assessment: Report given to RN and Post -op Vital signs reviewed and stable  Post vital signs: Reviewed and stable  Last Vitals:  Vitals:   11/15/16 1454 11/15/16 1621  BP: 139/69 (!) 158/72  Pulse: 85 69  Resp: 18 15  Temp:  36.6 C    Last Pain:  Vitals:   11/15/16 1627  TempSrc:   PainSc: 5          Complications: No apparent anesthesia complications

## 2016-11-15 NOTE — Op Note (Signed)
[facility] Patient Name: Alexander Duran Procedure Date : 11/15/2016 MRN: [id number] Attending MD: Estill Cotta. Loletha Carrow , MD Date of Birth: 05-11-1946 CSN: [id number] Age: 71 Admit Type: Inpatient Procedure:                Upper GI endoscopy Indications:              Foreign body in the esophagus (food impaction) Providers:                Estill Cotta. Loletha Carrow, MD, Zenon Mayo, RN, Cletis Athens,                            Technician, Tinnie Gens, Technician Referring MD:              Medicines:                General Anesthesia Complications:            No immediate complications. Estimated Blood Loss:     Estimated blood loss: none. Procedure:                Pre-Anesthesia Assessment:                           - Prior to the procedure, a History and Physical                            was performed, and patient medications and                            allergies were reviewed. The patient's tolerance of                            previous anesthesia was also reviewed. The risks                            and benefits of the procedure and the sedation                            options and risks were discussed with the patient.                            All questions were answered, and informed consent                            was obtained. Prior Anticoagulants: The patient has                            taken Plavix (clopidogrel), last dose was 1 day                            prior to procedure. ASA Grade Assessment: III - A                            patient with severe systemic disease. After  reviewing the risks and benefits, the patient was                            deemed in satisfactory condition to undergo the                            procedure.                           After obtaining informed consent, the endoscope was                            passed under direct vision. Throughout the                            procedure, the patient's  blood pressure, pulse, and                            oxygen saturations were monitored continuously. The                            [id number]-[id number] ([id number]) scope was introduced through the                            mouth, and advanced to the second part of duodenum.                            The upper GI endoscopy was accomplished without                            difficulty. The patient tolerated the procedure                            well.                           OF NOTE, THIS PATIENT WAS ELECTIVELY INTUBATED FOR                            AIRWAY PROTECTION. HOWEVER, IT WAS A DIFFICULT                            INTUBATION DUE TO HIS PRIOR NECK SURGERY. A VIDEO                            LARYNGYSCOPE AND GUIDE STYLET WERE NEEDED IN ORDER                            TO INTUBATE THE PATIENT. Scope In: Scope Out: Findings:      The lumen of the esophagus was moderately dilated.      Abnormal motility was noted in the esophagus. There is a decrease in       motility of the esophageal body. Normal peristalsis not noted.      Semi-formed food and a large amount  of liquid was found in the entire       esophagus. This was flushed and pushed into the stomach for complete       clearance of the esophagus. No esophageal stricture was found, and there       was no resistance passing the scope thruough the EGJ.      The stomach was normal.      The cardia and gastric fundus were normal on retroflexion.      The examined duodenum was normal. Impression:               - Dilation in the entire esophagus.                           - Abnormal esophageal motility, consistent with                            aperistalsis.                           - Food in the esophagus.                           - Normal stomach.                           - Normal examined duodenum.                           - No specimens collected. Moderate Sedation:      GETA Recommendation:           - Patient has a contact  number available for                            emergencies. The signs and symptoms of potential                            delayed complications were discussed with the                            patient. Return to normal activities tomorrow.                            Written discharge instructions were provided to the                            patient.                           Discharge home from ED today.                           - Resume previous diet. Cut and chew food well,                            with something to drink after every few bites.                           -  Continue present medications.                           - Return to [place] primary care physician at the next                            available appointment for GI referral requesting                            esophageal manometry study. The patient could also                            be seen in my clinic if authorized to receive care                            outside [place] system.(he does have Medicare) Procedure Code(s):        --- Professional ---                           [id number], Esophagogastroduodenoscopy, flexible,                            transoral; diagnostic, including collection of                            specimen(s) by brushing or washing, when performed                            (separate procedure) Diagnosis Code(s):        --- Professional ---                           K22.8, Other specified diseases of esophagus                           K22.4, Dyskinesia of esophagus                           T18.128A, Food in esophagus causing other injury,                            initial encounter                           T18.108A, Unspecified foreign body in esophagus                            causing other injury, initial encounter CPT copyright 2016 American Medical Association. All rights reserved. The codes documented in this report are preliminary and upon coder review may  be revised to meet  current compliance requirements.  L. Loletha Carrow, MD 11/15/2016 5:16:42 PM This report has been signed electronically. Number of Addenda: 0

## 2016-11-15 NOTE — Telephone Encounter (Signed)
Returned call to patient regarding difficulty swallowing. Pt instructed to go to [facility] ED. Verbalized understanding

## 2016-11-15 NOTE — Telephone Encounter (Signed)
Patient called to triage this morning to report difficulty swallowing since yesterday morning. He is S/P Left carotid angiogram, left carotid angioplasty and stenting on 08-02-16 by Drs. Fields and [employer].  He also has Hx of Tonsillar cancer. He was barely able to take his medications yesterday and has not been able to take them this morning. He is able to talk but he speaks like he has "Hot potatoes" in his mouth like someone with a peritonsilar abscess. Patient reports no problems with breathing at this time. He is afebrile and has no pain in his throat. He does report "having some mild hiatal hernia discomfort". He reports no S&S of CVA or TIA.   I told patient to call Dr. Noreene Filbert office and Dr. Calton Dach office asap because of his history. Patient agreed with this plan and will contact his ENT immediately.

## 2016-11-15 NOTE — ED Provider Notes (Signed)
[facility] DEPT Provider Note   CSN: [id number] Arrival date & time: 11/15/16  1107     History   Chief Complaint Chief Complaint  Patient presents with  . Dysphagia    HPI Alexander Duran is a 71 y.o. male.  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 He reports that he intermittently gets these symptoms but they typically tend to resolve on their own within an hour however he has been unable to swallow even small amounts of fluid since yesterday morning after having scrambled eggs for breakfast. He is still able to tolerate his own secretions. No alleviating factors. Denies any chest pain/discomfort, nausea, vomiting, abdominal pain, trouble breathing or shortness of breath. Denies any other physical complaints.   Past Medical History:  Diagnosis Date  . Cancer ([place])    Tonsil cancer  . Carotid artery stenosis without cerebral infarction 08/30/2015  . Hypertension   . Hypothyroidism 05/09/2014  . Nausea in adult 08/10/2015  . Other headache syndrome 08/10/2015  . Thyroid disease   . Weight loss 08/08/2015    Patient Active Problem List   Diagnosis Date Noted  . Left carotid stenosis 08/02/2016  . Carotid disease, bilateral ([place]) 09/14/2015  . Carotid artery stenosis without cerebral infarction 08/30/2015  . Other headache syndrome 08/10/2015  . Nausea in adult 08/10/2015  . Weight loss 08/08/2015  . Hypothyroidism 05/09/2014  . Essential hypertension 05/09/2014  . Tonsil cancer ([place]) 05/04/2013    Past Surgical History:  Procedure Laterality Date  . BACK SURGERY    . CAROTID PTA/STENT INTERVENTION Left 08/02/2016   Procedure: Carotid PTA/Stent Intervention;  Surgeon: Elam Dutch, MD;  Location: [facility];  Service: Cardiovascular;  Laterality: Left;  . PERIPHERAL VASCULAR CATHETERIZATION N/A 07/04/2016   Procedure: Aortic Arch Angiography;  Surgeon: Conrad Wilson, MD;  Location: [place] [facility];  Service: Cardiovascular;  Laterality: N/A;  . PERIPHERAL VASCULAR CATHETERIZATION N/A 07/04/2016   Procedure: Carotid & cerebral  Angiography;  Surgeon: Conrad Smolan, MD;  Location: [facility];  Service: Cardiovascular;  Laterality: N/A;  . TUMOR REMOVAL     in neck       Home Medications    Prior to Admission medications   Medication Sig Start Date End Date Taking? Authorizing Provider  amLODipine (NORVASC) 10 MG tablet Take 1 tablet (10 mg total) by mouth daily. 08/10/15   Heath Lark, MD  aspirin 81 MG chewable tablet Chew 81 mg by mouth every evening.     [provider]  clopidogrel (PLAVIX) 75 MG tablet Take 1 tablet (75 mg total) by mouth daily. 07/11/16   Elam Dutch, MD  DULoxetine (CYMBALTA) 30 MG capsule Take 30 mg by mouth daily at 12 noon.     [provider]  HYDROcodone-acetaminophen (NORCO/VICODIN) 5-325 MG tablet Take 0.5-1 tablets by mouth daily as needed for moderate pain.     [provider]  levothyroxine (SYNTHROID, LEVOTHROID) 75 MCG tablet Take 75 mcg by mouth daily before breakfast.     [provider]  lisinopril (PRINIVIL,ZESTRIL) 5 MG tablet Take 1 tablet (5 mg total) by mouth daily. Patient taking differently: Take 5 mg by mouth every evening.  08/10/15   Heath Lark, MD  Polyethyl Glycol-Propyl Glycol (SYSTANE OP) Place 1 drop into both eyes daily.     [provider]  Family History Family History  Problem Relation Age of Onset  . Cancer Father        lung cancer  . COPD Mother   . Macular degeneration Mother     Social History Social History  Substance Use Topics  . Smoking status: Former Smoker    Packs/day: 1.00    Years: 25.00    Quit date: 05/04/2001  . Smokeless tobacco: Never Used  . Alcohol use 1.2 oz/week    2 Shots of liquor per week     Comment: rarely     Allergies   Patient  has no known allergies.   Review of Systems Review of Systems prostatitis   Physical Exam Updated Vital Signs BP (!) 147/73 (BP Location: Right Arm)   Pulse 92   Temp 98.4 F (36.9 C) (Oral)   Resp 12   SpO2 97%   Physical Exam  Constitutional: He is oriented to person, place, and time. He appears well-developed and well-nourished. No distress.  HENT:  Head: Normocephalic and atraumatic.  Nose: Nose normal.  Mouth/Throat: No posterior oropharyngeal edema or posterior oropharyngeal erythema. No tonsillar exudate.  Regional neck skin and muscular changes/atrophy likely secondary to radiation  Eyes: Conjunctivae and EOM are normal. Pupils are equal, round, and reactive to light. Right eye exhibits no discharge. Left eye exhibits no discharge. No scleral icterus.  Neck: Normal range of motion. Neck supple.  Cardiovascular: Normal rate and regular rhythm.  Exam reveals no gallop and no friction rub.   No murmur heard. Pulmonary/Chest: Effort normal and breath sounds normal. No stridor. No tachypnea. No respiratory distress. He has no rales.  Abdominal: Soft. He exhibits no distension. There is no tenderness.  Musculoskeletal: He exhibits no edema or tenderness.  Neurological: He is alert and oriented to person, place, and time.  Skin: Skin is warm and dry. No rash noted. He is not diaphoretic. No erythema.  Psychiatric: He has a normal mood and affect.  Vitals reviewed.    ED Treatments / Results  Labs (all labs ordered are listed, but only abnormal results are displayed) Labs Reviewed - No data to display  EKG  EKG Interpretation None       Radiology No results found.  Procedures Procedures (including critical care time)  Medications Ordered in ED Medications - No data to display   Initial Impression / Assessment and Plan / ED Course  I have reviewed the triage vital signs and the nursing notes.  Pertinent labs & imaging results that were available during my  care of the patient were reviewed by me and considered in my medical decision making (see chart for details).  Clinical Course as of Nov 16 1631  Fri Nov 15, 2016  1448 CT with possible food bolus. Will attempt to dislodge with saltine and coke.  [PC]  1455 Unsuccessful. Feel pt is high risk given his prior neck radiation to attempt glucagon. Will discuss case with GI. Will get labs and IVF in the interim.  [PC]  2458 GI to see pt in ED.  [PC]  0998 GI to scope patient.  [PC]    Clinical Course User Index [PC] Cardama, Grayce Sessions, MD      Final Clinical Impressions(s) / ED Diagnoses   Final diagnoses:  Other dysphagia      Cardama, Grayce Sessions, MD 11/15/16 [phone]

## 2016-11-15 NOTE — Anesthesia Procedure Notes (Addendum)
Procedure Name: Intubation Date/Time: 11/15/2016 4:51 PM Performed by: Oletta Lamas Pre-anesthesia Checklist: Patient identified, Emergency Drugs available, Suction available and Patient being monitored Patient Re-evaluated:Patient Re-evaluated prior to inductionOxygen Delivery Method: Circle System Utilized Preoxygenation: Pre-oxygenation with 100% oxygen Intubation Type: Rapid sequence and Cricoid Pressure applied Laryngoscope Size: Glidescope and 3 Grade View: Grade III Tube type: Oral Number of attempts: 1 Airway Equipment and Method: Stylet Placement Confirmation: ETT inserted through vocal cords under direct vision,  positive ETCO2 and breath sounds checked- equal and bilateral Secured at: 22 cm Tube secured with: Tape Dental Injury: Teeth and Oropharynx as per pre-operative assessment  Difficulty Due To: Difficulty was anticipated, Difficult Airway-  due to edematous airway and Difficult Airway- due to immobile epiglottis Comments: Epiglotis floppy. Mac 4 =grade 4 view.  Mac 3 Glidscope provided grade 2 view.  Glottis swollen with no view of vocal cords  Blue bougie inserted  into trachea. Tube threaded over bougie.   Maintained saturation well.

## 2016-11-15 NOTE — ED Triage Notes (Signed)
Pt reports hx of tonsilar cancer in 2003, had radiation had left carotid stented in February, pt reports yesterday he began having difficulty swallowing that did not subside, has had similar issues in the past but symptoms usually resolve within an hour. Pt a/ox4, resp e/u.

## 2016-11-15 NOTE — Consult Note (Addendum)
[facility] Consult: 3:21 PM 11/15/2016  LOS: 0 days    Referring Provider: Dr Leonette Monarch  Primary Care Physician:  Windy Fast, MD at [facility].   Primary Gastroenterologist:  unassigned     Reason for Consultation:  Dysphagia   HPI: Alexander Duran is a 71 y.o. male.  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  Carotid artery stenosis, s/p carotid artery stenting 07/2016.   On Plavix.  Hypothyroidism. Hiatal hernia, on no GERD meds.  Hx intermittent dysphagia, primarily to solid foods.  Normally whatever gets stuck will pass in 15 to 60 minutes.  Has not sought medical attention for the problem.  Has never seen GI or had EGD/Colonoscopy. Yesterday AM, awoke and took po meds as usual with a liter or so of water.  Able to swallow fine.  Then eating breakfast of scrambled eggs and canned [place] tomatoes, developed dysphagia and it never passed.  Unable to tolerate po intake after that though has managed to handle oral secretions.  Cracker and cola challenge after arrival to ED this afternoon resulting in regurgitation, vomiting of pale brownish material.   Chest discomfort and sense that something is stuck at [employer].  Oliguric, getting IV fluid once IV established.  BMET without AKI.     Chest, neck CT: fluid in distal esophagus, no mass.  Stable subpleural RUL lung nodule. Coronary arteriosclerosis and aortic atherosclerosis.    Family hx lung cancer in father.  Infrequent ETOH.       Past Medical History:  Diagnosis Date  . Cancer ([place])    Tonsil cancer  . Carotid artery stenosis without cerebral infarction 08/30/2015  . Hypertension   . Hypothyroidism 05/09/2014  . Nausea in adult 08/10/2015  . Other headache syndrome 08/10/2015  . Thyroid disease   .  Weight loss 08/08/2015    Past Surgical History:  Procedure Laterality Date  . BACK SURGERY    . CAROTID PTA/STENT INTERVENTION Left 08/02/2016   Procedure: Carotid PTA/Stent Intervention;  Surgeon: Elam Dutch, MD;  Location: [facility];  Service: Cardiovascular;  Laterality: Left;  . PERIPHERAL VASCULAR CATHETERIZATION N/A 07/04/2016   Procedure: Aortic Arch Angiography;  Surgeon: Conrad Foley, MD;  Location: [facility];  Service: Cardiovascular;  Laterality: N/A;  . PERIPHERAL VASCULAR CATHETERIZATION N/A 07/04/2016   Procedure: Carotid & cerebral  Angiography;  Surgeon: Conrad Eureka, MD;  Location: [facility];  Service: Cardiovascular;  Laterality: N/A;  . TUMOR REMOVAL     in neck    Prior to Admission medications   Medication Sig Start Date End Date Taking? Authorizing Provider  amLODipine (NORVASC) 10 MG tablet Take 1 tablet (10 mg total) by mouth daily. 08/10/15  Yes Heath Lark, MD  aspirin 81 MG chewable tablet Chew 81 mg by mouth every evening.    Yes [provider]  clopidogrel (PLAVIX) 75 MG tablet Take 1 tablet (75 mg total) by mouth daily. 07/11/16  Yes FieldsJessy Oto, MD  DULoxetine (CYMBALTA) 30  MG capsule Take 30 mg by mouth daily at 12 noon.    Yes [provider]  HYDROcodone-acetaminophen (NORCO/VICODIN) 5-325 MG tablet Take 0.5-1 tablets by mouth daily as needed for moderate pain.    Yes [provider]  levothyroxine (SYNTHROID, LEVOTHROID) 75 MCG tablet Take 75 mcg by mouth daily before breakfast.    Yes [provider]  lisinopril (PRINIVIL,ZESTRIL) 5 MG tablet Take 1 tablet (5 mg total) by mouth daily. Patient taking differently: Take 5 mg by mouth every evening.  08/10/15  Yes Heath Lark, MD  Polyethyl Glycol-Propyl Glycol (SYSTANE OP) Place 1 drop into both eyes daily.    Yes [provider]    Scheduled Meds:  Infusions: . sodium chloride     PRN Meds:    Allergies as of  11/15/2016  . (No Known Allergies)    Family History  Problem Relation Age of Onset  . Cancer Father        lung cancer  . COPD Mother   . Macular degeneration Mother     Social History   Social History  . Marital status: Married    Spouse name: N/A  . Number of children: N/A  . Years of education: N/A   Occupational History  . Not on file.   Social History Main Topics  . Smoking status: Former Smoker    Packs/day: 1.00    Years: 25.00    Quit date: 05/04/2001  . Smokeless tobacco: Never Used  . Alcohol use 1.2 oz/week    2 Shots of liquor per week     Comment: rarely  . Drug use: No  . Sexual activity: Not on file   Other Topics Concern  . Not on file   Social History Narrative  . No narrative on file    REVIEW OF SYSTEMS: Constitutional:  Generally no weakness or fatigue ENT:  No nose bleeds Pulm:  No SOB or cough CV:  No palpitations, no LE edema. Lower sternal pain, not anginal  GU:  No hematuria, no frequency GI:  Per HPI.  Has declined offer/suggestion for colonoscopy in past "Don't see the need" Heme:  Bruises easily but no excessive hematomas or bleeding   Transfusions:  None.  Neuro:  No headaches, no peripheral tingling or numbness Derm:  No itching, no rash or sores.  No hx skin cancer  Endocrine:  No sweats or chills.  No polyuria or dysuria Immunization:  Not queried Travel:  None beyond local counties in last few months.    PHYSICAL EXAM: Vital signs in last 24 hours: Vitals:   11/15/16 1427 11/15/16 1454  BP: 135/74 139/69  Pulse: 80 85  Resp:  18  Temp:     Wt Readings from Last 3 Encounters:  08/15/16 84.8 kg (187 lb)  08/02/16 84.8 kg (187 lb)  07/17/16 83.5 kg (184 lb)    General: pleasant, looks well Head:  No asymmetry or swelling  Eyes:  No icterus or pallor Ears:  Not HOH  Nose:  No congestion, no discharge Mouth:  Moist and clear oral MM.  Tongue midline.  Good dental repair. Asymmetry right side of mouth from prior  surgery. Neck:  Deficit on right from cancer surgery.  No TMG or masses Lungs:  Clear bil.  No cough or dyspnea Heart: RRR.  No MRG.  S1, S2 present.   Abdomen:  Soft, NT, ND.  No HSM, no masses.  No bruits or hernias.   Rectal: deferred  Musc/Skeltl: no joint deformity, swelling, redness Extremities:  No CCE  Neurologic:  Oriented x 3.  No tremor.  No limb weakness.   Skin:  No rash, sores.  Small UE purpura.  Tanned.  Tattoos:  Small tatoo on right hand   Psych:  Pleasant, calm, cooperative.   Intake/Output from previous day: No intake/output data recorded. Intake/Output this shift: No intake/output data recorded.  LAB RESULTS: No results for input(s): WBC, HGB, HCT, PLT in the last 72 hours. BMET Lab Results  Component Value Date   NA 135 08/03/2016   NA 140 08/02/2016   NA 140 07/04/2016   K 4.5 08/03/2016   K 4.2 08/02/2016   K 4.1 07/04/2016   CL 103 08/03/2016   CL 104 08/02/2016   CL 105 07/04/2016   CO2 27 08/03/2016   CO2 28 08/10/2015   CO2 27 08/09/2015   GLUCOSE 104 (H) 08/03/2016   GLUCOSE 90 08/02/2016   GLUCOSE 100 (H) 07/04/2016   BUN 10 08/03/2016   BUN 18 08/02/2016   BUN 11 07/04/2016   CREATININE 0.73 08/03/2016   CREATININE 0.70 08/02/2016   CREATININE 0.80 07/04/2016   CALCIUM 8.8 (L) 08/03/2016   CALCIUM 9.0 08/10/2015   CALCIUM 9.1 08/09/2015   LFT No results for input(s): PROT, ALBUMIN, AST, ALT, ALKPHOS, BILITOT, BILIDIR, IBILI in the last 72 hours. PT/INR No results found for: INR, PROTIME Hepatitis Panel No results for input(s): HEPBSAG, HCVAB, HEPAIGM, HEPBIGM in the last 72 hours. C-Diff No components found for: CDIFF Lipase     Component Value Date/Time   LIPASE 25 07/19/2012 0812    Drugs of Abuse  No results found for: LABOPIA, COCAINSCRNUR, LABBENZ, AMPHETMU, THCU, LABBARB   RADIOLOGY STUDIES: Ct Soft Tissue Neck Wo Contrast  Result Date: 11/15/2016 CLINICAL DATA:  Dysphagia. Globus sensation. Feels like food is  stuck in his airway. EXAM: CT NECK WITHOUT CONTRAST TECHNIQUE: Multidetector CT imaging of the neck was performed following the standard protocol without intravenous contrast. COMPARISON:  CT neck 08/29/2015.  CTA of the neck/ 3/18 FINDINGS: Pharynx and larynx: No focal mucosal or submucosal lesions are present. The nasopharynx, oropharynx, and hypopharynx are clear. Vocal cords are midline and symmetric. The vocal cords are opposed. The upper esophagus is distended. There is fluid or soft tissue within the distended esophagus. Salivary glands: The right submandibular gland has been resected. The left submandibular vein is atrophic. Parotid glands are intact bilaterally. Thyroid: Normal Lymph nodes: The right radical neck dissection is again noted. No residual or recurrent right-sided lymph nodes are present. There is no significant adenopathy on the left. Vascular: A left carotid bifurcations stent is in place. Dense calcifications are present at the carotid bifurcations bilaterally. 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 Limited intracranial: Unremarkable Visualized orbits: The normal limits. Mastoids and visualized paranasal sinuses: The paranasal sinuses and mastoid air cells are clear. Skeleton: Anterior osteophytes are fused C5-6 and C6-7. There is fusion across the disc space and involving the posterior elements at C2-3 and C3-4. Severe osseous foraminal narrowing is present on the left C3-4 on the right at C4-5. Upper chest: Post radiation scarring at the lung apices is stable. Other: None. IMPRESSION: 1. The upper esophagus is distended. This may reflect a functional obstruction or significant dysmotility with marked stasis. This could certainly account for the patient's sensation of food being stuck. 2. No foreign body or focal lesion along the esophagus poor oropharynx. 3. Right radical neck dissection without evidence  for disease recurrence. 4. Left carotid stent. 5. Multilevel cervical  spondylosis. Electronically Signed   By: San Morelle M.D.   On: 11/15/2016 14:13   Ct Chest Wo Contrast  Result Date: 11/15/2016 CLINICAL DATA:  Dysphagia for the last few days. EXAM: CT CHEST WITHOUT CONTRAST TECHNIQUE: Multidetector CT imaging of the chest was performed following the standard protocol without IV contrast. COMPARISON:  08/29/2015 chest CT FINDINGS: Cardiovascular: Top-normal size cardiac chambers without pericardial effusion. Aortic atherosclerosis at the aortic root at the arch without aneurysm. Coronary arteriosclerosis is identified along the LAD. The unenhanced pulmonary arteries are unremarkable. Mediastinum/Nodes: Fluid-filled esophagus to the upper third with slight thickened appearance near the GE junction the without definite mass. Reflux is not excluded. Subtle stricturing is likewise not entirely excluded. This may benefit from fluoroscopic assessment with an upper GI study. Atrophic appearing thyroid gland. No enlarged axillary, mediastinal or hilar lymph nodes. Lungs/Pleura: No pneumothorax or effusion. Pleural-parenchymal scarring or post radiative change in the lung apices as before. No pneumonic consolidation. No dominant mass. Tiny 1-2 mm subpleural nodule in the right upper lobe series 6, image 64 stable. Upper Abdomen: No acute abnormality. Musculoskeletal: No chest wall mass or suspicious bone lesions identified. Mild degenerative changes along the dorsal spine with right central partially calcified disc at T9-10, unchanged in appearance. IMPRESSION: 1. Fluid-filled dilated esophagus noted to the proximal third. Reflux might account for this. No definite mass is apparent. Findings could be further correlated with an upper GI study over better assessment. 2. Coronary arteriosclerosis and aortic atherosclerosis. 3. No acute pulmonary abnormality. Tiny 1-2 mm subpleural nodule in the right upper lobe is stable. Electronically Signed   By: Ashley Royalty M.D.   On:  11/15/2016 14:14    IMPRESSION:   *  Dysphagia, food impaction likely.    *  Carotid artery stenosis, s/p stenting 07/2016.  On chronic Plavix.    *  S/p tonsillectomy, neck exploration and radiation for tonsillar cancer diagnosed 2003   PLAN:     *  EGD today.  Orders placed.    Azucena Freed  11/15/2016, 3:21 PM Pager: [phone]

## 2016-11-15 NOTE — Anesthesia Preprocedure Evaluation (Addendum)
Anesthesia Evaluation  Patient identified by MRN, date of birth, ID band Patient awake    Reviewed: Allergy & Precautions, NPO status , Patient's Chart, lab work & pertinent test results  Airway Mallampati: II  TM Distance: >3 FB Neck ROM: Full    Dental  (+) Edentulous Lower, Edentulous Upper, Dental Advisory Given   Pulmonary former smoker,    Pulmonary exam normal        Cardiovascular hypertension, + Peripheral Vascular Disease  Normal cardiovascular exam     Neuro/Psych negative neurological ROS  negative psych ROS   GI/Hepatic negative GI ROS, Neg liver ROS,   Endo/Other  Hypothyroidism   Renal/GU negative Renal ROS  negative genitourinary   Musculoskeletal negative musculoskeletal ROS (+)   Abdominal   Peds negative pediatric ROS (+)  Hematology negative hematology ROS (+)   Anesthesia Other Findings   Reproductive/Obstetrics negative OB ROS                            Anesthesia Physical Anesthesia Plan  ASA: III and emergent  Anesthesia Plan: General   Post-op Pain Management:    Induction: Intravenous, Rapid sequence and Cricoid pressure planned  Airway Management Planned: Oral ETT  Additional Equipment:   Intra-op Plan:   Post-operative Plan: Extubation in OR  Informed Consent: I have reviewed the patients History and Physical, chart, labs and discussed the procedure including the risks, benefits and alternatives for the proposed anesthesia with the patient or authorized representative who has indicated his/her understanding and acceptance.   Dental advisory given  Plan Discussed with: Anesthesiologist, CRNA and Surgeon  Anesthesia Plan Comments:        Anesthesia Quick Evaluation

## 2016-11-15 NOTE — Discharge Instructions (Signed)
YOU HAD AN ENDOSCOPIC PROCEDURE TODAY: Refer to the procedure report and other information in the discharge instructions given to you for any specific questions about what was found during the examination. If this information does not answer your questions, please call [facility] office at [phone] to clarify.  ° °YOU SHOULD EXPECT: Some feelings of bloating in the abdomen. Passage of more gas than usual. Walking can help get rid of the air that was put into your GI tract during the procedure and reduce the bloating. If you had a lower endoscopy (such as a colonoscopy or flexible sigmoidoscopy) you may notice spotting of blood in your stool or on the toilet paper. Some abdominal soreness may be present for a day or two, also. ° °DIET: Your first meal following the procedure should be a light meal and then it is ok to progress to your normal diet. A half-sandwich or bowl of soup is an example of a good first meal. Heavy or fried foods are harder to digest and may make you feel nauseous or bloated. Drink plenty of fluids but you should avoid alcoholic beverages for 24 hours. If you had a esophageal dilation, please see attached instructions for diet.   ° °ACTIVITY: Your care partner should take you home directly after the procedure. You should plan to take it easy, moving slowly for the rest of the day. You can resume normal activity the day after the procedure however YOU SHOULD NOT DRIVE, use power tools, machinery or perform tasks that involve climbing or major physical exertion for 24 hours (because of the sedation medicines used during the test).  ° °SYMPTOMS TO REPORT IMMEDIATELY: °A gastroenterologist can be reached at any hour. Please call [phone]  for any of the following symptoms:  °Following lower endoscopy (colonoscopy, flexible sigmoidoscopy) °Excessive amounts of blood in the stool  °Significant tenderness, worsening of abdominal pains  °Swelling of the abdomen that is new, acute  °Fever of 100° or  higher  °Following upper endoscopy (EGD, EUS, ERCP, esophageal dilation) °Vomiting of blood or coffee ground material  °New, significant abdominal pain  °New, significant chest pain or pain under the shoulder blades  °Painful or persistently difficult swallowing  °New shortness of breath  °Black, tarry-looking or red, bloody stools ° °FOLLOW UP:  °If any biopsies were taken you will be contacted by phone or by letter within the next 1-3 weeks. Call [phone]  if you have not heard about the biopsies in 3 weeks.  °Please also call with any specific questions about appointments or follow up tests. ° °

## 2016-11-16 NOTE — Anesthesia Postprocedure Evaluation (Signed)
Anesthesia Post Note  Patient: Alexander Duran  Procedure(s) Performed: Procedure(s) (LRB): ESOPHAGOGASTRODUODENOSCOPY (EGD) (N/A) FOREIGN BODY REMOVAL (N/A)     Patient location during evaluation: PACU Anesthesia Type: General Level of consciousness: awake and alert Pain management: pain level controlled Vital Signs Assessment: post-procedure vital signs reviewed and stable Respiratory status: spontaneous breathing, nonlabored ventilation, respiratory function stable and patient connected to nasal cannula oxygen Cardiovascular status: blood pressure returned to baseline and stable Postop Assessment: no signs of nausea or vomiting Anesthetic complications: no    Last Vitals:  Vitals:   11/15/16 1730 11/15/16 1745  BP: (!) 133/58 (!) 140/55  Pulse: 85 81  Resp: 10 18  Temp:      Last Pain:  Vitals:   11/15/16 1745  TempSrc:   PainSc: [place]

## 2016-11-17 ENCOUNTER — Encounter (HOSPITAL_COMMUNITY): Payer: Self-pay | Admitting: Gastroenterology

## 2017-02-13 ENCOUNTER — Other Ambulatory Visit: Payer: Self-pay

## 2017-02-18 ENCOUNTER — Telehealth: Payer: Self-pay

## 2017-02-20 ENCOUNTER — Encounter (HOSPITAL_COMMUNITY): Payer: No Typology Code available for payment source

## 2017-02-20 ENCOUNTER — Ambulatory Visit: Payer: Non-veteran care | Admitting: Family

## 2017-02-25 ENCOUNTER — Encounter: Payer: Self-pay | Admitting: Family

## 2017-02-27 ENCOUNTER — Ambulatory Visit (INDEPENDENT_AMBULATORY_CARE_PROVIDER_SITE_OTHER): Payer: Non-veteran care | Admitting: Family

## 2017-02-27 ENCOUNTER — Ambulatory Visit (HOSPITAL_COMMUNITY)
Admission: RE | Admit: 2017-02-27 | Discharge: 2017-02-27 | Disposition: A | Discharge status: Home / Self Care | Payer: Non-veteran care | Source: Ambulatory Visit | Attending: Vascular Surgery | Admitting: Vascular Surgery

## 2017-02-27 ENCOUNTER — Encounter: Payer: Self-pay | Admitting: Family

## 2017-02-27 VITALS — BP 127/78 | HR 64 | Temp 97.2°F | Resp 18 | Ht 68.0 in | Wt 179.0 lb

## 2017-02-27 DIAGNOSIS — Z95828 Presence of other vascular implants and grafts: Secondary | ICD-10-CM | POA: Diagnosis not present

## 2017-02-27 DIAGNOSIS — Z9889 Other specified postprocedural states: Secondary | ICD-10-CM | POA: Diagnosis not present

## 2017-02-27 DIAGNOSIS — I6522 Occlusion and stenosis of left carotid artery: Secondary | ICD-10-CM | POA: Diagnosis not present

## 2017-02-27 DIAGNOSIS — Z959 Presence of cardiac and vascular implant and graft, unspecified: Secondary | ICD-10-CM | POA: Diagnosis not present

## 2017-02-27 DIAGNOSIS — I6521 Occlusion and stenosis of right carotid artery: Secondary | ICD-10-CM

## 2017-02-27 DIAGNOSIS — C099 Malignant neoplasm of tonsil, unspecified: Secondary | ICD-10-CM

## 2017-02-27 DIAGNOSIS — I6523 Occlusion and stenosis of bilateral carotid arteries: Secondary | ICD-10-CM | POA: Diagnosis not present

## 2017-02-27 LAB — VAS US CAROTID
LCCAPDIAS: 29 cm/s
LCCAPSYS: 104 cm/s
LEFT ECA DIAS: 20 cm/s
Left CCA dist dias: 29 cm/s
Left CCA dist sys: 92 cm/s
Left ICA dist dias: -68 cm/s
Left ICA dist sys: -183 cm/s
Left ICA prox dias: 35 cm/s
Left ICA prox sys: 116 cm/s
RCCAPDIAS: 14 cm/s
RIGHT CCA MID DIAS: 11 cm/s
RIGHT ECA DIAS: -20 cm/s
Right CCA prox sys: 114 cm/s

## 2017-02-27 NOTE — Progress Notes (Signed)
Chief Complaint: Follow up Extracranial Carotid Artery Stenosis   History of Present Illness  Alexander Duran is a 71 y.o. male who is s/p left carotid angioplasty and stenting on 08-02-16 by Dr. Oneida Alar.  He was last evaluated on 08-15-16 by K. Trinh, PA-C, for one week history of right groin "mass." The size has been unchanged. The patient is s/p left carotid artery stenting on 08/02/16 for asymptomatic left carotid stenosis by Dr. Oneida Alar. He has a right carotid occlusion. Post-operatively, the patient did have some bleeding with his right groin sheath site. This was controlled with manual pressure. He was neuro intact. The patient had no further bleeding by POD 1. His groin was soft with mild ecchymosis. He was discharged home on POD 1.  There was no evidence of pseudoaneurysm on 08-15-16 duplex study. On physical exam, there was a small hematoma of the right groin without pulsatility. His ecchymosis was improving. Kim discussed with pt that his symptoms will improve with time.  He had been asymptomatic without TIA or stroke symptoms. Dr. Oneida Alar also saw the patient and reviewed his post-op carotid duplex. The patient will continue Plavix and ASA indefinitely. He was to follow up in 6 months with a carotid duplex.  Pt states he no longer has any mass in his right groin.   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  He sates that he takes hydrocodone for pain.  He states he does not get constipated, eats a high fiber diet.   He had tonsillar cancer, had a radical neck dissection in 2003 and radiation to both sides of neck.  He has had some difficulty with esophogeal strictures, sees a gastroenterologist for this.    He exercises regularly using his BowFlex, and mows lawns for extra money.   Pt Diabetic: no Pt smoker: former smoker, quit in 2002, smoked x 25 years  Pt meds include: Statin : no ASA: yes Other  anticoagulants/antiplatelets: Plavix    Past Medical History:  Diagnosis Date  . Cancer ([place])    Tonsil cancer  . Carotid artery stenosis without cerebral infarction 08/30/2015  . Hypertension   . Hypothyroidism 05/09/2014  . Nausea in adult 08/10/2015  . Other headache syndrome 08/10/2015  . Thyroid disease   . Weight loss 08/08/2015    Social History Social History  Substance Use Topics  . Smoking status: Former Smoker    Packs/day: 1.00    Years: 25.00    Quit date: 05/04/2001  . Smokeless tobacco: Never Used  . Alcohol use 1.2 oz/week    2 Shots of liquor per week     Comment: rarely    Family History Family History  Problem Relation Age of Onset  . Cancer Father        lung cancer  . COPD Mother   . Macular degeneration Mother     Surgical History Past Surgical History:  Procedure Laterality Date  . BACK SURGERY    . CAROTID PTA/STENT INTERVENTION Left 08/02/2016   Procedure: Carotid PTA/Stent Intervention;  Surgeon: Elam Dutch, MD;  Location: [facility];  Service: Cardiovascular;  Laterality: Left;  . ESOPHAGOGASTRODUODENOSCOPY N/A 11/15/2016   Procedure: ESOPHAGOGASTRODUODENOSCOPY (EGD);  Surgeon: Doran Stabler, MD;  Location: [facility] ENDOSCOPY;  Service: Endoscopy;  Laterality: N/A;  . FOREIGN BODY REMOVAL N/A 11/15/2016   Procedure: FOREIGN BODY REMOVAL;  Surgeon: Doran Stabler, MD;  Location: [facility]  ENDOSCOPY;  Service: Endoscopy;  Laterality: N/A;  . PERIPHERAL VASCULAR CATHETERIZATION N/A 07/04/2016   Procedure: Aortic Arch Angiography;  Surgeon: Conrad Wickenburg, MD;  Location: [facility];  Service: Cardiovascular;  Laterality: N/A;  . PERIPHERAL VASCULAR CATHETERIZATION N/A 07/04/2016   Procedure: Carotid & cerebral  Angiography;  Surgeon: Conrad Ludowici, MD;  Location: [facility];  Service: Cardiovascular;  Laterality: N/A;  . TUMOR REMOVAL     in neck    No Known Allergies  Current Outpatient Prescriptions  Medication Sig  Dispense Refill  . amLODipine (NORVASC) 10 MG tablet Take 1 tablet (10 mg total) by mouth daily. 30 tablet 6  . aspirin 81 MG chewable tablet Chew 81 mg by mouth every evening.     . clopidogrel (PLAVIX) 75 MG tablet Take 1 tablet (75 mg total) by mouth daily. 30 tablet 6  . DULoxetine (CYMBALTA) 30 MG capsule Take 30 mg by mouth daily at 12 noon.     Marland Kitchen HYDROcodone-acetaminophen (NORCO/VICODIN) 5-325 MG tablet Take 0.5-1 tablets by mouth daily as needed for moderate pain.     Marland Kitchen levothyroxine (SYNTHROID, LEVOTHROID) 75 MCG tablet Take 75 mcg by mouth daily before breakfast.     . lisinopril (PRINIVIL,ZESTRIL) 5 MG tablet Take 1 tablet (5 mg total) by mouth daily. (Patient taking differently: Take 5 mg by mouth every evening. ) 30 tablet 1  . Polyethyl Glycol-Propyl Glycol (SYSTANE OP) Place 1 drop into both eyes daily.      No current facility-administered medications for this visit.     Review of Systems : See HPI for pertinent positives and negatives.  Physical Examination  Vitals:   02/27/17 0945 02/27/17 0946  BP: (!) 146/83 127/78  Pulse: 64   Resp: 18   Temp: (!) 97.2 F (36.2 C)   TempSrc: Oral   SpO2: 97%   Weight: 179 lb (81.2 kg)   Height: 5\' 8"  (1.727 m)    Body mass index is 27.22 kg/m.  General: WDWN male in NAD GAIT: normal Eyes: PERRLA Pulmonary:  Respirations are non-labored, good air movement, CTAB, no rales, rhonchi, or wheezing.  Cardiac: regular rhythm, no detected murmur.  VASCULAR EXAM Carotid Bruits Right Left   Negative Positive     Abdominal aortic pulse is not palpable. Radial pulses are 2+ palpable and equal.                                                                                                                            LE Pulses Right Left       POPLITEAL  not palpable   not palpable       POSTERIOR TIBIAL  not palpable   2+ palpable        DORSALIS PEDIS      ANTERIOR TIBIAL 2+ palpable  not palpable     Gastrointestinal:  soft, nontender, BS WNL, no r/g, no palpable masses.  Musculoskeletal: No muscle atrophy/wasting.  M/S 5/5 throughout, extremities without ischemic changes.   Neurologic:  A&O X 3; appropriate affect, sensation is normal; speech is normal, CN 2-12 intact except has slightly asymmetric smile, tongue deviates to the left, pain and light touch intact in extremities, motor exam as listed above.    Assessment: Alexander Duran is a 71 y.o. male who is s/p left carotid angioplasty and stenting on 08-02-16.  He has a known right ICA occlusion.  He has no hx of stroke or TIA.  He had a radical neck dissection in 2003 for tonsillar cancer, follow by radiation to both sides of his neck.  He has had some difficulty with esophogeal strictures, sees a gastroenterologist for this.     Fortunately he does not have DM and quit smoking in 2002 (smoked x 25 years).  He stays physically active.   He is not on a statin. Consider adding a statin for CVD event risk reduction, if there are no contraindications for him, defer to pt's PCP.   DATA Carotid Duplex (02-27-17): Right ICA: Known occlusion. Left ICA: Patent stent of the left ICA with no in stent stenosis. Velocities in the distal levt native ICA suggest 60-79% stenosis, however, the distal segment appears tortuous.  Right vertebral artery flow is antegrade/dampened, left is antegrade. Bilateral subclavian artery waveforms are normal.  No significant change compared to the exam on 08-09-16.    Plan: Follow-up in 6 months with Carotid Duplex scan.   I discussed in depth with the patient the nature of atherosclerosis, and emphasized the importance of maximal medical management including strict control of blood pressure, blood glucose, and lipid levels, obtaining regular exercise, and continued cessation of smoking.  The patient is aware that without maximal medical management the underlying atherosclerotic disease process will progress, limiting the  benefit of any interventions. The patient was given information about stroke prevention and what symptoms should prompt the patient to seek immediate medical care. Thank you for allowing [place] to participate in this patient's care.  Clemon Chambers, RN, MSN, FNP-C [facility] Office: [phone]  Clinic Physician: [facility]  02/27/17 10:08 AM

## 2017-02-27 NOTE — Patient Instructions (Signed)
Stroke Prevention Some medical conditions and behaviors are associated with an increased chance of having a stroke. You may prevent a stroke by making healthy choices and managing medical conditions. How can I reduce my risk of having a stroke?  Stay physically active. Get at least 30 minutes of activity on most or all days.  Do not smoke. It may also be helpful to avoid exposure to secondhand smoke.  Limit alcohol use. Moderate alcohol use is considered to be:  No more than 2 drinks per day for men.  No more than 1 drink per day for nonpregnant women.  Eat healthy foods. This involves:  Eating 5 or more servings of fruits and vegetables a day.  Making dietary changes that address high blood pressure (hypertension), high cholesterol, diabetes, or obesity.  Manage your cholesterol levels.  Making food choices that are high in fiber and low in saturated fat, trans fat, and cholesterol may control cholesterol levels.  Take any prescribed medicines to control cholesterol as directed by your health care provider.  Manage your diabetes.  Controlling your carbohydrate and sugar intake is recommended to manage diabetes.  Take any prescribed medicines to control diabetes as directed by your health care provider.  Control your hypertension.  Making food choices that are low in salt (sodium), saturated fat, trans fat, and cholesterol is recommended to manage hypertension.  Ask your health care provider if you need treatment to lower your blood pressure. Take any prescribed medicines to control hypertension as directed by your health care provider.  If you are 18-39 years of age, have your blood pressure checked every 3-5 years. If you are 40 years of age or older, have your blood pressure checked every year.  Maintain a healthy weight.  Reducing calorie intake and making food choices that are low in sodium, saturated fat, trans fat, and cholesterol are recommended to manage  weight.  Stop drug abuse.  Avoid taking birth control pills.  Talk to your health care provider about the risks of taking birth control pills if you are over 35 years old, smoke, get migraines, or have ever had a blood clot.  Get evaluated for sleep disorders (sleep apnea).  Talk to your health care provider about getting a sleep evaluation if you snore a lot or have excessive sleepiness.  Take medicines only as directed by your health care provider.  For some people, aspirin or blood thinners (anticoagulants) are helpful in reducing the risk of forming abnormal blood clots that can lead to stroke. If you have the irregular heart rhythm of atrial fibrillation, you should be on a blood thinner unless there is a good reason you cannot take them.  Understand all your medicine instructions.  Make sure that other conditions (such as anemia or atherosclerosis) are addressed. Get help right away if:  You have sudden weakness or numbness of the face, arm, or leg, especially on one side of the body.  Your face or eyelid droops to one side.  You have sudden confusion.  You have trouble speaking (aphasia) or understanding.  You have sudden trouble seeing in one or both eyes.  You have sudden trouble walking.  You have dizziness.  You have a loss of balance or coordination.  You have a sudden, severe headache with no known cause.  You have new chest pain or an irregular heartbeat. Any of these symptoms may represent a serious problem that is an emergency. Do not wait to see if the symptoms will go away.   Get medical help at once. Call your local emergency services (911 in [place] Do not drive yourself to the hospital. This information is not intended to replace advice given to you by your health care provider. Make sure you discuss any questions you have with your health care provider. Document Released: 07/11/2004 Document Revised: 11/09/2015 Document Reviewed: 12/04/2012 Elsevier  Interactive Patient Education  2017 [employer]  

## 2017-03-13 NOTE — Addendum Note (Signed)
Addended by: Tamas Suen A on: 03/13/2017 02:52 PM   Modules accepted: Orders  

## 2017-04-21 NOTE — Telephone Encounter (Signed)
Entered in error

## 2017-07-04 IMAGING — US US RENAL
1 series · 14 of 25 positions shown · non-contrast
Comparison: CT scan of the abdomen and pelvis of [DATE]

CLINICAL DATA: Follow-up left renal cysts.

EXAM:
RENAL / URINARY TRACT ULTRASOUND COMPLETE

[Series 1: us renal · 0.22mm/px · 14 of 44 slices shown]
[im 1/44]
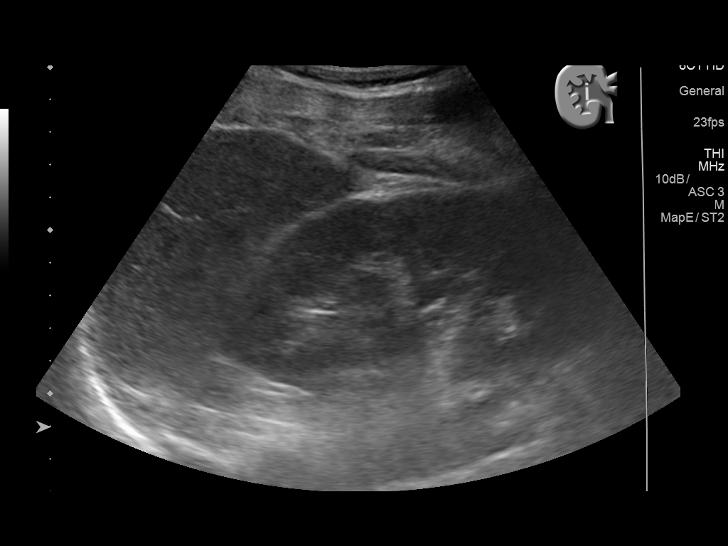
[im 4/44]
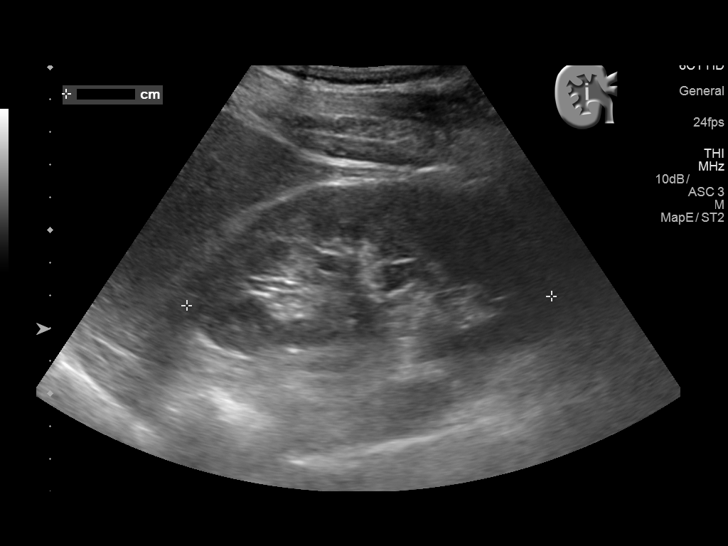
[im 8/44]
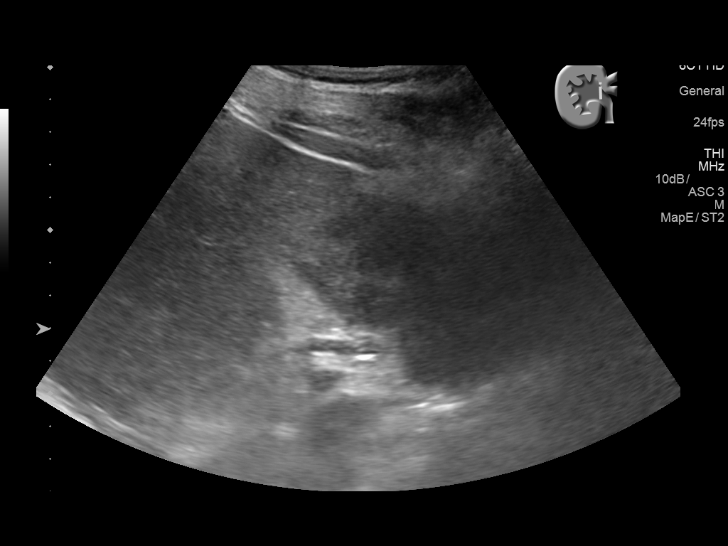
[im 11/44]
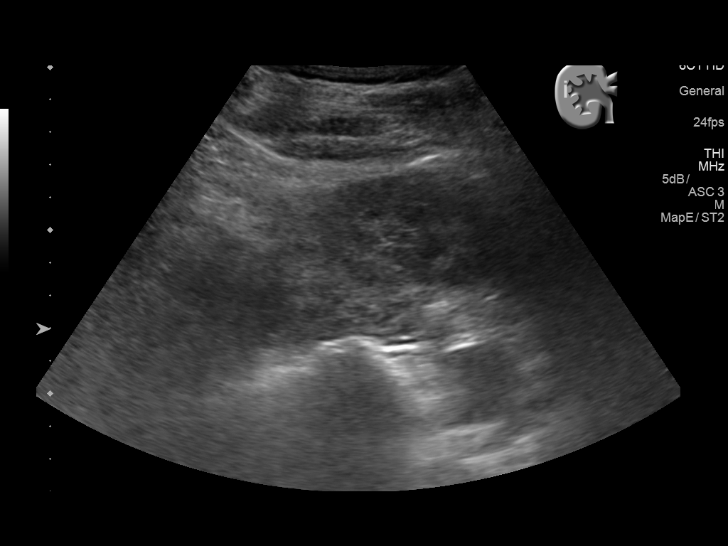
[im 15/44]
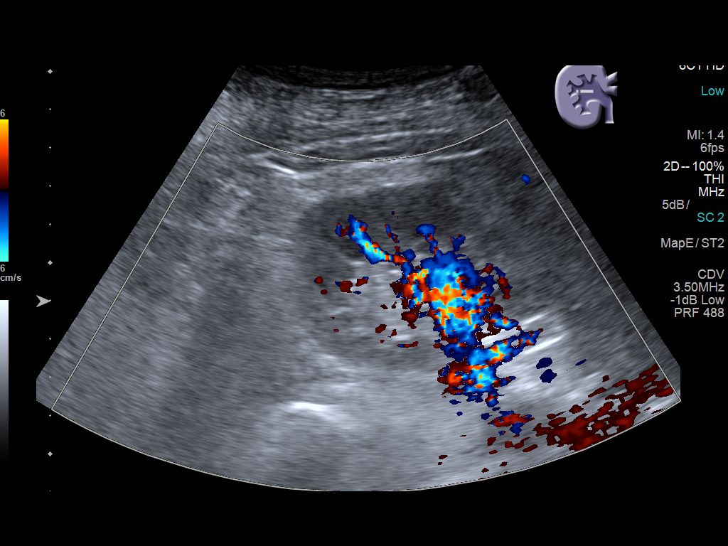
[im 17/44]
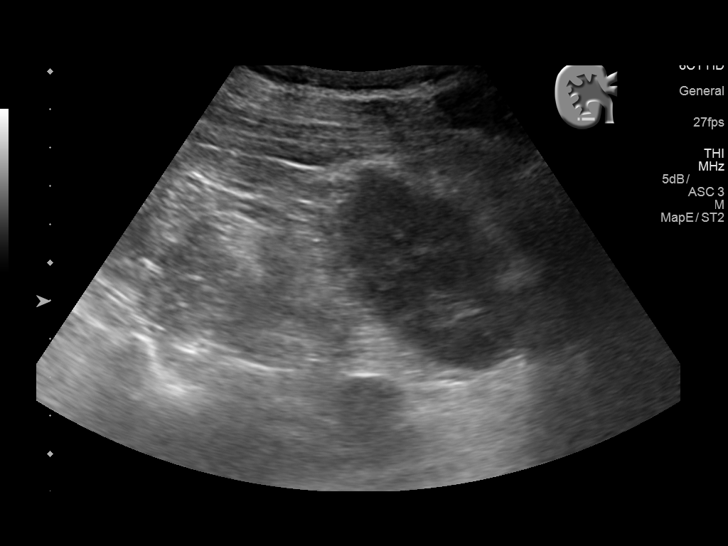
[im 20/44]
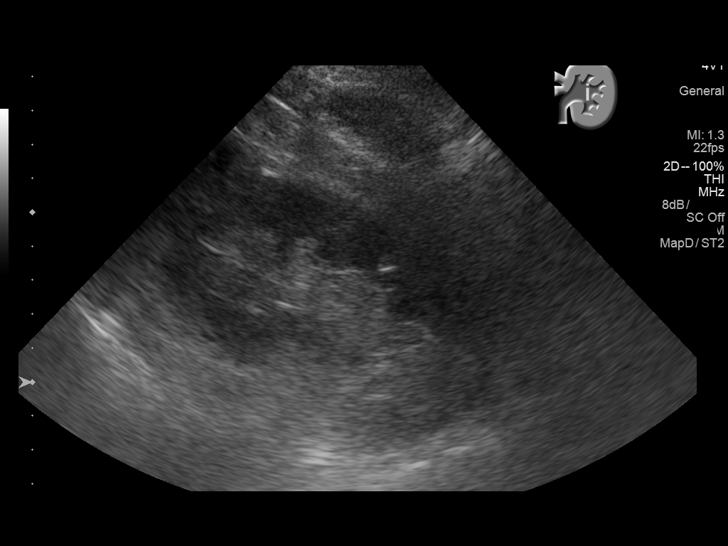
[im 24/44]
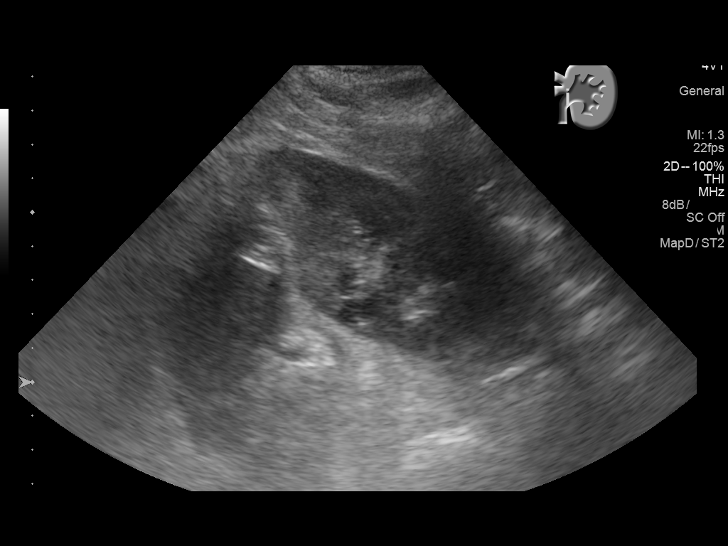
[im 27/44]
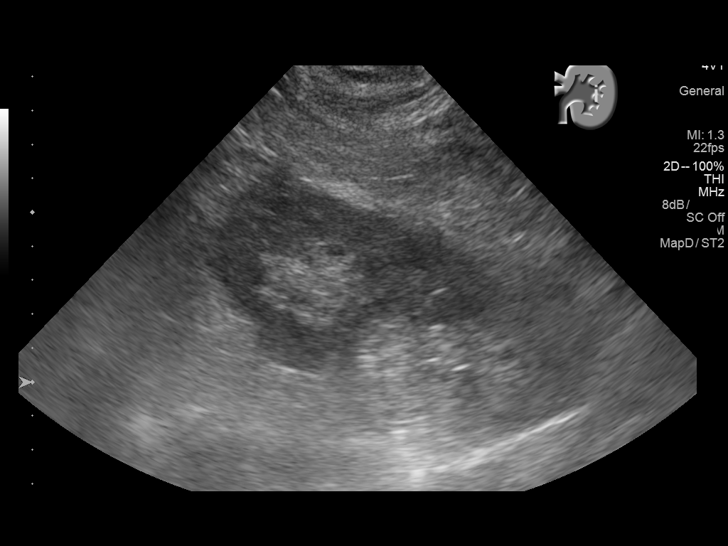
[im 29/44]
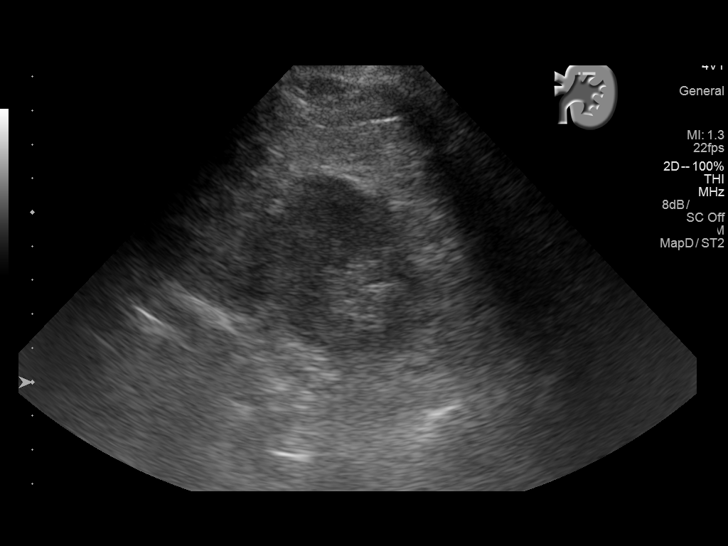
[im 33/44]
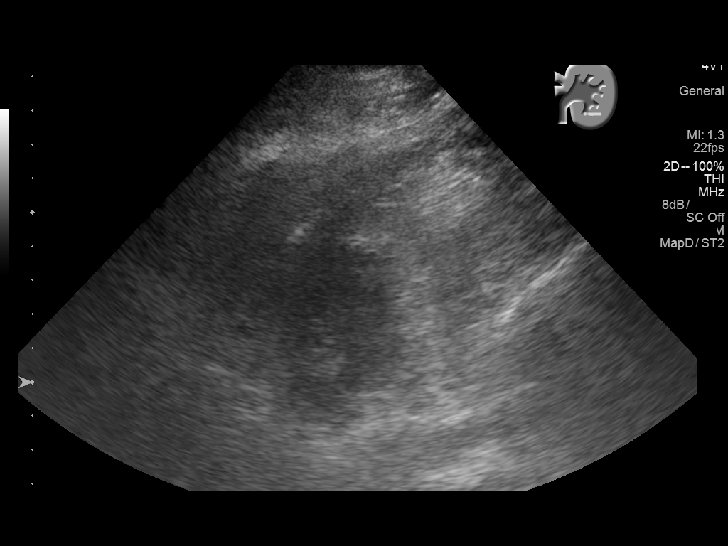
[im 36/44]
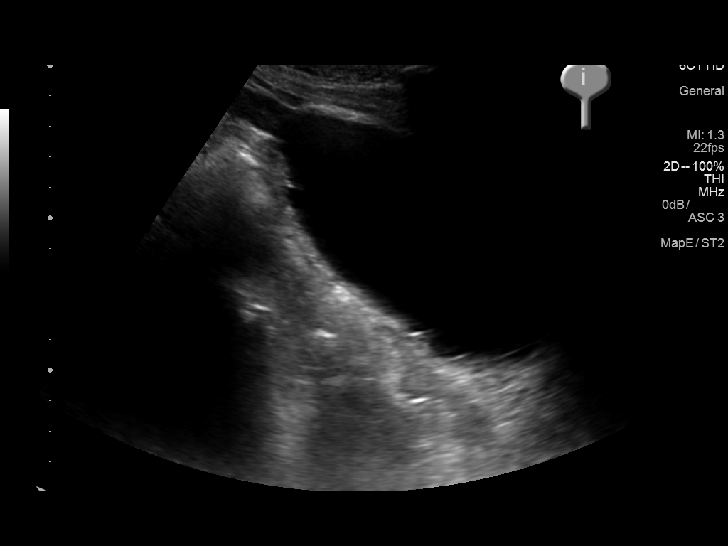
[im 40/44]
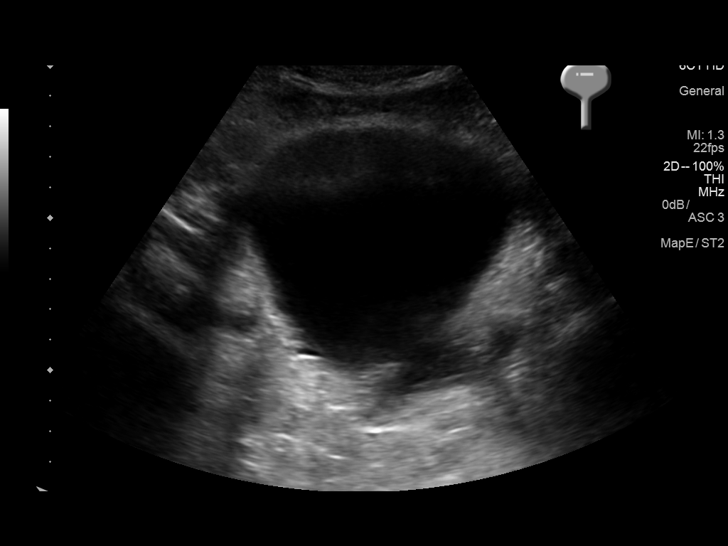
[im 44/44]
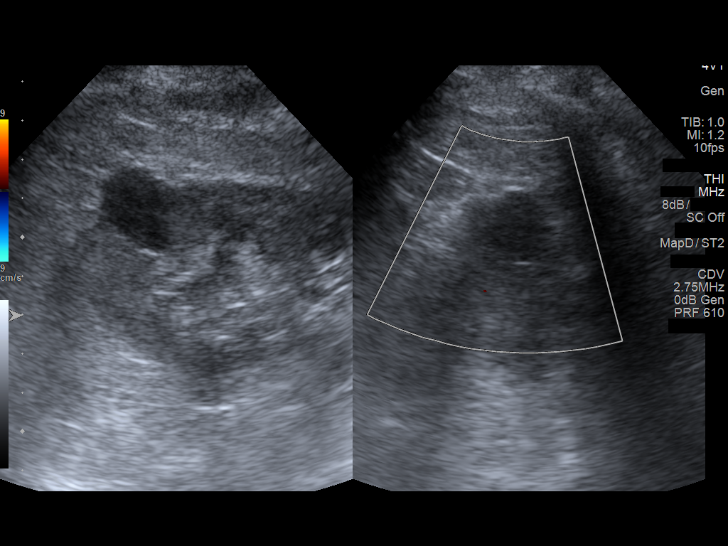

[14 of 25 positions shown; findings below may reference images not displayed]

FINDINGS: Right Kidney:

Length: 11 point 2 cm. The renal cortical echotexture remains lower
than that of the adjacent liver. There is no hydronephrosis.

Left Kidney:

Length: 11.1 cm. There is an upper pole cyst measuring 2.5 x 1.5 x
1.9 cm.

Bladder:

Normal in appearance.
IMPRESSION: Simple appearing upper pole left renal cyst. A previously described
interpolar cyst is not observed today. Normal appearing right
kidney. No hydronephrosis.

## 2017-08-28 ENCOUNTER — Ambulatory Visit: Payer: Non-veteran care | Admitting: Family

## 2017-08-28 ENCOUNTER — Encounter (HOSPITAL_COMMUNITY): Payer: Non-veteran care

## 2017-11-21 ENCOUNTER — Other Ambulatory Visit: Payer: Self-pay

## 2017-11-21 ENCOUNTER — Emergency Department (HOSPITAL_COMMUNITY): Payer: No Typology Code available for payment source

## 2017-11-21 ENCOUNTER — Observation Stay (HOSPITAL_COMMUNITY)
Admission: EM | Admit: 2017-11-21 | Discharge: 2017-11-22 | Disposition: A | Discharge status: Home / Self Care | Payer: No Typology Code available for payment source | Attending: Family Medicine | Admitting: Family Medicine

## 2017-11-21 ENCOUNTER — Encounter (HOSPITAL_COMMUNITY): Payer: Self-pay

## 2017-11-21 DIAGNOSIS — M25512 Pain in left shoulder: Secondary | ICD-10-CM

## 2017-11-21 DIAGNOSIS — E039 Hypothyroidism, unspecified: Secondary | ICD-10-CM | POA: Diagnosis not present

## 2017-11-21 DIAGNOSIS — E038 Other specified hypothyroidism: Secondary | ICD-10-CM

## 2017-11-21 DIAGNOSIS — Z7982 Long term (current) use of aspirin: Secondary | ICD-10-CM | POA: Diagnosis not present

## 2017-11-21 DIAGNOSIS — E871 Hypo-osmolality and hyponatremia: Secondary | ICD-10-CM | POA: Diagnosis not present

## 2017-11-21 DIAGNOSIS — R61 Generalized hyperhidrosis: Secondary | ICD-10-CM | POA: Diagnosis not present

## 2017-11-21 DIAGNOSIS — I10 Essential (primary) hypertension: Secondary | ICD-10-CM | POA: Diagnosis not present

## 2017-11-21 DIAGNOSIS — Z79899 Other long term (current) drug therapy: Secondary | ICD-10-CM | POA: Insufficient documentation

## 2017-11-21 DIAGNOSIS — R079 Chest pain, unspecified: Secondary | ICD-10-CM | POA: Insufficient documentation

## 2017-11-21 DIAGNOSIS — Z87891 Personal history of nicotine dependence: Secondary | ICD-10-CM | POA: Insufficient documentation

## 2017-11-21 DIAGNOSIS — M25519 Pain in unspecified shoulder: Secondary | ICD-10-CM | POA: Diagnosis present

## 2017-11-21 DIAGNOSIS — C099 Malignant neoplasm of tonsil, unspecified: Secondary | ICD-10-CM | POA: Insufficient documentation

## 2017-11-21 LAB — BASIC METABOLIC PANEL
Anion gap: 9 (ref 5–15)
BUN: 9 mg/dL (ref 6–20)
CALCIUM: 9.5 mg/dL (ref 8.9–10.3)
CO2: 27 mmol/L (ref 22–32)
CREATININE: 0.62 mg/dL (ref 0.61–1.24)
Chloride: 96 mmol/L — ABNORMAL LOW (ref 101–111)
GFR calc Af Amer: >60 mL/min (ref >60)
GFR calc non Af Amer: >60 mL/min (ref >60)
GLUCOSE: 117 mg/dL — AB (ref 65–99)
Potassium: 4.3 mmol/L (ref 3.5–5.1)
Sodium: 132 mmol/L — ABNORMAL LOW (ref 135–145)

## 2017-11-21 LAB — CBC
HCT: 44.2 % (ref 39.0–52.0)
Hemoglobin: 15.4 g/dL (ref 13.0–17.0)
MCH: 33 pg (ref 26.0–34.0)
MCHC: 34.8 g/dL (ref 30.0–36.0)
MCV: 94.6 fL (ref 78.0–100.0)
PLATELETS: 273 10*3/uL (ref 150–400)
RBC: 4.67 MIL/uL (ref 4.22–5.81)
RDW: 13.4 % (ref 11.5–15.5)
WBC: 4.7 10*3/uL (ref 4.0–10.5)

## 2017-11-21 LAB — I-STAT TROPONIN, ED: Troponin i, poc: 0 ng/mL (ref 0.00–0.08)

## 2017-11-21 LAB — D-DIMER, QUANTITATIVE: D-Dimer, Quant: 0.45 ug/mL-FEU (ref 0.00–0.50)

## 2017-11-21 IMAGING — CR DG CHEST 2V
3 series · 3 of 3 positions shown · non-contrast
Comparison: CT chest [DATE].

CLINICAL DATA: Left shoulder pain.  Sweating.

EXAM:
CHEST - 2 VIEW

[chest lat (1 of 2)]
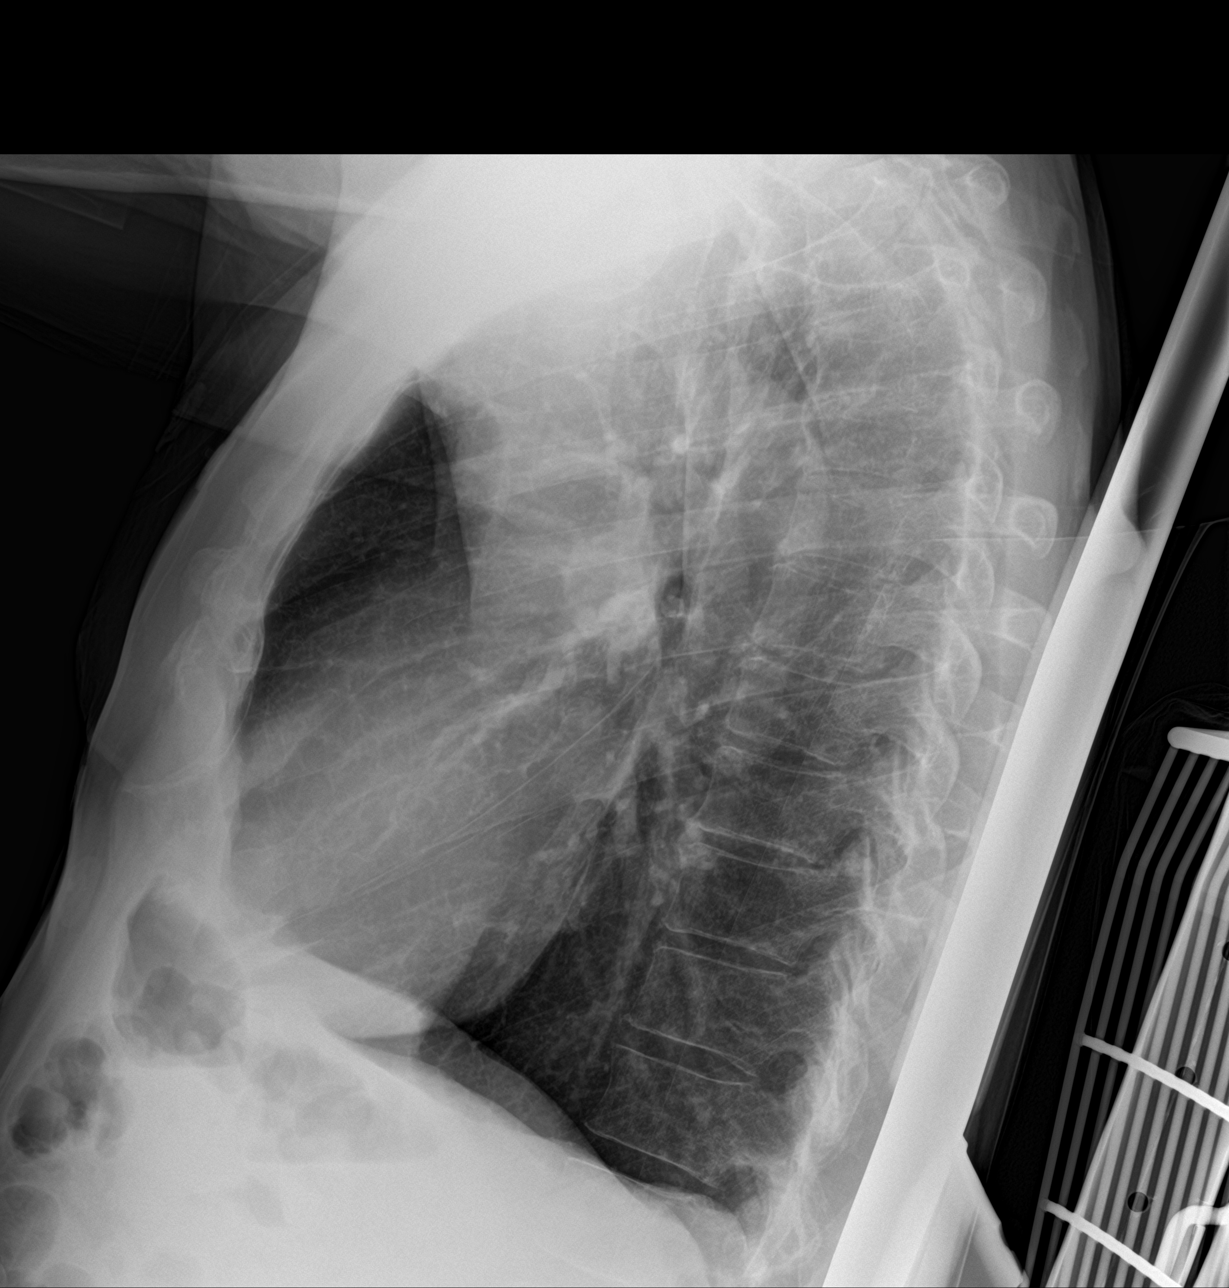

[chest ap]
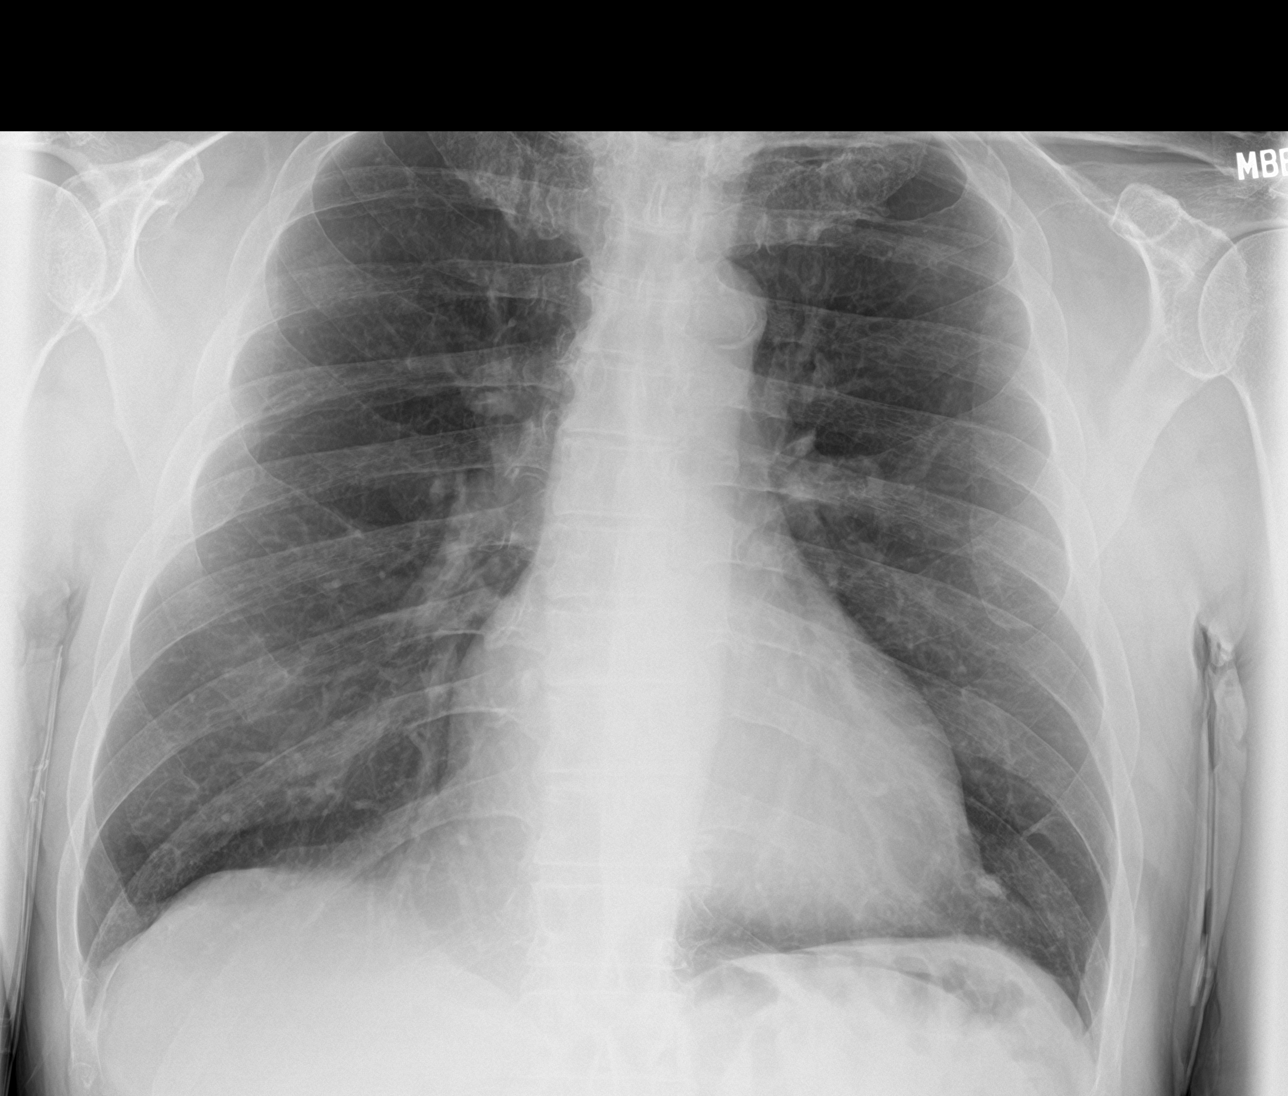

[chest lat (2 of 2)]
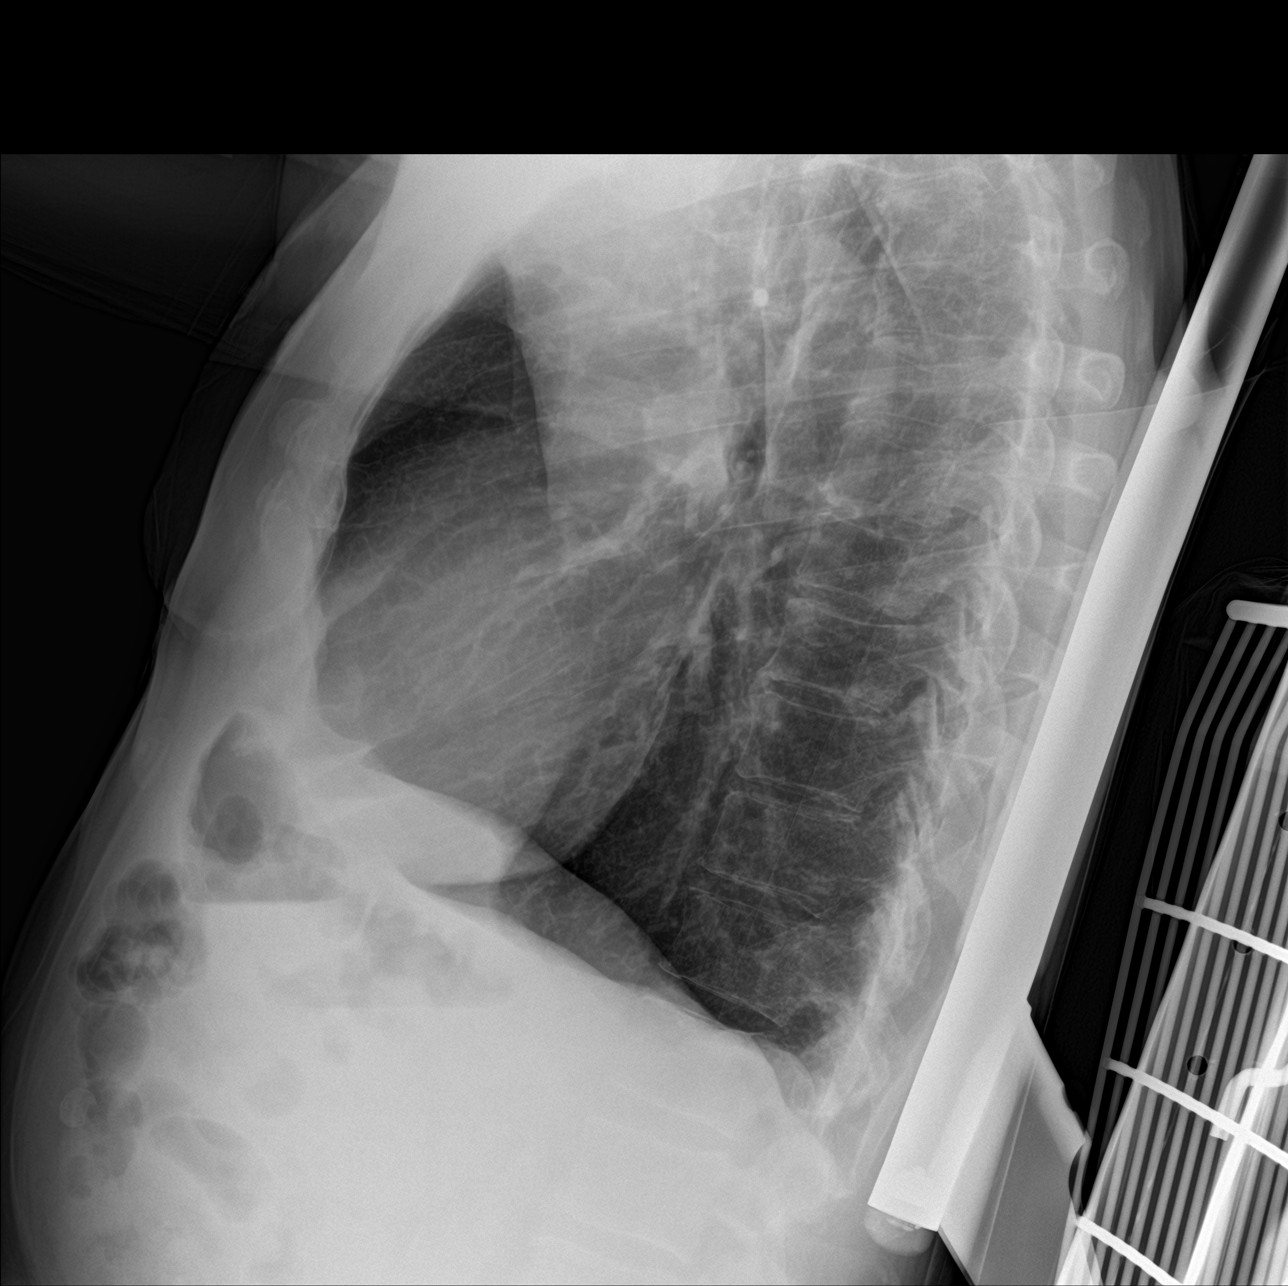

[3 of 3 positions shown; findings below may reference images not displayed]

FINDINGS: Heart size is normal. The lungs are hyperexpanded. There is no edema
or effusion. No focal airspace disease is present. The visualized
soft tissues and bony thorax are unremarkable.
IMPRESSION: No active cardiopulmonary disease.

## 2017-11-21 MED ORDER — MORPHINE SULFATE (PF) 4 MG/ML IV SOLN: 2.0000 mg | INTRAVENOUS | Status: DC | PRN

## 2017-11-21 MED ORDER — THIAMINE HCL 100 MG/ML IJ SOLN
100.0000 mg | Freq: Every day | INTRAMUSCULAR | Status: DC
  Filled 2017-11-21: qty 2

## 2017-11-21 MED ORDER — ENOXAPARIN SODIUM 40 MG/0.4ML ~~LOC~~ SOLN
40.0000 mg | Freq: Every day | SUBCUTANEOUS | Status: DC
  Filled 2017-11-21: qty 0.4

## 2017-11-21 MED ORDER — FOLIC ACID 1 MG PO TABS
1.0000 mg | ORAL_TABLET | Freq: Every day | ORAL | Status: DC
  Administered 2017-11-22: 1 mg via ORAL
  Filled 2017-11-21: qty 1

## 2017-11-21 MED ORDER — ADULT MULTIVITAMIN W/MINERALS CH
1.0000 | ORAL_TABLET | Freq: Every day | ORAL | Status: DC
  Administered 2017-11-22: 1 via ORAL
  Filled 2017-11-21: qty 1

## 2017-11-21 MED ORDER — DULOXETINE HCL 30 MG PO CPEP
30.0000 mg | ORAL_CAPSULE | Freq: Every day | ORAL | Status: DC
  Administered 2017-11-22: 30 mg via ORAL
  Filled 2017-11-21: qty 1

## 2017-11-21 MED ORDER — LEVOTHYROXINE SODIUM 75 MCG PO TABS
75.0000 ug | ORAL_TABLET | Freq: Every day | ORAL | Status: DC
  Administered 2017-11-22: 75 ug via ORAL
  Filled 2017-11-21: qty 1

## 2017-11-21 MED ORDER — DICLOFENAC SODIUM 1 % TD GEL: 2.0000 g | Freq: Four times a day (QID) | TRANSDERMAL | Status: DC | PRN

## 2017-11-21 MED ORDER — CLOPIDOGREL BISULFATE 75 MG PO TABS
75.0000 mg | ORAL_TABLET | Freq: Every day | ORAL | Status: DC
  Administered 2017-11-22: 75 mg via ORAL
  Filled 2017-11-21: qty 1

## 2017-11-21 MED ORDER — ASPIRIN 81 MG PO CHEW: 81.0000 mg | CHEWABLE_TABLET | Freq: Every evening | ORAL | Status: DC

## 2017-11-21 MED ORDER — LISINOPRIL 5 MG PO TABS
5.0000 mg | ORAL_TABLET | Freq: Every evening | ORAL | Status: DC
  Administered 2017-11-22: 5 mg via ORAL
  Filled 2017-11-21: qty 1

## 2017-11-21 MED ORDER — VITAMIN B-1 100 MG PO TABS
100.0000 mg | ORAL_TABLET | Freq: Every day | ORAL | Status: DC
  Administered 2017-11-22: 100 mg via ORAL
  Filled 2017-11-21: qty 1

## 2017-11-21 MED ORDER — LORAZEPAM 1 MG PO TABS: 1.0000 mg | ORAL_TABLET | Freq: Four times a day (QID) | ORAL | Status: DC | PRN

## 2017-11-21 MED ORDER — LORAZEPAM 2 MG/ML IJ SOLN: 1.0000 mg | Freq: Four times a day (QID) | INTRAMUSCULAR | Status: DC | PRN

## 2017-11-21 MED ORDER — GI COCKTAIL ~~LOC~~
30.0000 mL | Freq: Four times a day (QID) | ORAL | Status: DC | PRN
Start: 2017-11-21 — End: 2017-11-22

## 2017-11-21 MED ORDER — ASPIRIN 81 MG PO CHEW
324.0000 mg | CHEWABLE_TABLET | Freq: Once | ORAL | Status: AC
  Administered 2017-11-21: 324 mg via ORAL
  Filled 2017-11-21: qty 4

## 2017-11-21 MED ORDER — AMLODIPINE BESYLATE 10 MG PO TABS
10.0000 mg | ORAL_TABLET | Freq: Every day | ORAL | Status: DC
  Administered 2017-11-22: 10 mg via ORAL
  Filled 2017-11-21: qty 1

## 2017-11-21 MED ORDER — ONDANSETRON HCL 4 MG/2ML IJ SOLN: 4.0000 mg | Freq: Four times a day (QID) | INTRAMUSCULAR | Status: DC | PRN

## 2017-11-21 MED ORDER — ACETAMINOPHEN 325 MG PO TABS: 650.0000 mg | ORAL_TABLET | ORAL | Status: DC | PRN

## 2017-11-21 NOTE — H&P (Signed)
History and Physical    Alexander Duran IWO:[id number] DOB: 1946-03-25 DOA: 11/21/2017  Referring MD/NP/PA: Dr. Harrell Gave Tegeler PCP: Windy Fast, MD  Patient coming from: home  Chief Complaint: Sweats and shoulder pain  I have personally briefly reviewed patient's old medical records in [place]   HPI: Alexander Duran is a 72 y.o. male with medical history significant of HTN HLD, tonsillar cancer s/p resection, hypothyroidism, and internal carotid artery stenosis s/p stenting; who presents with complaints of sweats and shoulder pain.  The first episode occurred approximately 6 days ago, and at the time patient reports finishing breakfast 30 minutes prior.  At first he broke out into a sweat, then reported feeling dizzy/lightheaded, was unsteady on his feet, and had acute onset of achy left-sided shoulder pain that felt like it was in the muscle.  Symptoms lasted couple hours and resolved.  However, patient had a recurrent episode this morning immediately following his breakfast with similar symptoms.  Denies having any chest pain, shortness of breath, cough, nausea, vomiting, diarrhea, leg pain, calf swelling, recent travel, loss of consciousness, or recent sick contacts to his knowledge.  He reports having some mild weight loss, but he is very active at baseline and has been working outside.  He had a checkup at the [facility] back in March and notes that lab work was done and everything appeared to be relatively within normal limits.  Review of office records show patient did have elevated TSH of 4.92 at that time.  Patient reports that he has been on the same dose of levothyroxine for many years now and takes it as prescribed.  The only other thing of note is that he reports sleeping in a recliner, but reports that this is not new.  On average patient drinks 1-2 drinks per night on average.  ED Course: Upon admission into the emergency department patient is seen to be afebrile,  pulse 52-72, respirations 10-19, blood pressure 109/61-162/76, and O2 saturations maintained on room air.  Labs revealed normal CBC, sodium 132, chloride 96, and troponin negative.  No ischemic changes reported on EKG.  Chest x-ray otherwise were noted to be negative.  Due to history there was concern for possible cardiac cause of symptoms and [facility] called to admit for observation overnight.  Review of Systems  Constitutional: Positive for diaphoresis, malaise/fatigue and weight loss. Negative for fever.  HENT: Negative for congestion and ear discharge.   Eyes: Negative for photophobia and pain.  Respiratory: Negative for cough and sputum production.   Cardiovascular: Negative for chest pain and leg swelling.  Gastrointestinal: Negative for abdominal pain, nausea and vomiting.  Genitourinary: Negative for dysuria and frequency.  Musculoskeletal: Positive for joint pain and myalgias.  Neurological: Positive for weakness. Negative for focal weakness.  Psychiatric/Behavioral: Negative for hallucinations and substance abuse.    Past Medical History:  Diagnosis Date  . Cancer ([place])    Tonsil cancer  . Carotid artery stenosis without cerebral infarction 08/30/2015  . Hypertension   . Hypothyroidism 05/09/2014  . Nausea in adult 08/10/2015  . Other headache syndrome 08/10/2015  . Thyroid disease   . Weight loss 08/08/2015    Past Surgical History:  Procedure Laterality Date  . BACK SURGERY    . CAROTID PTA/STENT INTERVENTION Left 08/02/2016   Procedure: Carotid PTA/Stent Intervention;  Surgeon: Elam Dutch, MD;  Location: [place] [facility];  Service: Cardiovascular;  Laterality: Left;  . ESOPHAGOGASTRODUODENOSCOPY N/A 11/15/2016   Procedure: ESOPHAGOGASTRODUODENOSCOPY (EGD);  Surgeon: Doran Stabler, MD;  Location: [facility] ENDOSCOPY;  Service: Endoscopy;  Laterality: N/A;  . FOREIGN BODY REMOVAL N/A 11/15/2016   Procedure: FOREIGN BODY REMOVAL;  Surgeon: Doran Stabler, MD;  Location: [place];  Service: Endoscopy;  Laterality: N/A;  . PERIPHERAL VASCULAR CATHETERIZATION N/A 07/04/2016   Procedure: Aortic Arch Angiography;  Surgeon: Conrad Glascock, MD;  Location: [place] [facility];  Service: Cardiovascular;  Laterality: N/A;  . PERIPHERAL VASCULAR CATHETERIZATION N/A 07/04/2016   Procedure: Carotid & cerebral  Angiography;  Surgeon: Conrad Nellie, MD;  Location: [place] [facility];  Service: Cardiovascular;  Laterality: N/A;  . TUMOR REMOVAL     in neck     reports that he quit smoking about 16 years ago. He has a 25.00 pack-year smoking history. He has never used smokeless tobacco. He reports that he drinks about 1.2 oz of alcohol per week. He reports that he does not use drugs.  No Known Allergies  Family History  Problem Relation Age of Onset  . Cancer Father        lung cancer  . COPD Mother   . Macular degeneration Mother     Prior to Admission medications   Medication Sig Start Date End Date Taking? Authorizing Provider  acetaminophen (TYLENOL) 500 MG tablet Take 500-1,000 mg by mouth every 6 (six) hours as needed (for pain or headaches).    Yes [provider]  amLODipine (NORVASC) 10 MG tablet Take 1 tablet (10 mg total) by mouth daily. 08/10/15  Yes Heath Lark, MD  aspirin 81 MG chewable tablet Chew 81 mg by mouth every evening.    Yes [provider]  clopidogrel (PLAVIX) 75 MG tablet Take 1 tablet (75 mg total) by mouth daily. 07/11/16  Yes Fields, Jessy Oto, MD  diclofenac sodium (VOLTAREN) 1 % GEL Apply 2 g topically See admin instructions. Apply 2 grams to painful areas as needed/as directed for joint pain   Yes [provider]  DULoxetine (CYMBALTA) 30 MG capsule Take 30 mg by mouth daily.    Yes [provider]  levothyroxine (SYNTHROID, LEVOTHROID) 75 MCG tablet Take 75 mcg by mouth daily before breakfast.    Yes [provider]  lisinopril (PRINIVIL,ZESTRIL) 5 MG tablet Take 1 tablet (5 mg total) by mouth  daily. Patient taking differently: Take 5 mg by mouth every evening.  08/10/15  Yes Gorsuch, Ni, MD  OVER THE COUNTER MEDICATION TheraTears Liquid Eye Drops- Place 1-2 drops into both eyes one to three times as day as needed for dryness or irritation   Yes [provider]    Physical Exam:  Constitutional: NAD, calm, comfortable Vitals:   11/21/17 2115 11/21/17 2130 11/21/17 2145 11/21/17 2210  BP:  111/61  (!) 148/80  Pulse: (!) 56 (!) 52 67 63  Resp:  14  19  Temp:      TempSrc:      SpO2: 97% 96% 100% (!) 81%  Weight:      Height:       Eyes: PERRL, lids and conjunctivae normal ENMT: Mucous membranes are moist. Posterior pharynx clear of any exudate or lesions.Normal dentition.  Neck: Deformity of the neck related with cancer history and radiation. Respiratory: clear to auscultation bilaterally, no wheezing, no crackles. Normal respiratory effort. No accessory muscle use.  Cardiovascular: Regular rate and rhythm, no murmurs / rubs / gallops. No extremity edema. 2+ pedal pulses. No carotid bruits.  Abdomen: no tenderness,  no masses palpated. No hepatosplenomegaly. Bowel sounds positive.  Musculoskeletal: no clubbing / cyanosis. No joint deformity upper and lower extremities. Good ROM, no contractures. Normal muscle tone.  Skin: no rashes, lesions, ulcers. No induration Neurologic: CN 2-12 grossly intact. Sensation intact, DTR normal. Strength 5/5 in all 4.  Psychiatric: Normal judgment and insight. Alert and oriented x 3. Normal mood.     Labs on Admission: I have personally reviewed following labs and imaging studies  CBC: Recent Labs  Lab 11/21/17 1314  WBC 4.7  HGB 15.4  HCT 44.2  MCV 94.6  PLT 606   Basic Metabolic Panel: Recent Labs  Lab 11/21/17 1314  NA 132*  K 4.3  CL 96*  CO2 27  GLUCOSE 117*  BUN 9  CREATININE 0.62  CALCIUM 9.5   GFR: Estimated Creatinine Clearance: 84.3 mL/min (by C-G formula based on SCr of 0.62 mg/dL). Liver Function  Tests: No results for input(s): AST, ALT, ALKPHOS, BILITOT, PROT, ALBUMIN in the last 168 hours. No results for input(s): LIPASE, AMYLASE in the last 168 hours. No results for input(s): AMMONIA in the last 168 hours. Coagulation Profile: No results for input(s): INR, PROTIME in the last 168 hours. Cardiac Enzymes: No results for input(s): CKTOTAL, CKMB, CKMBINDEX, TROPONINI in the last 168 hours. BNP (last 3 results) No results for input(s): PROBNP in the last 8760 hours. HbA1C: No results for input(s): HGBA1C in the last 72 hours. CBG: No results for input(s): GLUCAP in the last 168 hours. Lipid Profile: No results for input(s): CHOL, HDL, LDLCALC, TRIG, CHOLHDL, LDLDIRECT in the last 72 hours. Thyroid Function Tests: No results for input(s): TSH, T4TOTAL, FREET4, T3FREE, THYROIDAB in the last 72 hours. Anemia Panel: No results for input(s): VITAMINB12, FOLATE, FERRITIN, TIBC, IRON, RETICCTPCT in the last 72 hours. Urine analysis: No results found for: COLORURINE, APPEARANCEUR, LABSPEC, PHURINE, GLUCOSEU, HGBUR, BILIRUBINUR, KETONESUR, PROTEINUR, UROBILINOGEN, NITRITE, LEUKOCYTESUR Sepsis Labs: No results found for this or any previous visit (from the past 240 hour(s)).   Radiological Exams on Admission: Dg Chest 2 View  Result Date: 11/21/2017 CLINICAL DATA:  Left shoulder pain.  Sweating. EXAM: CHEST - 2 VIEW COMPARISON:  CT chest 11/15/2016. FINDINGS: Heart size is normal. The lungs are hyperexpanded. There is no edema or effusion. No focal airspace disease is present. The visualized soft tissues and bony thorax are unremarkable. IMPRESSION: No active cardiopulmonary disease. Electronically Signed   By: San Morelle M.D.   On: 11/21/2017 13:35    EKG: Independently reviewed.  Sinus rhythm with left bundle branch block 74 bpm similar to previous tracings.  Assessment/Plan Episodes of diaphoresis, weakness, and left shoulder pain: Patient presents with intermittent episodes  of diaphoresis weakness and left shoulder pain.  Initial troponin negative and EKG similar to previous without any significant ischemic changes appreciated.  Chest x-ray clear of any acute abnormality.  Question possibility of a cardiac cause of symptoms. - Admit to a telemetry bed - check DDimer, ESR, CRP - Trend cardiac troponins overnight - Check echocardiogram in a.m.  - Follow-up telemetry overnight  - May warrant formal consult of cardiology in a.m.  Hypothyroidism - Check TSH - Continue levothyroxine  Hyponatremia: acute on chronic.  Sodium levels noted to be around 132 on admission.  Could be related to patient's history of alcohol use. - Continue to monitor   H/O  internal carotid artery stenosis status post stenting - Continue Plavix and aspirin  Essential hypertension - Continue amlodipine and lisinopril  H/O Tonsillar cancer  status post resection  Alcohol use - CIWA protocols without scheduled Ativan  DVT prophylaxis: Lovenox Code Status: Full Family Communication: No family present at bedside Disposition Plan: Discharge home if work-up negative Consults called: None Admission status: Observation  Norval Morton MD [facility] Pager [phone]   If 7PM-7AM, please contact night-coverage [email or web address] Password [facility]  11/21/2017, 10:29 PM

## 2017-11-21 NOTE — ED Provider Notes (Signed)
[place] EMERGENCY DEPARTMENT Provider Note   CSN: [id number] Arrival date & time: 11/21/17  1306     History   Chief Complaint Chief Complaint  Patient presents with  . Shoulder Pain    HPI Alexander Duran is a 72 y.o. male.  The history is provided by the patient and medical records.  Shoulder Pain   This is a recurrent problem. The current episode started 12 to 24 hours ago. The problem occurs rarely. The problem has been resolved. The pain is present in the left shoulder. The quality of the pain is described as aching and dull. The pain is at a severity of 5/10. The pain is moderate. Pertinent negatives include no numbness, no stiffness and no itching. He has tried nothing for the symptoms. The treatment provided no relief. There has been no history of extremity trauma.    Past Medical History:  Diagnosis Date  . Cancer ([place])    Tonsil cancer  . Carotid artery stenosis without cerebral infarction 08/30/2015  . Hypertension   . Hypothyroidism 05/09/2014  . Nausea in adult 08/10/2015  . Other headache syndrome 08/10/2015  . Thyroid disease   . Weight loss 08/08/2015    Patient Active Problem List   Diagnosis Date Noted  . Esophageal obstruction due to food impaction   . Left carotid stenosis 08/02/2016  . Carotid disease, bilateral ([place]) 09/14/2015  . Carotid artery stenosis without cerebral infarction 08/30/2015  . Other headache syndrome 08/10/2015  . Nausea in adult 08/10/2015  . Weight loss 08/08/2015  . Hypothyroidism 05/09/2014  . Essential hypertension 05/09/2014  . Tonsil cancer ([place]) 05/04/2013    Past Surgical History:  Procedure Laterality Date  . BACK SURGERY    . CAROTID PTA/STENT INTERVENTION Left 08/02/2016   Procedure: Carotid PTA/Stent Intervention;  Surgeon: Elam Dutch, MD;  Location: [place] [facility];  Service: Cardiovascular;  Laterality: Left;  . ESOPHAGOGASTRODUODENOSCOPY N/A 11/15/2016   Procedure:  ESOPHAGOGASTRODUODENOSCOPY (EGD);  Surgeon: Doran Stabler, MD;  Location: [facility] ENDOSCOPY;  Service: Endoscopy;  Laterality: N/A;  . FOREIGN BODY REMOVAL N/A 11/15/2016   Procedure: FOREIGN BODY REMOVAL;  Surgeon: Doran Stabler, MD;  Location: [place];  Service: Endoscopy;  Laterality: N/A;  . PERIPHERAL VASCULAR CATHETERIZATION N/A 07/04/2016   Procedure: Aortic Arch Angiography;  Surgeon: Conrad Weyauwega, MD;  Location: [place] [facility];  Service: Cardiovascular;  Laterality: N/A;  . PERIPHERAL VASCULAR CATHETERIZATION N/A 07/04/2016   Procedure: Carotid & cerebral  Angiography;  Surgeon: Conrad [place], MD;  Location: [place] [facility];  Service: Cardiovascular;  Laterality: N/A;  . TUMOR REMOVAL     in neck        Home Medications    Prior to Admission medications   Medication Sig Start Date End Date Taking? Authorizing Provider  amLODipine (NORVASC) 10 MG tablet Take 1 tablet (10 mg total) by mouth daily. 08/10/15   Heath Lark, MD  aspirin 81 MG chewable tablet Chew 81 mg by mouth every evening.     [provider]  clopidogrel (PLAVIX) 75 MG tablet Take 1 tablet (75 mg total) by mouth daily. 07/11/16   Elam Dutch, MD  DULoxetine (CYMBALTA) 30 MG capsule Take 30 mg by mouth daily at 12 noon.     [provider]  HYDROcodone-acetaminophen (NORCO/VICODIN) 5-325 MG tablet Take 0.5-1 tablets by mouth daily as needed for moderate pain.     [provider]  levothyroxine (SYNTHROID,  LEVOTHROID) 75 MCG tablet Take 75 mcg by mouth daily before breakfast.     [provider]  lisinopril (PRINIVIL,ZESTRIL) 5 MG tablet Take 1 tablet (5 mg total) by mouth daily. Patient taking differently: Take 5 mg by mouth every evening.  08/10/15   Heath Lark, MD  Polyethyl Glycol-Propyl Glycol (SYSTANE OP) Place 1 drop into both eyes daily.     [provider]    Family History Family History  Problem Relation Age of Onset  . Cancer Father         lung cancer  . COPD Mother   . Macular degeneration Mother     Social History Social History   Tobacco Use  . Smoking status: Former Smoker    Packs/day: 1.00    Years: 25.00    Pack years: 25.00    Last attempt to quit: 05/04/2001    Years since quitting: 16.5  . Smokeless tobacco: Never Used  Substance Use Topics  . Alcohol use: Yes    Alcohol/week: 1.2 oz    Types: 2 Shots of liquor per week    Comment: rarely  . Drug use: No     Allergies   Patient has no known allergies.   Review of Systems Review of Systems  Constitutional: Positive for diaphoresis and fatigue. Negative for chills and fever.  HENT: Negative for congestion.   Respiratory: Negative for cough, chest tightness, shortness of breath, wheezing and stridor.   Cardiovascular: Negative for chest pain and palpitations.  Gastrointestinal: Positive for nausea. Negative for abdominal pain, constipation, diarrhea and vomiting.  Musculoskeletal: Negative for back pain, neck pain, neck stiffness and stiffness.  Skin: Negative for itching, rash and wound.  Neurological: Positive for light-headedness. Negative for numbness and headaches.  Psychiatric/Behavioral: Negative for agitation.  All other systems reviewed and are negative.    Physical Exam Updated Vital Signs BP (!) 158/76 (BP Location: Right Arm)   Pulse 61   Temp 97.9 F (36.6 C) (Oral)   Resp 17   Ht 5\' 8"  (1.727 m)   Wt 79.4 kg (175 lb)   SpO2 98%   BMI 26.61 kg/m   Physical Exam  Constitutional: He is oriented to person, place, and time. He appears well-developed and well-nourished. No distress.  HENT:  Head: Normocephalic and atraumatic.  Mouth/Throat: Oropharynx is clear and moist.  Eyes: Pupils are equal, round, and reactive to light. Conjunctivae and EOM are normal.  Neck: Normal range of motion. Neck supple.  Cardiovascular: Normal rate, regular rhythm and normal heart sounds.  No murmur heard. Pulmonary/Chest: Effort normal and  breath sounds normal. No respiratory distress. He has no wheezes. He exhibits no tenderness.  Abdominal: Soft. There is no tenderness. There is no rebound and no guarding.  Musculoskeletal: He exhibits no edema or tenderness.  Neurological: He is alert and oriented to person, place, and time.  Skin: Skin is warm and dry. Capillary refill takes less than 2 seconds. He is not diaphoretic. No erythema. No pallor.  Psychiatric: He has a normal mood and affect.  Nursing note and vitals reviewed.    ED Treatments / Results  Labs (all labs ordered are listed, but only abnormal results are displayed) Labs Reviewed  BASIC METABOLIC PANEL - Abnormal; Notable for the following components:      Result Value   Sodium 132 (*)    Chloride 96 (*)    Glucose, Bld 117 (*)    All other components within normal limits  BASIC METABOLIC  PANEL - Abnormal; Notable for the following components:   Sodium 133 (*)    Glucose, Bld 167 (*)    All other components within normal limits  CBC  D-DIMER, QUANTITATIVE (NOT AT [facility])  TROPONIN I  TSH  SEDIMENTATION RATE  C-REACTIVE PROTEIN  T4, FREE  TROPONIN I  TROPONIN I  CBC WITH DIFFERENTIAL/PLATELET  I-STAT TROPONIN, ED    EKG EKG Interpretation  Date/Time:  Friday November 21 2017 13:11:51 EDT Ventricular Rate:  74 PR Interval:  158 QRS Duration: 148 QT Interval:  440 QTC Calculation: 488 R Axis:   -36 Text Interpretation:  Normal sinus rhythm Left axis deviation Right bundle branch block Abnormal ECG When compared to prior, no significant changes seen.  No STEMI Confirmed by Antony Blackbird [phone]) on 11/21/2017 4:41:28 PM   Radiology Dg Chest 2 View  Result Date: 11/21/2017 CLINICAL DATA:  Left shoulder pain.  Sweating. EXAM: CHEST - 2 VIEW COMPARISON:  CT chest 11/15/2016. FINDINGS: Heart size is normal. The lungs are hyperexpanded. There is no edema or effusion. No focal airspace disease is present. The visualized soft tissues and bony thorax are  unremarkable. IMPRESSION: No active cardiopulmonary disease. Electronically Signed   By: San Morelle M.D.   On: 11/21/2017 13:35    Procedures Procedures (including critical care time)  Medications Ordered in ED Medications  levothyroxine (SYNTHROID, LEVOTHROID) tablet 75 mcg (has no administration in time range)  DULoxetine (CYMBALTA) DR capsule 30 mg (has no administration in time range)  clopidogrel (PLAVIX) tablet 75 mg (has no administration in time range)  aspirin chewable tablet 81 mg (0 mg Oral Hold 11/21/17 2247)  amLODipine (NORVASC) tablet 10 mg (has no administration in time range)  diclofenac sodium (VOLTAREN) 1 % transdermal gel 2 g (has no administration in time range)  lisinopril (PRINIVIL,ZESTRIL) tablet 5 mg (5 mg Oral Given 11/22/17 0055)  acetaminophen (TYLENOL) tablet 650 mg (has no administration in time range)  ondansetron (ZOFRAN) injection 4 mg (has no administration in time range)  enoxaparin (LOVENOX) injection 40 mg (has no administration in time range)  morphine 4 MG/ML injection 2 mg (has no administration in time range)  gi cocktail (Maalox,Lidocaine,Donnatal) (has no administration in time range)  LORazepam (ATIVAN) tablet 1 mg (has no administration in time range)    Or  LORazepam (ATIVAN) injection 1 mg (has no administration in time range)  thiamine (VITAMIN B-1) tablet 100 mg (has no administration in time range)    Or  thiamine (B-1) injection 100 mg (has no administration in time range)  folic acid (FOLVITE) tablet 1 mg (has no administration in time range)  multivitamin with minerals tablet 1 tablet (has no administration in time range)  aspirin chewable tablet 324 mg (324 mg Oral Given 11/21/17 2207)     Initial Impression / Assessment and Plan / ED Course  I have reviewed the triage vital signs and the nursing notes.  Pertinent labs & imaging results that were available during my care of the patient were reviewed by me and considered in  my medical decision making (see chart for details).     Alexander Duran is a 72 y.o. male with a past medical history significant for hypertension, throat cancer status post surgery and radiation, bilateral carotid stenosis status post carotid stenting, and thyroid disease who presents with multiple episodes of diaphoresis, lightheadedness, and left shoulder pain.  Patient reports that in the last week he has had 2 episodes that had similar symptoms.  He  reports that one week ago he had 3 hours of the left shoulder pain with diaphoresis, lightheadedness, and left shoulder pain.  He denied any numbness, tingling, or nausea/vomiting.  He denied chest pain or shortness of breath with this but he does say his shoulder pain is not worsened with any arm movement and he denies trauma.  He reports that it went away and he had no other symptoms until this morning when he had recurrence of symptoms.  He says that he is unsure if it was exertional or pleuritic but he reports the pain was still an aching in his left shoulder.  He reports he got very diaphoretic and somewhat lightheaded.  He reports the pain lasted approximately 10 minutes and then went away.  He is here because he is concerned about the symptoms.  On exam, chest is nontender.  Lungs are clear.  Patient has no tenderness in the shoulder and has no pain with arm movement.  He has symmetric pulses in upper extremities, no edema seen and patient appears well.  Heart score calculated as a 6.  Patient reports his mother and father both had early MI.  Patient's EKG shows a bundle branch block similar to prior.  Initial troponin is negative.  Other laboratory testing is overall reassuring.  Chest x-ray showed no pneumonia.  Next  Clinically I am concerned that his shoulder pain might be related to a cardiac etiology given his elevated heart score and the severe diaphoresis.  Patient has not had any symptoms here in the emergency department.     Hospitalist team will be called for admission for chest pain rule out.   Final Clinical Impressions(s) / ED Diagnoses   Final diagnoses:  Acute pain of left shoulder    ED Discharge Orders    None     Clinical Impression: 1. Acute pain of left shoulder     Disposition: Admit  This note was prepared with assistance of Dragon voice recognition software. Occasional wrong-word or sound-a-like substitutions may have occurred due to the inherent limitations of voice recognition software.      Tegeler, Gwenyth Allegra, MD 11/22/17 [phone]

## 2017-11-21 NOTE — ED Triage Notes (Signed)
Pt endorses breaking out in a sweat and left shoulder pain and dizziness that began just prior to arrival, also had similar episode last Sunday. Has hx of carotid artery blockage with stent placement. Denies CP.

## 2017-11-22 ENCOUNTER — Observation Stay (HOSPITAL_BASED_OUTPATIENT_CLINIC_OR_DEPARTMENT_OTHER): Payer: No Typology Code available for payment source

## 2017-11-22 ENCOUNTER — Other Ambulatory Visit: Payer: Self-pay

## 2017-11-22 DIAGNOSIS — I10 Essential (primary) hypertension: Secondary | ICD-10-CM | POA: Diagnosis not present

## 2017-11-22 DIAGNOSIS — M25519 Pain in unspecified shoulder: Secondary | ICD-10-CM | POA: Diagnosis present

## 2017-11-22 DIAGNOSIS — E871 Hypo-osmolality and hyponatremia: Secondary | ICD-10-CM | POA: Diagnosis present

## 2017-11-22 DIAGNOSIS — R61 Generalized hyperhidrosis: Secondary | ICD-10-CM | POA: Diagnosis present

## 2017-11-22 DIAGNOSIS — M25512 Pain in left shoulder: Secondary | ICD-10-CM | POA: Diagnosis not present

## 2017-11-22 DIAGNOSIS — E038 Other specified hypothyroidism: Secondary | ICD-10-CM | POA: Diagnosis not present

## 2017-11-22 LAB — ECHOCARDIOGRAM COMPLETE
Height: 68 in
Weight: 2510.4 oz

## 2017-11-22 LAB — BASIC METABOLIC PANEL
ANION GAP: 7 (ref 5–15)
BUN: 9 mg/dL (ref 6–20)
CHLORIDE: 101 mmol/L (ref 101–111)
CO2: 25 mmol/L (ref 22–32)
CREATININE: 0.75 mg/dL (ref 0.61–1.24)
Calcium: 8.9 mg/dL (ref 8.9–10.3)
GFR calc non Af Amer: >60 mL/min (ref >60)
Glucose, Bld: 167 mg/dL — ABNORMAL HIGH (ref 65–99)
POTASSIUM: 3.5 mmol/L (ref 3.5–5.1)
Sodium: 133 mmol/L — ABNORMAL LOW (ref 135–145)

## 2017-11-22 LAB — C-REACTIVE PROTEIN: CRP: <0.8 mg/dL (ref <1.0)

## 2017-11-22 LAB — CBC WITH DIFFERENTIAL/PLATELET
ABS IMMATURE GRANULOCYTES: 0 10*3/uL (ref 0.0–0.1)
BASOS ABS: 0.1 10*3/uL (ref 0.0–0.1)
Basophils Relative: 1 %
Eosinophils Absolute: 0.1 10*3/uL (ref 0.0–0.7)
Eosinophils Relative: 2 %
HCT: 38.6 % — ABNORMAL LOW (ref 39.0–52.0)
HEMOGLOBIN: 13.3 g/dL (ref 13.0–17.0)
IMMATURE GRANULOCYTES: 0 %
LYMPHS PCT: 13 %
Lymphs Abs: 0.5 10*3/uL — ABNORMAL LOW (ref 0.7–4.0)
MCH: 32.6 pg (ref 26.0–34.0)
MCHC: 34.5 g/dL (ref 30.0–36.0)
MCV: 94.6 fL (ref 78.0–100.0)
Monocytes Absolute: 0.5 10*3/uL (ref 0.1–1.0)
Monocytes Relative: 11 %
NEUTROS ABS: 3.2 10*3/uL (ref 1.7–7.7)
Neutrophils Relative %: 73 %
Platelets: 267 10*3/uL (ref 150–400)
RBC: 4.08 MIL/uL — AB (ref 4.22–5.81)
RDW: 13.5 % (ref 11.5–15.5)
WBC: 4.3 10*3/uL (ref 4.0–10.5)

## 2017-11-22 LAB — TSH: TSH: 2.624 u[IU]/mL (ref 0.350–4.500)

## 2017-11-22 LAB — T4, FREE: FREE T4: 0.92 ng/dL (ref 0.82–1.77)

## 2017-11-22 LAB — TROPONIN I
Troponin I: <0.03 ng/mL (ref <0.03)
Troponin I: <0.03 ng/mL (ref <0.03)

## 2017-11-22 LAB — SEDIMENTATION RATE: SED RATE: 6 mm/h (ref 0–16)

## 2017-11-22 NOTE — Discharge Summary (Signed)
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  He had serial cardiac enzymes all negative.  He has a right bundle branch block which was old compared to old EKGs here.  He had a cardiac echo done which was also unrevealing and essentially normal.  Patient is being discharged home in stable and improved condition without any concern for acute ACS.  He is been encouraged to follow-up with his [facility] for recommendations of outpatient cardiac stress testing with his PCP for complete work-up.  See admit H&P for full details of his admission.  Thank you very much and have a nice day.  Chest x-ray was negative.   Discharge Exam: Vitals:   11/21/17 2331 11/22/17 0501  BP: (!) 145/85 (!) 149/76  Pulse: 70 (!) 56  Resp: 18 16  Temp: 98.4 F (36.9 C) 97.8 F (36.6 C)  SpO2: 98% 98%    General: Regular rate and rhythm without murmurs rubs or gallops Cardiovascular: Clear to auscultation bilaterally no wheeze or rhonchi rales Respiratory: Alert and oriented x4 no apparent distress  Discharge Instructions   Discharge Instructions    Diet - low sodium heart healthy   Complete by:  As directed    Increase activity slowly   Complete by:  As directed       Allergies as of 11/22/2017   No Known Allergies     Medication List    TAKE these medications   acetaminophen 500 MG tablet Commonly known as:  TYLENOL Take 500-1,000 mg by mouth every 6 (six) hours as needed (for pain or headaches).   amLODipine 10 MG tablet Commonly known as:  NORVASC Take 1 tablet (10 mg total) by mouth daily.   aspirin 81 MG chewable tablet Chew 81 mg by mouth every evening.   clopidogrel 75 MG tablet Commonly known as:  PLAVIX Take 1 tablet (75 mg total) by mouth daily.   diclofenac sodium 1 % Gel Commonly known as:  VOLTAREN Apply 2 g topically See admin instructions. Apply 2 grams to painful areas as needed/as directed for joint pain   DULoxetine 30 MG capsule Commonly known as:  CYMBALTA Take 30 mg by mouth daily.   levothyroxine 75 MCG tablet Commonly known as:  SYNTHROID, LEVOTHROID Take 75 mcg by mouth daily before breakfast.   lisinopril 5 MG tablet Commonly known as:  ZESTRIL Take 1 tablet (5 mg total) by mouth daily. What changed:  when to take this   OVER THE COUNTER MEDICATION TheraTears Liquid Eye Drops- Place 1-2 drops into both eyes one to three times as day as needed for dryness or irritation      No Known Allergies Follow-up Information    Windy Fast, MD Follow up in 1 week(s).   Specialty:  Internal Medicine Contact information: [phone] Marga Melnick [phone]            The results of significant diagnostics from this hospitalization (including imaging, microbiology, ancillary and laboratory) are listed below for reference.    Significant Diagnostic Studies: Dg Chest 2 View  Result Date: 11/21/2017 CLINICAL DATA:  Left shoulder pain.  Sweating. EXAM: CHEST - 2 VIEW COMPARISON:  CT chest 11/15/2016. FINDINGS: Heart size is normal. The lungs are hyperexpanded. There is no edema or effusion. No focal airspace disease is present. The visualized soft tissues and bony thorax are unremarkable.  IMPRESSION: No active cardiopulmonary disease. Electronically Signed   By: San Morelle M.D.   On: 11/21/2017 13:35    Microbiology: No results found for this or any previous visit (from the past 240 hour(s)).   Labs: Basic Metabolic Panel: Recent Labs  Lab 11/21/17 1314 11/22/17 0027  NA 132* 133*  K 4.3 3.5  CL 96* 101  CO2 27 25  GLUCOSE 117* 167*  BUN 9 9  CREATININE 0.62 0.75  CALCIUM 9.5 8.9   Liver Function Tests: No results for input(s): AST, ALT, ALKPHOS, BILITOT, PROT, ALBUMIN in the last 168 hours. No results for input(s): LIPASE, AMYLASE in the last 168 hours. No results for input(s): AMMONIA in the last 168 hours. CBC: Recent Labs  Lab 11/21/17 1314 11/22/17 1210  WBC 4.7 4.3  NEUTROABS  --  3.2  HGB 15.4 13.3  HCT 44.2 38.6*  MCV 94.6 94.6  PLT 273 267   Cardiac Enzymes: Recent Labs  Lab 11/22/17 0027 11/22/17 0726  TROPONINI <0.03 <0.03   BNP: BNP (last 3 results) No results for input(s): BNP in the last 8760 hours.  ProBNP (last 3 results) No results for input(s): PROBNP in the last 8760 hours.  CBG: No results for input(s): GLUCAP in the last 168 hours.     Signed:  Phillips Grout MD.  [facility] 11/22/2017, 2:01 PM

## 2017-11-22 NOTE — Progress Notes (Signed)
  Echocardiogram 2D Echocardiogram has been performed.  Johny Chess 11/22/2017, 10:53 AM

## 2017-12-03 ENCOUNTER — Emergency Department (HOSPITAL_COMMUNITY)
Admission: EM | Admit: 2017-12-03 | Discharge: 2017-12-03 | Disposition: A | Discharge status: Home / Self Care | Payer: No Typology Code available for payment source | Attending: Emergency Medicine | Admitting: Emergency Medicine

## 2017-12-03 ENCOUNTER — Other Ambulatory Visit: Payer: Self-pay

## 2017-12-03 ENCOUNTER — Encounter (HOSPITAL_COMMUNITY): Payer: Self-pay

## 2017-12-03 ENCOUNTER — Emergency Department (HOSPITAL_COMMUNITY): Payer: No Typology Code available for payment source

## 2017-12-03 DIAGNOSIS — R531 Weakness: Secondary | ICD-10-CM | POA: Insufficient documentation

## 2017-12-03 DIAGNOSIS — Z79899 Other long term (current) drug therapy: Secondary | ICD-10-CM | POA: Insufficient documentation

## 2017-12-03 DIAGNOSIS — R55 Syncope and collapse: Secondary | ICD-10-CM

## 2017-12-03 DIAGNOSIS — Z7982 Long term (current) use of aspirin: Secondary | ICD-10-CM | POA: Insufficient documentation

## 2017-12-03 DIAGNOSIS — Z87891 Personal history of nicotine dependence: Secondary | ICD-10-CM | POA: Insufficient documentation

## 2017-12-03 DIAGNOSIS — E039 Hypothyroidism, unspecified: Secondary | ICD-10-CM | POA: Insufficient documentation

## 2017-12-03 DIAGNOSIS — I10 Essential (primary) hypertension: Secondary | ICD-10-CM | POA: Insufficient documentation

## 2017-12-03 DIAGNOSIS — R42 Dizziness and giddiness: Secondary | ICD-10-CM | POA: Diagnosis present

## 2017-12-03 LAB — CBC
HEMATOCRIT: 37.7 % — AB (ref 39.0–52.0)
HEMOGLOBIN: 13.3 g/dL (ref 13.0–17.0)
MCH: 33.2 pg (ref 26.0–34.0)
MCHC: 35.3 g/dL (ref 30.0–36.0)
MCV: 94 fL (ref 78.0–100.0)
Platelets: 254 10*3/uL (ref 150–400)
RBC: 4.01 MIL/uL — ABNORMAL LOW (ref 4.22–5.81)
RDW: 12.6 % (ref 11.5–15.5)
WBC: 3.2 10*3/uL — ABNORMAL LOW (ref 4.0–10.5)

## 2017-12-03 LAB — TSH: TSH: 1.113 u[IU]/mL (ref 0.350–4.500)

## 2017-12-03 LAB — I-STAT TROPONIN, ED: TROPONIN I, POC: 0 ng/mL (ref 0.00–0.08)

## 2017-12-03 LAB — BASIC METABOLIC PANEL
ANION GAP: 12 (ref 5–15)
BUN: 8 mg/dL (ref 6–20)
CALCIUM: 9.3 mg/dL (ref 8.9–10.3)
CO2: 23 mmol/L (ref 22–32)
Chloride: 99 mmol/L — ABNORMAL LOW (ref 101–111)
Creatinine, Ser: 0.65 mg/dL (ref 0.61–1.24)
Glucose, Bld: 94 mg/dL (ref 65–99)
POTASSIUM: 4 mmol/L (ref 3.5–5.1)
Sodium: 134 mmol/L — ABNORMAL LOW (ref 135–145)

## 2017-12-03 LAB — CBG MONITORING, ED: Glucose-Capillary: 87 mg/dL (ref 65–99)

## 2017-12-03 LAB — T4, FREE: FREE T4: 1.22 ng/dL (ref 0.82–1.77)

## 2017-12-03 IMAGING — DX DG CHEST 2V
2 series · 2 of 2 positions shown · non-contrast
Comparison: PA and lateral chest x-ray [DATE]

CLINICAL DATA: Diaphoresis, headache, and weakness intermittently
in the mornings for the past 2 weeks. History of stent placement in
[H2]. No chest pain or nausea or vomiting. Former smoker.

EXAM:
CHEST - 2 VIEW

[chest pa]
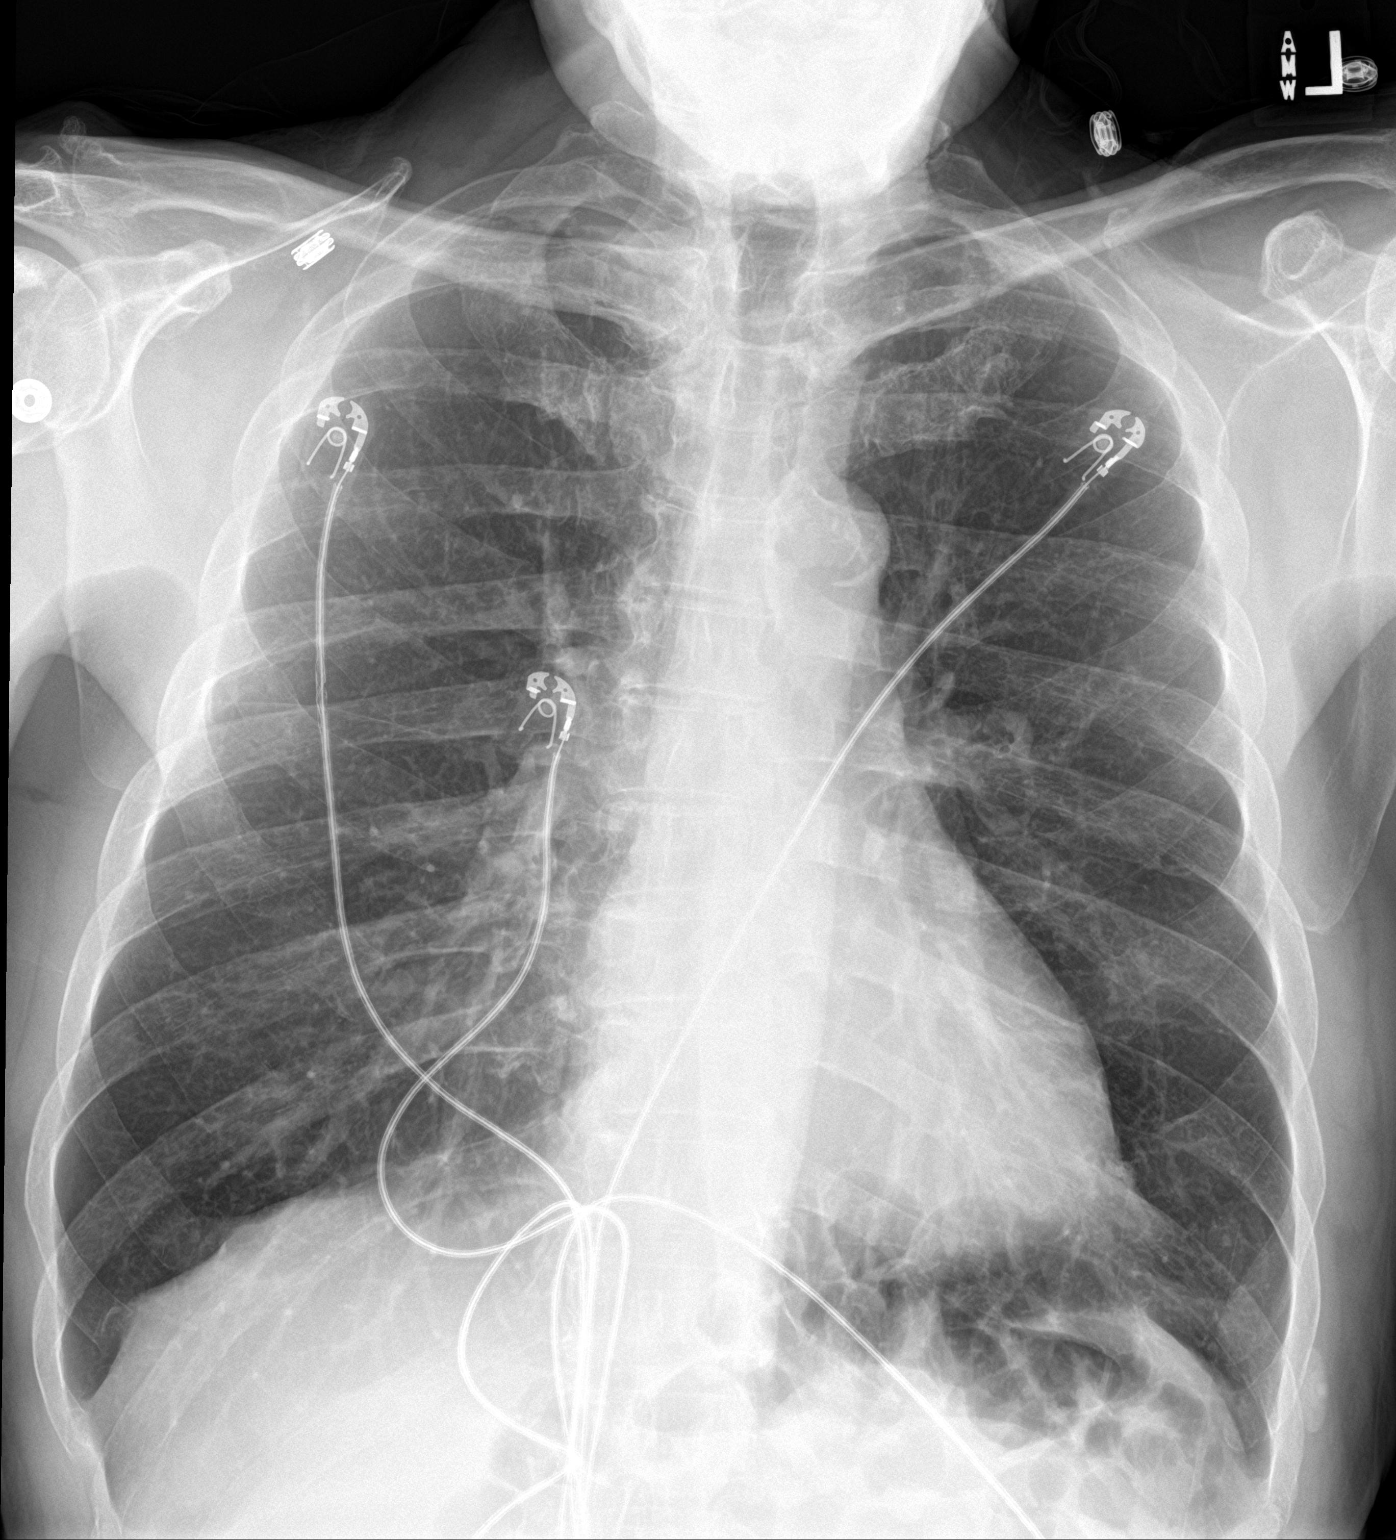

[chest lat]
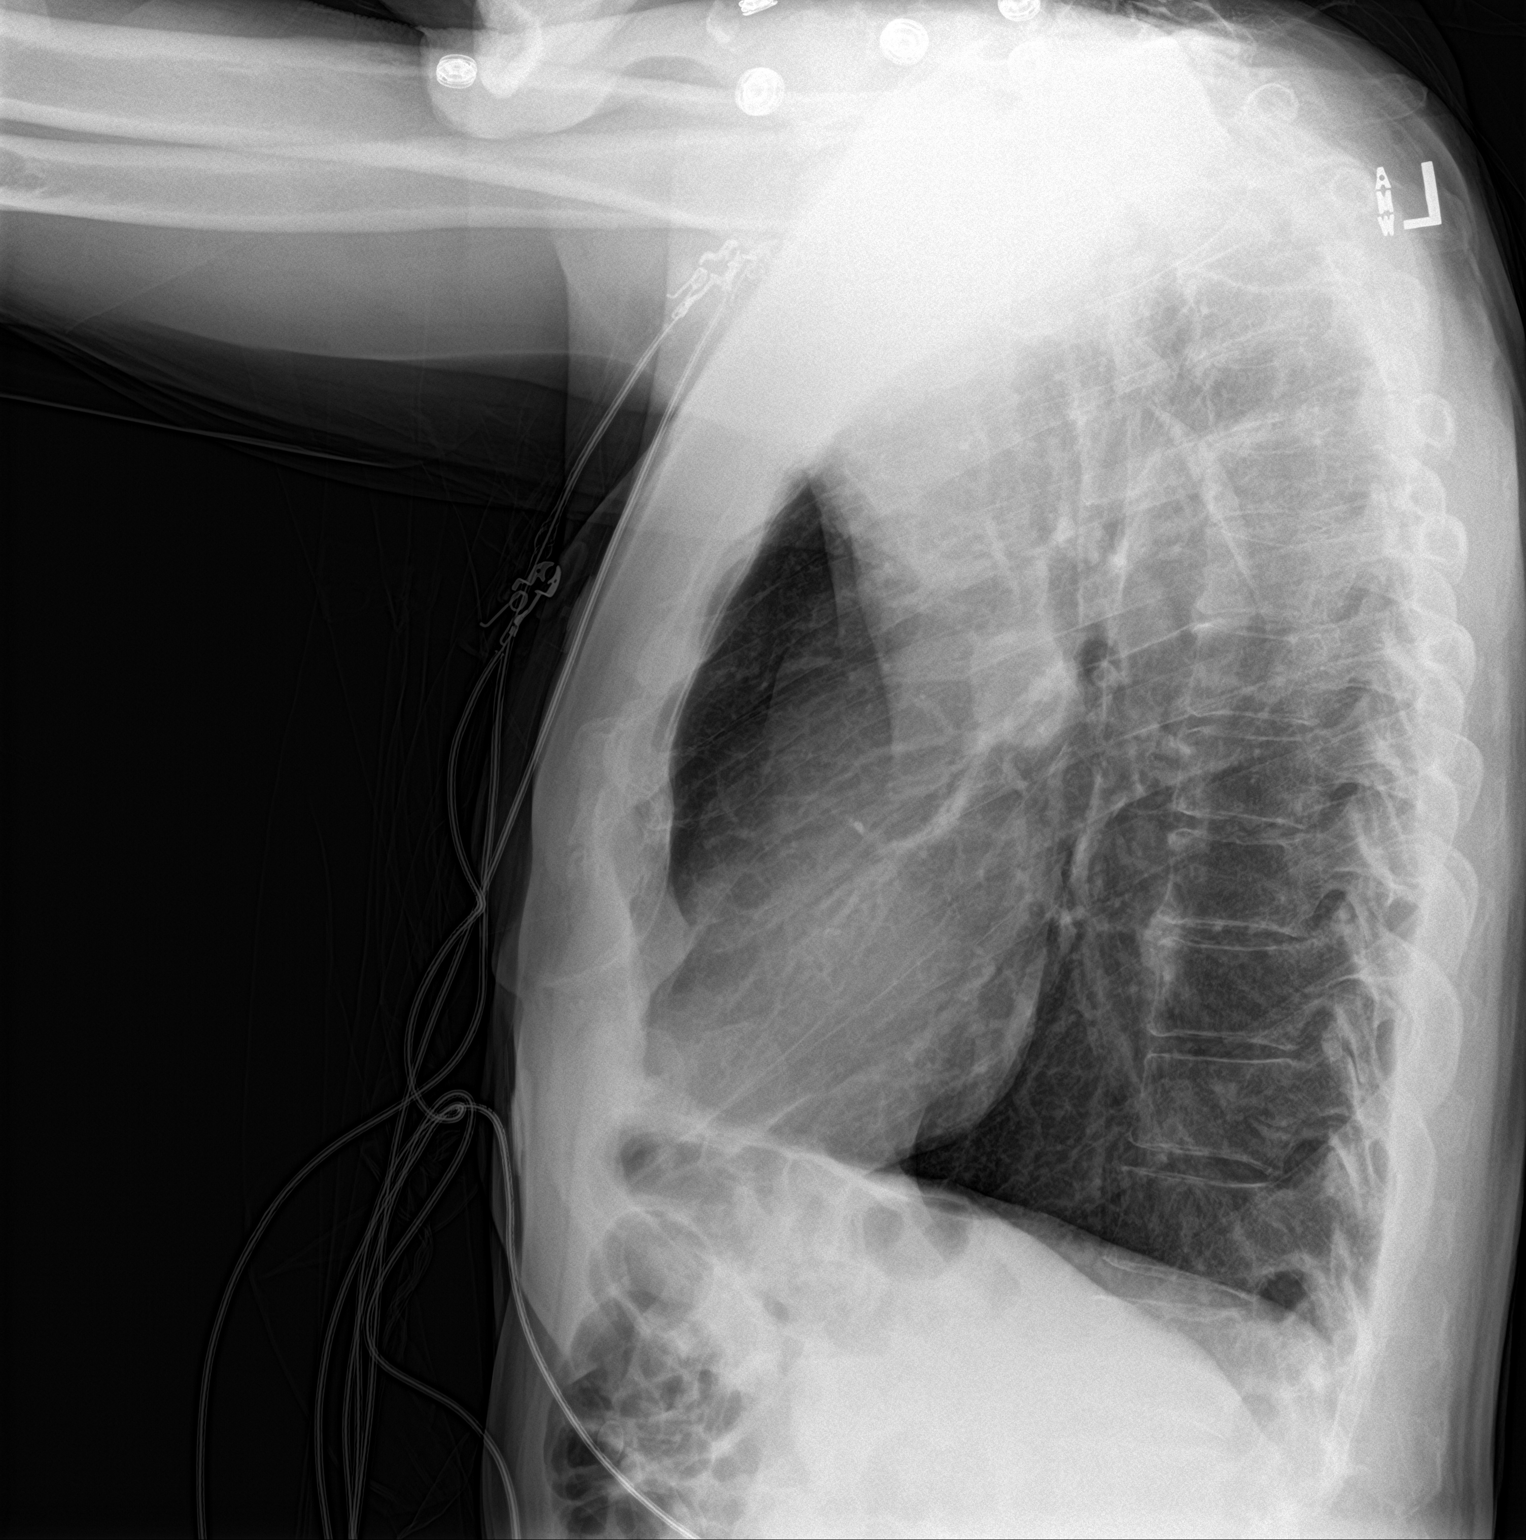

[2 of 2 positions shown; findings below may reference images not displayed]

FINDINGS: The lungs are mildly hyperinflated with hemidiaphragm flattening.
The heart and pulmonary vascularity are normal. There is
calcification in the wall of the aortic arch. The bony thorax
exhibits no acute abnormality.
IMPRESSION: Chronic bronchitic changes, stable. No pneumonia, CHF, nor other
acute cardiopulmonary abnormality.

Thoracic aortic atherosclerosis.

## 2017-12-03 NOTE — ED Provider Notes (Signed)
[facility] EMERGENCY DEPARTMENT Provider Note   CSN: [id number] Arrival date & time: 12/03/17  1122     History   Chief Complaint Chief Complaint  Patient presents with  . Dizziness    HPI Alexander Duran is a 72 y.o. male.  HPI  Patient presents with concern of episodic dizziness. Episodes typically occur after breakfast each day, and he had the last one yesterday. He has been seen previously at this facility for these episodes, has not yet had scheduled follow-up with his physician. He recalls that he typically awakens, and after taking Synthroid, drinking water, eating breakfast, taking his Plavix, he has several moments of dizziness, lightheadedness, without focal pain, without focal weakness. Yesterday his episode was similar, and today the patient felt hesitant to eat, or take his remaining meds due to concern for a recurrence. Currently he denies any focal complaints beyond generalized weakness. Patient has multiple medical issues including prior malignancy, hypertension, cardiac disease. He notes that he has had no medication changes recently, has had substantial weight loss, approximately 20 pounds in the past few months.   Past Medical History:  Diagnosis Date  . Cancer ([place])    Tonsil cancer  . Carotid artery stenosis without cerebral infarction 08/30/2015  . Hypertension   . Hypothyroidism 05/09/2014  . Nausea in adult 08/10/2015  . Other headache syndrome 08/10/2015  . Thyroid disease   . Weight loss 08/08/2015    Patient Active Problem List   Diagnosis Date Noted  . Diaphoresis 11/22/2017  . Shoulder pain 11/22/2017  . Hyponatremia 11/22/2017  . Chest pain 11/21/2017  . Esophageal obstruction due to food impaction   . Left carotid stenosis 08/02/2016  . Carotid disease, bilateral ([place]) 09/14/2015  . Carotid artery stenosis without cerebral infarction 08/30/2015  . Other headache syndrome 08/10/2015  . Nausea in adult 08/10/2015  .  Weight loss 08/08/2015  . Hypothyroidism 05/09/2014  . Essential hypertension 05/09/2014  . Tonsil cancer ([place]) 05/04/2013    Past Surgical History:  Procedure Laterality Date  . BACK SURGERY    . CAROTID PTA/STENT INTERVENTION Left 08/02/2016   Procedure: Carotid PTA/Stent Intervention;  Surgeon: Elam Dutch, MD;  Location: [facility];  Service: Cardiovascular;  Laterality: Left;  . ESOPHAGOGASTRODUODENOSCOPY N/A 11/15/2016   Procedure: ESOPHAGOGASTRODUODENOSCOPY (EGD);  Surgeon: Doran Stabler, MD;  Location: [facility];  Service: Endoscopy;  Laterality: N/A;  . FOREIGN BODY REMOVAL N/A 11/15/2016   Procedure: FOREIGN BODY REMOVAL;  Surgeon: Doran Stabler, MD;  Location: [facility];  Service: Endoscopy;  Laterality: N/A;  . PERIPHERAL VASCULAR CATHETERIZATION N/A 07/04/2016   Procedure: Aortic Arch Angiography;  Surgeon: Conrad Mebane, MD;  Location: [facility];  Service: Cardiovascular;  Laterality: N/A;  . PERIPHERAL VASCULAR CATHETERIZATION N/A 07/04/2016   Procedure: Carotid & cerebral  Angiography;  Surgeon: Conrad Kingston, MD;  Location: [facility];  Service: Cardiovascular;  Laterality: N/A;  . TUMOR REMOVAL     in neck        Home Medications    Prior to Admission medications   Medication Sig Start Date End Date Taking? Authorizing Provider  acetaminophen (TYLENOL) 500 MG tablet Take 500-1,000 mg by mouth every 6 (six) hours as needed (for pain or headaches).     [provider]  amLODipine (NORVASC) 10 MG tablet Take 1 tablet (10 mg total) by mouth daily. 08/10/15   Heath Lark, MD  aspirin 81 MG chewable tablet  Chew 81 mg by mouth every evening.     [provider]  clopidogrel (PLAVIX) 75 MG tablet Take 1 tablet (75 mg total) by mouth daily. 07/11/16   Elam Dutch, MD  diclofenac sodium (VOLTAREN) 1 % GEL Apply 2 g topically See admin instructions. Apply 2 grams to painful areas as needed/as directed for joint pain     [provider]  DULoxetine (CYMBALTA) 30 MG capsule Take 30 mg by mouth daily.     [provider]  levothyroxine (SYNTHROID, LEVOTHROID) 75 MCG tablet Take 75 mcg by mouth daily before breakfast.     [provider]  lisinopril (PRINIVIL,ZESTRIL) 5 MG tablet Take 1 tablet (5 mg total) by mouth daily. Patient taking differently: Take 5 mg by mouth every evening.  08/10/15   Heath Lark, MD  OVER THE COUNTER MEDICATION TheraTears Liquid Eye Drops- Place 1-2 drops into both eyes one to three times as day as needed for dryness or irritation    [provider]    Family History Family History  Problem Relation Age of Onset  . Cancer Father        lung cancer  . COPD Mother   . Macular degeneration Mother     Social History Social History   Tobacco Use  . Smoking status: Former Smoker    Packs/day: 1.00    Years: 25.00    Pack years: 25.00    Last attempt to quit: 05/04/2001    Years since quitting: 16.5  . Smokeless tobacco: Never Used  Substance Use Topics  . Alcohol use: Yes    Alcohol/week: 1.2 oz    Types: 2 Shots of liquor per week    Comment: rarely  . Drug use: No     Allergies   Patient has no known allergies.   Review of Systems Review of Systems  Constitutional:       Per HPI, otherwise negative  HENT:       Per HPI, otherwise negative  Respiratory:       Per HPI, otherwise negative  Cardiovascular:       Per HPI, otherwise negative  Gastrointestinal: Negative for vomiting.  Endocrine:       Negative aside from HPI  Genitourinary:       Neg aside from HPI   Musculoskeletal:       Per HPI, otherwise negative  Skin: Negative.   Neurological: Positive for dizziness and light-headedness. Negative for syncope.     Physical Exam Updated Vital Signs BP (!) 154/64 (BP Location: Right Arm)   Pulse 65   Temp 98 F (36.7 C) (Oral)   Resp 15   Ht 5\' 8"  (1.727 m)   Wt 71.2 kg (157 lb)   SpO2 99%   BMI 23.87 kg/m     Physical Exam  Constitutional: He is oriented to person, place, and time. He appears well-developed. No distress.  HENT:  Head: Normocephalic and atraumatic.  Eyes: Conjunctivae and EOM are normal.  Cardiovascular: Normal rate and regular rhythm.  Pulmonary/Chest: Effort normal. No stridor. No respiratory distress.  Abdominal: He exhibits no distension.  Musculoskeletal: He exhibits no edema.  Neurological: He is alert and oriented to person, place, and time. He displays atrophy. He displays no tremor. He exhibits normal muscle tone. He displays no seizure activity. Coordination normal.  Skin: Skin is warm and dry.  Psychiatric: He has a normal mood and affect.  Nursing note and vitals reviewed.    ED  Treatments / Results  Labs (all labs ordered are listed, but only abnormal results are displayed) Labs Reviewed  BASIC METABOLIC PANEL - Abnormal; Notable for the following components:      Result Value   Sodium 134 (*)    Chloride 99 (*)    All other components within normal limits  CBC - Abnormal; Notable for the following components:   WBC 3.2 (*)    RBC 4.01 (*)    HCT 37.7 (*)    All other components within normal limits  TSH  T4, FREE  I-STAT TROPONIN, ED  CBG MONITORING, ED    EKG EKG Interpretation  Date/Time:  Wednesday December 03 2017 11:40:13 EDT Ventricular Rate:  77 PR Interval:  160 QRS Duration: 146 QT Interval:  422 QTC Calculation: 477 R Axis:   64 Text Interpretation:  Normal sinus rhythm Right bundle branch block No significant change since last tracing Abnormal ekg Confirmed by Carmin Muskrat [phone]) on 12/03/2017 12:33:20 PM   Radiology Dg Chest 2 View  Result Date: 12/03/2017 CLINICAL DATA:  Diaphoresis, headache, and weakness intermittently in the mornings for the past 2 weeks. History of stent placement in 2018. No chest pain or nausea or vomiting. Former smoker. EXAM: CHEST - 2 VIEW COMPARISON:  PA and lateral chest x-ray of November 21, 2017  FINDINGS: The lungs are mildly hyperinflated with hemidiaphragm flattening. The heart and pulmonary vascularity are normal. There is calcification in the wall of the aortic arch. The bony thorax exhibits no acute abnormality. IMPRESSION: Chronic bronchitic changes, stable. No pneumonia, CHF, nor other acute cardiopulmonary abnormality. Thoracic aortic atherosclerosis. Electronically Signed   By: David  Martinique M.D.   On: 12/03/2017 13:07    Procedures Procedures (including critical care time)  Echo from earlier this month as below: Transthoracic Echocardiography   Patient:    Darrow, Barreiro MR #:       [id number] Study Date: 11/22/2017 Gender:     M Age:        60 Height:     172.7 cm Weight:     71.2 kg BSA:        1.85 m^2 Pt. Status: Room:       6E11C  SONOGRAPHER  Johny Chess, RDCS, CCT  PERFORMING   [facility], Inpatient  ATTENDING    Tegeler, Evelena Asa  ADMITTING    Tamala Julian, Rondell A  ORDERING     Smith, Rondell A   cc:   ------------------------------------------------------------------- LV EF: 55% -   60%    Initial Impression / Assessment and Plan / ED Course  I have reviewed the triage vital signs and the nursing notes.  Pertinent labs & imaging results that were available during my care of the patient were reviewed by me and considered in my medical decision making (see chart for details).    Chart review notable for evaluation here last week, with reassuring findings.  2:52 PM On repeat exam the patient is awake, alert, in no distress, we had a lengthy conversation about all findings, both today, and his recent evaluation here, and echocardiogram. With no ongoing complaints, reassuring labs, no evidence for endocrinologic, metabolic or ischemic pathology, the patient may be expensing medication effects, particularly given the timing of his symptoms after breakfast and dosing. With these reassuring findings, vitals, the patient will follow up with his physician as  scheduled next week.  Final Clinical Impressions(s) / ED Diagnoses  Weakness   Carmin Muskrat, MD 12/03/17 1453

## 2017-12-03 NOTE — ED Triage Notes (Addendum)
Pt endorses diaphoresis and dizziness/weakness with right flank pain that began yesterday. Seen here and admitted 06/07 for the same and had full cardiac work up and told "everything was negative" VSS.  Pt states that these episodes seem to happen 1 hour after breakfast and taking his blood thinner. No neuro deficits.

## 2017-12-03 NOTE — Discharge Instructions (Addendum)
As discussed, your evaluation today has been largely reassuring.  But, it is important that you monitor your condition carefully, and do not hesitate to return to the ED if you develop new, or concerning changes in your condition. ? ?Otherwise, please follow-up with your physician for appropriate ongoing care. ? ?

## 2018-03-17 ENCOUNTER — Other Ambulatory Visit (HOSPITAL_COMMUNITY): Payer: Self-pay | Admitting: Internal Medicine

## 2018-03-17 DIAGNOSIS — N281 Cyst of kidney, acquired: Secondary | ICD-10-CM

## 2018-03-19 ENCOUNTER — Ambulatory Visit (HOSPITAL_COMMUNITY)
Admission: RE | Admit: 2018-03-19 | Discharge: 2018-03-19 | Disposition: A | Discharge status: Home / Self Care | Payer: No Typology Code available for payment source | Source: Ambulatory Visit | Attending: Internal Medicine | Admitting: Internal Medicine

## 2018-03-19 DIAGNOSIS — N281 Cyst of kidney, acquired: Secondary | ICD-10-CM | POA: Insufficient documentation

## 2019-10-14 NOTE — Patient Instructions (Addendum)
DUE TO COVID-19 ONLY ONE VISITOR IS ALLOWED TO COME WITH YOU AND STAY IN THE WAITING ROOM ONLY DURING PRE OP AND PROCEDURE DAY OF SURGERY. THE 1 VISITOR MAY VISIT WITH YOU AFTER SURGERY IN YOUR PRIVATE ROOM DURING VISITING HOURS ONLY!  YOU NEED TO HAVE A COVID 19 TEST ON: 10/22/19 @ 9:20 am   , THIS TEST MUST BE DONE BEFORE SURGERY, COME  [place], [address] , [id number].  ([place]) ONCE YOUR COVID TEST IS COMPLETED, PLEASE BEGIN THE QUARANTINE INSTRUCTIONS AS OUTLINED IN YOUR HANDOUT.                Elyn Aquas Eckstrom     Your procedure is scheduled on: 10/26/19   Report to [facility] Main  Entrance   Report to admitting at: 9:00 AM     Call this number if you have problems the morning of surgery [phone]    Remember:  NO SOLID FOOD AFTER MIDNIGHT THE NIGHT PRIOR TO SURGERY. NOTHING BY MOUTH EXCEPT CLEAR LIQUIDS UNTIL: 8:30 am . PLEASE FINISH ENSURE DRINK PER SURGEON ORDER  WHICH NEEDS TO BE COMPLETED AT: 8:30 am .   CLEAR LIQUID DIET   Foods Allowed                                                                     Foods Excluded  Coffee and tea, regular and decaf                             liquids that you cannot  Plain Jell-O any favor except red or purple                                           see through such as: Fruit ices (not with fruit pulp)                                     milk, soups, orange juice  Iced Popsicles                                    All solid food Carbonated beverages, regular and diet                                    Cranberry, grape and apple juices Sports drinks like Gatorade Lightly seasoned clear broth or consume(fat free) Sugar, honey syrup  Sample Menu Breakfast                                Lunch                                     Supper Cranberry juice  Beef broth                            Chicken broth Jell-O                                     Grape juice                            Apple juice Coffee or tea                        Jell-O                                      Popsicle                                                Coffee or tea                        Coffee or tea  _____________________________________________________________________   BRUSH YOUR TEETH MORNING OF SURGERY AND RINSE YOUR MOUTH OUT, NO CHEWING GUM CANDY OR MINTS.     Take these medicines the morning of surgery with A SIP OF WATER: amlodipine,levothyroxine.                                 You may not have any metal on your body including hair pins and              piercings  Do not wear jewelry,lotions, powders or perfumes, deodorant             Men may shave face and neck.   Do not bring valuables to the hospital. [place].  Contacts, dentures or bridgework may not be worn into surgery.  Leave suitcase in the car. After surgery it may be brought to your room.     Patients discharged the day of surgery will not be allowed to drive home. IF YOU ARE HAVING SURGERY AND GOING HOME THE SAME DAY, YOU MUST HAVE AN ADULT TO DRIVE YOU HOME AND BE WITH YOU FOR 24 HOURS. YOU MAY GO HOME BY TAXI OR UBER OR ORTHERWISE, BUT AN ADULT MUST ACCOMPANY YOU HOME AND STAY WITH YOU FOR 24 HOURS.  Name and phone number of your driver:  Special Instructions: N/A              Please read over the following fact sheets you were given: _____________________________________________________________________             [facility] - Preparing for Surgery Before surgery, you can play an important role.  Because skin is not sterile, your skin needs to be as free of germs as possible.  You can reduce the number of germs on your skin by washing with CHG (chlorahexidine gluconate) soap before surgery.  CHG is an antiseptic cleaner which kills germs and bonds with the  skin to continue killing germs even after washing. Please DO NOT use if you have an allergy to  CHG or antibacterial soaps.  If your skin becomes reddened/irritated stop using the CHG and inform your nurse when you arrive at Short Stay. Do not shave (including legs and underarms) for at least 48 hours prior to the first CHG shower.  You may shave your face/neck. Please follow these instructions carefully:  1.  Shower with CHG Soap the night before surgery and the  morning of Surgery.  2.  If you choose to wash your hair, wash your hair first as usual with your  normal  shampoo.  3.  After you shampoo, rinse your hair and body thoroughly to remove the  shampoo.                           4.  Use CHG as you would any other liquid soap.  You can apply chg directly  to the skin and wash                       Gently with a scrungie or clean washcloth.  5.  Apply the CHG Soap to your body ONLY FROM THE NECK DOWN.   Do not use on face/ open                           Wound or open sores. Avoid contact with eyes, ears mouth and genitals (private parts).                       Wash face,  Genitals (private parts) with your normal soap.             6.  Wash thoroughly, paying special attention to the area where your surgery  will be performed.  7.  Thoroughly rinse your body with warm water from the neck down.  8.  DO NOT shower/wash with your normal soap after using and rinsing off  the CHG Soap.                9.  Pat yourself dry with a clean towel.            10.  Wear clean pajamas.            11.  Place clean sheets on your bed the night of your first shower and do not  sleep with pets. Day of Surgery : Do not apply any lotions/deodorants the morning of surgery.  Please wear clean clothes to the hospital/surgery center.  FAILURE TO FOLLOW THESE INSTRUCTIONS MAY RESULT IN THE CANCELLATION OF YOUR SURGERY PATIENT SIGNATURE_________________________________  NURSE SIGNATURE__________________________________  ________________________________________________________________________   Adam Phenix  An incentive spirometer is a tool that can help keep your lungs clear and active. This tool measures how well you are filling your lungs with each breath. Taking long deep breaths may help reverse or decrease the chance of developing breathing (pulmonary) problems (especially infection) following:  A long period of time when you are unable to move or be active. BEFORE THE PROCEDURE   If the spirometer includes an indicator to show your best effort, your nurse or respiratory therapist will set it to a desired goal.  If possible, sit up straight or lean slightly forward. Try not to slouch.  Hold the incentive spirometer in an upright position. INSTRUCTIONS FOR USE  1. Sit on the edge of your bed if possible, or sit up as far as you can in bed or on a chair. 2. Hold the incentive spirometer in an upright position. 3. Breathe out normally. 4. Place the mouthpiece in your mouth and seal your lips tightly around it. 5. Breathe in slowly and as deeply as possible, raising the piston or the ball toward the top of the column. 6. Hold your breath for 3-5 seconds or for as long as possible. Allow the piston or ball to fall to the bottom of the column. 7. Remove the mouthpiece from your mouth and breathe out normally. 8. Rest for a few seconds and repeat Steps 1 through 7 at least 10 times every 1-2 hours when you are awake. Take your time and take a few normal breaths between deep breaths. 9. The spirometer may include an indicator to show your best effort. Use the indicator as a goal to work toward during each repetition. 10. After each set of 10 deep breaths, practice coughing to be sure your lungs are clear. If you have an incision (the cut made at the time of surgery), support your incision when coughing by placing a pillow or rolled up towels firmly against it. Once you are able to get out of bed, walk around indoors and cough well. You may stop using the incentive spirometer when  instructed by your caregiver.  RISKS AND COMPLICATIONS  Take your time so you do not get dizzy or light-headed.  If you are in pain, you may need to take or ask for pain medication before doing incentive spirometry. It is harder to take a deep breath if you are having pain. AFTER USE  Rest and breathe slowly and easily.  It can be helpful to keep track of a log of your progress. Your caregiver can provide you with a simple table to help with this. If you are using the spirometer at home, follow these instructions: [place] IF:   You are having difficultly using the spirometer.  You have trouble using the spirometer as often as instructed.  Your pain medication is not giving enough relief while using the spirometer.  You develop fever of 100.5 F (38.1 C) or higher. SEEK IMMEDIATE MEDICAL CARE IF:   You cough up bloody sputum that had not been present before.  You develop fever of 102 F (38.9 C) or greater.  You develop worsening pain at or near the incision site. MAKE SURE YOU:   Understand these instructions.  Will watch your condition.  Will get help right away if you are not doing well or get worse. Document Released: 10/14/2006 Document Revised: 08/26/2011 Document Reviewed: 12/15/2006 [facility] Patient Information 2014 [place], [place].   ________________________________________________________________________

## 2019-10-15 ENCOUNTER — Encounter (HOSPITAL_COMMUNITY)
Admission: RE | Admit: 2019-10-15 | Discharge: 2019-10-15 | Disposition: A | Discharge status: Home / Self Care | Payer: Medicare Other | Source: Ambulatory Visit | Attending: Orthopedic Surgery | Admitting: Orthopedic Surgery

## 2019-10-15 ENCOUNTER — Other Ambulatory Visit: Payer: Self-pay

## 2019-10-15 ENCOUNTER — Encounter (HOSPITAL_COMMUNITY): Payer: Self-pay

## 2019-10-15 DIAGNOSIS — Z01818 Encounter for other preprocedural examination: Secondary | ICD-10-CM | POA: Diagnosis not present

## 2019-10-15 HISTORY — DX: Unspecified osteoarthritis, unspecified site: M19.90

## 2019-10-15 LAB — CBC
HCT: 35.5 % — ABNORMAL LOW (ref 39.0–52.0)
Hemoglobin: 11.9 g/dL — ABNORMAL LOW (ref 13.0–17.0)
MCH: 32.4 pg (ref 26.0–34.0)
MCHC: 33.5 g/dL (ref 30.0–36.0)
MCV: 96.7 fL (ref 80.0–100.0)
Platelets: 260 10*3/uL (ref 150–400)
RBC: 3.67 MIL/uL — ABNORMAL LOW (ref 4.22–5.81)
RDW: 13.4 % (ref 11.5–15.5)
WBC: 4.3 10*3/uL (ref 4.0–10.5)
nRBC: 0 % (ref 0.0–0.2)

## 2019-10-15 LAB — BASIC METABOLIC PANEL
Anion gap: 8 (ref 5–15)
BUN: 14 mg/dL (ref 8–23)
CO2: 27 mmol/L (ref 22–32)
Calcium: 9 mg/dL (ref 8.9–10.3)
Chloride: 100 mmol/L (ref 98–111)
Creatinine, Ser: 0.59 mg/dL — ABNORMAL LOW (ref 0.61–1.24)
GFR calc Af Amer: >60 mL/min (ref >60)
GFR calc non Af Amer: >60 mL/min (ref >60)
Glucose, Bld: 92 mg/dL (ref 70–99)
Potassium: 4.5 mmol/L (ref 3.5–5.1)
Sodium: 135 mmol/L (ref 135–145)

## 2019-10-15 LAB — TYPE AND SCREEN
ABO/RH(D): B POS
Antibody Screen: NEGATIVE

## 2019-10-15 LAB — SURGICAL PCR SCREEN
MRSA, PCR: NEGATIVE
Staphylococcus aureus: NEGATIVE

## 2019-10-15 NOTE — Progress Notes (Addendum)
PCP - Dr. Anselmo Pickler Cardiologist -   Chest x-ray -  EKG -  Stress Test -  ECHO - 11/22/17 EPIC Cardiac Cath -   Sleep Study -  CPAP -   Fasting Blood Sugar -  Checks Blood Sugar _____ times a day  Blood Thinner Instructions:RN recommended pt. To call MD. For instructions on Brilinta and aspirin. Aspirin Instructions: Last Dose:  Anesthesia review:   Patient denies shortness of breath, fever, cough and chest pain at PAT appointment   Patient verbalized understanding of instructions that were given to them at the PAT appointment. Patient was also instructed that they will need to review over the PAT instructions again at home before surgery.

## 2019-10-16 LAB — ABO/RH: ABO/RH(D): B POS

## 2019-10-20 NOTE — H&P (Signed)
TOTAL HIP ADMISSION H&P  Patient is admitted for left total hip arthroplasty, anterior approach.  Subjective:  Chief Complaint: Left hip primary OA / pain  HPI: Alexander Duran, 74 y.o. male, has a history of pain and functional disability in the left hip(s) due to arthritis and patient has failed non-surgical conservative treatments for greater than 12 weeks to include NSAID's and/or analgesics and activity modification.  Onset of symptoms was gradual starting 2+ years ago with gradually worsening course since that time.The patient noted no past surgery on the left hip(s).  Patient currently rates pain in the left hip at 9 out of 10 with activity. Patient has night pain, worsening of pain with activity and weight bearing, trendelenberg gait, pain that interfers with activities of daily living and pain with passive range of motion. Patient has evidence of periarticular osteophytes and joint space narrowing by imaging studies. This condition presents safety issues increasing the risk of falls.   There is no current active infection.  Risks, benefits and expectations were discussed with the patient.  Risks including but not limited to the risk of anesthesia, blood clots, nerve damage, blood vessel damage, failure of the prosthesis, infection and up to and including death.  Patient understand the risks, benefits and expectations and wishes to proceed with surgery.   PCP: Windy Fast, MD  D/C Plans:       Home   Post-op Meds:       No Rx given  Tranexamic Acid:      To be given - IV   Decadron:      Is to be given  FYI:      Plavix & ASA (on pre-op)  Norco  DME:   Pt Rx given for - [facility]  And [facility])  PT:   HEP  Pharmacy: [facility]   Patient Active Problem List   Diagnosis Date Noted  . Diaphoresis 11/22/2017  . Shoulder pain 11/22/2017  . Hyponatremia 11/22/2017  . Chest pain 11/21/2017  . Esophageal obstruction due to food impaction   . Left carotid stenosis 08/02/2016  . Carotid  disease, bilateral ([place]) 09/14/2015  . Carotid artery stenosis without cerebral infarction 08/30/2015  . Other headache syndrome 08/10/2015  . Nausea in adult 08/10/2015  . Weight loss 08/08/2015  . Hypothyroidism 05/09/2014  . Essential hypertension 05/09/2014  . Tonsil cancer ([place]) 05/04/2013   Past Medical History:  Diagnosis Date  . Arthritis   . Cancer ([place])    Tonsil cancer  . Carotid artery stenosis without cerebral infarction 08/30/2015  . Hypertension   . Hypothyroidism 05/09/2014  . Nausea in adult 08/10/2015  . Other headache syndrome 08/10/2015  . Thyroid disease   . Weight loss 08/08/2015    Past Surgical History:  Procedure Laterality Date  . BACK SURGERY    . CAROTID PTA/STENT INTERVENTION Left 08/02/2016   Procedure: Carotid PTA/Stent Intervention;  Surgeon: Elam Dutch, MD;  Location: [place] [facility];  Service: Cardiovascular;  Laterality: Left;  . ESOPHAGOGASTRODUODENOSCOPY N/A 11/15/2016   Procedure: ESOPHAGOGASTRODUODENOSCOPY (EGD);  Surgeon: Doran Stabler, MD;  Location: [facility] ENDOSCOPY;  Service: Endoscopy;  Laterality: N/A;  . FOREIGN BODY REMOVAL N/A 11/15/2016   Procedure: FOREIGN BODY REMOVAL;  Surgeon: Doran Stabler, MD;  Location: [place];  Service: Endoscopy;  Laterality: N/A;  . PERIPHERAL VASCULAR CATHETERIZATION N/A 07/04/2016   Procedure: Aortic Arch Angiography;  Surgeon: Conrad Coburn, MD;  Location: [place] [facility];  Service: Cardiovascular;  Laterality: N/A;  . PERIPHERAL VASCULAR CATHETERIZATION N/A 07/04/2016   Procedure: Carotid & cerebral  Angiography;  Surgeon: Conrad Dodge, MD;  Location: [place] [facility];  Service: Cardiovascular;  Laterality: N/A;  . TUMOR REMOVAL     in neck    No current facility-administered medications for this encounter.   Current Outpatient Medications  Medication Sig Dispense Refill Last Dose  . acetaminophen (TYLENOL) 500 MG tablet Take 500-1,000 mg by mouth every 6 (six) hours as needed for  moderate pain or headache.      Marland Kitchen amLODipine (NORVASC) 5 MG tablet Take 5 mg by mouth daily.     Marland Kitchen aspirin 81 MG chewable tablet Chew 81 mg by mouth every evening.      Marland Kitchen atorvastatin (LIPITOR) 20 MG tablet Take 20 mg by mouth daily.     . diclofenac sodium (VOLTAREN) 1 % GEL Apply 2 g topically daily as needed (joint pain).      Marland Kitchen levothyroxine (SYNTHROID, LEVOTHROID) 75 MCG tablet Take 75 mcg by mouth daily before breakfast.      . OVER THE COUNTER MEDICATION TheraTears Liquid Eye Drops- Place 1-2 drops into both eyes one to three times as day as needed for dryness or irritation     . ticagrelor (BRILINTA) 60 MG TABS tablet Take 60 mg by mouth daily.     Marland Kitchen amLODipine (NORVASC) 10 MG tablet Take 1 tablet (10 mg total) by mouth daily. (Patient not taking: Reported on 10/05/2019) 30 tablet 6 Not Taking at Unknown time  . clopidogrel (PLAVIX) 75 MG tablet Take 1 tablet (75 mg total) by mouth daily. (Patient not taking: Reported on 10/05/2019) 30 tablet 6 Not Taking at Unknown time  . lisinopril (PRINIVIL,ZESTRIL) 5 MG tablet Take 1 tablet (5 mg total) by mouth daily. (Patient not taking: Reported on 10/05/2019) 30 tablet 1 Not Taking at Unknown time   No Known Allergies   Social History   Tobacco Use  . Smoking status: Former Smoker    Packs/day: 1.00    Years: 25.00    Pack years: 25.00    Quit date: 05/04/2001    Years since quitting: 18.4  . Smokeless tobacco: Never Used  Substance Use Topics  . Alcohol use: Yes    Alcohol/week: 2.0 standard drinks    Types: 2 Shots of liquor per week    Comment: rarely    Family History  Problem Relation Age of Onset  . Cancer Father        lung cancer  . COPD Mother   . Macular degeneration Mother      Review of Systems  Constitutional: Negative.   HENT: Negative.   Eyes: Negative.   Respiratory: Negative.   Cardiovascular: Negative.   Gastrointestinal: Negative.   Genitourinary: Negative.   Musculoskeletal: Positive for joint pain.   Skin: Negative.   Neurological: Negative.   Endo/Heme/Allergies: Negative.   Psychiatric/Behavioral: Negative.       Objective:  Physical Exam  Constitutional: He is oriented to person, place, and time. He appears well-developed.  HENT:  Head: Normocephalic.  Eyes: Pupils are equal, round, and reactive to light.  Neck: No JVD present. No tracheal deviation present. No thyromegaly present.  Cardiovascular: Normal rate, regular rhythm and intact distal pulses.  Respiratory: Effort normal and breath sounds normal. No respiratory distress. He has no wheezes.  GI: Soft. There is no abdominal tenderness. There is no guarding.  Musculoskeletal:     Cervical back: Neck supple.  Left hip: Tenderness and bony tenderness present. No swelling, deformity or lacerations. Decreased range of motion. Decreased strength.  Lymphadenopathy:    He has no cervical adenopathy.  Neurological: He is alert and oriented to person, place, and time. 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  Skin: Skin is warm and dry.  Psychiatric: He has a normal mood and affect.      Labs:  Estimated body mass index is 22.37 kg/m as calculated from the following:   Height as of 10/15/19: 5\' 8"  (1.727 m).   Weight as of 10/15/19: 66.7 kg.   Imaging Review Plain radiographs demonstrate severe degenerative joint disease of the left hip(s). The bone quality appears to be good for age and reported activity level.      Assessment/Plan:  End stage arthritis, left hip(s)  The patient history, physical examination, clinical judgement of the provider and imaging studies are consistent with end stage degenerative joint disease of the left hip(s) and total hip arthroplasty is deemed medically necessary. The treatment options including medical management, injection therapy, arthroscopy and arthroplasty were discussed at length. The risks and benefits of total hip arthroplasty were presented and  reviewed. The risks due to aseptic loosening, infection, stiffness, dislocation/subluxation,  thromboembolic complications and other imponderables were discussed.  The patient acknowledged the explanation, agreed to proceed with the plan and consent was signed. Patient is being admitted for inpatient treatment for surgery, pain control, PT, OT, prophylactic antibiotics, VTE prophylaxis, progressive ambulation and ADL's and discharge planning.The patient is planning to be discharged home.    [place] Rayvon Dakin   PA-C  10/20/2019, 9:43 PM

## 2019-10-22 ENCOUNTER — Other Ambulatory Visit (HOSPITAL_COMMUNITY)
Admission: RE | Admit: 2019-10-22 | Discharge: 2019-10-22 | Disposition: A | Discharge status: Home / Self Care | Payer: Medicare Other | Source: Ambulatory Visit | Attending: Orthopedic Surgery | Admitting: Orthopedic Surgery

## 2019-10-22 DIAGNOSIS — Z20822 Contact with and (suspected) exposure to covid-19: Secondary | ICD-10-CM | POA: Diagnosis not present

## 2019-10-22 DIAGNOSIS — Z01812 Encounter for preprocedural laboratory examination: Secondary | ICD-10-CM | POA: Insufficient documentation

## 2019-10-22 LAB — SARS CORONAVIRUS 2 (TAT 6-24 HRS): SARS Coronavirus 2: NEGATIVE

## 2019-10-26 ENCOUNTER — Ambulatory Visit (HOSPITAL_COMMUNITY): Payer: No Typology Code available for payment source

## 2019-10-26 ENCOUNTER — Encounter (HOSPITAL_COMMUNITY)
Admission: RE | Disposition: A | Discharge status: Home / Self Care | Payer: Self-pay | Source: Ambulatory Visit | Attending: Orthopedic Surgery

## 2019-10-26 ENCOUNTER — Ambulatory Visit (HOSPITAL_COMMUNITY): Payer: No Typology Code available for payment source | Admitting: Anesthesiology

## 2019-10-26 ENCOUNTER — Observation Stay (HOSPITAL_COMMUNITY)
Admission: RE | Admit: 2019-10-26 | Discharge: 2019-10-28 | Disposition: A | Discharge status: Home / Self Care | Payer: No Typology Code available for payment source | Source: Ambulatory Visit | Attending: Orthopedic Surgery | Admitting: Orthopedic Surgery

## 2019-10-26 ENCOUNTER — Observation Stay (HOSPITAL_COMMUNITY): Payer: No Typology Code available for payment source

## 2019-10-26 ENCOUNTER — Ambulatory Visit (HOSPITAL_COMMUNITY): Payer: No Typology Code available for payment source | Admitting: Physician Assistant

## 2019-10-26 ENCOUNTER — Encounter (HOSPITAL_COMMUNITY): Payer: Self-pay | Admitting: Orthopedic Surgery

## 2019-10-26 ENCOUNTER — Other Ambulatory Visit: Payer: Self-pay

## 2019-10-26 DIAGNOSIS — Z7982 Long term (current) use of aspirin: Secondary | ICD-10-CM | POA: Insufficient documentation

## 2019-10-26 DIAGNOSIS — E039 Hypothyroidism, unspecified: Secondary | ICD-10-CM | POA: Insufficient documentation

## 2019-10-26 DIAGNOSIS — I6523 Occlusion and stenosis of bilateral carotid arteries: Secondary | ICD-10-CM | POA: Insufficient documentation

## 2019-10-26 DIAGNOSIS — Z87891 Personal history of nicotine dependence: Secondary | ICD-10-CM | POA: Diagnosis not present

## 2019-10-26 DIAGNOSIS — Z7902 Long term (current) use of antithrombotics/antiplatelets: Secondary | ICD-10-CM | POA: Insufficient documentation

## 2019-10-26 DIAGNOSIS — Z79890 Hormone replacement therapy (postmenopausal): Secondary | ICD-10-CM | POA: Diagnosis not present

## 2019-10-26 DIAGNOSIS — Z79899 Other long term (current) drug therapy: Secondary | ICD-10-CM | POA: Diagnosis not present

## 2019-10-26 DIAGNOSIS — Z96649 Presence of unspecified artificial hip joint: Secondary | ICD-10-CM

## 2019-10-26 DIAGNOSIS — R262 Difficulty in walking, not elsewhere classified: Secondary | ICD-10-CM | POA: Diagnosis not present

## 2019-10-26 DIAGNOSIS — M1612 Unilateral primary osteoarthritis, left hip: Secondary | ICD-10-CM | POA: Diagnosis present

## 2019-10-26 DIAGNOSIS — G4730 Sleep apnea, unspecified: Secondary | ICD-10-CM | POA: Insufficient documentation

## 2019-10-26 DIAGNOSIS — Z96642 Presence of left artificial hip joint: Secondary | ICD-10-CM

## 2019-10-26 DIAGNOSIS — I10 Essential (primary) hypertension: Secondary | ICD-10-CM | POA: Diagnosis not present

## 2019-10-26 DIAGNOSIS — Z419 Encounter for procedure for purposes other than remedying health state, unspecified: Secondary | ICD-10-CM

## 2019-10-26 HISTORY — PX: TOTAL HIP ARTHROPLASTY: SHX124

## 2019-10-26 HISTORY — DX: Personal history of irradiation: Z92.3

## 2019-10-26 HISTORY — DX: Dysphagia, unspecified: R13.10

## 2019-10-26 HISTORY — DX: Sleep apnea, unspecified: G47.30

## 2019-10-26 IMAGING — RF DG HIP (WITH PELVIS) OPERATIVE*L*
1 series · 2 of 2 positions shown · non-contrast
Comparison: None.

CLINICAL DATA: Left hip replacement

EXAM:
OPERATIVE LEFT HIP (WITH PELVIS IF PERFORMED) 2 VIEWS
TECHNIQUE: Fluoroscopic spot image(s) were submitted for interpretation
post-operatively.

[Series 1: unknown protocol · 0.20mm/px · 2 of 2 slices shown]
[im 1/2]
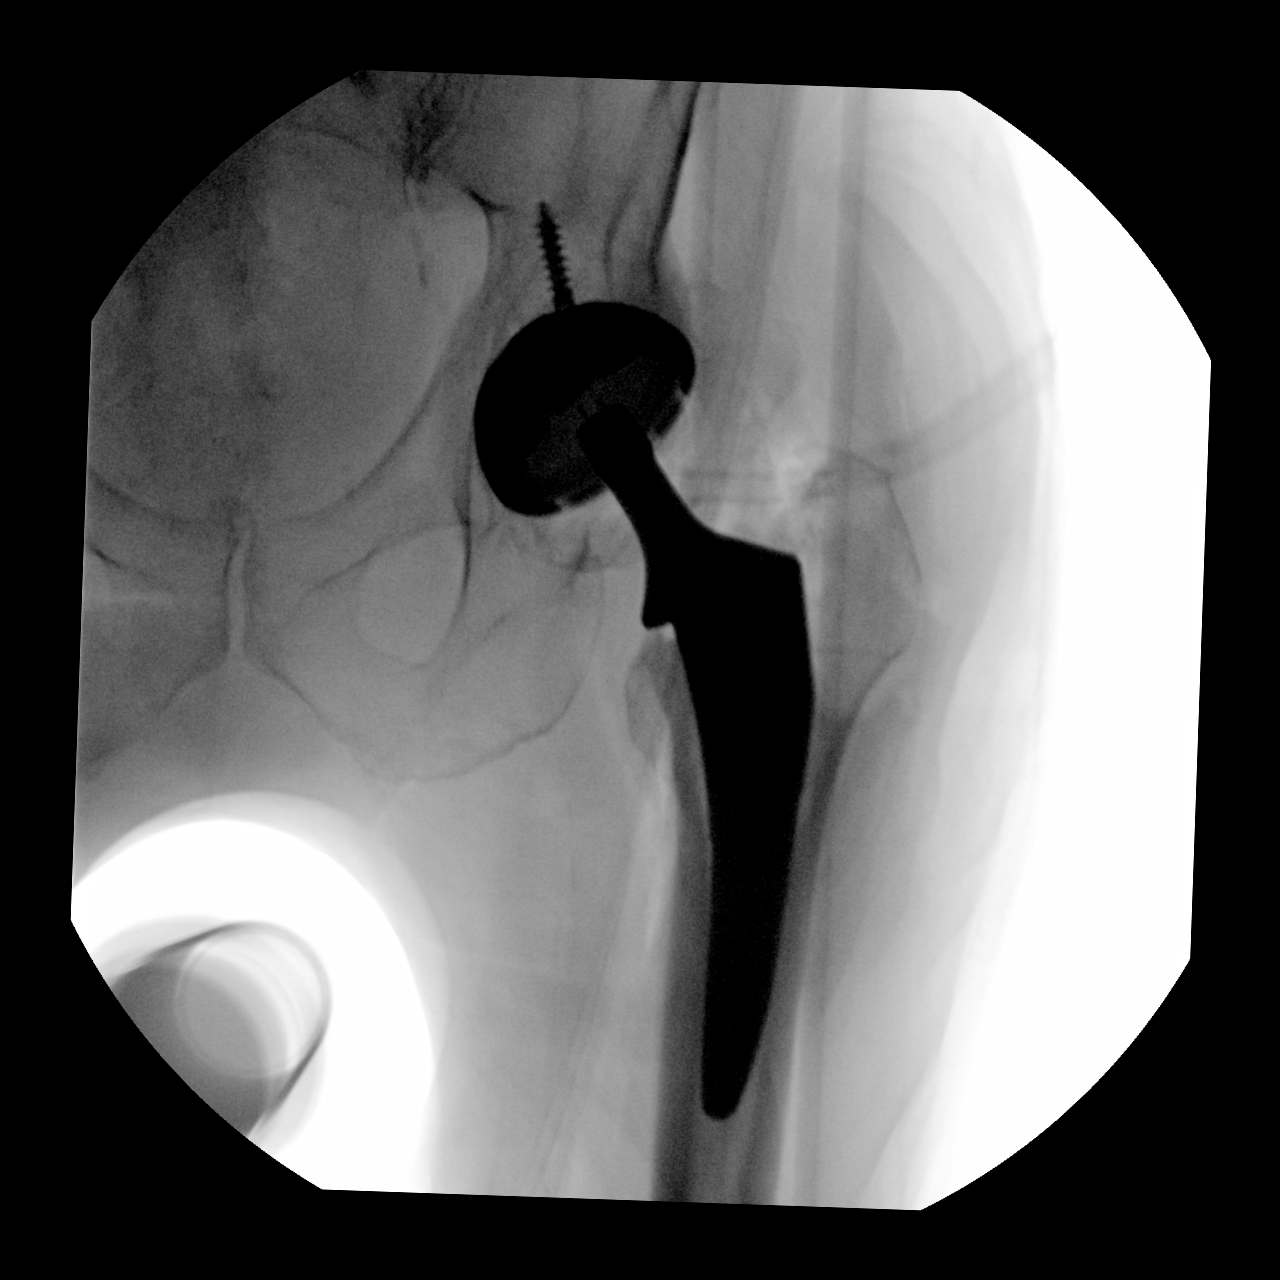
[im 2/2]
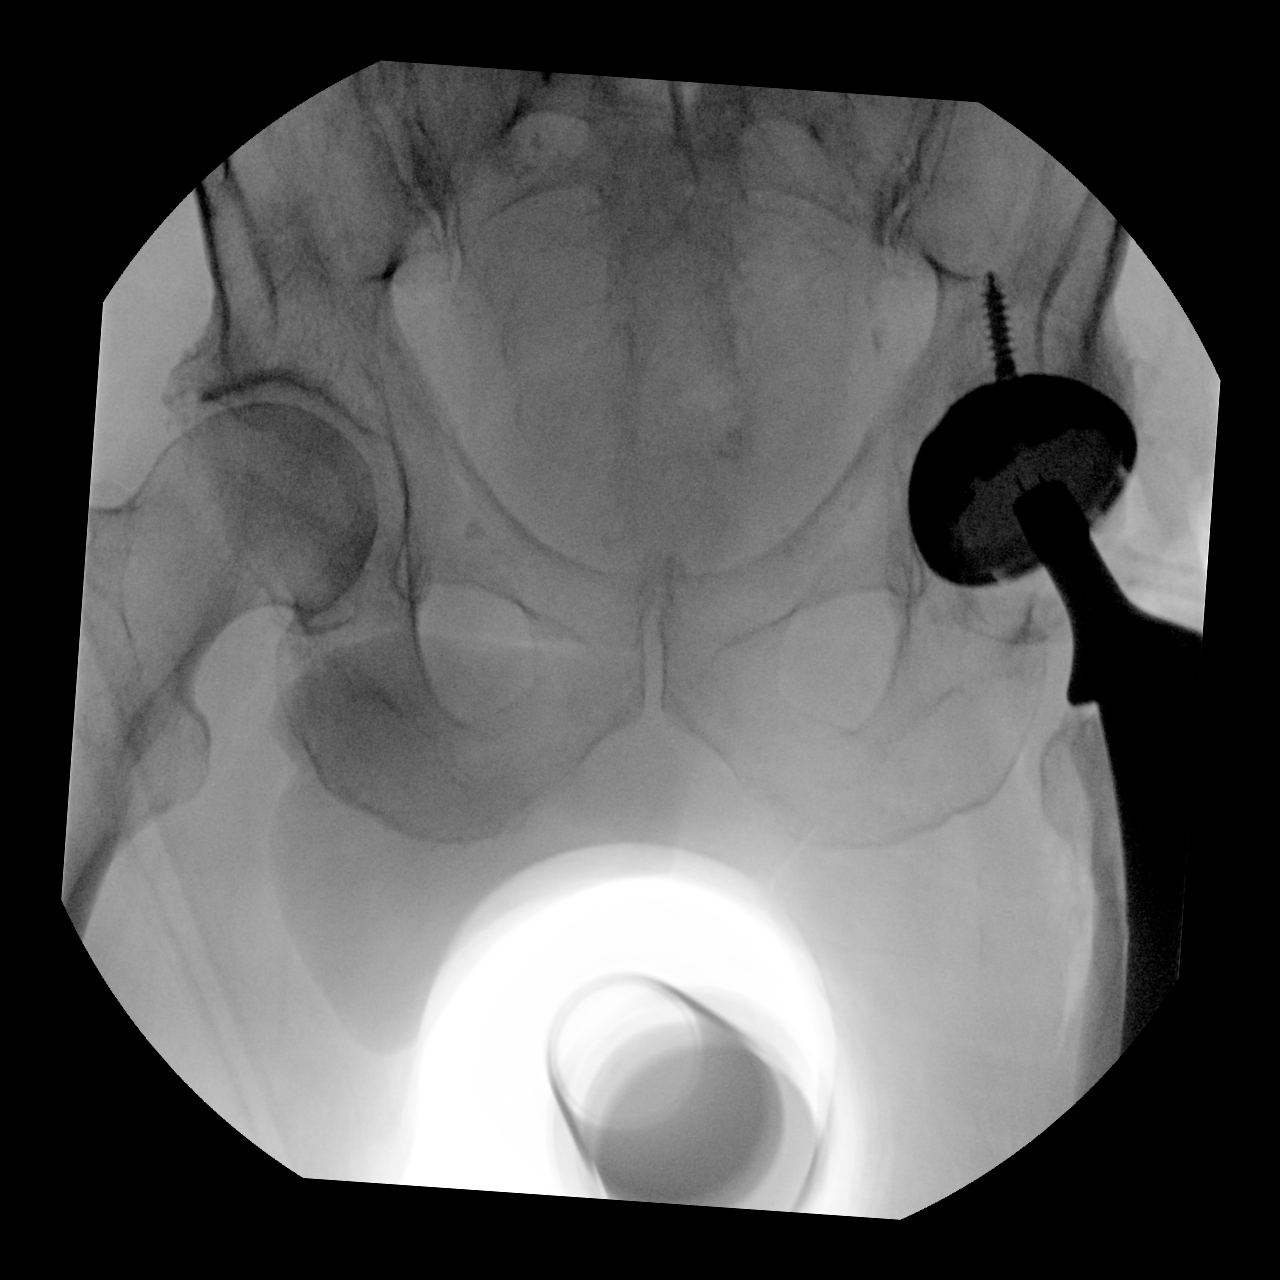

[2 of 2 positions shown; findings below may reference images not displayed]

FINDINGS: Intraoperative imaging demonstrates changes of left hip replacement.
Normal AP alignment. No visible hardware complicating feature.
IMPRESSION: Left hip replacement.  No visible complicating feature.

## 2019-10-26 IMAGING — DX DG PORTABLE PELVIS
1 series · 1 of 1 positions shown · non-contrast
Comparison: None.

CLINICAL DATA: Postop

EXAM:
PORTABLE PELVIS 1-2 VIEWS

[pelvis ap]
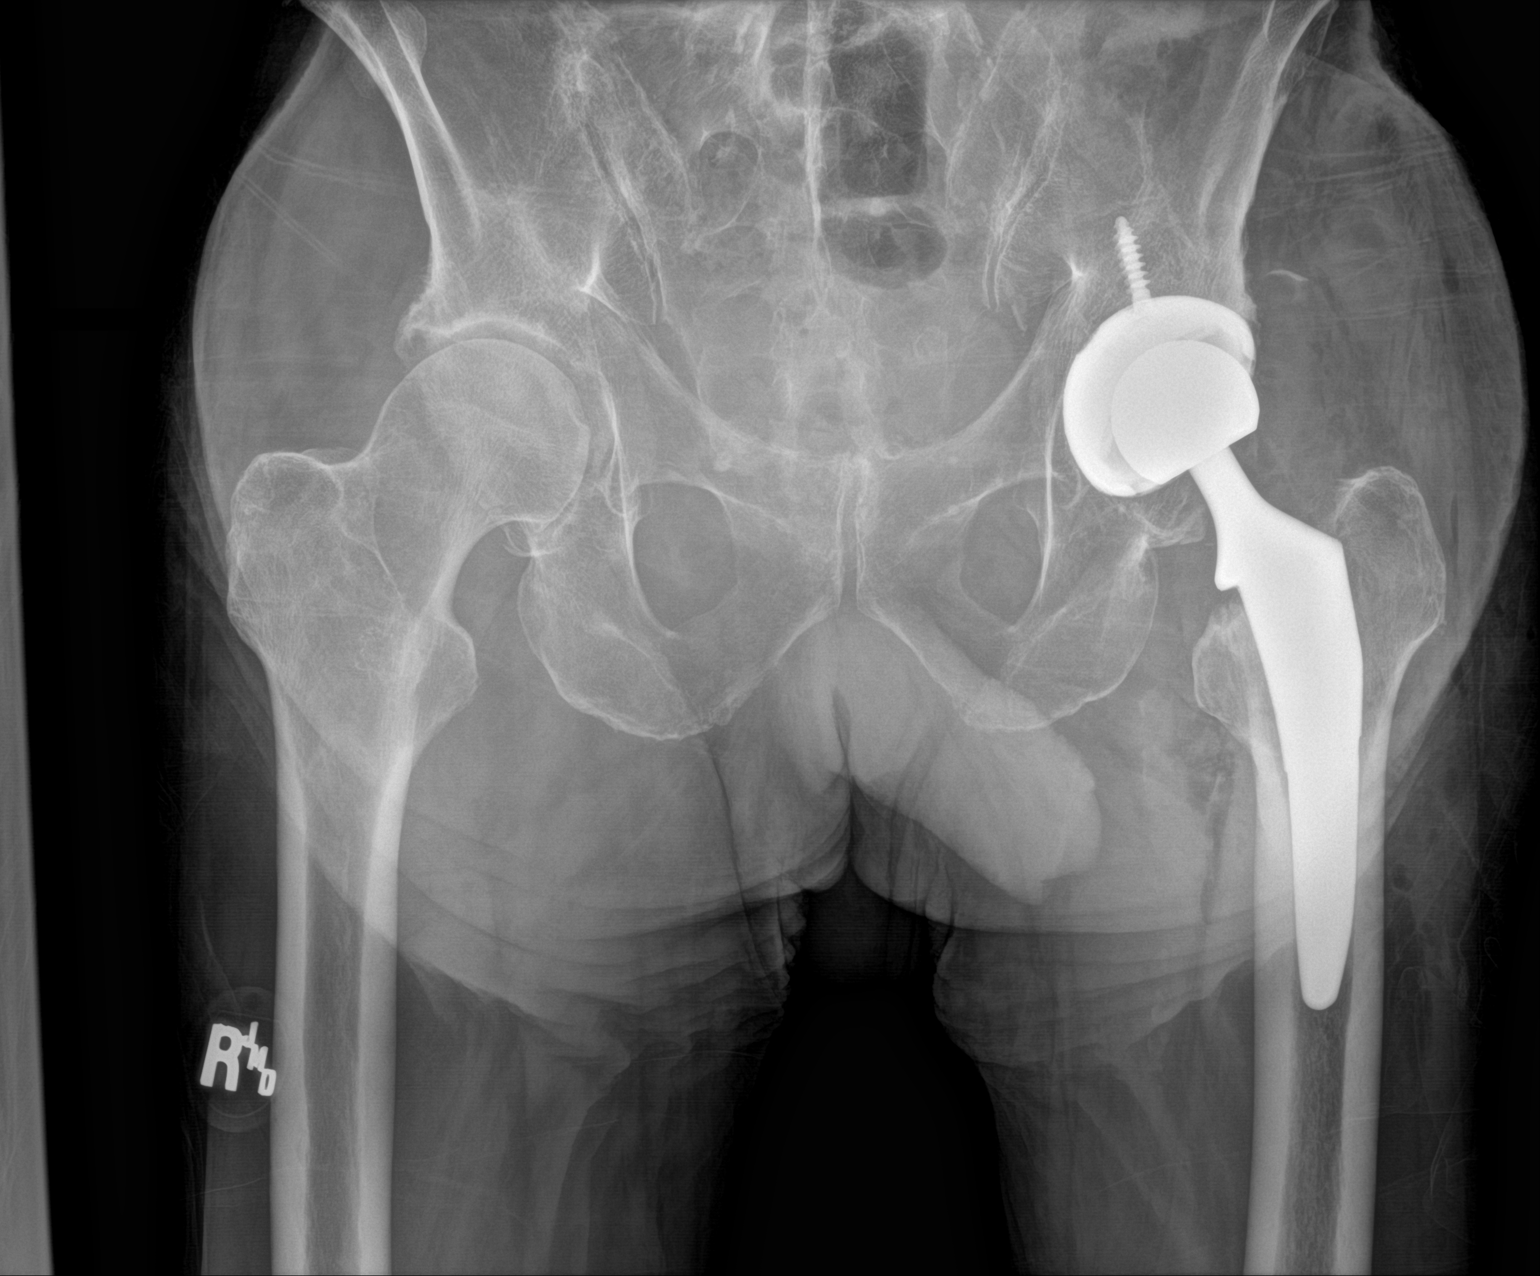

[1 of 1 positions shown; findings below may reference images not displayed]

FINDINGS: Changes of left hip replacement. Normal AP alignment. No hardware
bony complicating feature. Degenerative changes in the right hip.
IMPRESSION: Left hip replacement.  No visible complicating feature.

## 2019-10-26 SURGERY — ARTHROPLASTY, HIP, TOTAL, ANTERIOR APPROACH
Anesthesia: Spinal | Site: Hip | Laterality: Left

## 2019-10-26 MED ORDER — ACETAMINOPHEN 325 MG PO TABS: 325.0000 mg | ORAL_TABLET | Freq: Four times a day (QID) | ORAL | Status: DC | PRN

## 2019-10-26 MED ORDER — METOCLOPRAMIDE HCL 5 MG PO TABS: 5.0000 mg | ORAL_TABLET | Freq: Three times a day (TID) | ORAL | Status: DC | PRN

## 2019-10-26 MED ORDER — DEXAMETHASONE SODIUM PHOSPHATE 10 MG/ML IJ SOLN
10.0000 mg | Freq: Once | INTRAMUSCULAR | Status: AC
  Administered 2019-10-26: 10 mg via INTRAVENOUS

## 2019-10-26 MED ORDER — LACTATED RINGERS IV SOLN: INTRAVENOUS | Status: DC

## 2019-10-26 MED ORDER — BISACODYL 10 MG RE SUPP: 10.0000 mg | Freq: Every day | RECTAL | Status: DC | PRN

## 2019-10-26 MED ORDER — CELECOXIB 200 MG PO CAPS
200.0000 mg | ORAL_CAPSULE | Freq: Two times a day (BID) | ORAL | Status: DC
  Administered 2019-10-26 – 2019-10-28 (×4): 200 mg via ORAL
  Filled 2019-10-26 (×4): qty 1

## 2019-10-26 MED ORDER — HYDROCODONE-ACETAMINOPHEN 7.5-325 MG PO TABS
1.0000 | ORAL_TABLET | ORAL | Status: DC | PRN
  Administered 2019-10-26: 2 via ORAL
  Administered 2019-10-28 (×2): 1 via ORAL
  Filled 2019-10-26: qty 1
  Filled 2019-10-26: qty 2
  Filled 2019-10-26: qty 1

## 2019-10-26 MED ORDER — ONDANSETRON HCL 4 MG/2ML IJ SOLN: 4.0000 mg | Freq: Four times a day (QID) | INTRAMUSCULAR | Status: DC | PRN

## 2019-10-26 MED ORDER — STERILE WATER FOR IRRIGATION IR SOLN
Status: DC | PRN
  Administered 2019-10-26: 2000 mL

## 2019-10-26 MED ORDER — PHENOL 1.4 % MT LIQD: 1.0000 | OROMUCOSAL | Status: DC | PRN

## 2019-10-26 MED ORDER — SODIUM CHLORIDE 0.9 % IV SOLN: INTRAVENOUS | Status: DC

## 2019-10-26 MED ORDER — DEXAMETHASONE SODIUM PHOSPHATE 10 MG/ML IJ SOLN
10.0000 mg | Freq: Once | INTRAMUSCULAR | Status: AC
  Administered 2019-10-27: 10 mg via INTRAVENOUS
  Filled 2019-10-26: qty 1

## 2019-10-26 MED ORDER — ASPIRIN 81 MG PO CHEW
81.0000 mg | CHEWABLE_TABLET | Freq: Every evening | ORAL | Status: DC
  Administered 2019-10-26 – 2019-10-27 (×2): 81 mg via ORAL
  Filled 2019-10-26 (×2): qty 1

## 2019-10-26 MED ORDER — FENTANYL CITRATE (PF) 250 MCG/5ML IJ SOLN
INTRAMUSCULAR | Status: AC
  Filled 2019-10-26: qty 5

## 2019-10-26 MED ORDER — FENTANYL CITRATE (PF) 100 MCG/2ML IJ SOLN
INTRAMUSCULAR | Status: AC
  Filled 2019-10-26: qty 2

## 2019-10-26 MED ORDER — FERROUS SULFATE 325 (65 FE) MG PO TABS
325.0000 mg | ORAL_TABLET | Freq: Three times a day (TID) | ORAL | Status: DC
  Administered 2019-10-27 (×3): 325 mg via ORAL
  Filled 2019-10-26 (×3): qty 1

## 2019-10-26 MED ORDER — METHOCARBAMOL 500 MG PO TABS
500.0000 mg | ORAL_TABLET | Freq: Four times a day (QID) | ORAL | Status: DC | PRN
  Filled 2019-10-26: qty 1

## 2019-10-26 MED ORDER — ONDANSETRON HCL 4 MG PO TABS: 4.0000 mg | ORAL_TABLET | Freq: Four times a day (QID) | ORAL | Status: DC | PRN

## 2019-10-26 MED ORDER — ONDANSETRON HCL 4 MG/2ML IJ SOLN
INTRAMUSCULAR | Status: AC
  Filled 2019-10-26: qty 2

## 2019-10-26 MED ORDER — DOCUSATE SODIUM 100 MG PO CAPS
100.0000 mg | ORAL_CAPSULE | Freq: Two times a day (BID) | ORAL | Status: DC
  Administered 2019-10-26 – 2019-10-28 (×4): 100 mg via ORAL
  Filled 2019-10-26 (×4): qty 1

## 2019-10-26 MED ORDER — ACETAMINOPHEN 500 MG PO TABS
1000.0000 mg | ORAL_TABLET | Freq: Once | ORAL | Status: AC
  Administered 2019-10-26: 1000 mg via ORAL
  Filled 2019-10-26: qty 2

## 2019-10-26 MED ORDER — LEVOTHYROXINE SODIUM 75 MCG PO TABS
75.0000 ug | ORAL_TABLET | Freq: Every day | ORAL | Status: DC
  Administered 2019-10-27 – 2019-10-28 (×2): 75 ug via ORAL
  Filled 2019-10-26 (×2): qty 1

## 2019-10-26 MED ORDER — ATORVASTATIN CALCIUM 20 MG PO TABS
20.0000 mg | ORAL_TABLET | Freq: Every day | ORAL | Status: DC
  Administered 2019-10-26 – 2019-10-27 (×2): 20 mg via ORAL
  Filled 2019-10-26 (×2): qty 1

## 2019-10-26 MED ORDER — PHENYLEPHRINE HCL-NACL 10-0.9 MG/250ML-% IV SOLN
INTRAVENOUS | Status: DC | PRN
  Administered 2019-10-26: 35 ug/min via INTRAVENOUS

## 2019-10-26 MED ORDER — ENSURE MAX PROTEIN PO LIQD
11.0000 [oz_av] | Freq: Every day | ORAL | Status: DC
  Administered 2019-10-26 – 2019-10-28 (×3): 11 [oz_av] via ORAL
  Filled 2019-10-26 (×3): qty 330

## 2019-10-26 MED ORDER — METOCLOPRAMIDE HCL 5 MG/ML IJ SOLN: 5.0000 mg | Freq: Three times a day (TID) | INTRAMUSCULAR | Status: DC | PRN

## 2019-10-26 MED ORDER — MIDAZOLAM HCL 5 MG/5ML IJ SOLN
INTRAMUSCULAR | Status: DC | PRN
  Administered 2019-10-26: 1 mg via INTRAVENOUS

## 2019-10-26 MED ORDER — MAGNESIUM CITRATE PO SOLN: 1.0000 | Freq: Once | ORAL | Status: DC | PRN

## 2019-10-26 MED ORDER — FENTANYL CITRATE (PF) 100 MCG/2ML IJ SOLN
25.0000 ug | INTRAMUSCULAR | Status: DC | PRN
  Administered 2019-10-26: 50 ug via INTRAVENOUS

## 2019-10-26 MED ORDER — TRANEXAMIC ACID-NACL 1000-0.7 MG/100ML-% IV SOLN
1000.0000 mg | Freq: Once | INTRAVENOUS | Status: AC
  Administered 2019-10-26: 1000 mg via INTRAVENOUS
  Filled 2019-10-26: qty 100

## 2019-10-26 MED ORDER — DEXAMETHASONE SODIUM PHOSPHATE 10 MG/ML IJ SOLN
INTRAMUSCULAR | Status: AC
  Filled 2019-10-26: qty 1

## 2019-10-26 MED ORDER — HYDROCODONE-ACETAMINOPHEN 5-325 MG PO TABS
1.0000 | ORAL_TABLET | ORAL | Status: DC | PRN
  Administered 2019-10-26 – 2019-10-27 (×5): 2 via ORAL
  Filled 2019-10-26 (×5): qty 2

## 2019-10-26 MED ORDER — ROCURONIUM BROMIDE 100 MG/10ML IV SOLN
INTRAVENOUS | Status: DC | PRN
Start: 2019-10-26 — End: 2019-10-26
  Administered 2019-10-26: 15 mg via INTRAVENOUS

## 2019-10-26 MED ORDER — CEFAZOLIN SODIUM-DEXTROSE 2-4 GM/100ML-% IV SOLN
2.0000 g | INTRAVENOUS | Status: AC
  Administered 2019-10-26: 11:00:00 2 g via INTRAVENOUS
  Filled 2019-10-26: qty 100

## 2019-10-26 MED ORDER — SUCCINYLCHOLINE CHLORIDE 20 MG/ML IJ SOLN
INTRAMUSCULAR | Status: DC | PRN
Start: 2019-10-26 — End: 2019-10-26
  Administered 2019-10-26: 20 mg via INTRAVENOUS

## 2019-10-26 MED ORDER — SUGAMMADEX SODIUM 200 MG/2ML IV SOLN
INTRAVENOUS | Status: DC | PRN
  Administered 2019-10-26: 100 mg via INTRAVENOUS

## 2019-10-26 MED ORDER — DIPHENHYDRAMINE HCL 12.5 MG/5ML PO ELIX: 12.5000 mg | ORAL_SOLUTION | ORAL | Status: DC | PRN

## 2019-10-26 MED ORDER — PROPOFOL 500 MG/50ML IV EMUL
INTRAVENOUS | Status: AC
  Filled 2019-10-26: qty 50

## 2019-10-26 MED ORDER — FENTANYL CITRATE (PF) 100 MCG/2ML IJ SOLN
INTRAMUSCULAR | Status: DC | PRN
  Administered 2019-10-26 (×5): 50 ug via INTRAVENOUS

## 2019-10-26 MED ORDER — 0.9 % SODIUM CHLORIDE (POUR BTL) OPTIME
TOPICAL | Status: DC | PRN
  Administered 2019-10-26: 1000 mL

## 2019-10-26 MED ORDER — PROPOFOL 1000 MG/100ML IV EMUL
INTRAVENOUS | Status: AC
  Filled 2019-10-26: qty 100

## 2019-10-26 MED ORDER — MIDAZOLAM HCL 2 MG/2ML IJ SOLN
INTRAMUSCULAR | Status: AC
  Filled 2019-10-26: qty 2

## 2019-10-26 MED ORDER — AMLODIPINE BESYLATE 5 MG PO TABS
5.0000 mg | ORAL_TABLET | Freq: Every day | ORAL | Status: DC
  Administered 2019-10-27 – 2019-10-28 (×2): 5 mg via ORAL
  Filled 2019-10-26 (×2): qty 1

## 2019-10-26 MED ORDER — TICAGRELOR 60 MG PO TABS
60.0000 mg | ORAL_TABLET | Freq: Every day | ORAL | Status: DC
  Administered 2019-10-27 – 2019-10-28 (×2): 60 mg via ORAL
  Filled 2019-10-26 (×2): qty 1

## 2019-10-26 MED ORDER — POLYETHYLENE GLYCOL 3350 17 G PO PACK
17.0000 g | PACK | Freq: Two times a day (BID) | ORAL | Status: DC
  Administered 2019-10-26 – 2019-10-27 (×3): 17 g via ORAL
  Filled 2019-10-26 (×3): qty 1

## 2019-10-26 MED ORDER — PROPOFOL 10 MG/ML IV BOLUS
INTRAVENOUS | Status: DC | PRN
  Administered 2019-10-26 (×2): 20 mg via INTRAVENOUS
  Administered 2019-10-26: 110 mg via INTRAVENOUS

## 2019-10-26 MED ORDER — EPHEDRINE SULFATE-NACL 50-0.9 MG/10ML-% IV SOSY
PREFILLED_SYRINGE | INTRAVENOUS | Status: DC | PRN
  Administered 2019-10-26 (×4): 10 mg via INTRAVENOUS

## 2019-10-26 MED ORDER — MENTHOL 3 MG MT LOZG: 1.0000 | LOZENGE | OROMUCOSAL | Status: DC | PRN

## 2019-10-26 MED ORDER — ALUM & MAG HYDROXIDE-SIMETH 200-200-20 MG/5ML PO SUSP: 15.0000 mL | ORAL | Status: DC | PRN

## 2019-10-26 MED ORDER — CEFAZOLIN SODIUM-DEXTROSE 2-4 GM/100ML-% IV SOLN
2.0000 g | Freq: Four times a day (QID) | INTRAVENOUS | Status: AC
  Administered 2019-10-26 (×2): 2 g via INTRAVENOUS
  Filled 2019-10-26 (×2): qty 100

## 2019-10-26 MED ORDER — TRANEXAMIC ACID-NACL 1000-0.7 MG/100ML-% IV SOLN
1000.0000 mg | INTRAVENOUS | Status: DC
  Filled 2019-10-26: qty 100

## 2019-10-26 MED ORDER — METHOCARBAMOL 500 MG IVPB - SIMPLE MED
500.0000 mg | Freq: Four times a day (QID) | INTRAVENOUS | Status: DC | PRN
  Filled 2019-10-26: qty 50

## 2019-10-26 MED ORDER — HYDROMORPHONE HCL 1 MG/ML IJ SOLN: 0.5000 mg | INTRAMUSCULAR | Status: DC | PRN

## 2019-10-26 SURGICAL SUPPLY — 48 items
ADH SKN CLS APL DERMABOND .7 (GAUZE/BANDAGES/DRESSINGS) ×2
BAG DECANTER FOR FLEXI CONT (MISCELLANEOUS) IMPLANT
BAG SPEC THK2 15X12 ZIP CLS (MISCELLANEOUS)
BAG ZIPLOCK 12X15 (MISCELLANEOUS) IMPLANT
BLADE SAG 18X100X1.27 (BLADE) ×3 IMPLANT
BLADE SURG SZ10 CARB STEEL (BLADE) ×6 IMPLANT
COVER PERINEAL POST (MISCELLANEOUS) ×3 IMPLANT
COVER SURGICAL LIGHT HANDLE (MISCELLANEOUS) ×3 IMPLANT
COVER WAND RF STERILE (DRAPES) ×2 IMPLANT
CUP ACETBLR 54 OD PINNACLE (Hips) ×2 IMPLANT
DERMABOND ADVANCED (GAUZE/BANDAGES/DRESSINGS) ×4
DERMABOND ADVANCED .7 DNX12 (GAUZE/BANDAGES/DRESSINGS) ×1 IMPLANT
DRAPE STERI IOBAN 125X83 (DRAPES) ×3 IMPLANT
DRAPE U-SHAPE 47X51 STRL (DRAPES) ×6 IMPLANT
DRESSING AQUACEL AG SP 3.5X10 (GAUZE/BANDAGES/DRESSINGS) ×1 IMPLANT
DRSG AQUACEL AG SP 3.5X10 (GAUZE/BANDAGES/DRESSINGS) ×3
DURAPREP 26ML APPLICATOR (WOUND CARE) ×3 IMPLANT
ELECT REM PT RETURN 15FT ADLT (MISCELLANEOUS) ×3 IMPLANT
ELIMINATOR HOLE APEX DEPUY (Hips) ×2 IMPLANT
GLOVE BIO SURGEON STRL SZ 6 (GLOVE) ×6 IMPLANT
GLOVE BIOGEL PI IND STRL 6.5 (GLOVE) ×1 IMPLANT
GLOVE BIOGEL PI IND STRL 7.5 (GLOVE) ×1 IMPLANT
GLOVE BIOGEL PI IND STRL 8.5 (GLOVE) ×1 IMPLANT
GLOVE BIOGEL PI INDICATOR 6.5 (GLOVE) ×2
GLOVE BIOGEL PI INDICATOR 7.5 (GLOVE) ×2
GLOVE BIOGEL PI INDICATOR 8.5 (GLOVE) ×2
GLOVE ECLIPSE 8.0 STRL XLNG CF (GLOVE) ×6 IMPLANT
GLOVE ORTHO TXT STRL SZ7.5 (GLOVE) ×6 IMPLANT
GOWN STRL REUS W/TWL LRG LVL3 (GOWN DISPOSABLE) ×6 IMPLANT
GOWN STRL REUS W/TWL XL LVL3 (GOWN DISPOSABLE) ×3 IMPLANT
HEAD CERAMIC 36 PLUS5 (Hips) ×2 IMPLANT
HOLDER FOLEY CATH W/STRAP (MISCELLANEOUS) ×3 IMPLANT
KIT TURNOVER KIT A (KITS) IMPLANT
LINER NEUTRAL 54X36MM PLUS 4 (Hips) ×2 IMPLANT
PACK ANTERIOR HIP CUSTOM (KITS) ×3 IMPLANT
PENCIL SMOKE EVACUATOR (MISCELLANEOUS) ×2 IMPLANT
SCREW 6.5MMX25MM (Screw) ×2 IMPLANT
STEM FEM ACTIS HIGH SZ8 (Stem) ×2 IMPLANT
SUT MNCRL AB 4-0 PS2 18 (SUTURE) ×3 IMPLANT
SUT STRATAFIX 0 PDS 27 VIOLET (SUTURE) ×3
SUT VIC AB 1 CT1 36 (SUTURE) ×9 IMPLANT
SUT VIC AB 2-0 CT1 27 (SUTURE) ×6
SUT VIC AB 2-0 CT1 TAPERPNT 27 (SUTURE) ×2 IMPLANT
SUTURE STRATFX 0 PDS 27 VIOLET (SUTURE) ×1 IMPLANT
TRAY CATH 16FR W/PLASTIC CATH (SET/KITS/TRAYS/PACK) ×2 IMPLANT
TRAY FOLEY MTR SLVR 16FR STAT (SET/KITS/TRAYS/PACK) IMPLANT
WATER STERILE IRR 1000ML POUR (IV SOLUTION) ×3 IMPLANT
YANKAUER SUCT BULB TIP 10FT TU (MISCELLANEOUS) IMPLANT

## 2019-10-26 NOTE — Anesthesia Postprocedure Evaluation (Signed)
Anesthesia Post Note  Patient: TAIGE DESANTIS  Procedure(s) Performed: TOTAL HIP ARTHROPLASTY ANTERIOR APPROACH (Left Hip)     Patient location during evaluation: PACU Anesthesia Type: General Level of consciousness: awake and alert Pain management: pain level controlled Vital Signs Assessment: post-procedure vital signs reviewed and stable Respiratory status: spontaneous breathing, nonlabored ventilation, respiratory function stable and patient connected to nasal cannula oxygen Cardiovascular status: blood pressure returned to baseline and stable Postop Assessment: no apparent nausea or vomiting Anesthetic complications: no    Last Vitals:  Vitals:   10/26/19 1500 10/26/19 1554  BP: 140/79 (!) 157/79  Pulse: 68 75  Resp: 14 14  Temp: 36.7 C 36.4 C  SpO2: 100% 100%    Last Pain:  Vitals:   10/26/19 1415  TempSrc:   PainSc: 4                  Barbarajean Kinzler L Shawanda Sievert

## 2019-10-26 NOTE — Discharge Instructions (Signed)

## 2019-10-26 NOTE — Anesthesia Procedure Notes (Signed)
Procedure Name: MAC Date/Time: 10/26/2019 11:06 AM Performed by: Lissa Morales, CRNA Pre-anesthesia Checklist: Patient identified, Suction available, Emergency Drugs available, Patient being monitored and Timeout performed Patient Re-evaluated:Patient Re-evaluated prior to induction Oxygen Delivery Method: Simple face mask Placement Confirmation: positive ETCO2

## 2019-10-26 NOTE — Evaluation (Signed)
Physical Therapy Evaluation Patient Details Name: Alexander Duran MRN: [id number] DOB: 01-11-1946 Today's Date: 10/26/2019   History of Present Illness  L AA THA  Clinical Impression  Pt is s/p THA resulting in the deficits listed below (see PT Problem List). Mod assist for bed mobility. Min A sit to stand. In standing, pt performed weight shifting with RW but was unable to take any steps 2* 10/10 L hip pain, despite having pain medication prior to session. Initiated THA HEP.  Pt will benefit from skilled PT to increase their independence and safety with mobility to allow discharge to the venue listed below.      Follow Up Recommendations Follow surgeon's recommendation for DC plan and follow-up therapies    Equipment Recommendations  3in1 (PT)    Recommendations for Other Services       Precautions / Restrictions Precautions Precautions: Fall Restrictions Weight Bearing Restrictions: No Other Position/Activity Restrictions: WBAT      Mobility  Bed Mobility Overal bed mobility: Needs Assistance Bed Mobility: Supine to Sit     Supine to sit: Mod assist     General bed mobility comments: assist to support LLE and pivot hips to EOB with pad; assist for LEs into bed  Transfers Overall transfer level: Needs assistance Equipment used: Rolling walker (2 wheeled) Transfers: Sit to/from Stand Sit to Stand: Min assist;From elevated surface         General transfer comment: VCs hand placement, min A to rise. Pt stood at edge of bed and performed weight shifting with RW. He was unable to take any steps 2* 10/10 L hip pain.Assisted pt back to bed.  Ambulation/Gait             General Gait Details: unable 2* pain  Stairs            Wheelchair Mobility    Modified Rankin (Stroke Patients Only)       Balance Overall balance assessment: Needs assistance Sitting-balance support: Feet supported;Single extremity supported Sitting balance-Leahy Scale: Good      Standing balance support: Bilateral upper extremity supported Standing balance-Leahy Scale: Poor Standing balance comment: relies on BUE support                             Pertinent Vitals/Pain Pain Assessment: 0-10 Pain Score: 10-Worst pain ever Pain Location: L hip in standing Pain Descriptors / Indicators: Sore Pain Intervention(s): Limited activity within patient's tolerance;Monitored during session;Premedicated before session;Ice applied    Home Living Family/patient expects to be discharged to:: Private residence Living Arrangements: Spouse/significant other Available Help at Discharge: Family;Available 24 hours/day Type of Home: House Home Access: Stairs to enter Entrance Stairs-Rails: None Entrance Stairs-Number of Steps: 3 Home Layout: One level Home Equipment: Environmental consultant - 2 wheels      Prior Function Level of Independence: Independent               Hand Dominance        Extremity/Trunk Assessment   Upper Extremity Assessment Upper Extremity Assessment: Overall WFL for tasks assessed    Lower Extremity Assessment Lower Extremity Assessment: LLE deficits/detail LLE Deficits / Details: hip flexion AAROM ~30*, limited by pain, hip abduction AAROM ~15* limited by pain LLE: Unable to fully assess due to pain LLE Sensation: WNL LLE Coordination: WNL    Cervical / Trunk Assessment Cervical / Trunk Assessment: Kyphotic  Communication   Communication: HOH  Cognition Arousal/Alertness: Awake/alert Behavior During Therapy: [facility]  for tasks assessed/performed Overall Cognitive Status: Within Functional Limits for tasks assessed                                        General Comments      Exercises Total Joint Exercises Ankle Circles/Pumps: AROM;Both;10 reps;Supine Heel Slides: AAROM;Left;5 reps;Supine Hip ABduction/ADduction: AAROM;Left;5 reps;Supine   Assessment/Plan    PT Assessment Patient needs continued PT services   PT Problem List Decreased strength;Decreased range of motion;Decreased activity tolerance;Decreased balance;Pain;Decreased knowledge of use of DME;Decreased mobility       PT Treatment Interventions DME instruction;Gait training;Stair training;Therapeutic exercise;Therapeutic activities;Patient/family education    PT Goals (Current goals can be found in the Care Plan section)  Acute Rehab PT Goals Patient Stated Goal: work in garden PT Goal Formulation: With patient Time For Goal Achievement: 11/02/19 Potential to Achieve Goals: Good    Frequency 7X/week   Barriers to discharge        Co-evaluation               AM-PAC PT "6 Clicks" Mobility  Outcome Measure Help needed turning from your back to your side while in a flat bed without using bedrails?: A Little Help needed moving from lying on your back to sitting on the side of a flat bed without using bedrails?: A Lot Help needed moving to and from a bed to a chair (including a wheelchair)?: A Lot Help needed standing up from a chair using your arms (e.g., wheelchair or bedside chair)?: A Little Help needed to walk in hospital room?: Total Help needed climbing 3-5 steps with a railing? : Total 6 Click Score: 12    End of Session Equipment Utilized During Treatment: Gait belt Activity Tolerance: Patient limited by pain Patient left: in bed;with call bell/phone within reach;with bed alarm set Nurse Communication: Mobility status PT Visit Diagnosis: Difficulty in walking, not elsewhere classified (R26.2);Pain Pain - Right/Left: Left Pain - part of body: Hip    Time: [id number] PT Time Calculation (min) (ACUTE ONLY): 28 min   Charges:   PT Evaluation $PT Eval Low Complexity: 1 Low PT Treatments $Therapeutic Activity: 8-22 mins       Blondell Reveal Kistler PT 10/26/2019  Acute Rehabilitation Services Pager [phone] Office [phone]

## 2019-10-26 NOTE — Interval H&P Note (Signed)
History and Physical Interval Note:  10/26/2019 10:02 AM  Alexander Duran  has presented today for surgery, with the diagnosis of Left hip osteoarthritis.  The various methods of treatment have been discussed with the patient and family. After consideration of risks, benefits and other options for treatment, the patient has consented to  Procedure(s) with comments: TOTAL HIP ARTHROPLASTY ANTERIOR APPROACH (Left) - 70 mins as a surgical intervention.  The patient's history has been reviewed, patient examined, no change in status, stable for surgery.  I have reviewed the patient's chart and labs.  Questions were answered to the patient's satisfaction.     Mauri Pole

## 2019-10-26 NOTE — Anesthesia Preprocedure Evaluation (Addendum)
Anesthesia Evaluation  Patient identified by MRN, date of birth, ID band Patient awake    Reviewed: Allergy & Precautions, NPO status , Patient's Chart, lab work & pertinent test results  Airway Mallampati: II  TM Distance: >3 FB Neck ROM: Full    Dental  (+) Dental Advisory Given, Edentulous Upper, Edentulous Lower   Pulmonary sleep apnea , former smoker,    Pulmonary exam normal breath sounds clear to auscultation       Cardiovascular hypertension, Pt. on medications + Peripheral Vascular Disease (carotid artery stenosis)  Normal cardiovascular exam Rhythm:Regular Rate:Normal  TTE 2019 Normal LVEF, valves ok  Carotid Doppler 2019 Known RICA occulsion, patent stent LICA   Neuro/Psych  Headaches, negative psych ROS   GI/Hepatic negative GI ROS, Neg liver ROS,   Endo/Other  Hypothyroidism   Renal/GU negative Renal ROS  negative genitourinary   Musculoskeletal  (+) Arthritis ,   Abdominal   Peds  Hematology  (+) Blood dyscrasia (on brilinta, last dose 10/18/19), ,   Anesthesia Other Findings   Reproductive/Obstetrics                           Anesthesia Physical Anesthesia Plan  ASA: III  Anesthesia Plan: Spinal   Post-op Pain Management:    Induction:   PONV Risk Score and Plan: Treatment may vary due to age or medical condition, Propofol infusion and Ondansetron  Airway Management Planned: Natural Airway  Additional Equipment:   Intra-op Plan:   Post-operative Plan:   Informed Consent: I have reviewed the patients History and Physical, chart, labs and discussed the procedure including the risks, benefits and alternatives for the proposed anesthesia with the patient or authorized representative who has indicated his/her understanding and acceptance.     Dental advisory given  Plan Discussed with: CRNA  Anesthesia Plan Comments:         Anesthesia Quick  Evaluation

## 2019-10-26 NOTE — Anesthesia Procedure Notes (Addendum)
Procedure Name: Intubation Date/Time: 10/26/2019 11:40 AM Performed by: Lissa Morales, CRNA Pre-anesthesia Checklist: Patient identified, Emergency Drugs available, Suction available and Patient being monitored Patient Re-evaluated:Patient Re-evaluated prior to induction Oxygen Delivery Method: Circle system utilized Preoxygenation: Pre-oxygenation with 100% oxygen Induction Type: IV induction Ventilation: Mask ventilation without difficulty Laryngoscope Size: Glidescope and 3 Grade View: Grade II Tube type: Oral Number of attempts: 2 Airway Equipment and Method: Stylet and Oral airway Placement Confirmation: ETT inserted through vocal cords under direct vision,  positive ETCO2 and breath sounds checked- equal and bilateral Secured at: 21 cm Tube secured with: Tape Dental Injury: Teeth and Oropharynx as per pre-operative assessment  Comments: Trachea deviated  Anterior larynx,easy with glidescope hx of radical neck with  radiation

## 2019-10-26 NOTE — Op Note (Signed)
NAME:  Alexander Duran                ACCOUNT NO.: [id number]      MEDICAL RECORD NO.: [id number]      FACILITY:  [facility]      PHYSICIAN:  Mauri Pole  DATE OF BIRTH:  1946-01-14     DATE OF PROCEDURE:  10/26/2019                                 OPERATIVE REPORT         PREOPERATIVE DIAGNOSIS: Left  hip osteoarthritis.      POSTOPERATIVE DIAGNOSIS:  Left hip osteoarthritis.      PROCEDURE:  Left total hip replacement through an anterior approach   utilizing DePuy THR system, component size 54 mm pinnacle cup, a size 36+4 neutral   Altrex liner, a size 8 Hi Actis stem with a 36+5 delta ceramic   ball.      SURGEON:  Pietro Cassis. Alvan Dame, M.D.      ASSISTANT:  Danae Orleans, PA-C     ANESTHESIA:  General.      SPECIMENS:  None.      COMPLICATIONS:  None.      BLOOD LOSS:  150 cc     DRAINS:  None.      INDICATION OF THE PROCEDURE:  Alexander Duran is a 74 y.o. male who had   presented to office for evaluation of left hip pain.  Radiographs revealed   progressive degenerative changes with bone-on-bone   articulation of the  hip joint, including subchondral cystic changes and osteophytes.  The patient had painful limited range of   motion significantly affecting their overall quality of life and function.  The patient was failing to    respond to conservative measures including medications and/or injections and activity modification and at this point was ready   to proceed with more definitive measures.  Consent was obtained for   benefit of pain relief.  Specific risks of infection, DVT, component   failure, dislocation, neurovascular injury, and need for revision surgery were reviewed in the office as well discussion of   the anterior versus posterior approach were reviewed.     PROCEDURE IN DETAIL:  The patient was brought to operative theater.   Once adequate anesthesia, preoperative antibiotics, 2 gm of Ancef, 1 gm of Tranexamic Acid, and 10 mg  of Decadron were administered, the patient was positioned supine on the [employer] table.  Once the patient was safely positioned with adequate padding of boney prominences we predraped out the hip, and used fluoroscopy to confirm orientation of the pelvis.      The left hip was then prepped and draped from proximal iliac crest to   mid thigh with a shower curtain technique.      Time-out was performed identifying the patient, planned procedure, and the appropriate extremity.     An incision was then made 2 cm lateral to the   anterior superior iliac spine extending over the orientation of the   tensor fascia lata muscle and sharp dissection was carried down to the   fascia of the muscle.      The fascia was then incised.  The muscle belly was identified and swept   laterally and retractor placed along the superior neck.  Following   cauterization of the circumflex vessels and removing some  pericapsular   fat, a second cobra retractor was placed on the inferior neck.  A T-capsulotomy was made along the line of the   superior neck to the trochanteric fossa, then extended proximally and   distally.  Tag sutures were placed and the retractors were then placed   intracapsular.  We then identified the trochanteric fossa and   orientation of my neck cut and then made a neck osteotomy with the femur on traction.  The femoral   head was removed without difficulty or complication.  Traction was let   off and retractors were placed posterior and anterior around the   acetabulum.      The labrum and foveal tissue were debrided.  I began reaming with a 47 mm   reamer and reamed up to 53 mm reamer with good bony bed preparation and a 54 mm  cup was chosen.  The final 54 mm Pinnacle cup was then impacted under fluoroscopy to confirm the depth of penetration and orientation with respect to   Abduction and forward flexion.  A screw was placed into the ilium followed by the hole eliminator.  The final    36+4 neutral Altrex liner was impacted with good visualized rim fit.  The cup was positioned anatomically within the acetabular portion of the pelvis.      At this point, the femur was rolled to 100 degrees.  Further capsule was   released off the inferior aspect of the femoral neck.  I then   released the superior capsule proximally.  With the leg in a neutral position the hook was placed laterally   along the femur under the vastus lateralis origin and elevated manually and then held in position using the hook attachment on the bed.  The leg was then extended and adducted with the leg rolled to 100   degrees of external rotation.  Retractors were placed along the medial calcar and posteriorly over the greater trochanter.  Once the proximal femur was fully   exposed, I used a box osteotome to set orientation.  I then began   broaching with the starting chili pepper broach and passed this by hand and then broached up to 8.  With the 8 broach in place I chose a high offset neck and did several trial reductions.  The offset was appropriate, leg lengths   appeared to be equal best matched with the +5 versus the +1.5 head ball trial confirmed radiographically.   Given these findings, I went ahead and dislocated the hip, repositioned all   retractors and positioned the right hip in the extended and abducted position.  The final 8 Hi Actis stem was   chosen and it was impacted down to the level of neck cut.  Based on this   and the trial reductions, a final 36+5 delta ceramic ball was chosen and   impacted onto a clean and dry trunnion, and the hip was reduced.  The   hip had been irrigated throughout the case again at this point.  I did   reapproximate the superior capsular leaflet to the anterior leaflet   using #1 Vicryl.  The fascia of the   tensor fascia lata muscle was then reapproximated using #1 Vicryl and #0 Stratafix sutures.  The   remaining wound was closed with 2-0 Vicryl and running 4-0  Monocryl.   The hip was cleaned, dried, and dressed sterilely using Dermabond and   Aquacel dressing.  The patient was then brought  to recovery room in stable condition tolerating the procedure well.    Danae Orleans, PA-C was present for the entirety of the case involved from   preoperative positioning, perioperative retractor management, general   facilitation of the case, as well as primary wound closure as assistant.            Pietro Cassis Alvan Dame, M.D.        10/26/2019 12:40 PM

## 2019-10-26 NOTE — Transfer of Care (Signed)
Immediate Anesthesia Transfer of Care Note  Patient: Alexander Duran  Procedure(s) Performed: TOTAL HIP ARTHROPLASTY ANTERIOR APPROACH (Left Hip)  Patient Location: PACU  Anesthesia Type:General  Level of Consciousness: awake, alert , oriented and patient cooperative  Airway & Oxygen Therapy: Patient Spontanous Breathing and Patient connected to face mask oxygen  Post-op Assessment: Report given to RN, Post -op Vital signs reviewed and stable and Patient moving all extremities X 4  Post vital signs: stable  Last Vitals:  Vitals Value Taken Time  BP 123/86 10/26/19 1315  Temp    Pulse 56 10/26/19 1325  Resp 11 10/26/19 1325  SpO2 100 % 10/26/19 1325  Vitals shown include unvalidated device data.  Last Pain:  Vitals:   10/26/19 0950  TempSrc:   PainSc: 0-No pain         Complications: No apparent anesthesia complications

## 2019-10-27 ENCOUNTER — Encounter (HOSPITAL_COMMUNITY): Payer: Self-pay | Admitting: Orthopedic Surgery

## 2019-10-27 DIAGNOSIS — M1612 Unilateral primary osteoarthritis, left hip: Secondary | ICD-10-CM | POA: Diagnosis not present

## 2019-10-27 LAB — BASIC METABOLIC PANEL
Anion gap: 8 (ref 5–15)
BUN: 12 mg/dL (ref 8–23)
CO2: 27 mmol/L (ref 22–32)
Calcium: 8.9 mg/dL (ref 8.9–10.3)
Chloride: 100 mmol/L (ref 98–111)
Creatinine, Ser: 0.55 mg/dL — ABNORMAL LOW (ref 0.61–1.24)
GFR calc Af Amer: >60 mL/min (ref >60)
GFR calc non Af Amer: >60 mL/min (ref >60)
Glucose, Bld: 120 mg/dL — ABNORMAL HIGH (ref 70–99)
Potassium: 4.3 mmol/L (ref 3.5–5.1)
Sodium: 135 mmol/L (ref 135–145)

## 2019-10-27 LAB — CBC
HCT: 31.1 % — ABNORMAL LOW (ref 39.0–52.0)
Hemoglobin: 10.4 g/dL — ABNORMAL LOW (ref 13.0–17.0)
MCH: 32.1 pg (ref 26.0–34.0)
MCHC: 33.4 g/dL (ref 30.0–36.0)
MCV: 96 fL (ref 80.0–100.0)
Platelets: 235 10*3/uL (ref 150–400)
RBC: 3.24 MIL/uL — ABNORMAL LOW (ref 4.22–5.81)
RDW: 13.1 % (ref 11.5–15.5)
WBC: 8.9 10*3/uL (ref 4.0–10.5)
nRBC: 0 % (ref 0.0–0.2)

## 2019-10-27 MED ORDER — HYDROCODONE-ACETAMINOPHEN 7.5-325 MG PO TABS: 1.0000 | ORAL_TABLET | ORAL | 0 refills | Status: DC | PRN

## 2019-10-27 MED ORDER — POLYETHYLENE GLYCOL 3350 17 G PO PACK
17.0000 g | PACK | Freq: Two times a day (BID) | ORAL | 0 refills | Status: DC
Start: 2019-10-27 — End: 2020-01-20

## 2019-10-27 MED ORDER — DOCUSATE SODIUM 100 MG PO CAPS: 100.0000 mg | ORAL_CAPSULE | Freq: Two times a day (BID) | ORAL | 0 refills | Status: DC

## 2019-10-27 MED ORDER — FERROUS SULFATE 325 (65 FE) MG PO TABS: 325.0000 mg | ORAL_TABLET | Freq: Three times a day (TID) | ORAL | 0 refills | Status: DC

## 2019-10-27 MED ORDER — METHOCARBAMOL 500 MG PO TABS: 500.0000 mg | ORAL_TABLET | Freq: Four times a day (QID) | ORAL | 0 refills | Status: DC | PRN

## 2019-10-27 NOTE — Progress Notes (Signed)
Physical Therapy Treatment Patient Details Name: Alexander Duran MRN: [id number] DOB: [birth year] Today's Date: 10/27/2019    History of Present Illness L AA THA    PT Comments    POD # 1 am session. Assisted OOB to amb a limited distnce in hallway required increased time.  General bed mobility comments: demonstarted and instructed how to use a belt to self assist LE.  General transfer comment: 25% VC's on proper hand placement and safety with turns.  General Gait Details: unsteady, shaky gait with very small steps and poor forward flex posture.  Recliner following for safety. Increased unsteadiness with turns and delayed coorective reaction.  HIGH FALL RISK.   Pt will need another PT session to address stairs and HEP.   Follow Up Recommendations  Follow surgeon's recommendation for DC plan and follow-up therapies     Equipment Recommendations  3in1 (PT)    Recommendations for Other Services       Precautions / Restrictions Precautions Precautions: Fall Precaution Comments: Hx Tonsil CA Restrictions Weight Bearing Restrictions: No Other Position/Activity Restrictions: WBAT    Mobility  Bed Mobility Overal bed mobility: Needs Assistance Bed Mobility: Supine to Sit     Supine to sit: Min guard;Min assist     General bed mobility comments: demonstarted and instructed how to use a belt to self assist LE  Transfers Overall transfer level: Needs assistance Equipment used: Rolling walker (2 wheeled) Transfers: Sit to/from Stand Sit to Stand: Supervision;Min guard         General transfer comment: 25% VC's on proper hand placement and safety with turns  Ambulation/Gait Ambulation/Gait assistance: Min assist Gait Distance (Feet): 22 Feet Assistive device: Rolling walker (2 wheeled) Gait Pattern/deviations: Step-to pattern;Decreased stance time - left;Narrow base of support;Trunk flexed Gait velocity: decreased   General Gait Details: unsteady, shaky gait with very  small steps and poor forward flex posture.  Recliner following for safety. Increased unsteadiness with turns and delayed coorective reaction.  HIGH FALL RISK.   Stairs             Wheelchair Mobility    Modified Rankin (Stroke Patients Only)       Balance                                            Cognition Arousal/Alertness: Awake/alert Behavior During Therapy: WFL for tasks assessed/performed Overall Cognitive Status: Within Functional Limits for tasks assessed                                        Exercises      General Comments        Pertinent Vitals/Pain Pain Assessment: Faces Faces Pain Scale: Hurts a little bit Pain Location: L hip Pain Descriptors / Indicators: Grimacing;Tender Pain Intervention(s): Monitored during session;Premedicated before session;Repositioned;Ice applied    Home Living                      Prior Function            PT Goals (current goals can now be found in the care plan section) Progress towards PT goals: Progressing toward goals    Frequency    7X/week      PT Plan Current plan remains appropriate  Co-evaluation              AM-PAC PT "6 Clicks" Mobility   Outcome Measure  Help needed turning from your back to your side while in a flat bed without using bedrails?: A Little Help needed moving from lying on your back to sitting on the side of a flat bed without using bedrails?: A Little Help needed moving to and from a bed to a chair (including a wheelchair)?: A Little Help needed standing up from a chair using your arms (e.g., wheelchair or bedside chair)?: A Lot Help needed to walk in hospital room?: A Lot Help needed climbing 3-5 steps with a railing? : Total 6 Click Score: 14    End of Session Equipment Utilized During Treatment: Gait belt Activity Tolerance: Patient limited by fatigue Patient left: in chair;with call bell/phone within reach Nurse  Communication: Mobility status PT Visit Diagnosis: Difficulty in walking, not elsewhere classified (R26.2);Pain Pain - Right/Left: Left Pain - part of body: Hip     Time: 1102-1130 PT Time Calculation (min) (ACUTE ONLY): 28 min  Charges:  $Gait Training: 8-22 mins $Therapeutic Activity: 8-22 mins                     Rica Koyanagi  PTA Acute  Rehabilitation Services Pager      [phone] Office      [phone]

## 2019-10-27 NOTE — Progress Notes (Signed)
     Subjective: 1 Day Post-Op Procedure(s) (LRB): TOTAL HIP ARTHROPLASTY ANTERIOR APPROACH (Left)   Patient reports pain as mild, pain controlled.  No reported events throughout the night.  Patient feels the hip is feeling better than it was prior to surgery.  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  Patient states he is okay with medications that we will send him home with.  Patient will be discharged home today, if he does well therapy.  Patient is to call with any questions or concerns.  Patient follow-up in the clinic in 2 weeks.     Objective:   VITALS:   Vitals:   10/27/19 0135 10/27/19 0514  BP: (!) 104/56 120/69  Pulse: 62 (!) 59  Resp: 17 16  Temp: 97.6 F (36.4 C) 97.8 F (36.6 C)  SpO2: 100% 100%    Dorsiflexion/Plantar flexion intact Incision: dressing C/D/I No cellulitis present Compartment soft  LABS Recent Labs    10/27/19 0248  HGB 10.4*  HCT 31.1*  WBC 8.9  PLT 235    Recent Labs    10/27/19 0248  NA 135  K 4.3  BUN 12  CREATININE 0.55*  GLUCOSE 120*     Assessment/Plan: 1 Day Post-Op Procedure(s) (LRB): TOTAL HIP ARTHROPLASTY ANTERIOR APPROACH (Left)  Advance diet Up with therapy D/C IV fluids Discharge home Follow up in 2 weeks at [facility] Follow up with OLIN,Madelynn Malson D in 2 weeks.  Contact information:  [facility] [address] [place] [id number]        Danae Orleans PA-C  [facility]  [place] [address], [place], [place], [place] [address] Phone: [phone] [email or web address] Facebook  [employer]

## 2019-10-27 NOTE — Progress Notes (Signed)
Physical Therapy Treatment Patient Details Name: Alexander Duran MRN: [id number] DOB: 10/31/1945 Today's Date: 10/27/2019    History of Present Illness L AA THA    PT Comments    POD # 1 pm session Pt remains UNSTEADY requiring much assist with mobility.  HIGH FALL RISK.  NOT safe to D/C to home today.  Reported to RN.   Follow Up Recommendations  Follow surgeon's recommendation for DC plan and follow-up therapies     Equipment Recommendations  3in1 (PT)    Recommendations for Other Services       Precautions / Restrictions Precautions Precautions: Fall Precaution Comments: Hx Tonsil CA Restrictions Weight Bearing Restrictions: No Other Position/Activity Restrictions: WBAT    Mobility  Bed Mobility Overal bed mobility: Needs Assistance Bed Mobility: Supine to Sit     Supine to sit: Min guard;Min assist     General bed mobility comments: OOB in recliner  Transfers Overall transfer level: Needs assistance Equipment used: Rolling walker (2 wheeled) Transfers: Sit to/from Stand Sit to Stand: Supervision;Min guard         General transfer comment: 25% VC's on proper hand placement and safety with turns  Ambulation/Gait Ambulation/Gait assistance: Min assist Gait Distance (Feet): 34 Feet Assistive device: Rolling walker (2 wheeled) Gait Pattern/deviations: Step-to pattern;Decreased stance time - left;Narrow base of support;Trunk flexed Gait velocity: decreased   General Gait Details: unsteady, shaky gait with very small steps and poor forward flex posture.  Recliner following for safety. Increased unsteadiness with turns and delayed coorective reaction.  HIGH FALL RISK.   Stairs Stairs: (too unsteady to attempt stairs)           Wheelchair Mobility    Modified Rankin (Stroke Patients Only)       Balance                                            Cognition Arousal/Alertness: Awake/alert Behavior During Therapy: WFL for  tasks assessed/performed Overall Cognitive Status: Within Functional Limits for tasks assessed                                        Exercises      General Comments        Pertinent Vitals/Pain Pain Assessment: Faces Faces Pain Scale: Hurts a little bit Pain Location: L hip Pain Descriptors / Indicators: Grimacing;Tender Pain Intervention(s): Monitored during session;Premedicated before session;Repositioned;Ice applied    Home Living                      Prior Function            PT Goals (current goals can now be found in the care plan section) Progress towards PT goals: Progressing toward goals    Frequency    7X/week      PT Plan Current plan remains appropriate    Co-evaluation              AM-PAC PT "6 Clicks" Mobility   Outcome Measure  Help needed turning from your back to your side while in a flat bed without using bedrails?: A Little Help needed moving from lying on your back to sitting on the side of a flat bed without using bedrails?: A Little Help needed moving to and  from a bed to a chair (including a wheelchair)?: A Little Help needed standing up from a chair using your arms (e.g., wheelchair or bedside chair)?: A Lot Help needed to walk in hospital room?: A Lot Help needed climbing 3-5 steps with a railing? : Total 6 Click Score: 14    End of Session Equipment Utilized During Treatment: Gait belt Activity Tolerance: Patient limited by fatigue Patient left: in chair;with call bell/phone within reach Nurse Communication: Mobility status PT Visit Diagnosis: Difficulty in walking, not elsewhere classified (R26.2);Pain Pain - Right/Left: Left Pain - part of body: Hip     Time: [id number] PT Time Calculation (min) (ACUTE ONLY): 18 min  Charges:  $Gait Training: 8-22 mins $Therapeutic Activity: 8-22 mins                     Rica Koyanagi  PTA Acute  Rehabilitation Services Pager      [phone] Office       [phone]

## 2019-10-27 NOTE — TOC Progression Note (Signed)
Transition of Care [facility]) - Progression Note    Patient Details  Name: Alexander Duran MRN: [id number] Date of Birth: 12-14-1945  Transition of [facility]) CM/SW [place], LCSW Phone Number: 10/27/2019, 9:42 AM  Clinical Narrative:    Therapy Plan: HEP Patient confirm he has a RW and bedside commode at home. Patient neighbor will bring the RW for a safe transport home.      Barriers to Discharge: No Barriers Identified  Expected Discharge Plan and Services                           DME Arranged: N/A DME Agency: NA         HH Agency: NA         Social Determinants of Health (SDOH) Interventions    Readmission Risk Interventions No flowsheet data found.

## 2019-10-27 NOTE — Progress Notes (Addendum)
Pt did not pass PT. Danae Orleans, PA notified.   PA gave verbal orders to d/c discharge orders.

## 2019-10-28 DIAGNOSIS — M1612 Unilateral primary osteoarthritis, left hip: Secondary | ICD-10-CM | POA: Diagnosis not present

## 2019-10-28 LAB — CBC
HCT: 27.9 % — ABNORMAL LOW (ref 39.0–52.0)
Hemoglobin: 9.1 g/dL — ABNORMAL LOW (ref 13.0–17.0)
MCH: 31.1 pg (ref 26.0–34.0)
MCHC: 32.6 g/dL (ref 30.0–36.0)
MCV: 95.2 fL (ref 80.0–100.0)
Platelets: 222 10*3/uL (ref 150–400)
RBC: 2.93 MIL/uL — ABNORMAL LOW (ref 4.22–5.81)
RDW: 13.4 % (ref 11.5–15.5)
WBC: 10.4 10*3/uL (ref 4.0–10.5)
nRBC: 0 % (ref 0.0–0.2)

## 2019-10-28 LAB — BASIC METABOLIC PANEL
Anion gap: 6 (ref 5–15)
BUN: 22 mg/dL (ref 8–23)
CO2: 29 mmol/L (ref 22–32)
Calcium: 8.6 mg/dL — ABNORMAL LOW (ref 8.9–10.3)
Chloride: 97 mmol/L — ABNORMAL LOW (ref 98–111)
Creatinine, Ser: 0.64 mg/dL (ref 0.61–1.24)
GFR calc Af Amer: >60 mL/min (ref >60)
GFR calc non Af Amer: >60 mL/min (ref >60)
Glucose, Bld: 125 mg/dL — ABNORMAL HIGH (ref 70–99)
Potassium: 4.5 mmol/L (ref 3.5–5.1)
Sodium: 132 mmol/L — ABNORMAL LOW (ref 135–145)

## 2019-10-28 NOTE — Progress Notes (Signed)
Physical Therapy Treatment Patient Details Name: Alexander Duran MRN: [id number] DOB: 11/17/1945 Today's Date: 10/28/2019    History of Present Illness L DATHR    PT Comments    POD # 2 Pt feeling much better today.  Assisted out of recliner to amb a functional distance in hallway, practiced stairs Then returned to room to perform some TE's following HEP handout.  Instructed on proper tech, freq as well as use of ICE.   Addressed all mobility questions, discussed appropriate activity, educated on use of ICE.  Pt ready for D/C to home.   Follow Up Recommendations  Follow surgeon's recommendation for DC plan and follow-up therapies     Equipment Recommendations  3in1 (PT)    Recommendations for Other Services       Precautions / Restrictions Precautions Precautions: Fall Precaution Comments: Hx Tonsil CA Restrictions Weight Bearing Restrictions: No LLE Weight Bearing: Weight bearing as tolerated    Mobility  Bed Mobility               General bed mobility comments: Pt OOB in recliner  Transfers Overall transfer level: Needs assistance Equipment used: Rolling walker (2 wheeled) Transfers: Sit to/from [employer] Sit to Stand: Supervision Stand pivot transfers: Supervision       General transfer comment: <25% VC's on proper hand placement and safety with turns but progressing  Ambulation/Gait Ambulation/Gait assistance: Supervision Gait Distance (Feet): 75 Feet Assistive device: Rolling walker (2 wheeled) Gait Pattern/deviations: Step-to pattern;Decreased stance time - left;Narrow base of support;Trunk flexed     General Gait Details: much improved stability, tolerance today.   Stairs Stairs: Yes Stairs assistance: Min assist Stair Management: No rails;Step to pattern;With walker Number of Stairs: 2 General stair comments: up forward with walker 25% VC's on proper tech.  Performed twice.  Tolerated well.   Wheelchair Mobility     Modified Rankin (Stroke Patients Only)       Balance                                            Cognition Arousal/Alertness: Awake/alert Behavior During Therapy: WFL for tasks assessed/performed Overall Cognitive Status: Within Functional Limits for tasks assessed                                 General Comments: very intelligent      Exercises   Total Hip Replacement TE's following HEP Handout 10 reps ankle pumps 05 reps knee presses 05 reps heel slides 05 reps SAQ's 05 reps ABD Instructed how to use a belt loop to assist  Followed by ICE     General Comments        Pertinent Vitals/Pain Pain Assessment: 0-10 Pain Score: 4  Pain Location: L hip Pain Descriptors / Indicators: Grimacing;Tender;Operative site guarding Pain Intervention(s): Monitored during session;Premedicated before session;Repositioned;Ice applied    Home Living                      Prior Function            PT Goals (current goals can now be found in the care plan section) Progress towards PT goals: Progressing toward goals    Frequency    7X/week      PT Plan Current plan remains appropriate  Co-evaluation              AM-PAC PT "6 Clicks" Mobility   Outcome Measure  Help needed turning from your back to your side while in a flat bed without using bedrails?: None Help needed moving from lying on your back to sitting on the side of a flat bed without using bedrails?: None Help needed moving to and from a bed to a chair (including a wheelchair)?: None Help needed standing up from a chair using your arms (e.g., wheelchair or bedside chair)?: None Help needed to walk in hospital room?: None Help needed climbing 3-5 steps with a railing? : A Little 6 Click Score: 23    End of Session Equipment Utilized During Treatment: Gait belt Activity Tolerance: Patient tolerated treatment well Patient left: in chair;with call bell/phone  within reach Nurse Communication: Mobility status(pt has met goals for a safe D/C to home today) PT Visit Diagnosis: Difficulty in walking, not elsewhere classified (R26.2);Pain Pain - Right/Left: Left Pain - part of body: Hip     Time: 3729-0211 PT Time Calculation (min) (ACUTE ONLY): 29 min  Charges:  $Gait Training: 8-22 mins $Therapeutic Exercise: 8-22 mins                     {Kristena Wilhelmi  PTA Acute  Rehabilitation Services Pager      [phone] Office      [phone]

## 2019-10-28 NOTE — Progress Notes (Signed)
Patient ID: Alexander Duran, male   DOB: November 16, 1945, 74 y.o.   MRN: [id number] Subjective: 2 Days Post-Op Procedure(s) (LRB): TOTAL HIP ARTHROPLASTY ANTERIOR APPROACH (Left)    Patient reports pain as mild to moderate, doing better this am  Objective:   VITALS:   Vitals:   10/27/19 2048 10/28/19 0607  BP: (!) 150/81 128/71  Pulse: 62 (!) 57  Resp: 12 14  Temp: 98.4 F (36.9 C) 97.7 F (36.5 C)  SpO2: 100% 100%    Neurovascular intact Incision: dressing C/D/I - left hip  LABS Recent Labs    10/27/19 0248 10/28/19 0248  HGB 10.4* 9.1*  HCT 31.1* 27.9*  WBC 8.9 10.4  PLT 235 222    Recent Labs    10/27/19 0248 10/28/19 0248  NA 135 132*  K 4.3 4.5  BUN 12 22  CREATININE 0.55* 0.64  GLUCOSE 120* 125*    No results for input(s): LABPT, INR in the last 72 hours.   Assessment/Plan: 2 Days Post-Op Procedure(s) (LRB): TOTAL HIP ARTHROPLASTY ANTERIOR APPROACH (Left)   Up with therapy  Home after am therapy RTC in 2 weeks DVT proph and meds prescribed

## 2019-11-02 NOTE — Discharge Summary (Signed)
Physician Discharge Summary  Patient ID: Alexander Duran MRN: [id number] DOB/AGE: Dec 07, 1945 74 y.o.  Admit date: 10/26/2019 Discharge date: 10/28/2019   Procedures:  Procedure(s) (LRB): TOTAL HIP ARTHROPLASTY ANTERIOR APPROACH (Left)  Attending Physician:  Dr. Paralee Cancel   Admission Diagnoses:   Left hip primary OA / pain  Discharge Diagnoses:  Active Problems:   S/P left THA, AA  Past Medical History:  Diagnosis Date   Arthritis    Cancer ([place])    Tonsil cancer   Carotid artery stenosis without cerebral infarction 08/30/2015   Difficulty in swallowing    History of radiation therapy    Hypertension    Hypothyroidism 05/09/2014   Nausea in adult 08/10/2015   Other headache syndrome 08/10/2015   Sleep apnea    Thyroid disease    Weight loss 08/08/2015    HPI:    Alexander Duran, 74 y.o. male, has a history of pain and functional disability in the left hip(s) due to arthritis and patient has failed non-surgical conservative treatments for greater than 12 weeks to include NSAID's and/or analgesics and activity modification.  Onset of symptoms was gradual starting 2+ years ago with gradually worsening course since that time.The patient noted no past surgery on the left hip(s).  Patient currently rates pain in the left hip at 9 out of 10 with activity. Patient has night pain, worsening of pain with activity and weight bearing, trendelenberg gait, pain that interfers with activities of daily living and pain with passive range of motion. Patient has evidence of periarticular osteophytes and joint space narrowing by imaging studies. This condition presents safety issues increasing the risk of falls.  There is no current active infection.  Risks, benefits and expectations were discussed with the patient.  Risks including but not limited to the risk of anesthesia, blood clots, nerve damage, blood vessel damage, failure of the prosthesis, infection and up to and including  death.  Patient understand the risks, benefits and expectations and wishes to proceed with surgery.  PCP: Windy Fast, MD   Discharged Condition: good  Hospital Course:  Patient underwent the above stated procedure on 10/26/2019. Patient tolerated the procedure well and brought to the recovery room in good condition and subsequently to the floor.  POD #1 BP: 120/69 ; Pulse: 59 ; Temp: 97.8 F (36.6 C) ; Resp: 16 Patient reports pain as mild, pain controlled.  No reported events throughout the night.  Patient feels the hip is feeling better than it was prior to surgery.  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  Patient did not pass PT to be safely d/c'ed home.  Dorsiflexion/plantar flexion intact, incision: dressing C/D/I, no cellulitis present and compartment soft.   LABS  Basename    HGB     10.4  HCT     31.1   POD #2  BP: 128/71 ; Pulse: 57 ; Temp: 97.7 F (36.5 C) ; Resp: 14 Patient reports pain as mild to moderate, doing better this am.  Ready to be discharged home.  Neurovascular intact and incision: dressing C/D/I.   LABS  Basename    HGB     9.1  HCT     27.9    Discharge Exam: General appearance: alert, cooperative and no distress Extremities: Homans sign is negative, no sign of DVT, no edema, redness or tenderness in the calves or thighs and no ulcers, gangrene or trophic changes  Disposition: Home  with follow up in 2 weeks   Follow-up Information    Paralee Cancel, MD. Schedule an appointment as soon as possible for a visit in 2 weeks.   Specialty: Orthopedic Surgery Contact information: [address] [place] [address] [phone]             Allergies as of 10/28/2019   No Known Allergies     Medication List    STOP taking these medications   acetaminophen 500 MG tablet Commonly known as: TYLENOL   diclofenac sodium 1 % Gel Commonly known as: VOLTAREN   ibuprofen  200 MG tablet Commonly known as: ADVIL   lisinopril 5 MG tablet Commonly known as: Zestril     TAKE these medications   amLODipine 5 MG tablet Commonly known as: NORVASC Take 5 mg by mouth daily. What changed: Another medication with the same name was removed. Continue taking this medication, and follow the directions you see here.   aspirin 81 MG chewable tablet Chew 81 mg by mouth every evening.   atorvastatin 20 MG tablet Commonly known as: LIPITOR Take 20 mg by mouth daily.   Brilinta 60 MG Tabs tablet Generic drug: ticagrelor Take 60 mg by mouth daily.   docusate sodium 100 MG capsule Commonly known as: Colace Take 1 capsule (100 mg total) by mouth 2 (two) times daily.   ferrous sulfate 325 (65 FE) MG tablet Commonly known as: FerrouSul Take 1 tablet (325 mg total) by mouth 3 (three) times daily with meals for 14 days.   HYDROcodone-acetaminophen 7.5-325 MG tablet Commonly known as: Norco Take 1-2 tablets by mouth every 4 (four) hours as needed for moderate pain.   levothyroxine 75 MCG tablet Commonly known as: SYNTHROID Take 75 mcg by mouth daily before breakfast.   methocarbamol 500 MG tablet Commonly known as: Robaxin Take 1 tablet (500 mg total) by mouth every 6 (six) hours as needed for muscle spasms.   OVER THE COUNTER MEDICATION TheraTears Liquid Eye Drops- Place 1-2 drops into both eyes one to three times as day as needed for dryness or irritation   polyethylene glycol 17 g packet Commonly known as: MIRALAX / GLYCOLAX Take 17 g by mouth 2 (two) times daily.        Signed: West Pugh. Eisley Barber   PA-C  11/02/2019, 8:21 AM

## 2019-11-11 ENCOUNTER — Telehealth: Payer: Self-pay

## 2019-11-11 NOTE — Telephone Encounter (Signed)
[facility] received 12 pages of medical records from [facility]. Sending via interoffice mail to [facility] 11/11/19  KLM

## 2019-11-16 ENCOUNTER — Encounter: Payer: Self-pay | Admitting: Gastroenterology

## 2019-12-08 ENCOUNTER — Ambulatory Visit: Payer: No Typology Code available for payment source | Admitting: Gastroenterology

## 2020-01-06 ENCOUNTER — Ambulatory Visit: Payer: No Typology Code available for payment source | Admitting: Gastroenterology

## 2020-01-20 ENCOUNTER — Ambulatory Visit (INDEPENDENT_AMBULATORY_CARE_PROVIDER_SITE_OTHER): Payer: No Typology Code available for payment source | Admitting: Physician Assistant

## 2020-01-20 ENCOUNTER — Encounter: Payer: Self-pay | Admitting: Physician Assistant

## 2020-01-20 VITALS — BP 130/80 | HR 68 | Ht 68.0 in | Wt 150.2 lb

## 2020-01-20 DIAGNOSIS — R1310 Dysphagia, unspecified: Secondary | ICD-10-CM | POA: Diagnosis not present

## 2020-01-20 DIAGNOSIS — K224 Dyskinesia of esophagus: Secondary | ICD-10-CM | POA: Diagnosis not present

## 2020-01-20 DIAGNOSIS — Z1211 Encounter for screening for malignant neoplasm of colon: Secondary | ICD-10-CM | POA: Diagnosis not present

## 2020-01-20 DIAGNOSIS — Z85818 Personal history of malignant neoplasm of other sites of lip, oral cavity, and pharynx: Secondary | ICD-10-CM

## 2020-01-20 NOTE — Patient Instructions (Addendum)
If you are age 72 or older, your body mass index should be between 23-30. Your Body mass index is 22.85 kg/m. If this is out of the aforementioned range listed, please consider follow up with your Primary Care Provider.  If you are age 38 or younger, your body mass index should be between 19-25. Your Body mass index is 22.85 kg/m. If this is out of the aformentioned range listed, please consider follow up with your Primary Care Provider.   Your provider has requested that you go to the basement level for lab work before leaving today. Press "B" on the elevator. The lab is located at the first door on the left as you exit the elevator.  You have been scheduled for a Barium Esophogram at [facility] (1st floor of the hospital) on 01/27/20 at 10:30 am. Please arrive 15 minutes prior to your appointment for registration. Make certain not to have anything to eat or drink 3 hours prior to your test. If you need to reschedule for any reason, please contact radiology at [phone] to do so. __________________________________________________________________ A barium swallow is an examination that concentrates on views of the esophagus. This tends to be a double contrast exam (barium and two liquids which, when combined, create a gas to distend the wall of the oesophagus) or single contrast (non-ionic iodine based). The study is usually tailored to your symptoms so a good history is essential. Attention is paid during the study to the form, structure and configuration of the esophagus, looking for functional disorders (such as aspiration, dysphagia, achalasia, motility and reflux) EXAMINATION You may be asked to change into a gown, depending on the type of swallow being performed. A radiologist and radiographer will perform the procedure. The radiologist will advise you of the type of contrast selected for your procedure and direct you during the exam. You will be asked to stand, sit or lie in several  different positions and to hold a small amount of fluid in your mouth before being asked to swallow while the imaging is performed .In some instances you may be asked to swallow barium coated marshmallows to assess the motility of a solid food bolus. The exam can be recorded as a digital or video fluoroscopy procedure. POST PROCEDURE It will take 1-2 days for the barium to pass through your system. To facilitate this, it is important, unless otherwise directed, to increase your fluids for the next 24-48hrs and to resume your normal diet.  This test typically takes about 30 minutes to perform. __________________________________________________________________________________  Alexander Duran have been scheduled to follow up with Dr. Loletha Carrow on March 28, 2020 at 1:20 pm.

## 2020-01-20 NOTE — Progress Notes (Signed)
Subjective:    Patient ID: Alexander Duran, male    DOB: 1945/11/16, 74 y.o.   MRN: [id number]  HPI Alexander Duran is a pleasant 74 year old white male, known to Dr. Loletha Carrow from EGD done in 2018 for a food impaction, and not seen in our office since.  [facility] of worsening dysphagia. Patient has history of hypertension, severe carotid disease with total occlusion of the right carotid and left carotid stent placed in 2018.  He is maintained on aspirin and Brilinta, also with hypothyroidism, PTSD.  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 He was seen in 2018 with a food impaction and notes relate that patient required intubation for the procedure secondary to his prior neck surgery and that intubation was difficult.  He was noted to have an esophagus full of food.  There was noted to be abnormal motility of the esophagus and no normal peristalsis, no stricture noted and no resistance to passage of the scope at the EG J. Patient was advised to have esophageal manometry. By review of records it appears that he was referred to [facility] and saw Dr. Roney Mans in 2018 for manometry. Manometry was read as abnormal motility study with a normal relaxing LES with intact inadequate peristalsis .  He also had incomplete bolus transit for liquids.  There was a 2 cm hiatal hernia. Patient says he did not have any follow-up there after the manometry and no further recommendations were made. He says over the past few years he has had gradual worsening of dysphagia which has been severe this year.  He says he is to the point where it takes him 2 to 3 hours to eat a meal.  He is eating regular food but has to cut his food into chopped up tiny bites, it often takes him several swallows to get the food bolus to go down.  He does take small small swallows of liquids in between bites.  He is  unable to swallow liquids well unless he takes small swallows at a time.  He is getting his pills down, cutting some in half. He does not have any odynophagia.  He has the sensation that food just sits in his esophagus and eventually very slowly traverses to his stomach.  He is eating upright and remains upright for several hours after dinner prior to going to bed. He has had a gradual weight loss of about 30 pounds over the past 2 to 3 years and has been relatively stable over the past couple of months.  He is drinking boost at least once daily. Patient has not had prior colonoscopy.  He says he has done FIT testing in the past which was negative.  He is not interested in having a colonoscopy, though will do a fit test.  Review of Systems Pertinent positive and negative review of systems were noted in the above HPI section.  All other review of systems was otherwise negative.  Outpatient Encounter Medications as of 01/20/2020  Medication Sig  . amLODipine (NORVASC) 5 MG tablet Take 5 mg by mouth daily.  Marland Kitchen aspirin 81 MG chewable tablet Chew 81 mg by mouth every evening.   Marland Kitchen atorvastatin (LIPITOR) 20 MG tablet Take 20 mg by mouth daily.  Marland Kitchen levothyroxine (SYNTHROID, LEVOTHROID) 75 MCG tablet Take 75 mcg by mouth daily before breakfast.   . OVER THE  COUNTER MEDICATION TheraTears Liquid Eye Drops- Place 1-2 drops into both eyes one to three times as day as needed for dryness or irritation  . ticagrelor (BRILINTA) 60 MG TABS tablet Take 60 mg by mouth daily.  . [DISCONTINUED] docusate sodium (COLACE) 100 MG capsule Take 1 capsule (100 mg total) by mouth 2 (two) times daily.  . [DISCONTINUED] ferrous sulfate (FERROUSUL) 325 (65 FE) MG tablet Take 1 tablet (325 mg total) by mouth 3 (three) times daily with meals for 14 days.  . [DISCONTINUED] HYDROcodone-acetaminophen (NORCO) 7.5-325 MG tablet Take 1-2 tablets by mouth every 4 (four) hours as needed for moderate pain.  . [DISCONTINUED] methocarbamol  (ROBAXIN) 500 MG tablet Take 1 tablet (500 mg total) by mouth every 6 (six) hours as needed for muscle spasms.  . [DISCONTINUED] polyethylene glycol (MIRALAX / GLYCOLAX) 17 g packet Take 17 g by mouth 2 (two) times daily.   No facility-administered encounter medications on file as of 01/20/2020.   No Known Allergies Patient Active Problem List   Diagnosis Date Noted  . S/P left THA, AA 10/26/2019  . Diaphoresis 11/22/2017  . Shoulder pain 11/22/2017  . Hyponatremia 11/22/2017  . Chest pain 11/21/2017  . Esophageal obstruction due to food impaction   . Left carotid stenosis 08/02/2016  . Carotid disease, bilateral ([facility]) 09/14/2015  . Carotid artery stenosis without cerebral infarction 08/30/2015  . Other headache syndrome 08/10/2015  . Nausea in adult 08/10/2015  . Weight loss 08/08/2015  . Hypothyroidism 05/09/2014  . Essential hypertension 05/09/2014  . Tonsil cancer ([place]) 05/04/2013   Social History   Socioeconomic History  . Marital status: Married    Spouse name: Not on file  . Number of children: Not on file  . Years of education: Not on file  . Highest education level: Not on file  Occupational History  . Not on file  Tobacco Use  . Smoking status: Former Smoker    Packs/day: 1.00    Years: 25.00    Pack years: 25.00    Quit date: 05/04/2001    Years since quitting: 18.7  . Smokeless tobacco: Never Used  Vaping Use  . Vaping Use: Never used  Substance and Sexual Activity  . Alcohol use: Yes    Alcohol/week: 2.0 standard drinks    Types: 2 Shots of liquor per week    Comment: rarely  . Drug use: No  . Sexual activity: Not on file  Other Topics Concern  . Not on file  Social History Narrative  . Not on file   Social Determinants of Health   Financial Resource Strain:   . Difficulty of Paying Living Expenses:   Food Insecurity:   . Worried About Charity fundraiser in the Last Year:   . Arboriculturist in the Last Year:   Transportation Needs:   . Consulting civil engineer (Medical):   Marland Kitchen Lack of Transportation (Non-Medical):   Physical Activity:   . Days of Exercise per Week:   . Minutes of Exercise per Session:   Stress:   . Feeling of Stress :   Social Connections:   . Frequency of Communication with Friends and Family:   . Frequency of Social Gatherings with Friends and Family:   . Attends Religious Services:   . Active Member of Clubs or Organizations:   . Attends Archivist Meetings:   Marland Kitchen Marital Status:   Intimate Partner Violence:   . Fear of Current or Ex-Partner:   .  Emotionally Abused:   Marland Kitchen Physically Abused:   . Sexually Abused:     Alexander Duran family history includes COPD in his mother; Cancer in his father; Macular degeneration in his mother.      Objective:    Vitals:   01/20/20 1415  BP: 130/80  Pulse: 68    Physical Exam Well-developed well-nourished older white male in no acute distress.  Height, Weight 150, BMI 22.8  HEENT; nontraumatic normocephalic, EOMI, PER R LA, sclera anicteric. Oropharynx; not examined Neck; supple, no JVD, patient status post radical neck resection Cardiovascular; regular rate and rhythm with S1-S2, no murmur rub or gallop Pulmonary; Clear bilaterally Abdomen; soft, nontender, nondistended, no palpable mass or hepatosplenomegaly, bowel sounds are active Rectal; not done today Skin; benign exam, no jaundice rash or appreciable lesions Extremities; no clubbing cyanosis or edema skin warm and dry Neuro/Psych; alert and oriented x4, grossly nonfocal mood and affect appropriate       Assessment & Plan:   #21 74 year old white male with chronic progressive dysphagia to both solids and liquids, and associated 30 pound weight loss over the past 3 years. Patient has history of food impaction in 2018 and was noted at that time to have esophageal dysmotility with no normal peristalsis noted, Manometry done 2018 at [facility] consistent with diffuse esophageal dysmotility with  inadequate peristalsis and normal relaxing of the LES. Symptoms are in the setting of history of squamous cells CA of the right tonsil status post right radical neck resection followed by bilateral pharyngeal radiation.  Unclear whether dysphagia may have been caused by nerve injury with surgery and radiation.  Referral from the [facility] was made with suggestion of EGD to rule out stricture  #2 colon cancer screening-no prior colonoscopy, patient does not want to undergo colonoscopy but agreeable to fit test  #3 severe carotid disease status post left carotid stent and complete occlusion of the right carotid-chronically anticoagulated on Brilinta and aspirin #4 hypothyroid 5.  Hypertension  Plan; I do not think repeat EGD necessarily indicated at this time, patient would require intubation and interruption of anticoagulation. We will pursue barium swallow with tablet as initial study. I discussed the previous findings with the patient today, and advised that unfortunately there is generally not a good medical therapy for diffuse esophageal dysmotility. He is doing a good job with managing his symptoms with dietary alteration and protein shakes etc. Reviewed indication to stay upright for at least an hour postprandially and for several hours prior to bedtime. Suggested he may want to go to more of a full liquid or pured type diet which may make eating easier.  He will monitor his weight and add additional protein supplements if he has any further weight loss. Briefly discussed PEG tube though not indicated at present FIT test Will arrange for follow-up with Dr. Loletha Carrow in 3 to 4 weeks Alexander Aitken S Ferrin Liebig PA-C 01/20/2020   Cc: Windy Fast, MD

## 2020-01-21 NOTE — Progress Notes (Signed)
____________________________________________________________  Attending physician addendum:  Thank you for sending this case to me. I have reviewed the entire note, and the outlined plan seems appropriate.  He is high risk for EGD, so I agree with the barium study since this sounds most likely to be pharyngoesophageal dysphagia fro his prior surgery and XRT. He is similarly high risk for colonoscopy, and would most likely not tolerate the oral prep required.  Wilfrid Lund, MD  ____________________________________________________________

## 2020-01-25 ENCOUNTER — Other Ambulatory Visit (INDEPENDENT_AMBULATORY_CARE_PROVIDER_SITE_OTHER): Payer: Medicare Other

## 2020-01-25 DIAGNOSIS — Z1211 Encounter for screening for malignant neoplasm of colon: Secondary | ICD-10-CM

## 2020-01-25 DIAGNOSIS — K224 Dyskinesia of esophagus: Secondary | ICD-10-CM | POA: Diagnosis not present

## 2020-01-25 DIAGNOSIS — R1310 Dysphagia, unspecified: Secondary | ICD-10-CM

## 2020-01-25 LAB — FECAL OCCULT BLOOD, IMMUNOCHEMICAL: Fecal Occult Bld: NEGATIVE

## 2020-01-27 ENCOUNTER — Ambulatory Visit (HOSPITAL_COMMUNITY): Payer: No Typology Code available for payment source

## 2020-01-31 ENCOUNTER — Other Ambulatory Visit: Payer: Self-pay

## 2020-01-31 ENCOUNTER — Telehealth: Payer: Self-pay

## 2020-01-31 ENCOUNTER — Ambulatory Visit (HOSPITAL_COMMUNITY)
Admission: RE | Admit: 2020-01-31 | Discharge: 2020-01-31 | Disposition: A | Discharge status: Home / Self Care | Payer: No Typology Code available for payment source | Source: Ambulatory Visit | Attending: Physician Assistant | Admitting: Physician Assistant

## 2020-01-31 DIAGNOSIS — Z85818 Personal history of malignant neoplasm of other sites of lip, oral cavity, and pharynx: Secondary | ICD-10-CM

## 2020-01-31 DIAGNOSIS — R1310 Dysphagia, unspecified: Secondary | ICD-10-CM | POA: Insufficient documentation

## 2020-01-31 DIAGNOSIS — K224 Dyskinesia of esophagus: Secondary | ICD-10-CM | POA: Insufficient documentation

## 2020-01-31 DIAGNOSIS — C099 Malignant neoplasm of tonsil, unspecified: Secondary | ICD-10-CM

## 2020-01-31 DIAGNOSIS — R634 Abnormal weight loss: Secondary | ICD-10-CM

## 2020-01-31 IMAGING — RF DG ESOPHAGUS
4 series · 14 of 14 positions shown · non-contrast
Comparison: None

CLINICAL DATA: 73-year-old with history of chronic progressive
dysphagia with both solids and liquids.

EXAM:
ESOPHOGRAM/BARIUM SWALLOW
TECHNIQUE: Single contrast examination was performed using  thin barium.
FLUOROSCOPY TIME:  Fluoroscopy Time:  54 seconds
Radiation Exposure Index (if provided by the fluoroscopic device):
1.5 mGy
Number of Acquired Spot Images: 0

[Series 1: cp_standard · 0.35mm/px · 4 of 105 frames shown (1 of 4)]
[frame 16/105]
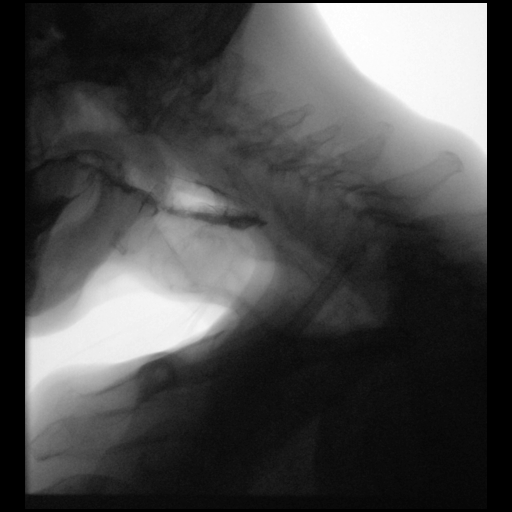
[frame 39/105]
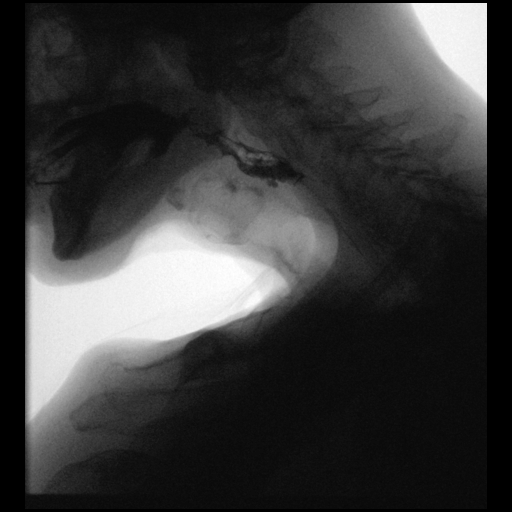
[frame 53/105]
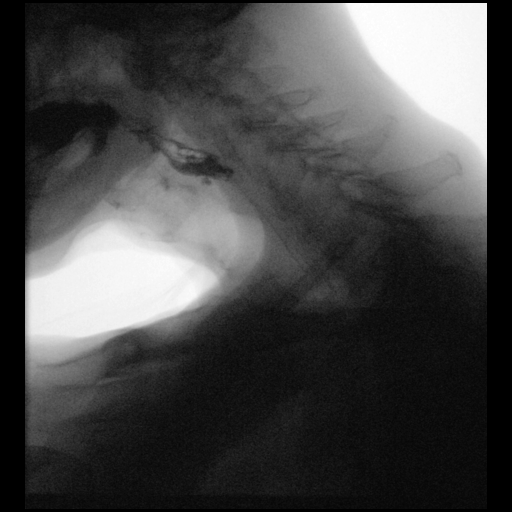
[frame 90/105]
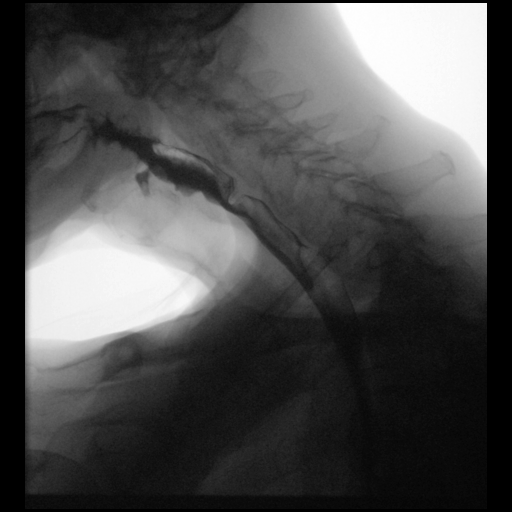

[Series 2: cp_standard · 0.35mm/px · 4 of 50 frames shown (2 of 4)]
[frame 8/50]
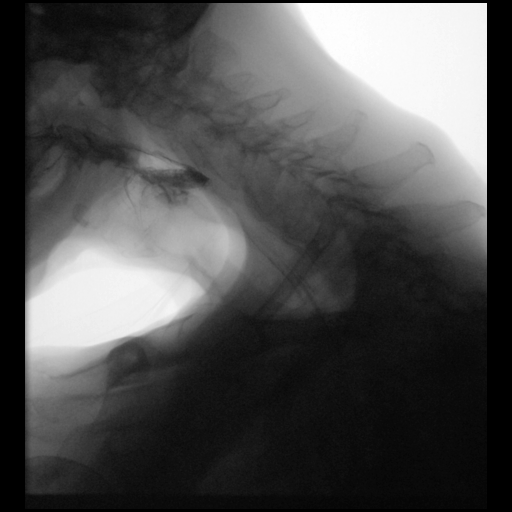
[frame 14/50]
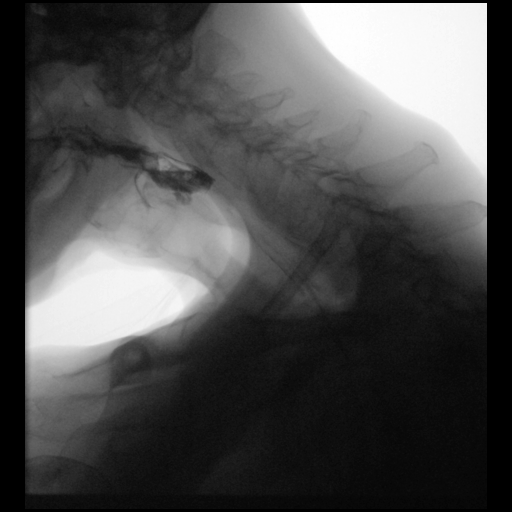
[frame 26/50]
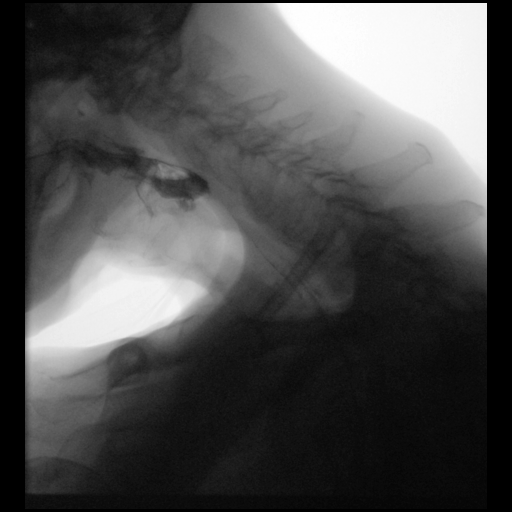
[frame 43/50]
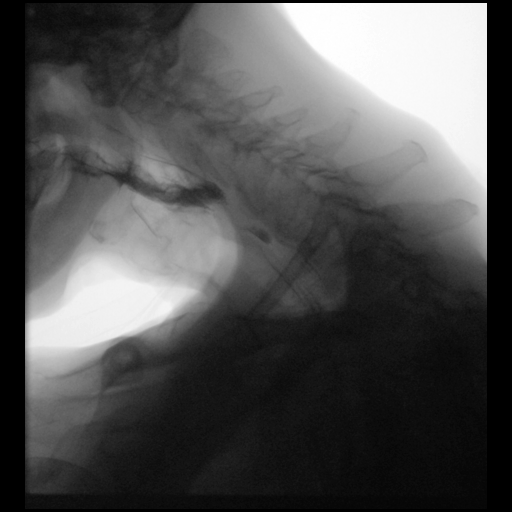

[Series 3: cp_standard · 0.35mm/px · 3 of 3 frames shown (3 of 4)]
[frame 1/3]
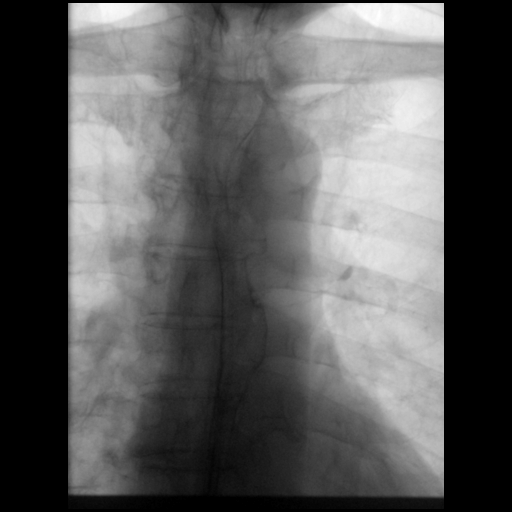
[frame 2/3]
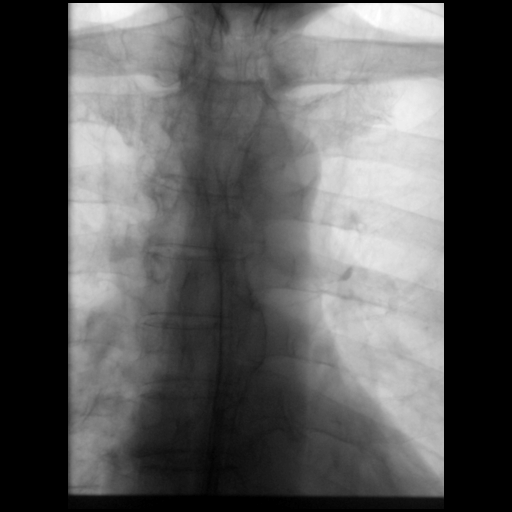
[frame 3/3]
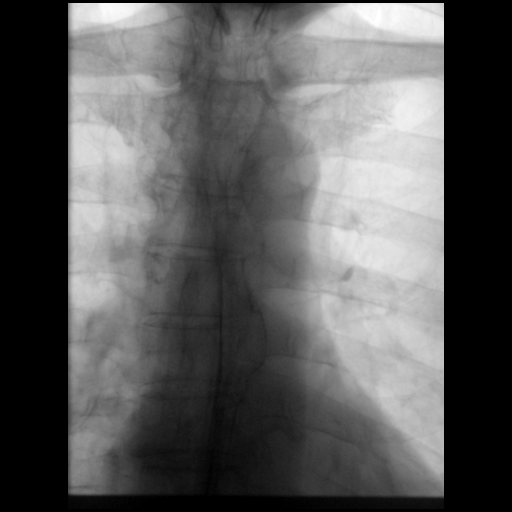

[Series 4: cp_standard · 0.35mm/px · 3 of 3 frames shown (4 of 4)]
[frame 1/3]
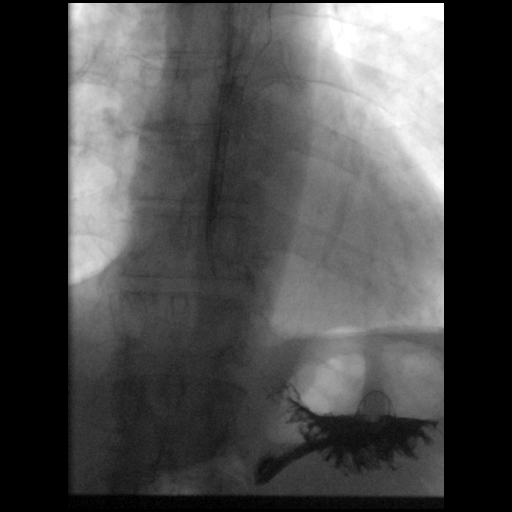
[frame 2/3]
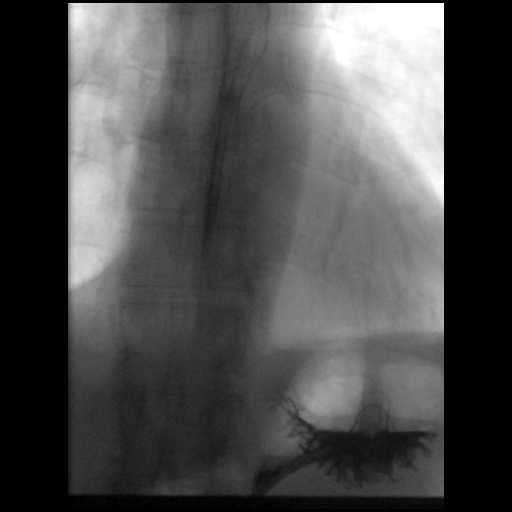
[frame 3/3]
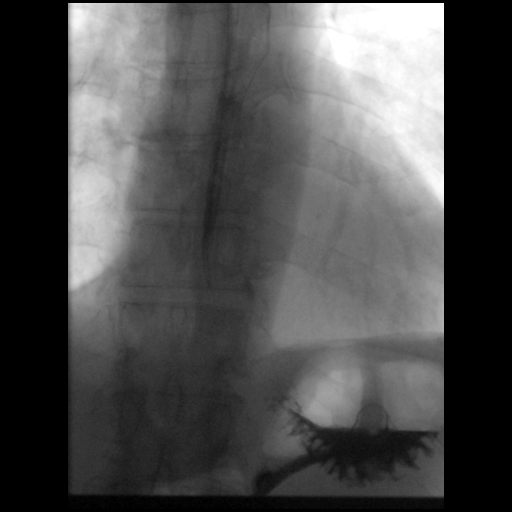

[14 of 14 positions shown; findings below may reference images not displayed]

FINDINGS: Lateral view with changes of neck dissection. Upon swallowing there
is distortion of the hypopharynx. There is spillage early into the
vallecula and Piriforms. Penetration and then near aspiration with
small amount of contrast tracking just above the vocal cords seen on
images which were not saved. Images shown demonstrate a small amount
of contrast along the anterior larynx. At this point the study was
terminated.

Incidental note is made of a moderately large CP bar.

Only a small amount of contrast was utilized due to history of
swallow dysfunction.
IMPRESSION: 1. Deep penetration and subtle signs of aspiration with thin barium
did not allow for continued evaluation. Postoperative and post
treatment changes associated with the oropharynx and neck.
2. Moderately large CP bar. Dedicated swallow assessment may be
helpful to determine whether further esophageal evaluation is
possible. The patient is only able to swallow small amounts of
barium contrast material which would make dedicated esophageal
evaluation difficult in the absence of any swallow dysfunction.
3. These results were called by telephone at the time of
interpretation on [DATE] at [DATE] to provider KAZUHIDE ,
who verbally acknowledged these results.

## 2020-01-31 NOTE — Telephone Encounter (Signed)
-----   Message from Alfredia Ferguson, PA-C sent at 01/31/2020 11:42 AM EDT ----- Regarding: specch path modified swallow study Beth, this patient had an attempt at barium swallow done in radiology today as an outpatient.  They had to abort the study due to concerns with aspiration. Please get patient scheduled for speech path/modified swallowing study.  Thank you

## 2020-02-01 ENCOUNTER — Other Ambulatory Visit (HOSPITAL_COMMUNITY): Payer: Self-pay

## 2020-02-01 DIAGNOSIS — R1310 Dysphagia, unspecified: Secondary | ICD-10-CM

## 2020-02-10 ENCOUNTER — Other Ambulatory Visit: Payer: Self-pay

## 2020-02-10 ENCOUNTER — Ambulatory Visit (HOSPITAL_COMMUNITY)
Admission: RE | Admit: 2020-02-10 | Discharge: 2020-02-10 | Disposition: A | Discharge status: Home / Self Care | Payer: Medicare Other | Source: Ambulatory Visit | Attending: Physician Assistant | Admitting: Physician Assistant

## 2020-02-10 DIAGNOSIS — R1310 Dysphagia, unspecified: Secondary | ICD-10-CM | POA: Diagnosis present

## 2020-02-10 DIAGNOSIS — C099 Malignant neoplasm of tonsil, unspecified: Secondary | ICD-10-CM | POA: Insufficient documentation

## 2020-02-10 DIAGNOSIS — R634 Abnormal weight loss: Secondary | ICD-10-CM | POA: Diagnosis present

## 2020-03-21 ENCOUNTER — Encounter: Payer: Self-pay | Admitting: Gastroenterology

## 2020-03-21 ENCOUNTER — Ambulatory Visit (INDEPENDENT_AMBULATORY_CARE_PROVIDER_SITE_OTHER): Payer: No Typology Code available for payment source | Admitting: Gastroenterology

## 2020-03-21 VITALS — BP 150/78 | HR 72 | Ht 65.0 in | Wt 153.0 lb

## 2020-03-21 DIAGNOSIS — M47812 Spondylosis without myelopathy or radiculopathy, cervical region: Secondary | ICD-10-CM | POA: Diagnosis not present

## 2020-03-21 DIAGNOSIS — R1314 Dysphagia, pharyngoesophageal phase: Secondary | ICD-10-CM | POA: Diagnosis not present

## 2020-03-21 DIAGNOSIS — K224 Dyskinesia of esophagus: Secondary | ICD-10-CM | POA: Diagnosis not present

## 2020-03-21 NOTE — Patient Instructions (Signed)
If you are age 72 or older, your body mass index should be between 23-30. Your Body mass index is 25.46 kg/m. If this is out of the aforementioned range listed, please consider follow up with your Primary Care Provider.  If you are age 18 or younger, your body mass index should be between 19-25. Your Body mass index is 25.46 kg/m. If this is out of the aformentioned range listed, please consider follow up with your Primary Care Provider.   It was a pleasure to see you today!  Dr. Loletha Carrow

## 2020-03-21 NOTE — Progress Notes (Signed)
[facility] Progress Note  Chief Complaint: Dysphagia  Subjective  History: EGD for food impaction in 2018. Seen in clinic 2 months ago summarizing progressive dysphagia.  Patient had evaluation at [facility] in 2018 with manometry showing normal relaxation of the LES with impaired peristalsis. 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  Alexander Duran's history is unchanged from before.  It takes him a couple of hours to consume a meal because everything is slow to pass through the upper esophagus.  He feels things hung up in the throat but does not seem to have to bring them back up.  He has not noticed coughing while eating or drinking.  He also does not get the sensation of food stuck in the lower chest. He has a least 1 Ensure per day and has been able to maintain his weight.  He is also quite active with exercise equipment and chopping wood.  ROS: Cardiovascular:  no chest pain Respiratory: no dyspnea  The patient's Past Medical, Family and Social History were reviewed and are on file in the EMR.  Objective:  Med list reviewed  Current Outpatient Medications:  .  amLODipine (NORVASC) 5 MG tablet, Take 5 mg by mouth daily., Disp: , Rfl:  .  aspirin 81 MG chewable tablet, Chew 81 mg by mouth every evening. , Disp: , Rfl:  .  atorvastatin (LIPITOR) 20 MG tablet, Take 20 mg by mouth daily., Disp: , Rfl:  .  levothyroxine (SYNTHROID, LEVOTHROID) 75 MCG tablet, Take 75 mcg by mouth daily before breakfast. , Disp: , Rfl:  .  OVER THE COUNTER MEDICATION, TheraTears Liquid Eye Drops- Place 1-2 drops into both eyes one to three times as day as needed for dryness or irritation, Disp: , Rfl:  .  ticagrelor (BRILINTA) 60 MG TABS tablet, Take 60 mg by mouth daily., Disp: , Rfl:    Vital signs in last 24  hrs: Vitals:   03/21/20 1312  BP: (!) 150/78  Pulse: 72    Physical Exam   HEENT: sclera anicteric, oral mucosa moist without lesions  Neck: supple, no thyromegaly, JVD or lymphadenopathy.  Marked cervical arthritis with kyphosis and anterior displacement head and neck  Cardiac: RRR without murmurs, S1S2 heard, no peripheral edema  Pulm: clear to auscultation bilaterally, normal RR and effort noted  Abdomen: soft, no tenderness, with active bowel sounds. No guarding or palpable hepatosplenomegaly.  Labs:   ___________________________________________ Radiologic studies:  CLINICAL DATA:  74 year old with history of chronic progressive dysphagia with both solids and liquids.   EXAM: ESOPHOGRAM/BARIUM SWALLOW   TECHNIQUE: Single contrast examination was performed using  thin barium.   FLUOROSCOPY TIME:  Fluoroscopy Time:  54 seconds   Radiation Exposure Index (if provided by the fluoroscopic device): 1.5 mGy   Number of Acquired Spot Images: 0   COMPARISON:  None   FINDINGS: Lateral view with changes of neck dissection. Upon swallowing there is distortion of the hypopharynx. There is spillage early into the vallecula and Piriforms. Penetration and then near aspiration with small amount of contrast tracking just above the vocal cords seen on images which were not saved. Images shown demonstrate a small amount of contrast along the anterior larynx. At this point the study was terminated.   Incidental note is made of a moderately large CP bar.   Only a small  amount of contrast was utilized due to history of swallow dysfunction.   IMPRESSION: 1. Deep penetration and subtle signs of aspiration with thin barium did not allow for continued evaluation. Postoperative and post treatment changes associated with the oropharynx and neck. 2. Moderately large CP bar. Dedicated swallow assessment may be helpful to determine whether further esophageal evaluation  is possible. The patient is only able to swallow small amounts of barium contrast material which would make dedicated esophageal evaluation difficult in the absence of any swallow dysfunction. 3. These results were called by telephone at the time of interpretation on 01/31/2020 at 12:01 pm to provider AMY ESTERWOOD , who verbally acknowledged these results.     Electronically Signed   By: Zetta Bills M.D.  ____________________________________________ Other:   _____________________________________________ Assessment & Plan  Assessment: Encounter Diagnoses  Name Primary?  . Pharyngoesophageal dysphagia Yes  . Cervical spine arthritis   . 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  Unfortunately, that is not a problem that can be improved with endoscopic dilation.  He is at high risk for endoscopic procedures due to his difficult airway management and antiplatelet therapy from severe underlying cerebrovascular disease.  Last known motility study at [facility] did not show increased LES tone to suggest that Botox injection would be helpful.   Plan: Unfortunately, I do not have anything else to offer Alexander Duran for this very troubling problem.  Fortunately, he is able to maintain his weight even though takes him a long time to have meals, and he is having his protein calorie supplement.  If things ever got to the point where he was unable to take insufficient nutrition to maintain his weight, he would need a feeding tube placement.  He asked if there was any potential surgical therapy for his cervical arthritis, but I am afraid I do not know the answer.  He had a hip replacement earlier this year with Dr. Alvan Dame,, and I asked him to pose this question to that physician.  I will send my  note to them.   30 minutes were spent on this encounter (including extensive chart review, history/exam, counseling/coordination of care, and documentation)  Nelida Meuse III

## 2021-04-26 ENCOUNTER — Other Ambulatory Visit (HOSPITAL_COMMUNITY): Payer: Self-pay

## 2021-04-26 ENCOUNTER — Ambulatory Visit (INDEPENDENT_AMBULATORY_CARE_PROVIDER_SITE_OTHER): Payer: No Typology Code available for payment source | Admitting: Internal Medicine

## 2021-04-26 ENCOUNTER — Encounter: Payer: Self-pay | Admitting: Internal Medicine

## 2021-04-26 ENCOUNTER — Other Ambulatory Visit: Payer: Self-pay

## 2021-04-26 ENCOUNTER — Ambulatory Visit (INDEPENDENT_AMBULATORY_CARE_PROVIDER_SITE_OTHER): Payer: Medicare Other

## 2021-04-26 DIAGNOSIS — R1314 Dysphagia, pharyngoesophageal phase: Secondary | ICD-10-CM | POA: Diagnosis not present

## 2021-04-26 DIAGNOSIS — R1310 Dysphagia, unspecified: Secondary | ICD-10-CM | POA: Insufficient documentation

## 2021-04-26 DIAGNOSIS — R918 Other nonspecific abnormal finding of lung field: Secondary | ICD-10-CM

## 2021-04-26 DIAGNOSIS — R059 Cough, unspecified: Secondary | ICD-10-CM

## 2021-04-26 IMAGING — DX DG CHEST 2V
3 series · 3 of 3 positions shown · non-contrast
Comparison: [DATE] [DATE], [DATE].  [DATE] [DATE], [DATE].

CLINICAL DATA: Chronic aspiration.  Possible lung abscess.

EXAM:
CHEST - 2 VIEW

[chest pa (1 of 2)]
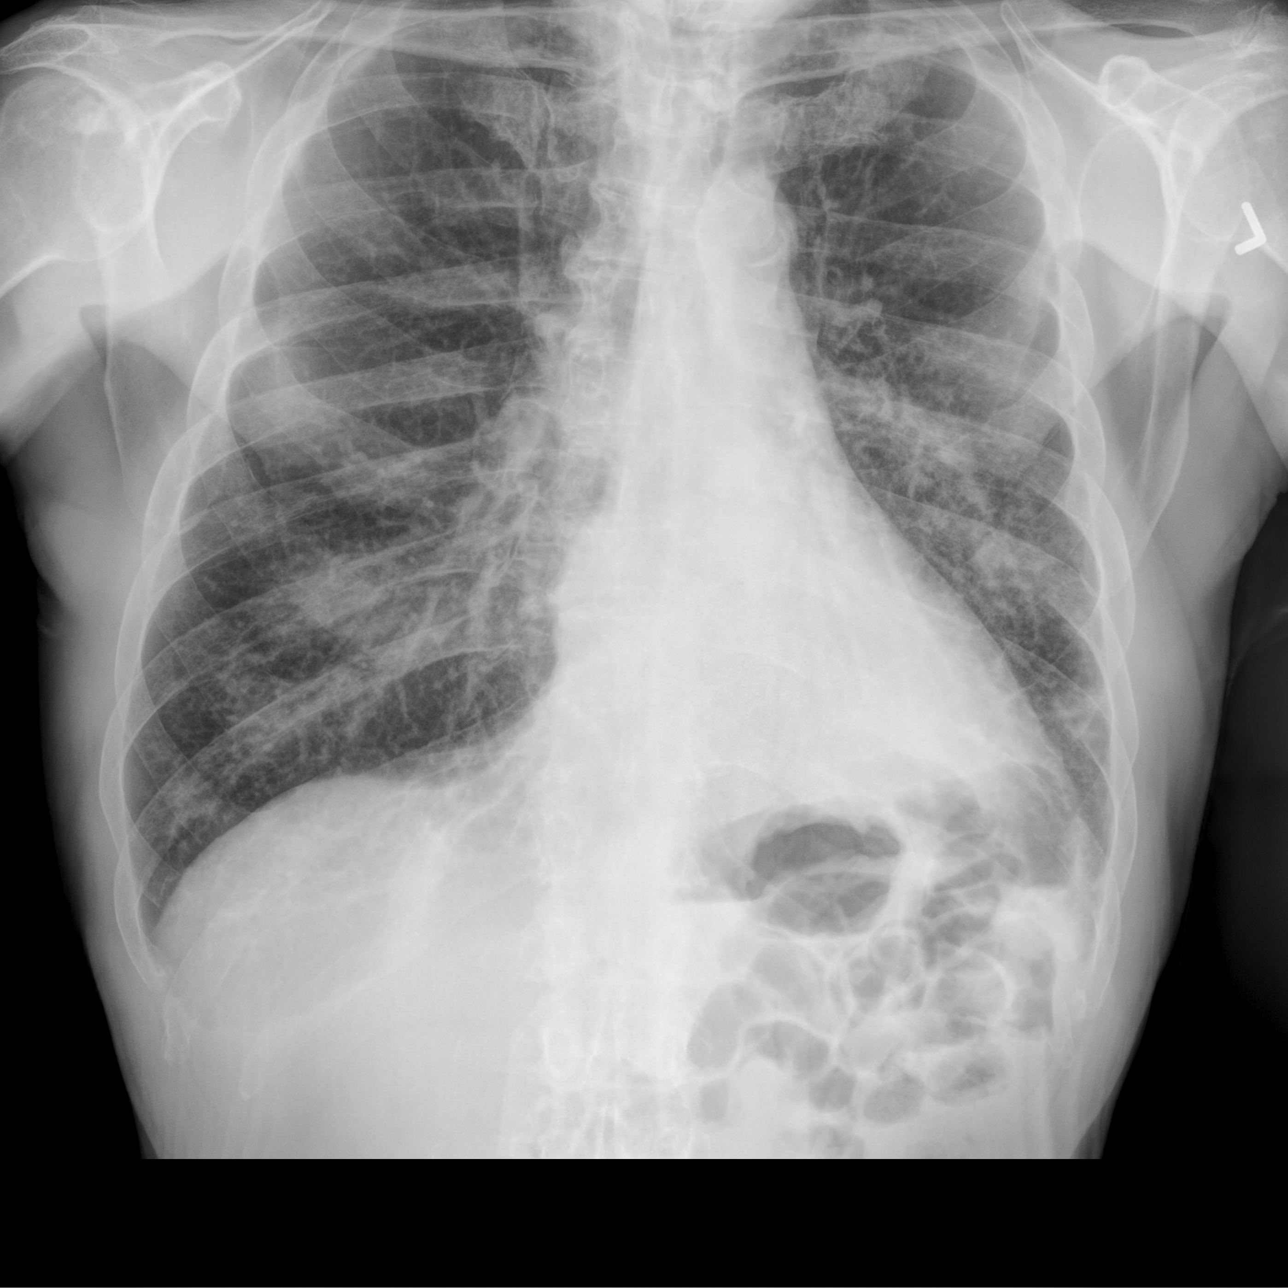

[chest pa (2 of 2)]
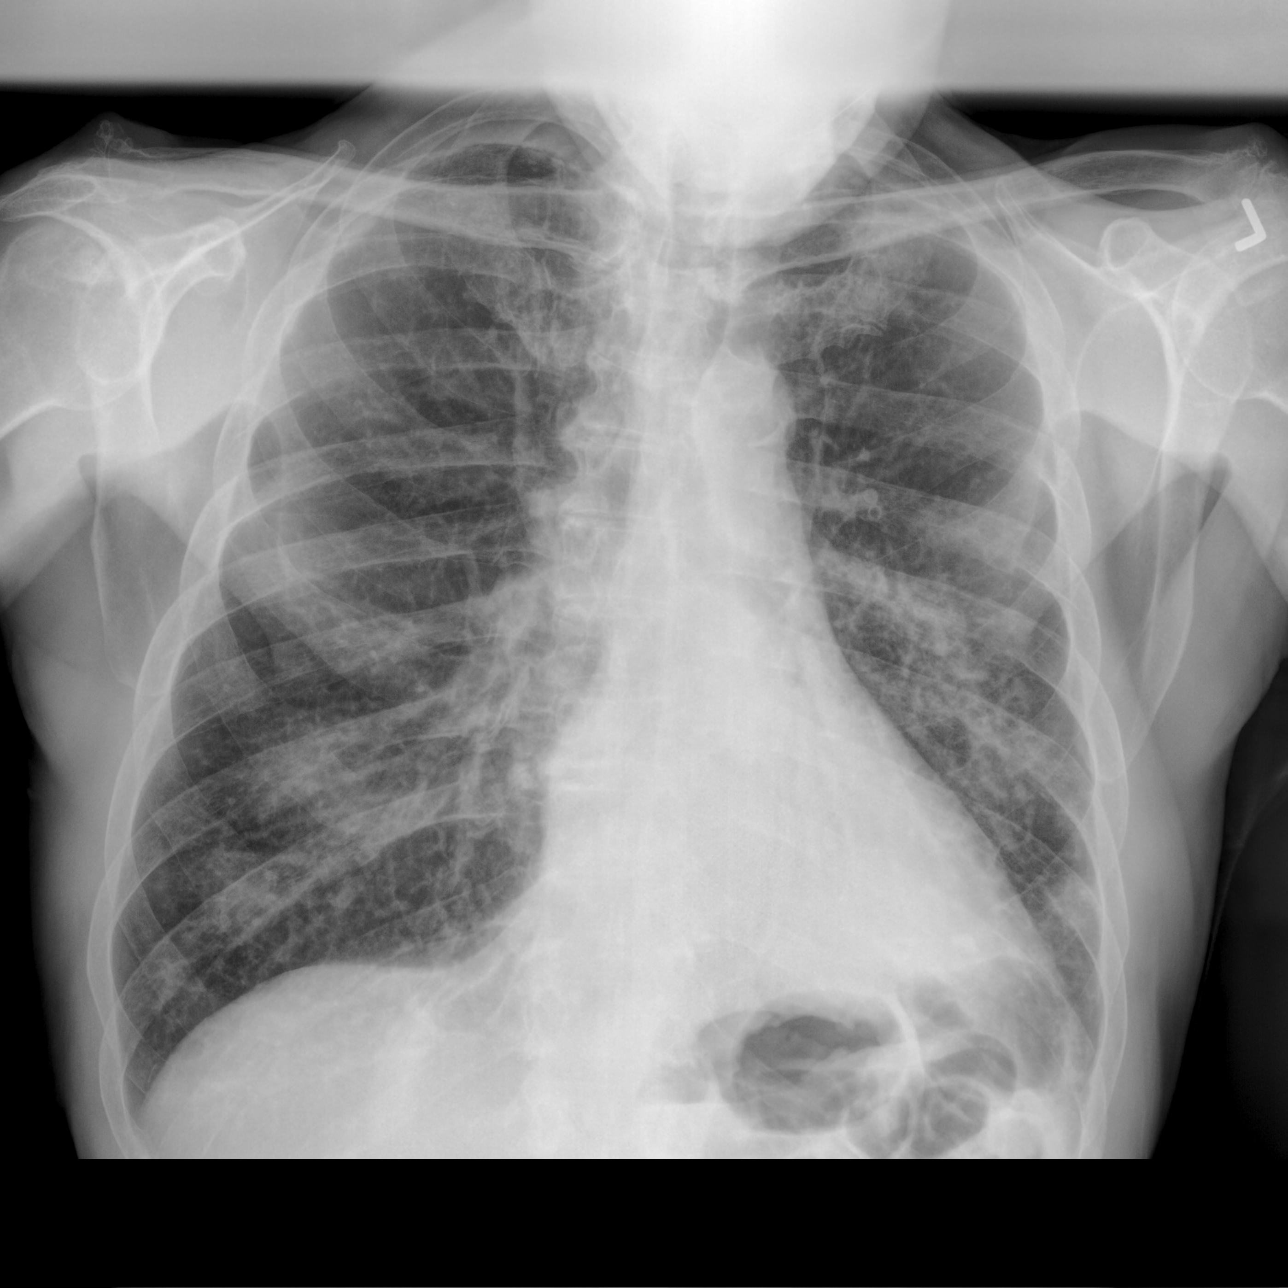

[chest lat]
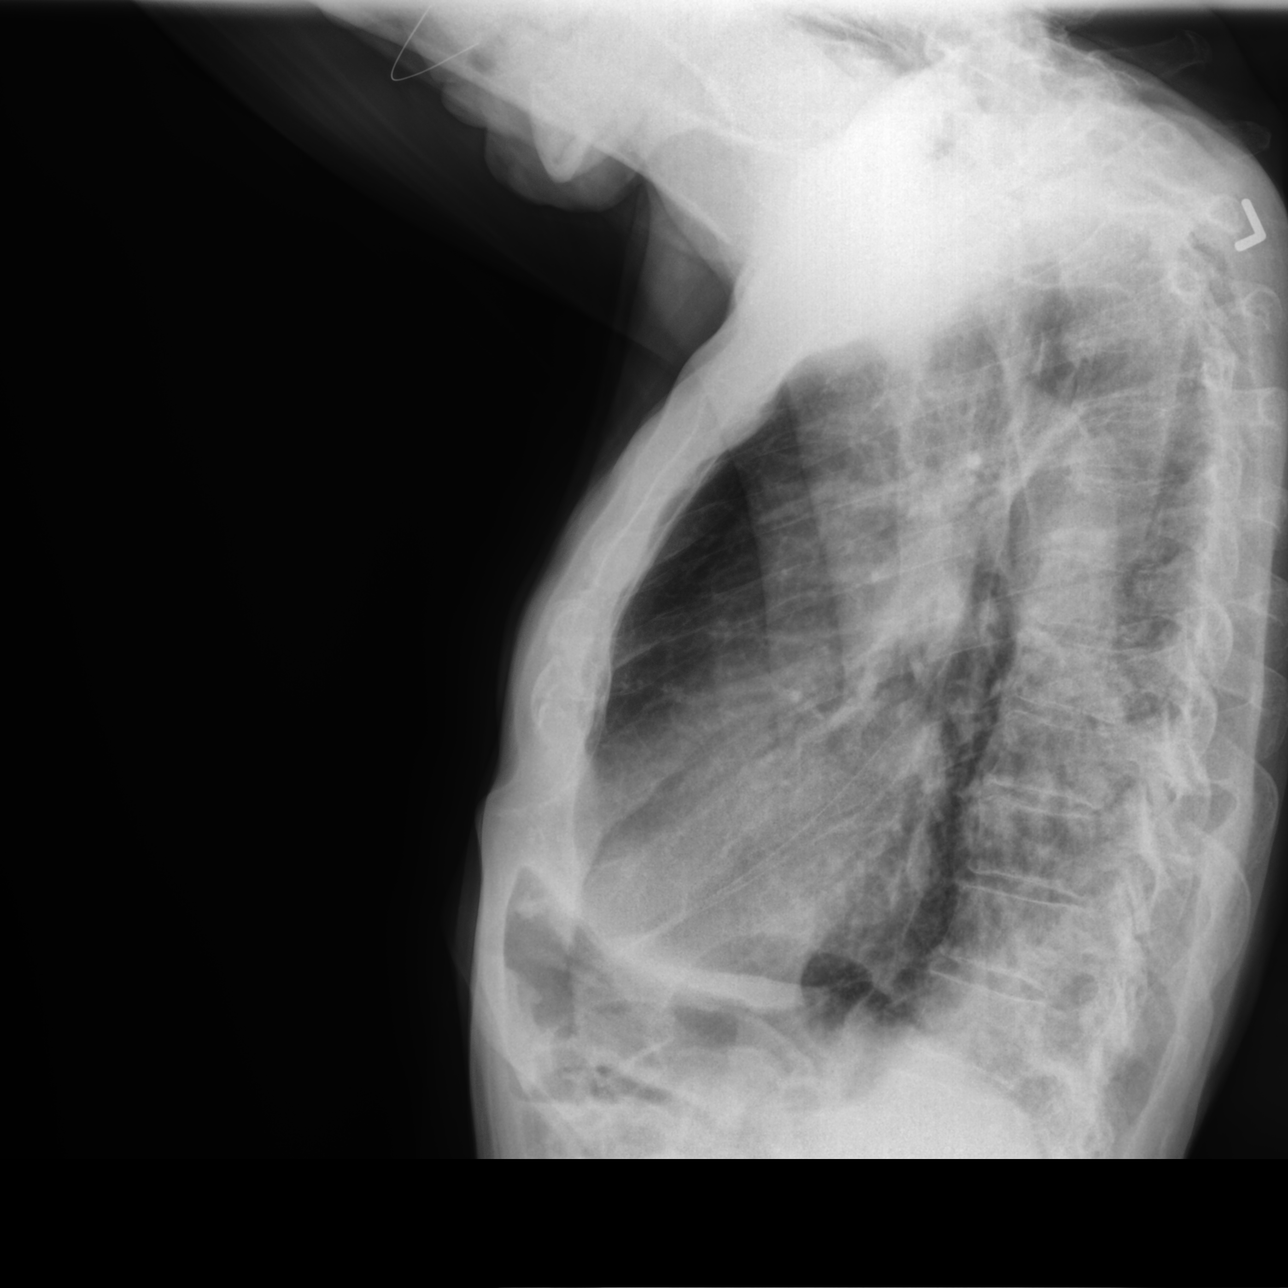

[3 of 3 positions shown; findings below may reference images not displayed]

FINDINGS: Stable cardiomediastinal silhouette. Increased bibasilar opacities
are noted concerning for possible pneumonia. No pneumothorax or
pleural effusion is noted. Bony thorax is noted.
IMPRESSION: Increased bibasilar opacities are noted concerning for possible
multifocal pneumonia.

## 2021-04-26 NOTE — Patient Instructions (Addendum)
We will call to schedule you a swallowing evaluation to see if there are any dietary measures that are better for you and if there are no good options then I would get the [facility] to [facility] for you to have a feeding tube or hospice referral    Please remember to go to the  x-ray department  for your tests - we will call you with the results when they are available     Pulmonary follow up will be as needed - inhalers are unlikely to help the problem but  you can certainly try albuterol up to 2 puff every 4 hours if your feel there any benefit.

## 2021-04-26 NOTE — Progress Notes (Addendum)
Alexander Duran, male    DOB: 1946-05-08,   MRN: [id number]   Brief patient profile:  89 yowm quit smoking 2002 s/p Radical neck on R and RT and chronic dysyphagia  referred to pulmonary clinic 04/26/2021 by [facility]  for progressive pulmonary infiltrates.      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  Unfortunately, that is not a problem that can be improved with endoscopic dilation.  He is at high risk for endoscopic procedures due to his difficult airway management and antiplatelet therapy from severe underlying cerebrovascular disease.  Last known motility study at [facility] did not show increased LES tone to suggest that Botox injection would be helpful.    History of Present Illness  04/26/2021  Pulmonary/ 1st office eval/Sianni Cloninger  Chief Complaint  Patient presents with   Pulmonary Consult    Referred by Dr Windy Fast. Former pt of Dr Gwenette Greet at the [place]. He has some dysphagia and coughs after eating. His sputum has been clear.   Dyspnea: able to push a mower s stopping for up to an hour  Cough: whatever he's been eating - no night time or early am cough  Sleep: props up on pillows on couch  SABA use: has not tried. Has  been offered Feeding tube but refused    No obvious day to day or daytime variability or assoc  purulent sputum or mucus plugs or hemoptysis or cp or chest tightness, subjective wheeze or overt sinus or hb symptoms.   Also denies any obvious fluctuation of symptoms with weather or environmental changes or other aggravating or alleviating factors except as outlined above   No unusual exposure hx or h/o childhood pna/ asthma or knowledge of premature birth.  Current Allergies, Complete Past Medical History, Past Surgical History, Family History, and Social History were reviewed in  [employer] record.  ROS  The following are not active complaints unless bolded Hoarseness, sore throat, dysphagia, dental problems, itching, sneezing,  nasal congestion or discharge of excess mucus or purulent secretions, ear ache,   fever, chills, sweats, unintended wt loss or wt gain, classically pleuritic or exertional cp,  orthopnea pnd or arm/hand swelling  or leg swelling, presyncope, palpitations, abdominal pain, anorexia, nausea, vomiting, diarrhea  or change in bowel habits or change in bladder habits, change in stools or change in urine, dysuria, hematuria,  rash, arthralgias, visual complaints, headache, numbness, weakness or ataxia or problems with walking or coordination,  change in mood or  memory.           Past Medical History:  Diagnosis Date   Arthritis    Cancer ([place])    Tonsil cancer   Carotid artery stenosis without cerebral infarction 08/30/2015   Difficulty in swallowing    History of radiation therapy    Hypertension    Hypothyroidism 05/09/2014   Nausea in adult 08/10/2015   Other headache syndrome 08/10/2015   Sleep apnea    Thyroid disease    Weight loss 08/08/2015    Outpatient Medications Prior to Visit  Medication Sig Dispense Refill   albuterol (VENTOLIN HFA) 108 (90 Base) MCG/ACT inhaler Inhale 2 puffs into the lungs every 6 (six) hours as needed for wheezing or shortness of breath.     amLODipine (NORVASC) 5 MG tablet Take 5 mg by mouth daily.  aspirin 81 MG chewable tablet Chew 81 mg by mouth every evening.      atorvastatin (LIPITOR) 20 MG tablet Take 20 mg by mouth daily.     levothyroxine (SYNTHROID, LEVOTHROID) 75 MCG tablet Take 75 mcg by mouth daily before breakfast.      OVER THE COUNTER MEDICATION TheraTears Liquid Eye Drops- Place 1-2 drops into both eyes one to three times as day as needed for dryness or irritation     ticagrelor (BRILINTA) 60 MG TABS tablet Take 60 mg by mouth daily.     No facility-administered  medications prior to visit.     Objective:     BP 130/60 (BP Location: Left Arm, Cuff Size: Normal)   Pulse (!) 58   Temp 98.1 F (36.7 C) (Oral)   Ht 5\' 7"  (1.702 m)   Wt 129 lb (58.5 kg)   SpO2 93% Comment: on RA  BMI 20.20 kg/m   SpO2: 93 % (on RA)  Hoarse chronically ill wm / full dentures    HEENT : pt wearing mask not removed for exam due to covid -19 concerns. But has classic R neck changes c/w prior radical neck    NECK :  without JVD/Nodes/TM/ nl carotid upstrokes bilaterally   LUNGS: no acc muscle use,  Nl contour chest with amphoric  bs in the L base    CV:  RRR  no s3 or murmur or increase in P2, and no edema   ABD:  soft and nontender with nl inspiratory excursion in the supine position. No bruits or organomegaly appreciated, bowel sounds nl  MS:  Nl gait/ ext warm with min muscle wasting  - no calf tenderness, cyanosis or clubbing No obvious joint restrictions   SKIN: warm and dry without lesions    NEURO:  alert, approp, nl sensorium with  no motor or cerebellar deficits apparent.    CXR PA and Lateral:   04/26/2021 :    I personally reviewed images and agree with radiology impression as follows:    Increased bibasilar opacities are noted concerning for possible multifocal pneumonia.        Assessment   Pulmonary infiltrates on CXR Chronic aspiration  since ? Around 2010 - [facility] Apr 12 2021 c/w aspiratoin bronchiolitis/pna per report  - 04/26/2021   Acute changes in L base clinically and radiographically c/w asp pna rx  With augmentin 875 mg bid solutions x 21 days and eval swallowing (see sep a/p)  Classic acute on chronic asp pna pattern and unfortunately little to offer here besides feeding tube  Vs hospice approach   Dysphagia S/p radical neck on R and RT 2002 with onset of dysphagia around 2010 progressively worse > asp pna dx 04/26/2021  - referred to ST for MBS   Options are extremely limited if can't find better diet as pt not  willing at this point to consider Feed tube of any kind   Discussed in detail all the  indications, usual  risks and alternatives  relative to the benefits with patient who agrees to proceed with w/u as outlined.            Each maintenance medication was reviewed in detail including emphasizing most importantly the difference between maintenance and prns and under what circumstances the prns are to be triggered using an action plan format where appropriate.  Total time for H and P, chart review, counseling, reviewing limited options and generating customized AVS unique to this new pt office  visit / same day charting = 46 min           Christinia Gully, MD 04/29/2021

## 2021-04-29 ENCOUNTER — Encounter: Payer: Self-pay | Admitting: Internal Medicine

## 2021-04-29 NOTE — Assessment & Plan Note (Addendum)
S/p radical neck on R and RT 2002 with onset of dysphagia around 2010 progressively worse > asp pna dx 04/26/2021  - referred to ST for MBS   Options are extremely limited if can't find better diet as pt not willing at this point to consider Feed tube of any kind   Discussed in detail all the  indications, usual  risks and alternatives  relative to the benefits with patient who agrees to proceed with w/u as outlined.            Each maintenance medication was reviewed in detail including emphasizing most importantly the difference between maintenance and prns and under what circumstances the prns are to be triggered using an action plan format where appropriate.  Total time for H and P, chart review, counseling, reviewing limited options and generating customized AVS unique to this new pt office visit / same day charting = 46 min

## 2021-04-29 NOTE — Assessment & Plan Note (Addendum)
Chronic aspiration  since ? Around 2010 - [facility] Apr 12 2021 c/w aspiratoin bronchiolitis/pna per report  - 04/26/2021   Acute changes in L base clinically and radiographically c/w asp pna rx  With augmentin 875 mg bid solutions x 21 days and eval swallowing (see sep a/p)  Classic acute on chronic asp pna pattern and unfortunately little to offer here besides feeding tube  Vs hospice approach

## 2021-05-02 ENCOUNTER — Telehealth: Payer: Self-pay | Admitting: Internal Medicine

## 2021-05-02 ENCOUNTER — Other Ambulatory Visit: Payer: Self-pay | Admitting: Internal Medicine

## 2021-05-02 MED ORDER — AMOXICILLIN-POT CLAVULANATE 250-62.5 MG/5ML PO SUSR: 875.0000 mg | Freq: Two times a day (BID) | ORAL | 0 refills | Status: AC

## 2021-05-02 NOTE — Telephone Encounter (Signed)
MW please advise. Please see message from pharmacist. Thanks :)

## 2021-05-02 NOTE — Progress Notes (Signed)
Pt notified of results/recs and rx was sent to pharm

## 2021-05-02 NOTE — Telephone Encounter (Signed)
Called and spoke with Alexander Duran at the pharmacy and she is aware of MW recs to change to the dose that they have.  This has been done and nothing further is needed.

## 2021-05-02 NOTE — Telephone Encounter (Signed)
That's fine

## 2021-05-09 ENCOUNTER — Ambulatory Visit (HOSPITAL_COMMUNITY)
Admission: RE | Admit: 2021-05-09 | Discharge: 2021-05-09 | Disposition: A | Discharge status: Home / Self Care | Payer: Medicare Other | Source: Ambulatory Visit | Attending: Internal Medicine | Admitting: Internal Medicine

## 2021-05-09 ENCOUNTER — Other Ambulatory Visit: Payer: Self-pay

## 2021-05-09 ENCOUNTER — Other Ambulatory Visit: Payer: Self-pay | Admitting: Internal Medicine

## 2021-05-09 DIAGNOSIS — R1314 Dysphagia, pharyngoesophageal phase: Secondary | ICD-10-CM | POA: Insufficient documentation

## 2021-05-09 DIAGNOSIS — R1310 Dysphagia, unspecified: Secondary | ICD-10-CM | POA: Insufficient documentation

## 2021-05-09 DIAGNOSIS — R059 Cough, unspecified: Secondary | ICD-10-CM | POA: Diagnosis present

## 2021-05-09 IMAGING — RF DG SWALLOWING FUNCTION
8 series · 19 of 24 positions shown · non-contrast
Comparison: None.

CLINICAL DATA: Dysphagia. Cough/GE reflux disease/other secondary
diagnosis

EXAM:
MODIFIED BARIUM SWALLOW
TECHNIQUE: Different consistencies of barium were administered orally to the
patient by the Speech Pathologist. Imaging of the pharynx was
performed in the lateral projection. The radiologist was present in
the fluoroscopy room for this study, providing personal supervision.
FLUOROSCOPY TIME:  Radiation Exposure Index (if provided by the
fluoroscopic device): 2.2 mGy.

[Series 1: cp_standard · 0.34mm/px · 2 of 97 frames shown (1 of 8)]
[frame 1/97]
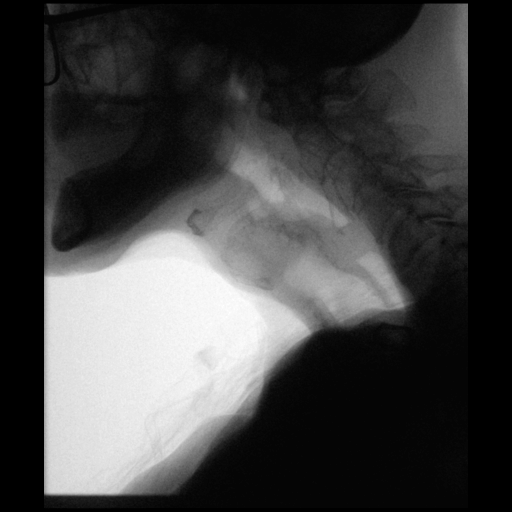
[frame 15/97]
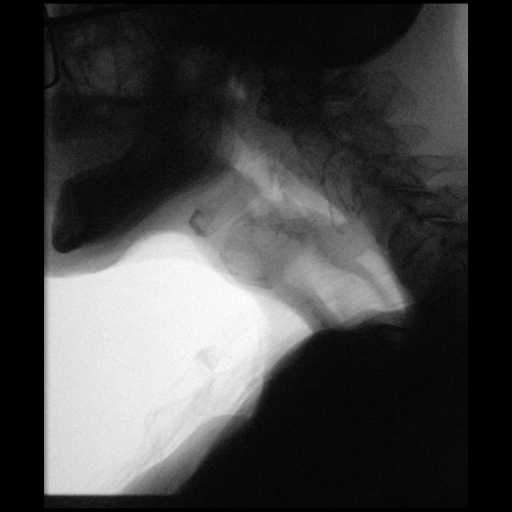

[Series 2: cp_standard · 0.34mm/px · 3 of 230 frames shown (2 of 8)]
[frame 35/230]
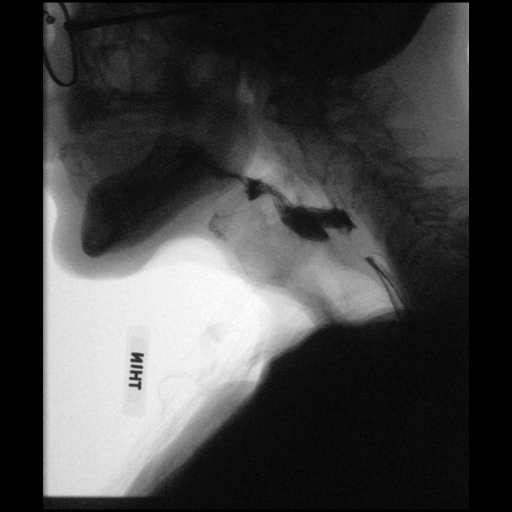
[frame 116/230]
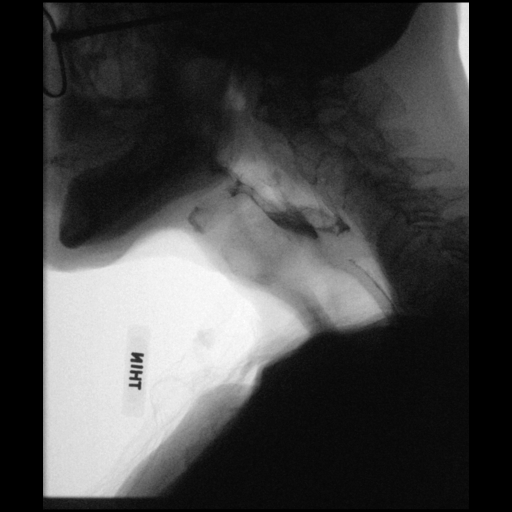
[frame 196/230]
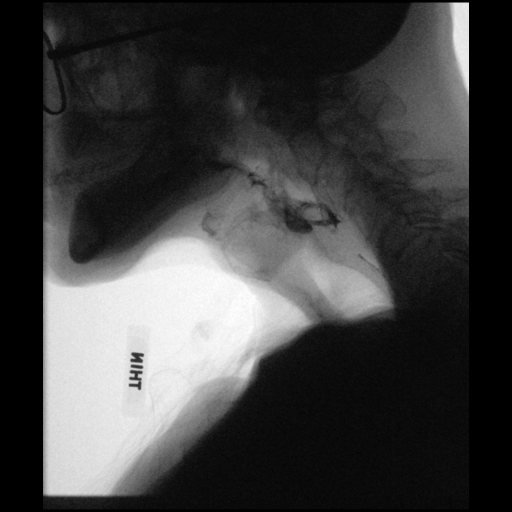

[Series 3: cp_standard · 0.34mm/px · 2 of 202 frames shown (3 of 8)]
[frame 31/202]
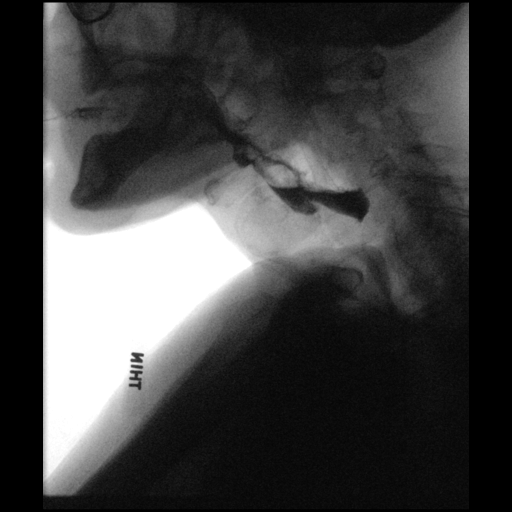
[frame 192/202]
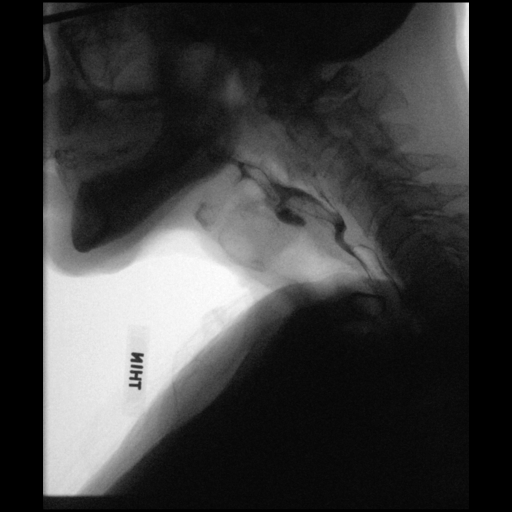

[Series 4: cp_standard · 0.34mm/px · 2 of 113 frames shown (4 of 8)]
[frame 17/113]
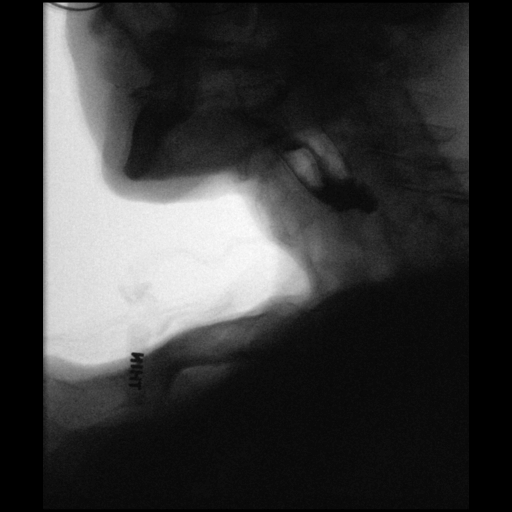
[frame 20/113]
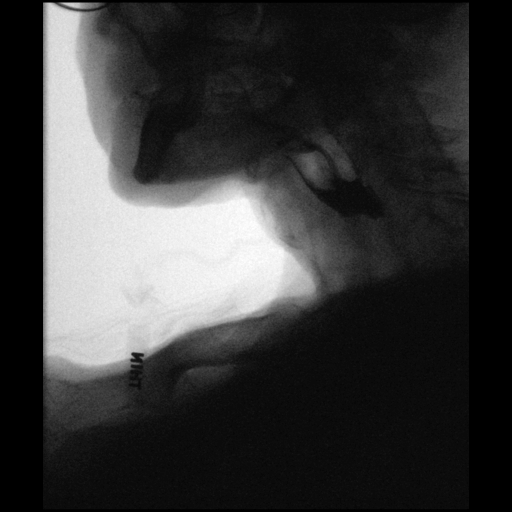

[Series 5: cp_standard · 0.34mm/px · 3 of 214 frames shown (5 of 8)]
[frame 33/214]
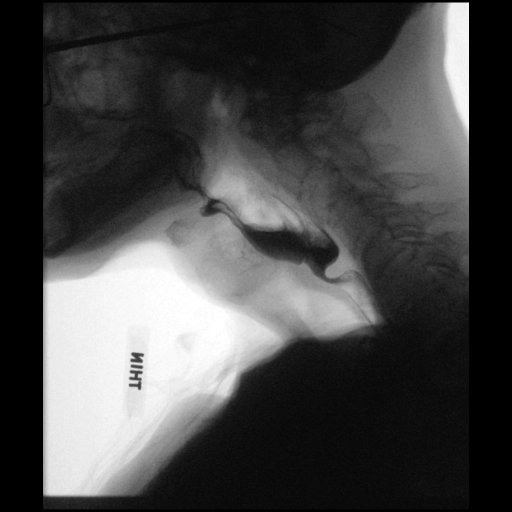
[frame 108/214]
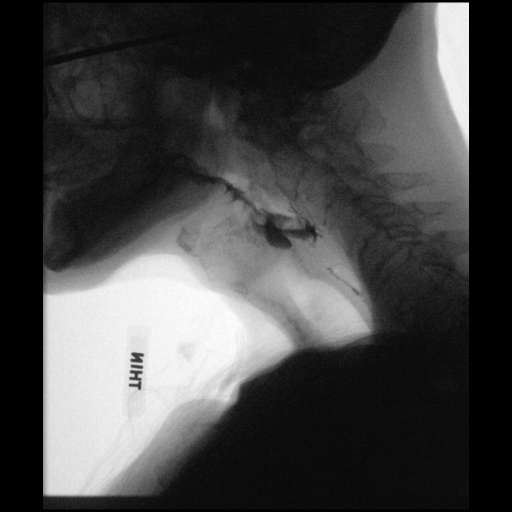
[frame 182/214]
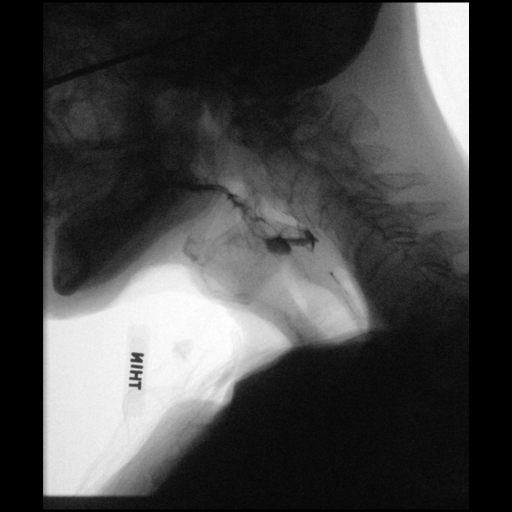

[Series 6: cp_standard · 0.34mm/px · 2 of 208 frames shown (6 of 8)]
[frame 3/208]
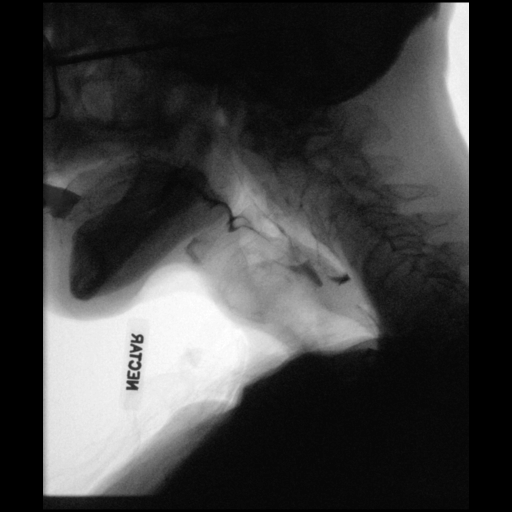
[frame 177/208]
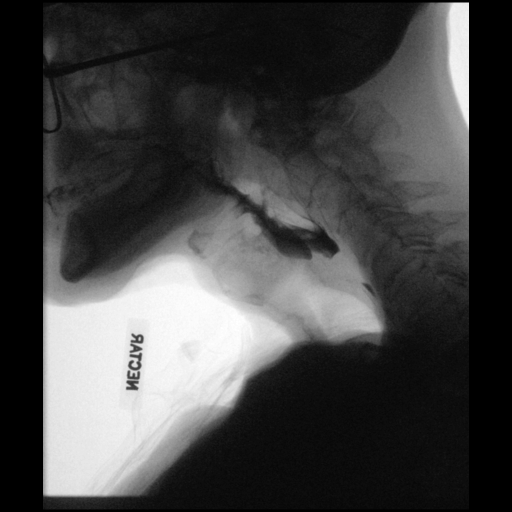

[Series 7: cp_standard · 0.34mm/px · 3 of 330 frames shown (7 of 8)]
[frame 50/330]
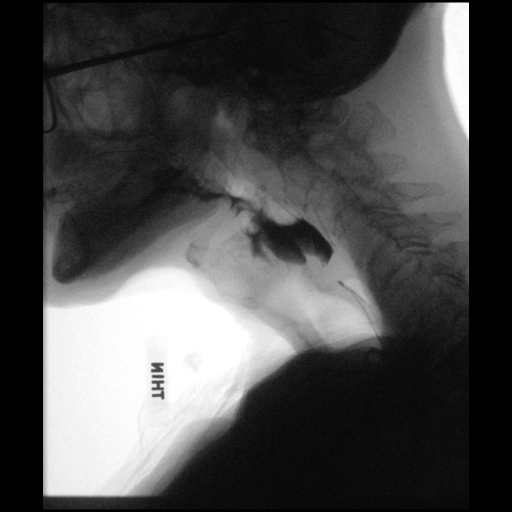
[frame 265/330]
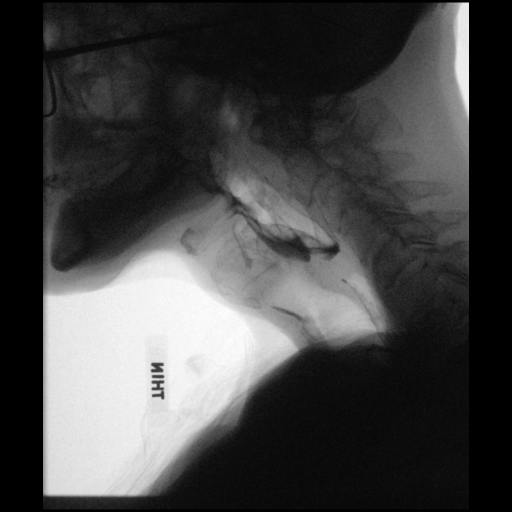
[frame 281/330]
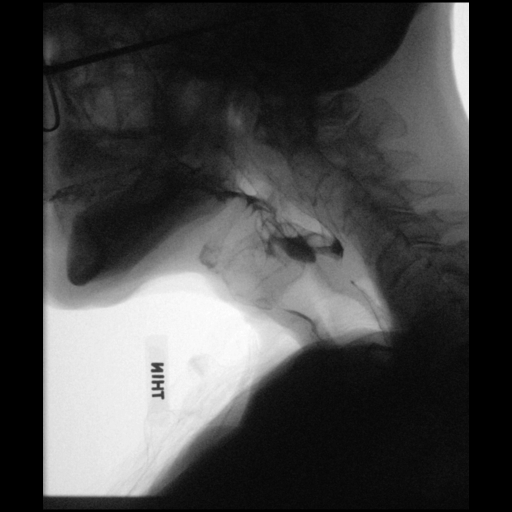

[Series 8: cp_standard · 0.34mm/px · 2 of 105 frames shown (8 of 8)]
[frame 84/105]
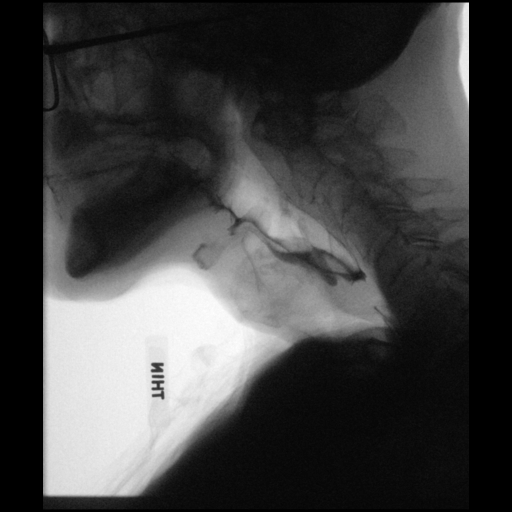
[frame 90/105]
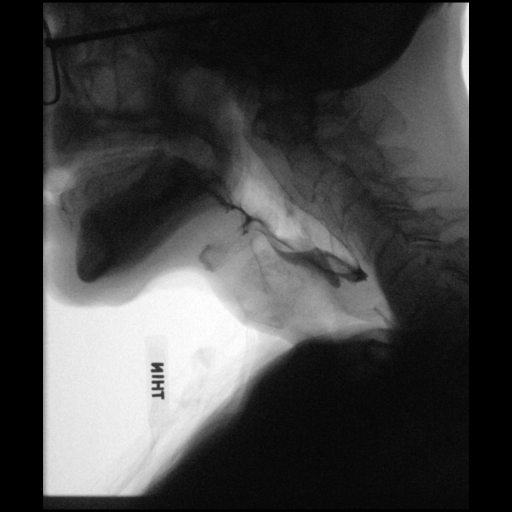

[19 of 24 positions shown; findings below may reference images not displayed]

FINDINGS: Fluoroscopic guidance was provided while speech pathologist
performed modified barium swallow examination.
IMPRESSION: Fluoroscopic guidance was provided while speech pathologist
performed modified barium swallow examination.

Please refer to the Speech Pathologists report for complete details
and recommendations.

## 2021-06-17 ENCOUNTER — Other Ambulatory Visit: Payer: Self-pay

## 2021-06-17 ENCOUNTER — Emergency Department (HOSPITAL_COMMUNITY): Payer: Medicare Other

## 2021-06-17 ENCOUNTER — Inpatient Hospital Stay (HOSPITAL_COMMUNITY)
Admission: EM | Admit: 2021-06-17 | Discharge: 2021-06-27 | DRG: 177 | Disposition: A | Discharge status: Home Health | Payer: Medicare Other | Attending: Internal Medicine | Admitting: Internal Medicine

## 2021-06-17 ENCOUNTER — Encounter (HOSPITAL_COMMUNITY): Payer: Self-pay | Admitting: Emergency Medicine

## 2021-06-17 DIAGNOSIS — I959 Hypotension, unspecified: Secondary | ICD-10-CM | POA: Diagnosis not present

## 2021-06-17 DIAGNOSIS — J690 Pneumonitis due to inhalation of food and vomit: Principal | ICD-10-CM | POA: Diagnosis present

## 2021-06-17 DIAGNOSIS — R1312 Dysphagia, oropharyngeal phase: Secondary | ICD-10-CM | POA: Diagnosis present

## 2021-06-17 DIAGNOSIS — Z801 Family history of malignant neoplasm of trachea, bronchus and lung: Secondary | ICD-10-CM | POA: Diagnosis not present

## 2021-06-17 DIAGNOSIS — R188 Other ascites: Secondary | ICD-10-CM | POA: Diagnosis present

## 2021-06-17 DIAGNOSIS — Z6821 Body mass index (BMI) 21.0-21.9, adult: Secondary | ICD-10-CM

## 2021-06-17 DIAGNOSIS — E8809 Other disorders of plasma-protein metabolism, not elsewhere classified: Secondary | ICD-10-CM | POA: Diagnosis present

## 2021-06-17 DIAGNOSIS — Y842 Radiological procedure and radiotherapy as the cause of abnormal reaction of the patient, or of later complication, without mention of misadventure at the time of the procedure: Secondary | ICD-10-CM | POA: Diagnosis present

## 2021-06-17 DIAGNOSIS — Z96642 Presence of left artificial hip joint: Secondary | ICD-10-CM | POA: Diagnosis present

## 2021-06-17 DIAGNOSIS — K220 Achalasia of cardia: Secondary | ICD-10-CM | POA: Diagnosis present

## 2021-06-17 DIAGNOSIS — Z7189 Other specified counseling: Secondary | ICD-10-CM | POA: Diagnosis not present

## 2021-06-17 DIAGNOSIS — R1310 Dysphagia, unspecified: Secondary | ICD-10-CM | POA: Diagnosis not present

## 2021-06-17 DIAGNOSIS — Z87891 Personal history of nicotine dependence: Secondary | ICD-10-CM

## 2021-06-17 DIAGNOSIS — Z515 Encounter for palliative care: Secondary | ICD-10-CM | POA: Diagnosis not present

## 2021-06-17 DIAGNOSIS — C099 Malignant neoplasm of tonsil, unspecified: Secondary | ICD-10-CM

## 2021-06-17 DIAGNOSIS — E162 Hypoglycemia, unspecified: Secondary | ICD-10-CM | POA: Diagnosis not present

## 2021-06-17 DIAGNOSIS — J9691 Respiratory failure, unspecified with hypoxia: Secondary | ICD-10-CM | POA: Diagnosis present

## 2021-06-17 DIAGNOSIS — Z79890 Hormone replacement therapy (postmenopausal): Secondary | ICD-10-CM

## 2021-06-17 DIAGNOSIS — E861 Hypovolemia: Secondary | ICD-10-CM | POA: Diagnosis present

## 2021-06-17 DIAGNOSIS — I10 Essential (primary) hypertension: Secondary | ICD-10-CM | POA: Diagnosis present

## 2021-06-17 DIAGNOSIS — I6523 Occlusion and stenosis of bilateral carotid arteries: Secondary | ICD-10-CM

## 2021-06-17 DIAGNOSIS — E871 Hypo-osmolality and hyponatremia: Secondary | ICD-10-CM | POA: Diagnosis present

## 2021-06-17 DIAGNOSIS — R638 Other symptoms and signs concerning food and fluid intake: Secondary | ICD-10-CM | POA: Diagnosis not present

## 2021-06-17 DIAGNOSIS — R0609 Other forms of dyspnea: Secondary | ICD-10-CM | POA: Diagnosis not present

## 2021-06-17 DIAGNOSIS — Z66 Do not resuscitate: Secondary | ICD-10-CM | POA: Diagnosis present

## 2021-06-17 DIAGNOSIS — E039 Hypothyroidism, unspecified: Secondary | ICD-10-CM | POA: Diagnosis present

## 2021-06-17 DIAGNOSIS — Z7982 Long term (current) use of aspirin: Secondary | ICD-10-CM

## 2021-06-17 DIAGNOSIS — Z85818 Personal history of malignant neoplasm of other sites of lip, oral cavity, and pharynx: Secondary | ICD-10-CM

## 2021-06-17 DIAGNOSIS — U071 COVID-19: Secondary | ICD-10-CM | POA: Diagnosis present

## 2021-06-17 DIAGNOSIS — Z923 Personal history of irradiation: Secondary | ICD-10-CM

## 2021-06-17 DIAGNOSIS — R1314 Dysphagia, pharyngoesophageal phase: Secondary | ICD-10-CM | POA: Diagnosis not present

## 2021-06-17 DIAGNOSIS — J1282 Pneumonia due to coronavirus disease 2019: Secondary | ICD-10-CM | POA: Diagnosis present

## 2021-06-17 DIAGNOSIS — R109 Unspecified abdominal pain: Secondary | ICD-10-CM | POA: Diagnosis present

## 2021-06-17 DIAGNOSIS — J310 Chronic rhinitis: Secondary | ICD-10-CM | POA: Diagnosis present

## 2021-06-17 DIAGNOSIS — Z825 Family history of asthma and other chronic lower respiratory diseases: Secondary | ICD-10-CM | POA: Diagnosis not present

## 2021-06-17 DIAGNOSIS — M1990 Unspecified osteoarthritis, unspecified site: Secondary | ICD-10-CM | POA: Diagnosis present

## 2021-06-17 DIAGNOSIS — E43 Unspecified severe protein-calorie malnutrition: Secondary | ICD-10-CM | POA: Diagnosis present

## 2021-06-17 DIAGNOSIS — Z28310 Unvaccinated for covid-19: Secondary | ICD-10-CM

## 2021-06-17 DIAGNOSIS — I779 Disorder of arteries and arterioles, unspecified: Secondary | ICD-10-CM | POA: Diagnosis present

## 2021-06-17 DIAGNOSIS — E46 Unspecified protein-calorie malnutrition: Secondary | ICD-10-CM | POA: Diagnosis not present

## 2021-06-17 DIAGNOSIS — G4733 Obstructive sleep apnea (adult) (pediatric): Secondary | ICD-10-CM | POA: Diagnosis present

## 2021-06-17 DIAGNOSIS — J189 Pneumonia, unspecified organism: Secondary | ICD-10-CM | POA: Diagnosis present

## 2021-06-17 DIAGNOSIS — Z79899 Other long term (current) drug therapy: Secondary | ICD-10-CM

## 2021-06-17 LAB — CBC WITH DIFFERENTIAL/PLATELET
Abs Immature Granulocytes: 0.02 10*3/uL (ref 0.00–0.07)
Basophils Absolute: 0 10*3/uL (ref 0.0–0.1)
Basophils Relative: 0 %
Eosinophils Absolute: 0 10*3/uL (ref 0.0–0.5)
Eosinophils Relative: 0 %
HCT: 31.7 % — ABNORMAL LOW (ref 39.0–52.0)
Hemoglobin: 11 g/dL — ABNORMAL LOW (ref 13.0–17.0)
Immature Granulocytes: 0 %
Lymphocytes Relative: 3 %
Lymphs Abs: 0.2 10*3/uL — ABNORMAL LOW (ref 0.7–4.0)
MCH: 31.2 pg (ref 26.0–34.0)
MCHC: 34.7 g/dL (ref 30.0–36.0)
MCV: 89.8 fL (ref 80.0–100.0)
Monocytes Absolute: 0.7 10*3/uL (ref 0.1–1.0)
Monocytes Relative: 9 %
Neutro Abs: 7 10*3/uL (ref 1.7–7.7)
Neutrophils Relative %: 88 %
Platelets: 262 10*3/uL (ref 150–400)
RBC: 3.53 MIL/uL — ABNORMAL LOW (ref 4.22–5.81)
RDW: 13.8 % (ref 11.5–15.5)
WBC: 8 10*3/uL (ref 4.0–10.5)
nRBC: 0 % (ref 0.0–0.2)

## 2021-06-17 LAB — COMPREHENSIVE METABOLIC PANEL
ALT: 21 U/L (ref 0–44)
AST: 38 U/L (ref 15–41)
Albumin: 2.8 g/dL — ABNORMAL LOW (ref 3.5–5.0)
Alkaline Phosphatase: 54 U/L (ref 38–126)
Anion gap: 9 (ref 5–15)
BUN: 9 mg/dL (ref 8–23)
CO2: 28 mmol/L (ref 22–32)
Calcium: 8.2 mg/dL — ABNORMAL LOW (ref 8.9–10.3)
Chloride: 83 mmol/L — ABNORMAL LOW (ref 98–111)
Creatinine, Ser: 0.59 mg/dL — ABNORMAL LOW (ref 0.61–1.24)
GFR, Estimated: >60 mL/min (ref >60)
Glucose, Bld: 85 mg/dL (ref 70–99)
Potassium: 4.1 mmol/L (ref 3.5–5.1)
Sodium: 120 mmol/L — ABNORMAL LOW (ref 135–145)
Total Bilirubin: 0.6 mg/dL (ref 0.3–1.2)
Total Protein: 6.4 g/dL — ABNORMAL LOW (ref 6.5–8.1)

## 2021-06-17 LAB — RESP PANEL BY RT-PCR (FLU A&B, COVID) ARPGX2
Influenza A by PCR: NEGATIVE
Influenza B by PCR: NEGATIVE
SARS Coronavirus 2 by RT PCR: POSITIVE — AB

## 2021-06-17 LAB — PROTIME-INR
INR: 1 (ref 0.8–1.2)
Prothrombin Time: 13.3 seconds (ref 11.4–15.2)

## 2021-06-17 LAB — TROPONIN I (HIGH SENSITIVITY)
Troponin I (High Sensitivity): 16 ng/L (ref <18)
Troponin I (High Sensitivity): 8 ng/L (ref <18)

## 2021-06-17 LAB — APTT: aPTT: 37 seconds — ABNORMAL HIGH (ref 24–36)

## 2021-06-17 LAB — LACTIC ACID, PLASMA: Lactic Acid, Venous: 1.3 mmol/L (ref 0.5–1.9)

## 2021-06-17 MED ORDER — ALBUTEROL SULFATE HFA 108 (90 BASE) MCG/ACT IN AERS: 2.0000 | INHALATION_SPRAY | Freq: Four times a day (QID) | RESPIRATORY_TRACT | Status: DC | PRN

## 2021-06-17 MED ORDER — POLYETHYLENE GLYCOL 3350 17 G PO PACK: 17.0000 g | PACK | Freq: Every day | ORAL | Status: DC | PRN

## 2021-06-17 MED ORDER — HYDROCOD POLST-CPM POLST ER 10-8 MG/5ML PO SUER: 5.0000 mL | Freq: Two times a day (BID) | ORAL | Status: DC | PRN

## 2021-06-17 MED ORDER — GUAIFENESIN ER 600 MG PO TB12: 600.0000 mg | ORAL_TABLET | Freq: Two times a day (BID) | ORAL | Status: DC | PRN

## 2021-06-17 MED ORDER — SODIUM CHLORIDE 0.9% FLUSH
3.0000 mL | Freq: Two times a day (BID) | INTRAVENOUS | Status: DC
  Administered 2021-06-19 – 2021-06-27 (×14): 3 mL via INTRAVENOUS

## 2021-06-17 MED ORDER — ENOXAPARIN SODIUM 40 MG/0.4ML IJ SOSY
40.0000 mg | PREFILLED_SYRINGE | INTRAMUSCULAR | Status: DC
  Administered 2021-06-17 – 2021-06-27 (×11): 40 mg via SUBCUTANEOUS
  Filled 2021-06-17 (×12): qty 0.4

## 2021-06-17 MED ORDER — ATORVASTATIN CALCIUM 10 MG PO TABS
20.0000 mg | ORAL_TABLET | Freq: Every day | ORAL | Status: DC
  Administered 2021-06-18: 11:00:00 20 mg via ORAL
  Filled 2021-06-17 (×3): qty 2

## 2021-06-17 MED ORDER — PREDNISONE 20 MG PO TABS: 50.0000 mg | ORAL_TABLET | Freq: Every day | ORAL | Status: DC

## 2021-06-17 MED ORDER — ONDANSETRON HCL 4 MG/2ML IJ SOLN: 4.0000 mg | Freq: Four times a day (QID) | INTRAMUSCULAR | Status: DC | PRN

## 2021-06-17 MED ORDER — MORPHINE SULFATE (PF) 4 MG/ML IV SOLN
4.0000 mg | Freq: Once | INTRAVENOUS | Status: AC
  Administered 2021-06-17: 4 mg via INTRAVENOUS
  Filled 2021-06-17: qty 1

## 2021-06-17 MED ORDER — METHYLPREDNISOLONE SODIUM SUCC 40 MG IJ SOLR
0.5000 mg/kg | Freq: Two times a day (BID) | INTRAMUSCULAR | Status: DC
  Administered 2021-06-17: 28.4 mg via INTRAVENOUS
  Filled 2021-06-17: qty 1

## 2021-06-17 MED ORDER — ALBUTEROL SULFATE (2.5 MG/3ML) 0.083% IN NEBU: 2.5000 mg | INHALATION_SOLUTION | RESPIRATORY_TRACT | Status: DC | PRN

## 2021-06-17 MED ORDER — AZITHROMYCIN 500 MG IV SOLR
500.0000 mg | Freq: Once | INTRAVENOUS | Status: AC
  Administered 2021-06-17: 500 mg via INTRAVENOUS
  Filled 2021-06-17: qty 5

## 2021-06-17 MED ORDER — LEVOTHYROXINE SODIUM 75 MCG PO TABS
75.0000 ug | ORAL_TABLET | Freq: Every day | ORAL | Status: DC
  Administered 2021-06-18: 11:00:00 75 ug via ORAL
  Filled 2021-06-17: qty 1

## 2021-06-17 MED ORDER — BISACODYL 5 MG PO TBEC: 5.0000 mg | DELAYED_RELEASE_TABLET | Freq: Every day | ORAL | Status: DC | PRN

## 2021-06-17 MED ORDER — SODIUM CHLORIDE 0.9 % IV SOLN
100.0000 mg | Freq: Every day | INTRAVENOUS | Status: AC
  Administered 2021-06-18 – 2021-06-21 (×4): 100 mg via INTRAVENOUS
  Filled 2021-06-17 (×2): qty 20
  Filled 2021-06-17: qty 100
  Filled 2021-06-17: qty 20

## 2021-06-17 MED ORDER — MORPHINE SULFATE (PF) 2 MG/ML IV SOLN
2.0000 mg | INTRAVENOUS | Status: DC | PRN
  Administered 2021-06-17 – 2021-06-22 (×3): 2 mg via INTRAVENOUS
  Filled 2021-06-17 (×3): qty 1

## 2021-06-17 MED ORDER — SODIUM CHLORIDE 0.9 % IV SOLN: 2.0000 g | INTRAVENOUS | Status: DC

## 2021-06-17 MED ORDER — HYDRALAZINE HCL 20 MG/ML IJ SOLN
5.0000 mg | INTRAMUSCULAR | Status: DC | PRN
  Filled 2021-06-17: qty 1

## 2021-06-17 MED ORDER — ALBUTEROL SULFATE HFA 108 (90 BASE) MCG/ACT IN AERS
2.0000 | INHALATION_SPRAY | Freq: Four times a day (QID) | RESPIRATORY_TRACT | Status: DC
  Administered 2021-06-17 – 2021-06-27 (×36): 2 via RESPIRATORY_TRACT
  Filled 2021-06-17 (×2): qty 6.7

## 2021-06-17 MED ORDER — OXYCODONE HCL 5 MG PO TABS: 5.0000 mg | ORAL_TABLET | ORAL | Status: DC | PRN

## 2021-06-17 MED ORDER — ONDANSETRON HCL 4 MG PO TABS: 4.0000 mg | ORAL_TABLET | Freq: Four times a day (QID) | ORAL | Status: DC | PRN

## 2021-06-17 MED ORDER — SODIUM CHLORIDE 0.9 % IV SOLN
3.0000 g | Freq: Four times a day (QID) | INTRAVENOUS | Status: AC
  Administered 2021-06-17 – 2021-06-23 (×23): 3 g via INTRAVENOUS
  Filled 2021-06-17 (×24): qty 8

## 2021-06-17 MED ORDER — DOCUSATE SODIUM 100 MG PO CAPS
100.0000 mg | ORAL_CAPSULE | Freq: Two times a day (BID) | ORAL | Status: DC
Start: 2021-06-17 — End: 2021-06-22
  Filled 2021-06-17 (×3): qty 1

## 2021-06-17 MED ORDER — ASPIRIN 81 MG PO CHEW
81.0000 mg | CHEWABLE_TABLET | Freq: Every evening | ORAL | Status: DC
  Filled 2021-06-17: qty 1

## 2021-06-17 MED ORDER — SODIUM CHLORIDE 0.9 % IV SOLN
200.0000 mg | Freq: Once | INTRAVENOUS | Status: AC
  Administered 2021-06-18: 200 mg via INTRAVENOUS
  Filled 2021-06-17: qty 40

## 2021-06-17 MED ORDER — AMLODIPINE BESYLATE 5 MG PO TABS: 5.0000 mg | ORAL_TABLET | Freq: Every day | ORAL | Status: DC

## 2021-06-17 MED ORDER — ACETAMINOPHEN 650 MG RE SUPP: 650.0000 mg | Freq: Four times a day (QID) | RECTAL | Status: DC | PRN

## 2021-06-17 MED ORDER — GUAIFENESIN-DM 100-10 MG/5ML PO SYRP: 10.0000 mL | ORAL_SOLUTION | ORAL | Status: DC | PRN

## 2021-06-17 MED ORDER — CEFTRIAXONE SODIUM 1 G IJ SOLR
1.0000 g | Freq: Once | INTRAMUSCULAR | Status: AC
  Administered 2021-06-17: 1 g via INTRAVENOUS
  Filled 2021-06-17: qty 10

## 2021-06-17 MED ORDER — ACETAMINOPHEN 325 MG PO TABS: 650.0000 mg | ORAL_TABLET | Freq: Four times a day (QID) | ORAL | Status: DC | PRN

## 2021-06-17 MED ORDER — SODIUM CHLORIDE 0.9 % IV SOLN: 500.0000 mg | INTRAVENOUS | Status: DC

## 2021-06-17 MED ORDER — SODIUM CHLORIDE 0.9 % IV SOLN: INTRAVENOUS | Status: DC

## 2021-06-17 MED ORDER — LACTATED RINGERS IV BOLUS (SEPSIS)
1000.0000 mL | Freq: Once | INTRAVENOUS | Status: AC
  Administered 2021-06-17: 1000 mL via INTRAVENOUS

## 2021-06-17 NOTE — Assessment & Plan Note (Signed)
Continue Norvasc

## 2021-06-17 NOTE — ED Triage Notes (Addendum)
Pt to triage via [facility] from home.  Reports increased pain in R lung since 8am and SOB.  Hx of throat CA and frequent aspiration.  Initial sats 88% on Room air. Chronic cough.  EMS- Albuterol 5mg  BP 166/74  Pt hypotensive at triage.

## 2021-06-17 NOTE — Assessment & Plan Note (Addendum)
-  Patient has had progressive dysphagia since radical neck dissection and XRT -It has gotten worse -He previously declined feeding tube -He is here with aspiration PNA - and he has known persistent aspiration with severe aspiration risk -After frank discussion, he is now willing to proceed with PEG tube -He would prefer to receive tube feeds and be NPO indefinitely rather than to proceed with hospice at this time -Will place IR consult for PEG tube placement -NPO for now -Palliative care consult

## 2021-06-17 NOTE — Progress Notes (Signed)
Pharmacy Antibiotic Note  Alexander Duran is a 76 y.o. male admitted on 06/17/2021 with  aspiration pna and COVID .  Pharmacy has been consulted for remdesivir and unasyn dosing.  Plan: Remdesivir Unasyn 3g q6h Trend WBC, Fever, Renal function, & Clinical course F/u cultures, clinical course, WBC, fever De-escalate when able  Height: 5\' 7"  (170.2 cm) Weight: 56.7 kg (125 lb) IBW/kg (Calculated) : 66.1  Temp (24hrs), Avg:98.1 F (36.7 C), Min:98.1 F (36.7 C), Max:98.1 F (36.7 C)  Recent Labs  Lab 06/17/21 1139  WBC 8.0  CREATININE 0.59*  LATICACIDVEN 1.3    Estimated Creatinine Clearance: 66 mL/min (A) (by C-G formula based on SCr of 0.59 mg/dL (L)).    Allergies  Allergen Reactions   Iodinated Contrast Media     Other reaction(s): Weakness present   Thank you for allowing pharmacy to be a part of this patients care.  Lorelei Pont, PharmD, BCPS 06/17/2021 7:47 PM ED Clinical Pharmacist -  [phone]

## 2021-06-17 NOTE — ED Provider Notes (Signed)
[facility] EMERGENCY DEPARTMENT Provider Note   CSN: [id number] Arrival date & time: 06/17/21  1039     History  Chief complaint: Shortness of breath Alexander Duran.   Shortness of Breath  Patient has complex medical history including, tonsil cancer, hypothyroidism, hypertension, chronic weight loss, carotid artery stenosis and dysphagia associated with his prior surgery for his tonsillar cancer.  Patient states he has a history of recurrent aspiration related to this illness.  He has been told that he develops pneumonia as a result of this.  According to the records patient last had an episode of pneumonia back in November.  Patient states he has persistent trouble since then.  In the last few days however the symptoms have been increasing.  Today he started developing pain in his chest and increasing shortness of breath.  He denies any fevers.  He denies any vomiting or diarrhea.  He has been coughing up yellow sputum.  Home Medications Prior to Admission medications   Medication Sig Start Date End Date Taking? Authorizing Provider  albuterol (VENTOLIN HFA) 108 (90 Base) MCG/ACT inhaler Inhale 2 puffs into the lungs every 6 (six) hours as needed for wheezing or shortness of breath.    [provider]  amLODipine (NORVASC) 5 MG tablet Take 5 mg by mouth daily.    [provider]  aspirin 81 MG chewable tablet Chew 81 mg by mouth every evening.     [provider]  atorvastatin (LIPITOR) 20 MG tablet Take 20 mg by mouth daily.    [provider]  levothyroxine (SYNTHROID, LEVOTHROID) 75 MCG tablet Take 75 mcg by mouth daily before breakfast.     [provider]  OVER THE COUNTER MEDICATION TheraTears Liquid Eye Drops- Place 1-2 drops into both eyes one to three times as day as needed for dryness or irritation    [provider]      Allergies    Patient has no known allergies.    Review of  Systems   Review of Systems  Respiratory:  Positive for shortness of breath.    Physical Exam Updated Vital Signs BP 133/80    Pulse 72    Temp 98.1 F (36.7 C) (Oral)    Resp (!) 24    Ht 1.702 m (5\' 7" )    Wt 56.7 kg    SpO2 100%    BMI 19.58 kg/m  Physical Exam Vitals and nursing note reviewed.  Constitutional:      Appearance: He is ill-appearing.     Comments: Underweight  HENT:     Head: Normocephalic and atraumatic.     Right Ear: External ear normal.     Left Ear: External ear normal.  Eyes:     General: No scleral icterus.       Right eye: No discharge.        Left eye: No discharge.     Conjunctiva/sclera: Conjunctivae normal.  Neck:     Trachea: No tracheal deviation.  Cardiovascular:     Rate and Rhythm: Normal rate and regular rhythm.  Pulmonary:     Effort: Pulmonary effort is normal. No respiratory distress.     Breath sounds: No stridor. Rales present. No wheezing.  Abdominal:     General: Bowel sounds are normal. There is no distension.     Palpations: Abdomen is soft.     Tenderness: There is no abdominal tenderness. There is no guarding or rebound.  Musculoskeletal:        General: No tenderness or deformity.     Cervical back: Neck supple.  Skin:    General: Skin is warm and dry.     Findings: No rash.  Neurological:     General: No focal deficit present.     Mental Status: He is alert.     Cranial Nerves: No cranial nerve deficit (no facial droop, extraocular movements intact, no slurred speech).     Sensory: No sensory deficit.     Motor: No abnormal muscle tone or seizure activity.     Coordination: Coordination normal.  Psychiatric:        Mood and Affect: Mood normal.    ED Results / Procedures / Treatments   Labs (all labs ordered are listed, but only abnormal results are displayed) Labs Reviewed  COMPREHENSIVE METABOLIC PANEL - Abnormal; Notable for the following components:      Result Value   Sodium 120 (*)    Chloride 83 (*)     Creatinine, Ser 0.59 (*)    Calcium 8.2 (*)    Total Protein 6.4 (*)    Albumin 2.8 (*)    All other components within normal limits  CBC WITH DIFFERENTIAL/PLATELET - Abnormal; Notable for the following components:   RBC 3.53 (*)    Hemoglobin 11.0 (*)    HCT 31.7 (*)    Lymphs Abs 0.2 (*)    All other components within normal limits  APTT - Abnormal; Notable for the following components:   aPTT 37 (*)    All other components within normal limits  CULTURE, BLOOD (ROUTINE X 2)  CULTURE, BLOOD (ROUTINE X 2)  LACTIC ACID, PLASMA  PROTIME-INR  TROPONIN I (HIGH SENSITIVITY)  TROPONIN I (HIGH SENSITIVITY)    EKG EKG Interpretation  Date/Time:  Sunday June 17 2021 11:59:34 EST Ventricular Rate:  74 PR Interval:  162 QRS Duration: 145 QT Interval:  418 QTC Calculation: 464 R Axis:   -48 Text Interpretation: Sinus rhythm RBBB and LAFB Since last tracing rate slower Confirmed by Alexander Duran [id number]) on 06/17/2021 12:07:40 PM  Radiology DG Chest Port 1 View  Result Date: 06/17/2021 CLINICAL DATA:  76 year old Duran with history of sepsis. EXAM: PORTABLE CHEST 1 VIEW COMPARISON:  Chest x-ray 04/26/2021. FINDINGS: Consolidation in the medial aspect of the left liver lobe. Diffuse peribronchial cuffing scattered throughout the lungs bilaterally. No pleural effusions. No pneumothorax. No evidence of pulmonary edema. Heart size is normal. Upper mediastinal contours are within normal limits. Atherosclerotic calcifications. IMPRESSION: 1. Bronchitis with left lower lobe pneumonia. 2. Aortic atherosclerosis. Electronically Signed   By: Vinnie Langton M.D.   On: 06/17/2021 12:08    Procedures Procedures    Medications Ordered in ED Medications  lactated ringers bolus 1,000 mL (0 mLs Intravenous Stopped 06/17/21 1301)  morphine 4 MG/ML injection 4 mg (4 mg Intravenous Given 06/17/21 1155)    ED Course/ Medical Decision Making/ A&P Clinical Course as of 06/17/21 1437  Sun Jun 17, 2021  1142  Show vitals reviewed.  Hypoxic on arrival.  Initial blood pressure was also low.  Repeat blood pressure is now normal without intervention.  Oxygen saturation mid 90s but patient is on nasal cannula 6 L [JK]  1208 DG Chest Port 1 View Chest x-ray reviewed.  Hyperinflation noted.,  Questionable infiltrate lower lung [JK]  69 Radiologist reads right lower lobe pneumonia [JK]  1335 Initial lactic acid level normal. [JK]  9735 Metabolic panel shows hyponatremia,  new compared to previous values [JK]    Clinical Course User Index [JK] Alexander Rank, MD                           Medical Decision Making  Patient presented to the ED for evaluation of cough shortness of breath.  Patient had an oxygen requirement on arrival.  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  Patient does not have any hypotension.  No signs of severe sepsis at this time.  Patient is in fluid resuscitation and IV antibiotics.  With his new oxygen requirement and pneumonia I will consult the medical service for admission.  Hyponatremia also noted.  No signs of any mental status changes.  Patient treated with IV today Ringer's..  Will need to continue to monitor.       Final Clinical Impression(s) / ED Diagnoses Final diagnoses:  Community acquired pneumonia, unspecified laterality  Hyponatremia  Dysphagia, unspecified type     Alexander Rank, MD 06/17/21 1437

## 2021-06-17 NOTE — Assessment & Plan Note (Signed)
Continue Synthroid °

## 2021-06-17 NOTE — H&P (Addendum)
History and Physical    Patient: Alexander Duran UMP:[id number] DOB: 1945-08-30 DOA: 06/17/2021 DOS: the patient was seen and examined on 06/17/2021 PCP: Windy Fast, MD  Patient coming from: Home - lives with wife; NOK: Cleatis Polka, [phone]  Chief Complaint: SOB  HPI: Alexander Duran is a 76 y.o. male with medical history significant of tonsillar cancer; carotid stenosis; dysphagia/achalasia;cricopharyngeal bar with chronic aspiration; HTN; hypothyroidism; and OSA presenting with SOB.  He has had swallowing difficulties, progressively worse, since the last 5-10 years.  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  He has a stent on the left carotid, placed in 2018.  He has been cancer free since he completed treatment.  He had an endoscopy by Dr. Loletha Carrow in 2018.  He has severe swallow dysfunction.  He saw Dr. Loletha Carrow last year and asked about esophageal dilation and he said it wasn't a good idea.  He saw a [place] (sees the doctor at the [facility] for PCP).  He has lost weight due to lack of ability to take in enough nutrition.  He aspirates and that has gotten worse most recently.  The aspiration has caused a problem in his lungs.  He last saw Dr. Melvyn Novas on 11/10 and he said if he didn't get enough nutrition or have a feeding tube, he should probably transition to hospice.  He has not been recontacted by the [place] yet about this.  He has tried to remain as active as possible; he has lost so much weight his dentures don't fit anymore and the [place] [facility] won't help with that since he is not 100% service-connected disabled.  He had a terrible pain in his right lungs this AM when he got up.  He was SOB.  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  He  is not feeling SOB now with the O2.  He was given morphine this AM for the pain and that may have made him more SOB.  No fever.  The pain is better now.   ER Course:  PNA.  Has h/o orpharyngeal cancer and dysphagia, aspiration.  Presented with CP, cough, sputum.  WBC ok.  Lactate negative.   New O2 requirement, on 4L.  Isolated low BP, fine since.  Given IVF and abx.  +hyponatremia.  CXR with PNA.  Started on Rocephin/Azithro.   Review of Systems: ROS reviewed and negative except as above Past Medical History:  Diagnosis Date   Arthritis    Cancer ([place])    Tonsil cancer   Carotid artery stenosis without cerebral infarction 08/30/2015   Difficulty in swallowing    History of radiation therapy    Hypertension    Hypothyroidism 05/09/2014   Other headache syndrome 08/10/2015   Sleep apnea    Weight loss 08/08/2015   Past Surgical History:  Procedure Laterality Date   BACK SURGERY     CAROTID PTA/STENT INTERVENTION Left 08/02/2016   Procedure: Carotid PTA/Stent Intervention;  Surgeon: Elam Dutch, MD;  Location: [facility];  Service: Cardiovascular;  Laterality: Left;   ESOPHAGOGASTRODUODENOSCOPY N/A 11/15/2016   Procedure: ESOPHAGOGASTRODUODENOSCOPY (EGD);  Surgeon: Doran Stabler, MD;  Location: [facility];  Service: Endoscopy;  Laterality: N/A;   FOREIGN BODY REMOVAL N/A 11/15/2016   Procedure: FOREIGN BODY REMOVAL;  Surgeon: Doran Stabler, MD;  Location: [facility];  Service: Endoscopy;  Laterality: N/A;   PERIPHERAL VASCULAR CATHETERIZATION N/A 07/04/2016   Procedure: Aortic Arch Angiography;  Surgeon: Conrad West Sharyland, MD;  Location: [facility];  Service: Cardiovascular;  Laterality: N/A;   PERIPHERAL VASCULAR CATHETERIZATION N/A 07/04/2016   Procedure: Carotid & cerebral  Angiography;  Surgeon: Conrad Doral, MD;  Location: [facility];  Service: Cardiovascular;  Laterality: N/A;   TOTAL HIP ARTHROPLASTY Left 10/26/2019   Procedure: TOTAL HIP  ARTHROPLASTY ANTERIOR APPROACH;  Surgeon: Paralee Cancel, MD;  Location: [facility];  Service: Orthopedics;  Laterality: Left;  70 mins   TUMOR REMOVAL     in neck   Social History:  reports that he quit smoking about 4 years ago. His smoking use included cigarettes. He has a 35.00 pack-year smoking history. He has never used smokeless tobacco. He reports that he does not currently use alcohol after a past usage of about 2.0 standard drinks per week. He reports that he does not currently use drugs after having used the following drugs: Marijuana.  Allergies  Allergen Reactions   Iodinated Contrast Media     Other reaction(s): Weakness present    Family History  Problem Relation Age of Onset   Cancer Father        lung cancer   COPD Mother    Macular degeneration Mother    Colon cancer Neg Hx    Stomach cancer Neg Hx    Pancreatic cancer Neg Hx     Prior to Admission medications   Medication Sig Start Date End Date Taking? Authorizing Provider  albuterol (VENTOLIN HFA) 108 (90 Base) MCG/ACT inhaler Inhale 2 puffs into the lungs every 6 (six) hours as needed for wheezing or shortness of breath.    [provider]  amLODipine (NORVASC) 5 MG tablet Take 5 mg by mouth daily.    [provider]  aspirin 81 MG chewable tablet Chew 81 mg by mouth every evening.     [provider]  atorvastatin (LIPITOR) 20 MG tablet Take 20 mg by mouth daily.    [provider]  levothyroxine (SYNTHROID, LEVOTHROID) 75 MCG tablet Take 75 mcg by mouth daily before breakfast.     [provider]  OVER THE COUNTER MEDICATION TheraTears Liquid Eye Drops- Place 1-2 drops into both eyes one to three times as day as needed for dryness or irritation    [provider]    Physical Exam: Vitals:   06/17/21 1445 06/17/21 1600 06/17/21 1615 06/17/21 1630  BP:  111/69 118/67 139/80  Pulse: 63 66 65 71  Resp: 11 16 11 14   Temp:      TempSrc:      SpO2: 100% 100%  100% 100%  Weight:      Height:       General:  Appears frail, chronically ill, cachectic; difficult to understand - ill-fitting dentures vs. Prior neck surgery with hoarseness Eyes:  PERRL, EOMI, normal lids, iris ENT:  grossly normal hearing, lips & tongue, mmm Neck:  no LAD, masses or thyromegaly; sunken neck without obvious abnormality Cardiovascular:  RRR, no m/r/g. No LE edema.  Respiratory:    Scattered rhonchi.  Normal to mildly increased  respiratory effort.  On [place] O2 Abdomen:  soft, NT, ND Skin:  no  rash or induration seen on limited exam Musculoskeletal:  grossly normal tone BUE/BLE, good ROM, no bony abnormality Psychiatric:  grossly normal mood and affect, speech fluent but difficult to understand, AOx3 Neurologic:  moves all extremities in coordinated fashion   Radiological Exams on Admission: Independently reviewed - see discussion in A/P where applicable  DG Chest Port 1 View  Result Date: 06/17/2021 CLINICAL DATA:  76 year old male with history of sepsis. EXAM: PORTABLE CHEST 1 VIEW COMPARISON:  Chest x-ray 04/26/2021. FINDINGS: Consolidation in the medial aspect of the left liver lobe. Diffuse peribronchial cuffing scattered throughout the lungs bilaterally. No pleural effusions. No pneumothorax. No evidence of pulmonary edema. Heart size is normal. Upper mediastinal contours are within normal limits. Atherosclerotic calcifications. IMPRESSION: 1. Bronchitis with left lower lobe pneumonia. 2. Aortic atherosclerosis. Electronically Signed   By: Vinnie Langton M.D.   On: 06/17/2021 12:08    EKG: Independently reviewed.   1049 - Sinus tachycardia with rate 114; RBBB with nonspecific ST changes 1159 - NSR with rate 74; RBBB and LAFB   Labs on Admission: I have personally reviewed the available labs and imaging studies at the time of the admission.  Pertinent labs:    Na++ 120; 132 in 10/2019 Albumin 2.8 HS troponin 16, 8 Lactate 1.3 WBC 8 Hgb 11 INR 1 Blood  cultures pending   Assessment/Plan * LLL pneumonia- (present on admission) -Patient with known severe aspiration and acute R-sided CP today with worsening SOB and hypoxia to 80% -R chest pain was unlikely related to LLL PNA, but the PNA is almost certainly acute vs. Chronic aspiration PNA -Will monitor on telemetry for now, admit pending PEG tube placement and ongoing treatment -Will cover with Unasyn for now due to high suspicion for pneumonia; he was given Rocephin/Azithromycin in the ER -Pulm was consulted and wrote a chart note; they will notify Dr. Melvyn Novas of admission and if further pulm assistance is needed can re-consult  Dysphagia -Patient has had progressive dysphagia since radical neck dissection and XRT -It has gotten worse -He previously declined feeding tube -He is here with aspiration PNA - and he has known persistent aspiration with severe aspiration risk -After frank discussion, he is now willing to proceed with PEG tube -He would prefer to receive tube feeds and be NPO indefinitely rather than to proceed with hospice at this time -Will place IR consult for PEG tube placement -NPO for now -Palliative care consult  Hyponatremia- (present on admission) -Likely associated with malnutrition, chronic hypovolemia -Will rehydrate and follow -Asymptomatic, so unlikely acute -Nutrition consult once PEG tube is placed  COVID-19 virus infection- (present on admission) -Patient was positive for COVID-19 after my evaluation -While this could be incidental COVID in the setting of probable aspiration PNA, he is hypoxic and this could also be related to COVID -Will order steroids and Remdesivir (pharmacy consult) given +COVID test, and hypoxia <94% on room air -Encourage mobilization/ambulation as much as possible  DNR (do not resuscitate)- (present on admission) -I have discussed code status with the patient and he would not desire resuscitation and would prefer to die a natural  death should that situation arise. -He will need a gold out of facility DNR form at the time of discharge  Carotid disease, bilateral ([place])- (present on admission) -Continue Lipitor, ASA  Essential hypertension- (present on admission) -Continue Norvasc  Hypothyroidism- (present on admission) -Continue Synthroid  Tonsil cancer ([place])- (present on admission) -Stage IV in 2014 -Reportedly in remission    Advance Care  Planning:   Code Status: DNR   Consults: Pulmonology (telephone only); IR; Palliative care  Family Communication: None present and he did not request that I call his wife at the time of admission  Severity of Illness: The appropriate patient status for this patient is INPATIENT. Inpatient status is judged to be reasonable and necessary in order to provide the required intensity of service to ensure the patient's safety. The patient's presenting symptoms, physical exam findings, and initial radiographic and laboratory data in the context of their chronic comorbidities is felt to place them at high risk for further clinical deterioration. Furthermore, it is not anticipated that the patient will be medically stable for discharge from the hospital within 2 midnights of admission.   * I certify that at the point of admission it is my clinical judgment that the patient will require inpatient hospital care spanning beyond 2 midnights from the point of admission due to high intensity of service, high risk for further deterioration and high frequency of surveillance required.*  Author: Karmen Bongo 06/17/2021 7:25 PM  For on call review [email or web address].

## 2021-06-17 NOTE — Assessment & Plan Note (Signed)
-  I have discussed code status with the patient and he would not desire resuscitation and would prefer to die a natural death should that situation arise. -He will need a gold out of facility DNR form at the time of discharge

## 2021-06-17 NOTE — Assessment & Plan Note (Addendum)
-  Likely associated with malnutrition, chronic hypovolemia -Will rehydrate and follow -Asymptomatic, so unlikely acute -Nutrition consult once PEG tube is placed

## 2021-06-17 NOTE — Assessment & Plan Note (Signed)
-  Continue Lipitor, ASA

## 2021-06-17 NOTE — Assessment & Plan Note (Signed)
-  Patient was positive for COVID-19 after my evaluation -While this could be incidental COVID in the setting of probable aspiration PNA, he is hypoxic and this could also be related to COVID -Will order steroids and Remdesivir (pharmacy consult) given +COVID test, and hypoxia <94% on room air -Encourage mobilization/ambulation as much as possible

## 2021-06-17 NOTE — Assessment & Plan Note (Signed)
-  Stage IV in 2014 -Reportedly in remission

## 2021-06-17 NOTE — Progress Notes (Signed)
Brief Pulm Note  Discussed case with Dr. Lorin Mercy and reviewed Dr. Gustavus Bryant notes on this unfortunate patient.  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  Seems to now be agreeable to feeding tube.  This will not completely remove risk of aspiration but should prolong life.  I will let Dr. Melvyn Novas know and PCCM available if any questions or concerns.  Erskine Emery MD PCCM

## 2021-06-17 NOTE — Assessment & Plan Note (Signed)
-  Patient with known severe aspiration and acute R-sided CP today with worsening SOB and hypoxia to 80% -R chest pain was unlikely related to LLL PNA, but the PNA is almost certainly acute vs. Chronic aspiration PNA -Will monitor on telemetry for now, admit pending PEG tube placement and ongoing treatment -Will cover with Unasyn for now due to high suspicion for pneumonia; he was given Rocephin/Azithromycin in the ER -Pulm was consulted and wrote a chart note; they will notify Dr. Melvyn Novas of admission and if further pulm assistance is needed can re-consult

## 2021-06-18 ENCOUNTER — Encounter (HOSPITAL_COMMUNITY): Payer: Self-pay | Admitting: Internal Medicine

## 2021-06-18 DIAGNOSIS — U071 COVID-19: Secondary | ICD-10-CM | POA: Diagnosis not present

## 2021-06-18 DIAGNOSIS — R1314 Dysphagia, pharyngoesophageal phase: Secondary | ICD-10-CM | POA: Diagnosis not present

## 2021-06-18 DIAGNOSIS — J189 Pneumonia, unspecified organism: Secondary | ICD-10-CM

## 2021-06-18 DIAGNOSIS — J690 Pneumonitis due to inhalation of food and vomit: Secondary | ICD-10-CM | POA: Diagnosis not present

## 2021-06-18 LAB — COMPREHENSIVE METABOLIC PANEL
ALT: 17 U/L (ref 0–44)
AST: 28 U/L (ref 15–41)
Albumin: 2.3 g/dL — ABNORMAL LOW (ref 3.5–5.0)
Alkaline Phosphatase: 47 U/L (ref 38–126)
Anion gap: 9 (ref 5–15)
BUN: 8 mg/dL (ref 8–23)
CO2: 25 mmol/L (ref 22–32)
Calcium: 8 mg/dL — ABNORMAL LOW (ref 8.9–10.3)
Chloride: 93 mmol/L — ABNORMAL LOW (ref 98–111)
Creatinine, Ser: 0.56 mg/dL — ABNORMAL LOW (ref 0.61–1.24)
GFR, Estimated: >60 mL/min (ref >60)
Glucose, Bld: 69 mg/dL — ABNORMAL LOW (ref 70–99)
Potassium: 4.1 mmol/L (ref 3.5–5.1)
Sodium: 127 mmol/L — ABNORMAL LOW (ref 135–145)
Total Bilirubin: 0.5 mg/dL (ref 0.3–1.2)
Total Protein: 5.4 g/dL — ABNORMAL LOW (ref 6.5–8.1)

## 2021-06-18 LAB — CBC WITH DIFFERENTIAL/PLATELET
Abs Immature Granulocytes: 0.02 10*3/uL (ref 0.00–0.07)
Basophils Absolute: 0 10*3/uL (ref 0.0–0.1)
Basophils Relative: 0 %
Eosinophils Absolute: 0 10*3/uL (ref 0.0–0.5)
Eosinophils Relative: 0 %
HCT: 28.7 % — ABNORMAL LOW (ref 39.0–52.0)
Hemoglobin: 9.7 g/dL — ABNORMAL LOW (ref 13.0–17.0)
Immature Granulocytes: 0 %
Lymphocytes Relative: 4 %
Lymphs Abs: 0.2 10*3/uL — ABNORMAL LOW (ref 0.7–4.0)
MCH: 30.7 pg (ref 26.0–34.0)
MCHC: 33.8 g/dL (ref 30.0–36.0)
MCV: 90.8 fL (ref 80.0–100.0)
Monocytes Absolute: 0.1 10*3/uL (ref 0.1–1.0)
Monocytes Relative: 2 %
Neutro Abs: 5 10*3/uL (ref 1.7–7.7)
Neutrophils Relative %: 94 %
Platelets: 250 10*3/uL (ref 150–400)
RBC: 3.16 MIL/uL — ABNORMAL LOW (ref 4.22–5.81)
RDW: 14 % (ref 11.5–15.5)
WBC: 5.3 10*3/uL (ref 4.0–10.5)
nRBC: 0 % (ref 0.0–0.2)

## 2021-06-18 LAB — BASIC METABOLIC PANEL
Anion gap: 10 (ref 5–15)
BUN: 12 mg/dL (ref 8–23)
CO2: 25 mmol/L (ref 22–32)
Calcium: 8.1 mg/dL — ABNORMAL LOW (ref 8.9–10.3)
Chloride: 95 mmol/L — ABNORMAL LOW (ref 98–111)
Creatinine, Ser: 0.63 mg/dL (ref 0.61–1.24)
GFR, Estimated: >60 mL/min (ref >60)
Glucose, Bld: 80 mg/dL (ref 70–99)
Potassium: 3.9 mmol/L (ref 3.5–5.1)
Sodium: 130 mmol/L — ABNORMAL LOW (ref 135–145)

## 2021-06-18 LAB — D-DIMER, QUANTITATIVE: D-Dimer, Quant: 0.82 ug/mL-FEU — ABNORMAL HIGH (ref 0.00–0.50)

## 2021-06-18 LAB — C-REACTIVE PROTEIN: CRP: 11.8 mg/dL — ABNORMAL HIGH (ref <1.0)

## 2021-06-18 LAB — PHOSPHORUS: Phosphorus: 3.6 mg/dL (ref 2.5–4.6)

## 2021-06-18 LAB — FERRITIN: Ferritin: 339 ng/mL — ABNORMAL HIGH (ref 24–336)

## 2021-06-18 LAB — MAGNESIUM: Magnesium: 1.7 mg/dL (ref 1.7–2.4)

## 2021-06-18 MED ORDER — METHYLPREDNISOLONE SODIUM SUCC 125 MG IJ SOLR
60.0000 mg | Freq: Every day | INTRAMUSCULAR | Status: AC
  Administered 2021-06-18 – 2021-06-20 (×3): 60 mg via INTRAVENOUS
  Filled 2021-06-18 (×3): qty 2

## 2021-06-18 MED ORDER — PANTOPRAZOLE SODIUM 40 MG IV SOLR
40.0000 mg | INTRAVENOUS | Status: DC
  Administered 2021-06-18 – 2021-06-25 (×7): 40 mg via INTRAVENOUS
  Filled 2021-06-18 (×7): qty 40

## 2021-06-18 MED ORDER — PREDNISONE 5 MG PO TABS: 50.0000 mg | ORAL_TABLET | Freq: Every day | ORAL | Status: DC

## 2021-06-18 MED ORDER — MIDODRINE HCL 5 MG PO TABS
5.0000 mg | ORAL_TABLET | Freq: Three times a day (TID) | ORAL | Status: DC
  Administered 2021-06-18 – 2021-06-22 (×2): 5 mg via ORAL
  Filled 2021-06-18 (×3): qty 1

## 2021-06-18 MED ORDER — SODIUM CHLORIDE 0.9 % IV BOLUS
500.0000 mL | Freq: Once | INTRAVENOUS | Status: AC
  Administered 2021-06-18: 500 mL via INTRAVENOUS

## 2021-06-18 NOTE — Progress Notes (Signed)
IR aware of request for G-tube. Pt with hx of oropharyngeal cancer with worsening dysphagia and aspiration. Now admitted with PNA but also covid+.  IR reviewed prior imaging, CT chest from 2018 included upper abd, there appears to be safe percutaneous window per Dr. Denna Haggard.  Will formally assess tomorrow for candidacy.  In the interim, allow pt to improve clinically from acute/active respiratory illness. Has been placed on IV abx as well as Prednisone and Remdesivir.  Ascencion Dike PA-C Interventional Radiology 06/18/2021 1:04 PM

## 2021-06-18 NOTE — Progress Notes (Signed)
PROGRESS NOTE    Alexander Duran  NIO:[id number] DOB: 1945/12/17 DOA: 06/17/2021 PCP: Windy Fast, MD   Chief Complaint  Patient presents with   Shortness of Breath   Hypotension    Brief Narrative:   Alexander Duran is a 76 y.o. male with medical history significant of tonsillar cancer; carotid stenosis; dysphagia/achalasia;cricopharyngeal bar with chronic aspiration; HTN; hypothyroidism; and OSA presenting with SOB.  He has had swallowing difficulties, progressively worse, since the last 5-10 years.  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  He has a stent on the left carotid, placed in 2018.  He has been cancer free since he completed treatment.  He had an endoscopy by Dr. Loletha Carrow in 2018.  He has severe swallow dysfunction.  He saw Dr. Loletha Carrow last year and asked about esophageal dilation and he said it wasn't a good idea.  He saw a [place] (sees the doctor at the [place] for PCP).  He has lost weight due to lack of ability to take in enough nutrition.  He aspirates and that has gotten worse most recently.  The aspiration has caused a problem in his lungs.  He last saw Dr. Melvyn Novas on 11/10 and he said if he didn't get enough nutrition or have a feeding tube, he should probably transition to hospice.  He has not been recontacted by the [place] yet about this.  He has tried to remain as active as possible; he has lost so much weight his dentures don't fit anymore and the [place] won't help with that since he is not 100% service-connected disabled.  He had a terrible pain in his right lungs this AM when he got up.  He was SOB.  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  He is not feeling SOB now with the O2.  He was given morphine this AM for  the pain and that may have made him more SOB.  No fever.  The pain is better now.     ER Course:  PNA.  Has h/o orpharyngeal cancer and dysphagia, aspiration.  Presented with CP, cough, sputum.  WBC ok.  Lactate negative.   New O2 requirement, on 4L.  Isolated low BP, fine since.  Given IVF and abx.  +hyponatremia.  CXR with PNA.  Started on Rocephin/Azithro.   Assessment & Plan:   Principal Problem:   LLL pneumonia Active Problems:   Tonsil cancer ([place])   Hypothyroidism   Essential hypertension   Carotid disease, bilateral ([place])   Hyponatremia   Dysphagia   DNR (do not resuscitate)   COVID-19 virus infection   LLL pneumonia- (present on admission) -Patient with known severe aspiration and acute R-sided CP today with worsening SOB and hypoxia to 80% -R chest pain was unlikely related to LLL PNA, but the PNA is almost certainly acute vs. Chronic aspiration PNA -Return for aspiration pneumonia, as well possibility of COVID-19 for pneumonia especially patient is not vaccinated. -Continue with IV continue with IV Unasyn. -N.p.o., plan for PEG, SLP consulted -He was encouraged to use incentive spirometry, flutter valve.  Out of bed to chair -Continue with IV remdesivir, continue with IV  steroids as well given his hypoxia   Dysphagia -Patient has had progressive dysphagia since radical neck dissection and XRT -It has gotten worse -He previously declined feeding tube -He is here with aspiration PNA - and he has known persistent aspiration with severe aspiration risk -After frank discussion, he is now willing to proceed with PEG tube -He would prefer to receive tube feeds and be NPO indefinitely rather than to proceed with hospice at this time -Cussed with IR, they will evaluate tomorrow.   Hyponatremia- (present on admission) -Likely associated with malnutrition, chronic hypovolemia -Will rehydrate and follow -Asymptomatic, so unlikely acute -Improving, repeat 127, will recheck  another level this afternoon.  Continue to monitor closely on normal saline.   COVID-19 virus infection- (present on admission) -Patient was positive for COVID-19 after my evaluation -While this could be incidental COVID in the setting of probable aspiration PNA, he is hypoxic and this could also be related to COVID -Will order steroids and Remdesivir (pharmacy consult) given +COVID test, and hypoxia <94% on room air -Encourage mobilization/ambulation as much as possible -Unvaccinated.   DNR (do not resuscitate)- (present on admission) -I have discussed code status with the patient and he would not desire resuscitation and would prefer to die a natural death should that situation arise. -He will need a gold out of facility DNR form at the time of discharge   Carotid disease, bilateral ([place])- (present on admission) -Continue Lipitor, ASA   Essential hypertension- (present on admission) -Continue Norvasc   Hypothyroidism- (present on admission) -Continue Synthroid   Tonsil cancer ([place])- (present on admission) -Stage IV in 2014 -Reportedly in remission     DVT prophylaxis: Lovenox Code Status: DNR Family Communication: None at bedside Disposition:   Status is: Inpatient  Remains inpatient appropriate because: COVID-19 treatment, dysphagia and need of tube      Consultants:  IR  Subjective:  For generalized weakness, fatigue and cough  Objective: Vitals:   06/18/21 1300 06/18/21 1345 06/18/21 1420 06/18/21 1458  BP: 125/79 113/62 111/66 120/68  Pulse: 61 (!) 54 (!) 53 (!) 58  Resp: 14  14 16   Temp:   97.8 F (36.6 C) (!) 97.5 F (36.4 C)  TempSrc:   Oral Oral  SpO2: 100% 100% 99% 94%  Weight:    59.2 kg  Height:    5\' 6"  (1.676 m)    Intake/Output Summary (Last 24 hours) at 06/18/2021 1531 Last data filed at 06/18/2021 1222 Gross per 24 hour  Intake 200 ml  Output 300 ml  Net -100 ml   Filed Weights   06/17/21 1302 06/18/21 1458  Weight: 56.7 kg 59.2 kg     Examination:  Awake Alert, Oriented X 3, frail, chronically appearing, with neck deformity due to previous surgery/radiation Symmetrical Chest wall movement, Good air movement bilaterally, CTAB RRR,No Gallops,Rubs or new Murmurs, No Parasternal Heave +ve B.Sounds, Abd Soft, No tenderness, No rebound - guarding or rigidity. No Cyanosis, Clubbing or edema, No new Rash or bruise       Data Reviewed: I have personally reviewed following labs and imaging studies  CBC: Recent Labs  Lab 06/17/21 1139 06/18/21 0500  WBC 8.0 5.3  NEUTROABS 7.0 5.0  HGB 11.0* 9.7*  HCT 31.7* 28.7*  MCV 89.8 90.8  PLT 262 474    Basic Metabolic Panel: Recent Labs  Lab 06/17/21 1139 06/18/21 0500  NA 120* 127*  K 4.1 4.1  CL 83* 93*  CO2 28 25  GLUCOSE 85 69*  BUN 9 8  CREATININE 0.59* 0.56*  CALCIUM 8.2* 8.0*  MG  --  1.7  PHOS  --  3.6    GFR: Estimated Creatinine Clearance: 68.9 mL/min (A) (by C-G formula based on SCr of 0.56 mg/dL (L)).  Liver Function Tests: Recent Labs  Lab 06/17/21 1139 06/18/21 0500  AST 38 28  ALT 21 17  ALKPHOS 54 47  BILITOT 0.6 0.5  PROT 6.4* 5.4*  ALBUMIN 2.8* 2.3*    CBG: No results for input(s): GLUCAP in the last 168 hours.   Recent Results (from the past 240 hour(s))  Blood Culture (routine x 2)     Status: None (Preliminary result)   Collection Time: 06/17/21 11:39 AM   Specimen: BLOOD  Result Value Ref Range Status   Specimen Description BLOOD SITE NOT SPECIFIED  Final   Special Requests   Final    BOTTLES DRAWN AEROBIC AND ANAEROBIC Blood Culture adequate volume   Culture   Final    NO GROWTH < 24 HOURS Performed at [facility], [address] [place], [place] [address]    Report Status PENDING  Incomplete  Blood Culture (routine x 2)     Status: None (Preliminary result)   Collection Time: 06/17/21 11:44 AM   Specimen: BLOOD  Result Value Ref Range Status   Specimen Description BLOOD SITE NOT SPECIFIED  Final    Special Requests   Final    BOTTLES DRAWN AEROBIC AND ANAEROBIC Blood Culture adequate volume   Culture   Final    NO GROWTH < 24 HOURS Performed at [facility], [place] [address], [place], [place] [address]    Report Status PENDING  Incomplete  Resp Panel by RT-PCR (Flu A&B, Covid) Nasopharyngeal Swab     Status: Abnormal   Collection Time: 06/17/21  3:12 PM   Specimen: Nasopharyngeal Swab; Nasopharyngeal(NP) swabs in vial transport medium  Result Value Ref Range Status   SARS Coronavirus 2 by RT PCR POSITIVE (A) NEGATIVE Final    Comment: (NOTE) SARS-CoV-2 target nucleic acids are DETECTED.  The SARS-CoV-2 RNA is generally detectable in upper respiratory specimens during the acute phase of infection. Positive results are indicative of the presence of the identified virus, but do not rule out bacterial infection or co-infection with other pathogens not detected by the test. Clinical correlation with patient history and other diagnostic information is necessary to determine patient infection status. The expected result is Negative.  Fact Sheet for Patients: EntrepreneurPulse.com.au  Fact Sheet for Healthcare Providers: IncredibleEmployment.be  This test is not yet approved or cleared by the [place] FDA and  has been authorized for detection and/or diagnosis of SARS-CoV-2 by FDA under an Emergency Use Authorization (EUA).  This EUA will remain in effect (meaning this test can be used) for the duration of  the COVID-19 declaration under Section 564(b)(1) of the A ct, 21 U.S.C. section 360bbb-3(b)(1), unless the authorization is terminated or revoked sooner.     Influenza A by PCR NEGATIVE NEGATIVE Final   Influenza B by PCR NEGATIVE NEGATIVE Final    Comment: (NOTE) The Xpert Xpress SARS-CoV-2/FLU/RSV plus assay is intended as an aid in the diagnosis of influenza from Nasopharyngeal swab specimens and should not be used as a sole  basis for treatment. Nasal washings and aspirates are unacceptable for Xpert Xpress SARS-CoV-2/FLU/RSV testing.  Fact Sheet for Patients: EntrepreneurPulse.com.au  Fact Sheet for Healthcare Providers: IncredibleEmployment.be  This test is not yet approved or cleared by  the [employer] and has been authorized for detection and/or diagnosis of SARS-CoV-2 by FDA under an Emergency Use Authorization (EUA). This EUA will remain in effect (meaning this test can be used) for the duration of the COVID-19 declaration under Section 564(b)(1) of the Act, 21 U.S.C. section 360bbb-3(b)(1), unless the authorization is terminated or revoked.  Performed at [facility], [place] [address], [place], [place] [address]          Radiology Studies: DG Chest [place] 1 View  Result Date: 06/17/2021 CLINICAL DATA:  76 year old male with history of sepsis. EXAM: PORTABLE CHEST 1 VIEW COMPARISON:  Chest x-ray 04/26/2021. FINDINGS: Consolidation in the medial aspect of the left liver lobe. Diffuse peribronchial cuffing scattered throughout the lungs bilaterally. No pleural effusions. No pneumothorax. No evidence of pulmonary edema. Heart size is normal. Upper mediastinal contours are within normal limits. Atherosclerotic calcifications. IMPRESSION: 1. Bronchitis with left lower lobe pneumonia. 2. Aortic atherosclerosis. Electronically Signed   By: Vinnie Langton M.D.   On: 06/17/2021 12:08        Scheduled Meds:  albuterol  2 puff Inhalation Q6H   aspirin  81 mg Oral QPM   atorvastatin  20 mg Oral Daily   docusate sodium  100 mg Oral BID   enoxaparin (LOVENOX) injection  40 mg Subcutaneous Q24H   levothyroxine  75 mcg Oral QAC breakfast   methylPREDNISolone (SOLU-MEDROL) injection  60 mg Intravenous Daily   Followed by   Derrill Memo ON 06/21/2021] predniSONE  50 mg Oral Daily   midodrine  5 mg Oral TID WC   sodium chloride flush  3 mL Intravenous Q12H    Continuous Infusions:  sodium chloride 100 mL/hr at 06/18/21 1024   ampicillin-sulbactam (UNASYN) IV Stopped (06/18/21 0953)   remdesivir 100 mg in NS 100 mL Stopped (06/18/21 1222)     LOS: 1 day      Phillips Climes, MD [facility]   To contact the attending provider between 7A-7P or the covering provider during after hours 7P-7A, please log into the web site [email or web address] and access using universal [place] password for that web site. If you do not have the password, please call the hospital operator.  06/18/2021, 3:31 PM   Patient ID: Alexander Duran, male   DOB: [birth year], 76 y.o.   MRN: [id number]

## 2021-06-18 NOTE — Progress Notes (Signed)
Patient arrived to unit from ED and appears calm and free of pain. Patient assessed. Patient complains of inability to swallow. MD aware. Patient bed linen changed and CHG bath given; patient changed into gown. Patient oriented to unit. Will continue to monitor

## 2021-06-18 NOTE — ED Notes (Signed)
MD notified of Pt HR brady and pressures somewhat soft

## 2021-06-18 NOTE — Plan of Care (Signed)
  Problem: Activity: Goal: Ability to tolerate increased activity will improve Outcome: Progressing   Problem: Clinical Measurements: Goal: Ability to maintain a body temperature in the normal range will improve Outcome: Progressing   Problem: Respiratory: Goal: Ability to maintain adequate ventilation will improve Outcome: Progressing Goal: Ability to maintain a clear airway will improve Outcome: Progressing   

## 2021-06-18 NOTE — ED Notes (Signed)
Pt reporting cough and having 10+ pain - PRN meds to be administered - Per MD Howerter since pt is NPO hold on cough medicine at this time

## 2021-06-18 NOTE — Progress Notes (Addendum)
TRH night cross cover note:  I was notified by RN of patient's most recent blood pressure 101/63, improved relative to presenting BP of 71/54, following interval IV fluid bolus followed by continuous NS at 75 cc/h in the context of presenting suspected dehydration.  Heart rate in the range of 56-68.  I have discontinued the patient's Norvasc.  Could consider additional IV fluid bolus if the patient subsequently develops recurrence of hypotension.   Update: most recent BP slightly lower than prior, now 99/59 relative to most recent prior of 101/63. Will order 500 cc NS bolus to be administered over 2 hours.     Babs Bertin, DO Hospitalist

## 2021-06-19 DIAGNOSIS — J690 Pneumonitis due to inhalation of food and vomit: Secondary | ICD-10-CM | POA: Diagnosis not present

## 2021-06-19 DIAGNOSIS — E46 Unspecified protein-calorie malnutrition: Secondary | ICD-10-CM

## 2021-06-19 DIAGNOSIS — R638 Other symptoms and signs concerning food and fluid intake: Secondary | ICD-10-CM

## 2021-06-19 DIAGNOSIS — Z515 Encounter for palliative care: Secondary | ICD-10-CM

## 2021-06-19 DIAGNOSIS — R634 Abnormal weight loss: Secondary | ICD-10-CM

## 2021-06-19 DIAGNOSIS — Z7189 Other specified counseling: Secondary | ICD-10-CM | POA: Diagnosis not present

## 2021-06-19 DIAGNOSIS — I6523 Occlusion and stenosis of bilateral carotid arteries: Secondary | ICD-10-CM | POA: Diagnosis not present

## 2021-06-19 DIAGNOSIS — U071 COVID-19: Secondary | ICD-10-CM | POA: Diagnosis not present

## 2021-06-19 LAB — CBC WITH DIFFERENTIAL/PLATELET
Abs Immature Granulocytes: 0 10*3/uL (ref 0.00–0.07)
Basophils Absolute: 0 10*3/uL (ref 0.0–0.1)
Basophils Relative: 0 %
Eosinophils Absolute: 0 10*3/uL (ref 0.0–0.5)
Eosinophils Relative: 0 %
HCT: 28.4 % — ABNORMAL LOW (ref 39.0–52.0)
Hemoglobin: 9.8 g/dL — ABNORMAL LOW (ref 13.0–17.0)
Lymphocytes Relative: 2 %
Lymphs Abs: 0.1 10*3/uL — ABNORMAL LOW (ref 0.7–4.0)
MCH: 31.2 pg (ref 26.0–34.0)
MCHC: 34.5 g/dL (ref 30.0–36.0)
MCV: 90.4 fL (ref 80.0–100.0)
Monocytes Absolute: 0.5 10*3/uL (ref 0.1–1.0)
Monocytes Relative: 8 %
Neutro Abs: 5.3 10*3/uL (ref 1.7–7.7)
Neutrophils Relative %: 90 %
Platelets: 263 10*3/uL (ref 150–400)
RBC: 3.14 MIL/uL — ABNORMAL LOW (ref 4.22–5.81)
RDW: 14.3 % (ref 11.5–15.5)
WBC: 5.9 10*3/uL (ref 4.0–10.5)
nRBC: 0 % (ref 0.0–0.2)

## 2021-06-19 LAB — COMPREHENSIVE METABOLIC PANEL
ALT: 15 U/L (ref 0–44)
AST: 23 U/L (ref 15–41)
Albumin: 2.1 g/dL — ABNORMAL LOW (ref 3.5–5.0)
Alkaline Phosphatase: 41 U/L (ref 38–126)
Anion gap: 9 (ref 5–15)
BUN: 13 mg/dL (ref 8–23)
CO2: 25 mmol/L (ref 22–32)
Calcium: 7.9 mg/dL — ABNORMAL LOW (ref 8.9–10.3)
Chloride: 98 mmol/L (ref 98–111)
Creatinine, Ser: 0.57 mg/dL — ABNORMAL LOW (ref 0.61–1.24)
GFR, Estimated: >60 mL/min (ref >60)
Glucose, Bld: 87 mg/dL (ref 70–99)
Potassium: 3.8 mmol/L (ref 3.5–5.1)
Sodium: 132 mmol/L — ABNORMAL LOW (ref 135–145)
Total Bilirubin: 0.9 mg/dL (ref 0.3–1.2)
Total Protein: 5.1 g/dL — ABNORMAL LOW (ref 6.5–8.1)

## 2021-06-19 LAB — PHOSPHORUS: Phosphorus: 3.5 mg/dL (ref 2.5–4.6)

## 2021-06-19 LAB — C-REACTIVE PROTEIN: CRP: 8.7 mg/dL — ABNORMAL HIGH (ref <1.0)

## 2021-06-19 LAB — FERRITIN: Ferritin: 337 ng/mL — ABNORMAL HIGH (ref 24–336)

## 2021-06-19 LAB — MAGNESIUM: Magnesium: 1.9 mg/dL (ref 1.7–2.4)

## 2021-06-19 LAB — D-DIMER, QUANTITATIVE: D-Dimer, Quant: 0.85 ug/mL-FEU — ABNORMAL HIGH (ref 0.00–0.50)

## 2021-06-19 NOTE — Consult Note (Signed)
Palliative Care Consult Note                                  Date: 06/19/2021   Patient Name: Alexander Duran  DOB: December 17, 1945  MRN: [id number]  Age / Sex: 76 y.o., male  PCP: Windy Fast, MD Referring Physician: Albertine Patricia, MD  Reason for Consultation: Establishing goals of care  HPI/Patient Profile: 76 y.o. male  with past medical history of  tonsillar cancer (pt reports remission since 2014), carotid stenosis, dysphagia/achalasia, cricopharyngeal bar with chronic aspiration, HTN, hypothyroidism, and OSA.  His SCC of the tonsil is status post radical neck dissection and 7 weeks of radiation status post right carotid artery blockage from radiation damage.  Noted posttreatment swallowing dysfunction which has been ongoing for last 5 to 10 years.  Esophageal dilation not an option.  Noted progressive weight loss, weakness related to poor nutrition due to severe aspiration over the past year.  Dentures no longer fitting due to weight loss.   He presented with complaints of shortness of breath and "lung pain" which improved somewhat on oxygen and morphine.  He was admitted on 06/17/2021 with left lower lobe pneumonia, likely acute versus chronic aspiration pneumonia.  However, he is also COVID-19 positive and this could be contributing.  Also with severe dysphagia.  PMT was consulted for goals of care conversation.  Past Medical History:  Diagnosis Date   Arthritis    Cancer ([place])    Tonsil cancer   Carotid artery stenosis without cerebral infarction 08/30/2015   Difficulty in swallowing    History of radiation therapy    Hypertension    Hypothyroidism 05/09/2014   Other headache syndrome 08/10/2015   Sleep apnea    Weight loss 08/08/2015    Subjective:   This NP Walden Field reviewed medical records, received report from team, assessed the patient and then meet at the patient's bedside to discuss diagnosis, prognosis, GOC, EOL  wishes disposition and options.  I met with the patient Alexander Duran at the bedside.   Concept of Palliative Care was introduced as specialized medical care for people and their families living with serious illness.  If focuses on providing relief from the symptoms and stress of a serious illness.  The goal is to improve quality of life for both the patient and the family. Values and goals of care important to patient and family were attempted to be elicited.  Created space and opportunity for patient  and family to explore thoughts and feelings regarding current medical situation   Natural trajectory and current clinical status were discussed. Questions and concerns addressed. Patient  encouraged to call with questions or concerns.    Patient/Family Understanding of Illness: He confirms that he has a history of tonsil cancer that is in remission since treatment and subsequent dysphagia.  He notes that it is be gotten progressively worse over the past year to where he cannot eat many foods.  He is having difficulty maintaining his weight because of this and admits his dentures do not fit which also compounds the situation.  He knows he has pneumonia and COVID-19.  We spent some time discussing his chronic health issues, which are actually quite minimal given his age.  We also discussed the acute situations including dysphagia, aspiration pneumonia, acute/ongoing weight loss, worsening aspiration.  Answered any questions he had about his chronic health issues.  Patient Values: Quality of  life  Goals: To try to get stronger with improved nutrition to be able to get home and get on with living his life  Today's Discussion: In addition to the extensive discussions related to his acute issues we discussed/confirmed that he would like to remain a DNR.  I discussed that in his current situation feeding tube is likely a potential option that could help with his nutrition, although it may not prolong  his life.  We discussed his progressive dysphagia as a side effect of his cancer treatment and his aspiration pneumonia as an acute exacerbation of likely chronic aspiration pneumonia.  Without this, hospice services would be beneficial.  He confirms that he would like to get stronger and get home and that his goal has always been a quality of life so he is agreeable to proceed with feeding tube.  We then spent some time discussing the realities of a feeding tube, typically how it is placed, education prior to discharge, and options for feeding/nutrition through the feeding tube.  He seems to understand this and does not have many questions.  I feel he is adequately informed about "what this will look like" with a feeding tube.  I provided emotional and general support through therapeutic listening, empathy, and other techniques.  I answered all questions and addressed all concerns to the best of my ability.  Review of Systems  Constitutional:  Positive for appetite change, fatigue and unexpected weight change.  Respiratory:  Positive for cough and shortness of breath (improved). Negative for chest tightness.        "Left lung pain"  Gastrointestinal:  Negative for abdominal pain, nausea and vomiting.   Objective:   Primary Diagnoses: Present on Admission:  LLL pneumonia  Essential hypertension  Hyponatremia  Hypothyroidism  Tonsil cancer ([place])  Carotid disease, bilateral ([place])  DNR (do not resuscitate)  COVID-19 virus infection   Physical Exam Vitals and nursing note reviewed.  Constitutional:      General: He is sleeping.     Appearance: He is underweight.  HENT:     Head: Normocephalic and atraumatic.  Cardiovascular:     Rate and Rhythm: Normal rate and regular rhythm.  Pulmonary:     Effort: Pulmonary effort is normal. No respiratory distress.  Abdominal:     General: Abdomen is flat.     Palpations: Abdomen is soft.  Skin:    General: Skin is warm and dry.   Neurological:     General: No focal deficit present.     Mental Status: He is easily aroused.  Psychiatric:        Mood and Affect: Mood normal.        Behavior: Behavior normal.    Vital Signs:  BP 120/65 (BP Location: Right Arm)    Pulse (!) 54    Temp (!) 97.4 F (36.3 C) (Axillary)    Resp 18    Ht '5\' 6"'  (1.676 m)    Wt 59.2 kg    SpO2 98%    BMI 21.07 kg/m   Palliative Assessment/Data: 40-50%    Advanced Care Planning:   Primary Decision Maker: PATIENT  Code Status/Advance Care Planning: DNR  A discussion was had today regarding advanced directives. Concepts specific to code status, artifical feeding and hydration, continued IV antibiotics and rehospitalization was had.  The difference between a aggressive medical intervention path and a palliative comfort care path for this patient at this time was had.  Decisions/Changes to ACP: No changes today  Assessment & Plan:   Impression: 75 year old male who does not have an undue burden some amount of chronic illnesses.  He was admitted with shortness of breath likely acute on chronic aspiration pneumonia +/- COVID-19 pneumonia.  He is currently being treated for these with antibiotics, remdesivir.  Noted dysphagia since throat/neck radiation due to tonsillar cancer.  This is gotten significantly worse over the past year.  He admits that he coughs frequently during eating likely suggesting aspiration as well.  He is unable to maintain his weight with poor nutrition due to this.  It seems the majority of his issues are all tied back to the aspiration and poor nutrition.  He is agreeable to a feeding tube and in this situation, without a high chronic disease burden, I feel this could provide him with improved quality of life which is his ultimate goal.  SUMMARY OF RECOMMENDATIONS   Confirmed DNR Confirmed patient desire for feeding tube placement for improved nutrition PMT will back away for now Please call us if we can be of  further assistance or if other needs are identified  Symptom Management:  Per primary team PMT is available to assist as needed  Prognosis:  Unable to determine  Discharge Planning:  To Be Determined   Discussed with: Medical team, nursing team, patient  I discussed the management plan and steps taken with the primary medical team  Thank you for allowing [place] to participate in the care of Sheralyn Boatman PMT will continue to support holistically.  Signed by: Walden Field, NP Palliative Medicine Team  Team Phone # [phone] (Nights/Weekends)  06/19/2021, 2:47 PM

## 2021-06-19 NOTE — Progress Notes (Signed)
°  Transition of Care [facility]) Screening Note   Patient Details  Name: Alexander Duran Date of Birth: October 04, 1945   Transition of Care [facility]) CM/SW Contact:    Benard Halsted, LCSW Phone Number: 06/19/2021, 8:43 AM    Transition of Care Department [facility]) has reviewed patient and no TOC needs have been identified at this time. We will continue to monitor patient advancement through interdisciplinary progression rounds. If new patient transition needs arise, please place a TOC consult.

## 2021-06-19 NOTE — Plan of Care (Signed)
°  Problem: Activity: Goal: Ability to tolerate increased activity will improve Outcome: Progressing   Problem: Clinical Measurements: Goal: Ability to maintain a body temperature in the normal range will improve Outcome: Progressing   Problem: Respiratory: Goal: Ability to maintain adequate ventilation will improve Outcome: Progressing Goal: Ability to maintain a clear airway will improve Outcome: Progressing   Problem: Education: Goal: Knowledge of General Education information will improve Description: Including pain rating scale, medication(s)/side effects and non-pharmacologic comfort measures Outcome: Progressing   Problem: Clinical Measurements: Goal: Ability to maintain clinical measurements within normal limits will improve Outcome: Progressing Goal: Will remain free from infection Outcome: Progressing Goal: Diagnostic test results will improve Outcome: Progressing Goal: Respiratory complications will improve Outcome: Progressing Goal: Cardiovascular complication will be avoided Outcome: Progressing

## 2021-06-19 NOTE — Progress Notes (Signed)
PROGRESS NOTE    Alexander Duran  KDT:[id number] DOB: 1946-03-28 DOA: 06/17/2021 PCP: Windy Fast, MD   Chief Complaint  Patient presents with   Shortness of Breath   Hypotension    Brief Narrative:   Alexander Duran is a 76 y.o. male with medical history significant of tonsillar cancer; carotid stenosis; dysphagia/achalasia;cricopharyngeal bar with chronic aspiration; HTN; hypothyroidism; and OSA presenting with SOB.  He has had swallowing difficulties, progressively worse, since the last 5-10 years.  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  He has a stent on the left carotid, placed in 2018.  He has been cancer free since he completed treatment.  He had an endoscopy by Dr. Loletha Carrow in 2018.  He has severe swallow dysfunction.  He saw Dr. Loletha Carrow last year and asked about esophageal dilation and he said it wasn't a good idea.  He saw a [place] (sees the doctor at the [place] for PCP).  He has lost weight due to lack of ability to take in enough nutrition.  He aspirates and that has gotten worse most recently.  The aspiration has caused a problem in his lungs.  He last saw Dr. Melvyn Novas on 11/10 and he said if he didn't get enough nutrition or have a feeding tube, he should probably transition to hospice.  He has not been recontacted by the [place] yet about this.  He has tried to remain as active as possible; he has lost so much weight his dentures don't fit anymore and the [facility] won't help with that since he is not 100% service-connected disabled.  He had a terrible pain in his right lungs this AM when he got up.  He was SOB.  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  He is not feeling SOB now with the O2.  He was given morphine this AM for  the pain and that may have made him more SOB.  No fever.  The pain is better now. -Patient was admitted with aspiration pneumonia, he is with known severe dysphagia, as well he is COVID-19 positive, he is being treated for aspiration pneumonia, and being evaluated by IR for PEG tube placement, and he is on remdesivir for COVID-19 infection.    Assessment & Plan:   Principal Problem:   LLL pneumonia Active Problems:   Tonsil cancer ([place])   Hypothyroidism   Essential hypertension   Carotid disease, bilateral ([place])   Hyponatremia   Dysphagia   DNR (do not resuscitate)   COVID-19 virus infection   LLL pneumonia -Patient with known severe aspiration and acute R-sided CP today with worsening SOB and hypoxia to 80% -R chest pain was unlikely related to LLL PNA, but the PNA is almost certainly acute vs. Chronic aspiration PNA -Return for aspiration pneumonia, as well possibility of COVID-19 for pneumonia especially patient is not vaccinated. -Continue with IV continue with IV Unasyn. -N.p.o., plan for PEG, SLP consulted -He was encouraged to use incentive spirometry, flutter valve.  Out of bed to chair -Continue with IV remdesivir, continue with IV steroids as well given his hypoxia   Dysphagia -Patient has had progressive dysphagia since radical  neck dissection and XRT -It has gotten worse -He previously declined feeding tube -He is here with aspiration PNA - and he has known persistent aspiration with severe aspiration risk -After frank discussion, he is now willing to proceed with PEG tube -He would prefer to receive tube feeds and be NPO indefinitely rather than to proceed with hospice at this time -I have discussed with SLP, patient is very high risk for aspiration, and would rather have alternative means of nutrition. -We will request cortrak to be inserted, and for core track feeding while being worked up by [employer] for PEG tube.    Hyponatremia- (present on admission) -Likely  associated with malnutrition, chronic hypovolemia -Will rehydrate and follow -Asymptomatic, so unlikely acute -Improving, repeat 127, will recheck another level this afternoon.  Continue to monitor closely on normal saline.   COVID-19 virus infection- (present on admission) -Patient was positive for COVID-19 after my evaluation -While this could be incidental COVID in the setting of probable aspiration PNA, he is hypoxic and this could also be related to COVID -Will order steroids and Remdesivir (pharmacy consult) given +COVID test, and hypoxia <94% on room air -Encourage mobilization/ambulation as much as possible -Unvaccinated.   DNR (do not resuscitate)- (present on admission) -I have discussed code status with the patient and he would not desire resuscitation and would prefer to die a natural death should that situation arise. -He will need a gold out of facility DNR form at the time of discharge   Carotid disease, bilateral ([place])- (present on admission) -Continue Lipitor, ASA   Essential hypertension- (present on admission) -Continue Norvasc   Hypothyroidism- (present on admission) -Continue Synthroid   Tonsil cancer ([place])- (present on admission) -Stage IV in 2014 -Reportedly in remission     DVT prophylaxis: Lovenox Code Status: DNR Family Communication: Discussed with wife by phone Disposition:   Status is: Inpatient  Remains inpatient appropriate because: COVID-19 treatment, dysphagia and need of tube      Consultants:  IR  Subjective:  For generalized weakness, fatigue and cough  Objective: Vitals:   06/18/21 2300 06/19/21 0400 06/19/21 0841 06/19/21 1214  BP: 134/82 114/75 125/78 120/65  Pulse: (!) 56 63 (!) 54 (!) 54  Resp: 16 18    Temp: 97.6 F (36.4 C) 97.6 F (36.4 C) 97.6 F (36.4 C) (!) 97.4 F (36.3 C)  TempSrc: Oral Axillary Temporal Axillary  SpO2: 92% 93% 98%   Weight:      Height:        Intake/Output Summary (Last 24 hours) at  06/19/2021 1445 Last data filed at 06/19/2021 0645 Gross per 24 hour  Intake 1242.36 ml  Output 450 ml  Net 792.36 ml   Filed Weights   06/17/21 1302 06/18/21 1458  Weight: 56.7 kg 59.2 kg    Examination:  Awake Alert, frail, thin appearing, deconditioned, extremely hard of hearing Symmetrical Chest wall movement, Good air movement bilaterally, CTAB RRR,No Gallops,Rubs or new Murmurs, No Parasternal Heave +ve B.Sounds, Abd Soft, No tenderness, No rebound - guarding or rigidity. No Cyanosis, Clubbing or edema, No new Rash or bruise        Data Reviewed: I have personally reviewed following labs and imaging studies  CBC: Recent Labs  Lab 06/17/21 1139 06/18/21 0500 06/19/21 0108  WBC 8.0 5.3 5.9  NEUTROABS 7.0 5.0 5.3  HGB 11.0* 9.7* 9.8*  HCT 31.7* 28.7* 28.4*  MCV 89.8 90.8 90.4  PLT 262 250 784    Basic Metabolic Panel: Recent Labs  Lab 06/17/21 1139 06/18/21 0500 06/18/21 1558 06/19/21 0108  NA 120* 127* 130* 132*  K 4.1 4.1 3.9 3.8  CL 83* 93* 95* 98  CO2 28 25 25 25   GLUCOSE 85 69* 80 87  BUN 9 8 12 13   CREATININE 0.59* 0.56* 0.63 0.57*  CALCIUM 8.2* 8.0* 8.1* 7.9*  MG  --  1.7  --  1.9  PHOS  --  3.6  --  3.5    GFR: Estimated Creatinine Clearance: 68.9 mL/min (A) (by C-G formula based on SCr of 0.57 mg/dL (L)).  Liver Function Tests: Recent Labs  Lab 06/17/21 1139 06/18/21 0500 06/19/21 0108  AST 38 28 23  ALT 21 17 15   ALKPHOS 54 47 41  BILITOT 0.6 0.5 0.9  PROT 6.4* 5.4* 5.1*  ALBUMIN 2.8* 2.3* 2.1*    CBG: No results for input(s): GLUCAP in the last 168 hours.   Recent Results (from the past 240 hour(s))  Blood Culture (routine x 2)     Status: None (Preliminary result)   Collection Time: 06/17/21 11:39 AM   Specimen: BLOOD  Result Value Ref Range Status   Specimen Description BLOOD SITE NOT SPECIFIED  Final   Special Requests   Final    BOTTLES DRAWN AEROBIC AND ANAEROBIC Blood Culture adequate volume   Culture   Final     NO GROWTH 2 DAYS Performed at [facility], [address], [place], [place] [address]    Report Status PENDING  Incomplete  Blood Culture (routine x 2)     Status: None (Preliminary result)   Collection Time: 06/17/21 11:44 AM   Specimen: BLOOD  Result Value Ref Range Status   Specimen Description BLOOD SITE NOT SPECIFIED  Final   Special Requests   Final    BOTTLES DRAWN AEROBIC AND ANAEROBIC Blood Culture adequate volume   Culture   Final    NO GROWTH 2 DAYS Performed at [facility], [address], [place], [place] [address]    Report Status PENDING  Incomplete  Resp Panel by RT-PCR (Flu A&B, Covid) Nasopharyngeal Swab     Status: Abnormal   Collection Time: 06/17/21  3:12 PM   Specimen: Nasopharyngeal Swab; Nasopharyngeal(NP) swabs in vial transport medium  Result Value Ref Range Status   SARS Coronavirus 2 by RT PCR POSITIVE (A) NEGATIVE Final    Comment: (NOTE) SARS-CoV-2 target nucleic acids are DETECTED.  The SARS-CoV-2 RNA is generally detectable in upper respiratory specimens during the acute phase of infection. Positive results are indicative of the presence of the identified virus, but do not rule out bacterial infection or co-infection with other pathogens not detected by the test. Clinical correlation with patient history and other diagnostic information is necessary to determine patient infection status. The expected result is Negative.  Fact Sheet for Patients: EntrepreneurPulse.com.au  Fact Sheet for Healthcare Providers: IncredibleEmployment.be  This test is not yet approved or cleared by the [place] FDA and  has been authorized for detection and/or diagnosis of SARS-CoV-2 by FDA under an Emergency Use Authorization (EUA).  This EUA will remain in effect (meaning this test can be used) for the duration of  the COVID-19 declaration under Section 564(b)(1) of the A ct, 21 U.S.C. section 360bbb-3(b)(1),  unless the authorization is terminated or revoked sooner.     Influenza A by PCR NEGATIVE NEGATIVE Final   Influenza B by PCR NEGATIVE NEGATIVE Final    Comment: (NOTE) The Xpert Xpress SARS-CoV-2/FLU/RSV plus  assay is intended as an aid in the diagnosis of influenza from Nasopharyngeal swab specimens and should not be used as a sole basis for treatment. Nasal washings and aspirates are unacceptable for Xpert Xpress SARS-CoV-2/FLU/RSV testing.  Fact Sheet for Patients: EntrepreneurPulse.com.au  Fact Sheet for Healthcare Providers: IncredibleEmployment.be  This test is not yet approved or cleared by the [place] FDA and has been authorized for detection and/or diagnosis of SARS-CoV-2 by FDA under an Emergency Use Authorization (EUA). This EUA will remain in effect (meaning this test can be used) for the duration of the COVID-19 declaration under Section 564(b)(1) of the Act, 21 U.S.C. section 360bbb-3(b)(1), unless the authorization is terminated or revoked.  Performed at [facility], [place] [address] [place], [place] [address]          Radiology Studies: No results found.      Scheduled Meds:  albuterol  2 puff Inhalation Q6H   aspirin  81 mg Oral QPM   atorvastatin  20 mg Oral Daily   docusate sodium  100 mg Oral BID   enoxaparin (LOVENOX) injection  40 mg Subcutaneous Q24H   levothyroxine  75 mcg Oral QAC breakfast   methylPREDNISolone (SOLU-MEDROL) injection  60 mg Intravenous Daily   Followed by   [facility] Memo ON 06/21/2021] predniSONE  50 mg Oral Daily   midodrine  5 mg Oral TID WC   pantoprazole (PROTONIX) IV  40 mg Intravenous Q24H   sodium chloride flush  3 mL Intravenous Q12H   Continuous Infusions:  sodium chloride 100 mL/hr at 06/19/21 0853   ampicillin-sulbactam (UNASYN) IV 3 g (06/19/21 0954)   remdesivir 100 mg in NS 100 mL 100 mg (06/19/21 0837)     LOS: 2 days      Phillips Climes, MD [facility]   To contact the attending provider between 7A-7P or the covering provider during after hours 7P-7A, please log into the web site [email or web address] and access using universal [place] password for that web site. If you do not have the password, please call the hospital operator.  06/19/2021, 2:45 PM   Patient ID: KIJUAN GALLICCHIO, male   DOB: 08-31-1945, 76 y.o.   MRN: [id number] Patient ID: AVREY HYSER, male   DOB: 05/22/1946, 76 y.o.   MRN: [id number]

## 2021-06-19 NOTE — Evaluation (Signed)
Clinical/Bedside Swallow Evaluation Patient Details  Name: Alexander Duran MRN: [id number] Date of Birth: 11/28/1945  Today's Date: 06/19/2021 Time: SLP Start Time (ACUTE ONLY): 0932 SLP Stop Time (ACUTE ONLY): 3557 SLP Time Calculation (min) (ACUTE ONLY): 15 min  Past Medical History:  Past Medical History:  Diagnosis Date   Arthritis    Cancer ([place])    Tonsil cancer   Carotid artery stenosis without cerebral infarction 08/30/2015   Difficulty in swallowing    History of radiation therapy    Hypertension    Hypothyroidism 05/09/2014   Other headache syndrome 08/10/2015   Sleep apnea    Weight loss 08/08/2015   Past Surgical History:  Past Surgical History:  Procedure Laterality Date   BACK SURGERY     CAROTID PTA/STENT INTERVENTION Left 08/02/2016   Procedure: Carotid PTA/Stent Intervention;  Surgeon: Elam Dutch, MD;  Location: [facility];  Service: Cardiovascular;  Laterality: Left;   ESOPHAGOGASTRODUODENOSCOPY N/A 11/15/2016   Procedure: ESOPHAGOGASTRODUODENOSCOPY (EGD);  Surgeon: Doran Stabler, MD;  Location[facility];  Service: Endoscopy;  Laterality: N/A;   FOREIGN BODY REMOVAL N/A 11/15/2016   Procedure: FOREIGN BODY REMOVAL;  Surgeon: Doran Stabler, MD;  Location: [facility];  Service: Endoscopy;  Laterality: N/A;   PERIPHERAL VASCULAR CATHETERIZATION N/A 07/04/2016   Procedure: Aortic Arch Angiography;  Surgeon: Conrad Rockford, MD;  Location: [facility];  Service: Cardiovascular;  Laterality: N/A;   PERIPHERAL VASCULAR CATHETERIZATION N/A 07/04/2016   Procedure: Carotid & cerebral  Angiography;  Surgeon: Conrad Poneto, MD;  Location: [facility];  Service: Cardiovascular;  Laterality: N/A;   TOTAL HIP ARTHROPLASTY Left 10/26/2019   Procedure: TOTAL HIP ARTHROPLASTY ANTERIOR APPROACH;  Surgeon: Paralee Cancel, MD;  Location: [facility];  Service: Orthopedics;  Laterality: Left;  70 mins   TUMOR REMOVAL     in neck   HPI:  Alexander Duran  is a 76 y.o. male with medical history significant of tonsillar cancer; carotid stenosis; dysphagia/achalasia;cricopharyngeal bar with chronic aspiration; HTN; hypothyroidism; and OSA presenting with SOB.  He has had swallowing difficulties, progressively worse, since the last 5-10 years.  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  He has been cancer free since he completed treatment.  He had an endoscopy by Dr. Loletha Carrow in 2018.  He has severe swallow dysfunction.  He saw Dr. Loletha Carrow last year and asked about esophageal dilation and he said it wasn't a good idea.  He saw a [place] (sees the doctor at the [place] for PCP).  He has lost weight due to lack of ability to take in enough nutrition.  He aspirates and that has gotten worse most recently.  The aspiration has caused a problem in his lungs.  He last saw Dr. Melvyn Novas on 11/10 and he said if he didn't get enough nutrition or have a feeding tube, he should probably transition to hospice.  He has not been recontacted by the [place] yet about this.  He has tried to remain as active as possible; he has lost so much weight his dentures don't fit anymore and the [place] [facility] won't help with that since he is not 100% service-connected disabled.  He presented to ED after having terrible pain in his right lungs when he got up.  He was SOB.  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  Pt found to be COVID+.  Pt with hx of "severe pharyngo-cervical esophageal dysphagia from iatrogenic impacts of tonsillar cancer treatment" with most recent MBS November 2022 revealing silent aspiration of thin and nectar thick liquids and recommendations to consult with MD to discussion nutrition options.    Assessment / Plan / Recommendation  Clinical Impression  Pt presents with severe risk of aspiration in setting of hx of chronic  dysphagia 2/2 H&N radiation for tonsilar cancer in 2003.  Pt has LLL pna this admission, and was found to be COVID+.  Pt has known hx of severe, chronic, iatrogenic pharyngoesophageal dysphagia with silent aspiration observed on most recent MBSS 05/09/2021. Pt has been eating "whatever he can" at home, and endorses swallowing difficulty.  He does not elaborate on diet texture/consistency when asked, but states that it is more difficult to swallow food than liquids.  Today pt requried 3 swallows with sip of thin liquid and there wet coughing which followed.  With puree there was immediate, prolonged coughing. Pt stated that applesauce did not go down.  Pt continued with cough for 5+ minutes after single bite of puree with some expectoration and endorsed stasis of applesauce.  Pt reasonably declined trials of regular solid cracker.  PEG placement is planned, but timing is uncertain.    Recommend pt remain NPO with alternate means of nutrition, hydration, and medication.    Pt's swallow function has likely not improved since most recent MBSS; however, if additional objective swallow study is desired, please place orders for MBSS.  Pt would likely not benefit from swallowing therapy to improve pharyngeal swallow function given severity of deficits, time post onset, PLOF, and dysphagia etiology; additionally pt has significant esophageal deficits impacting swallow function.  SLP will sign off at this time. Consider OP speech therapy for dysphagia management, if indicated.  SLP Visit Diagnosis: Dysphagia, pharyngoesophageal phase (R13.14)    Aspiration Risk  Severe aspiration risk;Risk for inadequate nutrition/hydration    Diet Recommendation NPO   Medication Administration: Via alternative means    Other  Recommendations      Recommendations for follow up therapy are one component of a multi-disciplinary discharge planning process, led by the attending physician.  Recommendations may be updated based on  patient status, additional functional criteria and insurance authorization.  Follow up Recommendations Outpatient SLP      Assistance Recommended at Discharge None  Functional Status Assessment Patient has not had a recent decline in their functional status  Frequency and Duration   N/A         Prognosis Prognosis for Safe Diet Advancement: Guarded Barriers to Reach Goals: Time post onset;Severity of deficits      Swallow Study   General Date of Onset: 06/17/21 HPI: Alexander Duran is a 76 y.o. male with medical history significant of tonsillar cancer; carotid stenosis; dysphagia/achalasia;cricopharyngeal bar with chronic aspiration; HTN; hypothyroidism; and OSA presenting with SOB.  He has had swallowing difficulties, progressively worse, since the last 5-10 years.  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  He has been cancer free since he completed treatment.  He had an endoscopy by Dr. Loletha Carrow in 2018.  He has severe swallow dysfunction.  He saw Dr. Loletha Carrow last year and asked about esophageal dilation and he said it wasn't a good idea.  He saw a [place] (sees the doctor at the [place] for PCP).  He has lost weight due to lack of ability to take in enough nutrition.  He aspirates and that has gotten worse most recently.  The aspiration has caused a problem in his lungs.  He last saw Dr. Melvyn Novas on 11/10 and he said if he didn't get enough nutrition or have a feeding tube, he should probably transition to hospice.  He has not been recontacted by the [place] yet about this.  He has tried to remain as active as possible; he has lost so much weight his dentures don't fit anymore and the [facility] won't help with that since he is not 100% service-connected disabled.  He presented to ED after having terrible pain in his right lungs when he got up.  He was SOB.  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  Pt found to be COVID+.  Pt with hx of "severe pharyngo-cervical esophageal dysphagia from iatrogenic impacts of tonsillar cancer treatment" with most recent MBS November 2022 revealing silent aspiration of thin and nectar thick liquids and recommendations to consult with MD to discussion nutrition options. Type of Study: Bedside Swallow Evaluation Previous Swallow Assessment: MBSS 05/09/21 Diet Prior to this Study: NPO Temperature Spikes Noted: No Respiratory Status: Nasal cannula History of Recent Intubation: No Behavior/Cognition: Alert;Cooperative;Pleasant mood Oral Cavity Assessment: Within Functional Limits Oral Care Completed by SLP: No Oral Cavity - Dentition: Edentulous Self-Feeding Abilities: Able to feed self Patient Positioning: Partially reclined Baseline Vocal Quality: Hoarse Volitional Cough: Strong Volitional Swallow: Able to elicit    Oral/Motor/Sensory Function Overall Oral Motor/Sensory Function: Moderate impairment Facial ROM: Reduced right Facial Symmetry: Abnormal symmetry right Lingual ROM: Reduced right;Reduced left Lingual Symmetry: Abnormal symmetry right Lingual Strength: Reduced Velum: Within Functional Limits Mandible: Within Functional Limits   Ice Chips Ice chips: Not tested   Thin Liquid Thin Liquid: Impaired Pharyngeal  Phase Impairments: Decreased hyoid-laryngeal movement;Multiple swallows;Cough - Immediate;Cough - Delayed    Nectar Thick Nectar Thick Liquid: Not tested   Honey Thick Honey Thick Liquid: Not tested   Puree Puree: Impaired Presentation: Spoon Pharyngeal Phase Impairments: Decreased hyoid-laryngeal movement;Multiple swallows;Wet Vocal Quality;Throat Clearing - Immediate;Throat Clearing - Delayed;Cough - Immediate;Cough - Delayed   Solid     Solid: Not tested      Alexander Duran, [place], [place] Office: [phone]  06/19/2021,10:18 AM

## 2021-06-19 NOTE — Plan of Care (Signed)
  Problem: Activity: Goal: Ability to tolerate increased activity will improve Outcome: Progressing   Problem: Clinical Measurements: Goal: Ability to maintain a body temperature in the normal range will improve Outcome: Progressing   Problem: Respiratory: Goal: Ability to maintain adequate ventilation will improve Outcome: Progressing Goal: Ability to maintain a clear airway will improve Outcome: Progressing   

## 2021-06-20 ENCOUNTER — Inpatient Hospital Stay (HOSPITAL_COMMUNITY): Payer: Medicare Other

## 2021-06-20 DIAGNOSIS — J690 Pneumonitis due to inhalation of food and vomit: Secondary | ICD-10-CM | POA: Diagnosis not present

## 2021-06-20 DIAGNOSIS — U071 COVID-19: Secondary | ICD-10-CM | POA: Diagnosis not present

## 2021-06-20 DIAGNOSIS — I10 Essential (primary) hypertension: Secondary | ICD-10-CM | POA: Diagnosis not present

## 2021-06-20 DIAGNOSIS — R1314 Dysphagia, pharyngoesophageal phase: Secondary | ICD-10-CM | POA: Diagnosis not present

## 2021-06-20 LAB — COMPREHENSIVE METABOLIC PANEL
ALT: 15 U/L (ref 0–44)
AST: 22 U/L (ref 15–41)
Albumin: 2.2 g/dL — ABNORMAL LOW (ref 3.5–5.0)
Alkaline Phosphatase: 43 U/L (ref 38–126)
Anion gap: 7 (ref 5–15)
BUN: 17 mg/dL (ref 8–23)
CO2: 27 mmol/L (ref 22–32)
Calcium: 8.1 mg/dL — ABNORMAL LOW (ref 8.9–10.3)
Chloride: 103 mmol/L (ref 98–111)
Creatinine, Ser: 0.67 mg/dL (ref 0.61–1.24)
GFR, Estimated: >60 mL/min (ref >60)
Glucose, Bld: 87 mg/dL (ref 70–99)
Potassium: 3.7 mmol/L (ref 3.5–5.1)
Sodium: 137 mmol/L (ref 135–145)
Total Bilirubin: 0.8 mg/dL (ref 0.3–1.2)
Total Protein: 5.4 g/dL — ABNORMAL LOW (ref 6.5–8.1)

## 2021-06-20 LAB — URINALYSIS, ROUTINE W REFLEX MICROSCOPIC
Bilirubin Urine: NEGATIVE
Glucose, UA: NEGATIVE mg/dL
Hgb urine dipstick: NEGATIVE
Ketones, ur: 20 mg/dL — AB
Leukocytes,Ua: NEGATIVE
Nitrite: NEGATIVE
Protein, ur: NEGATIVE mg/dL
Specific Gravity, Urine: 1.03 (ref 1.005–1.030)
pH: 5 (ref 5.0–8.0)

## 2021-06-20 LAB — CBC WITH DIFFERENTIAL/PLATELET
Abs Immature Granulocytes: 0.1 10*3/uL — ABNORMAL HIGH (ref 0.00–0.07)
Basophils Absolute: 0 10*3/uL (ref 0.0–0.1)
Basophils Relative: 0 %
Eosinophils Absolute: 0 10*3/uL (ref 0.0–0.5)
Eosinophils Relative: 0 %
HCT: 31.4 % — ABNORMAL LOW (ref 39.0–52.0)
Hemoglobin: 10.4 g/dL — ABNORMAL LOW (ref 13.0–17.0)
Immature Granulocytes: 2 %
Lymphocytes Relative: 5 %
Lymphs Abs: 0.4 10*3/uL — ABNORMAL LOW (ref 0.7–4.0)
MCH: 30.4 pg (ref 26.0–34.0)
MCHC: 33.1 g/dL (ref 30.0–36.0)
MCV: 91.8 fL (ref 80.0–100.0)
Monocytes Absolute: 0.4 10*3/uL (ref 0.1–1.0)
Monocytes Relative: 7 %
Neutro Abs: 5.9 10*3/uL (ref 1.7–7.7)
Neutrophils Relative %: 86 %
Platelets: 250 10*3/uL (ref 150–400)
RBC: 3.42 MIL/uL — ABNORMAL LOW (ref 4.22–5.81)
RDW: 14.5 % (ref 11.5–15.5)
WBC: 6.8 10*3/uL (ref 4.0–10.5)
nRBC: 0 % (ref 0.0–0.2)

## 2021-06-20 LAB — C-REACTIVE PROTEIN: CRP: 5.5 mg/dL — ABNORMAL HIGH (ref <1.0)

## 2021-06-20 LAB — FERRITIN: Ferritin: 370 ng/mL — ABNORMAL HIGH (ref 24–336)

## 2021-06-20 LAB — MAGNESIUM: Magnesium: 2.1 mg/dL (ref 1.7–2.4)

## 2021-06-20 LAB — D-DIMER, QUANTITATIVE: D-Dimer, Quant: 0.8 ug/mL-FEU — ABNORMAL HIGH (ref 0.00–0.50)

## 2021-06-20 LAB — PHOSPHORUS: Phosphorus: 2.8 mg/dL (ref 2.5–4.6)

## 2021-06-20 IMAGING — CT CT ABDOMEN W/O CM
2 of 4 series · 15 of 46 positions shown, 17 images · non-contrast
Comparison: Prior renal ultrasound [DATE]; CT scan of the chest
[DATE]

CLINICAL DATA: Dysphagia.  Evaluate for gastrostomy tube placement.

EXAM:
CT ABDOMEN WITHOUT CONTRAST
TECHNIQUE: Multidetector CT imaging of the abdomen was performed following the
standard protocol without IV contrast.

[Series 3: a/p w/o 5mm · axial · non-contrast · 0.89mm/px · z∈[+368,+578]mm · 12 of 48 slices shown, 14 images]
[im 3/48  soft-tissue]
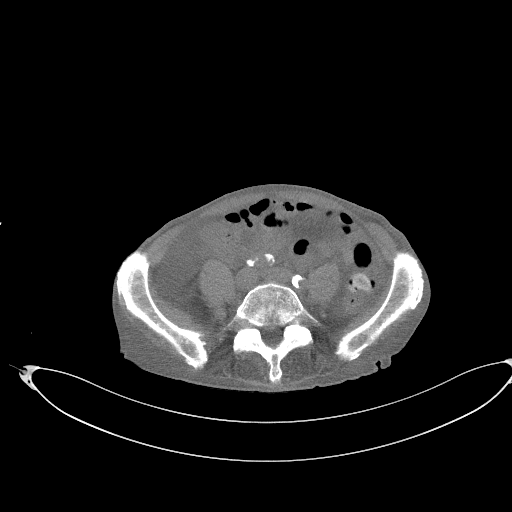
[im 3/48  bone]
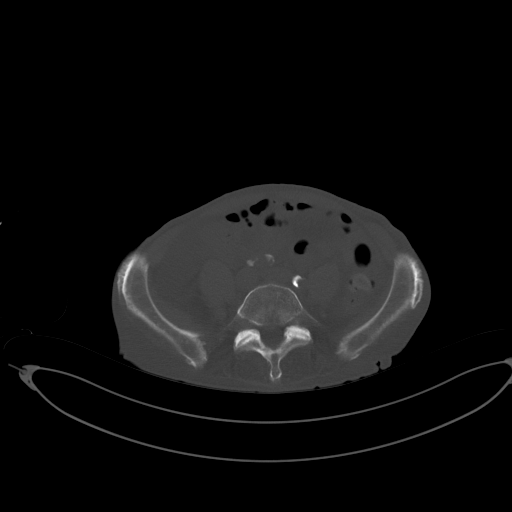
[im 7/48  soft-tissue]
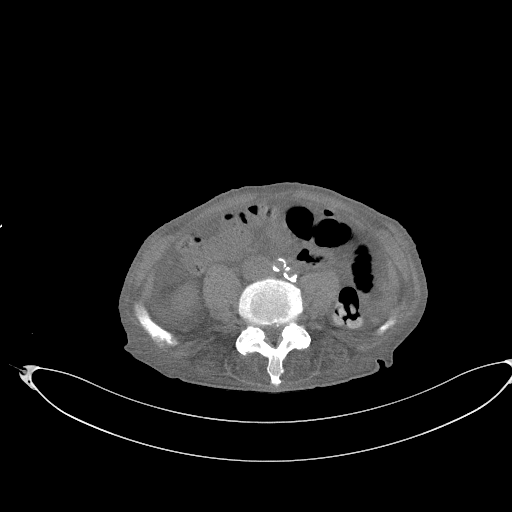
[im 11/48  soft-tissue]
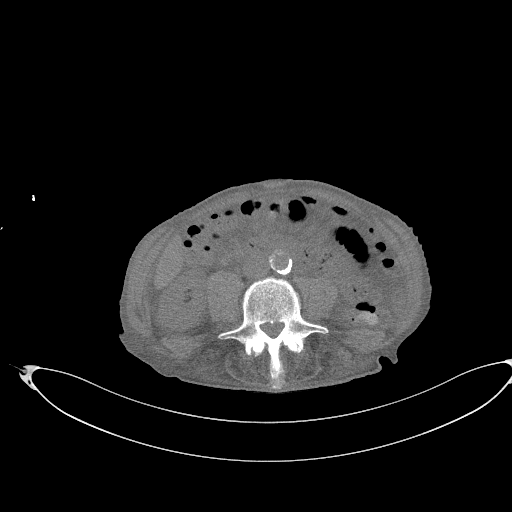
[im 15/48  soft-tissue]
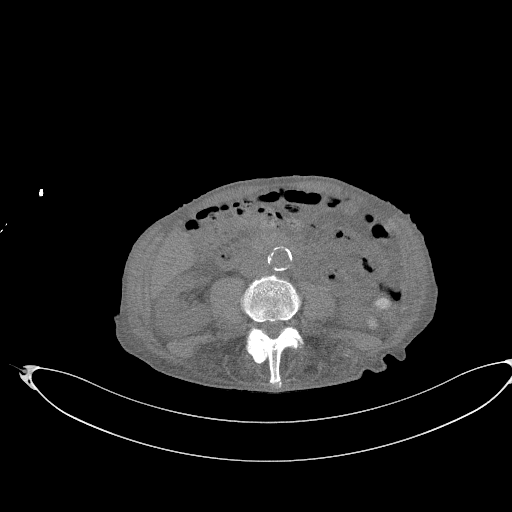
[im 19/48  soft-tissue]
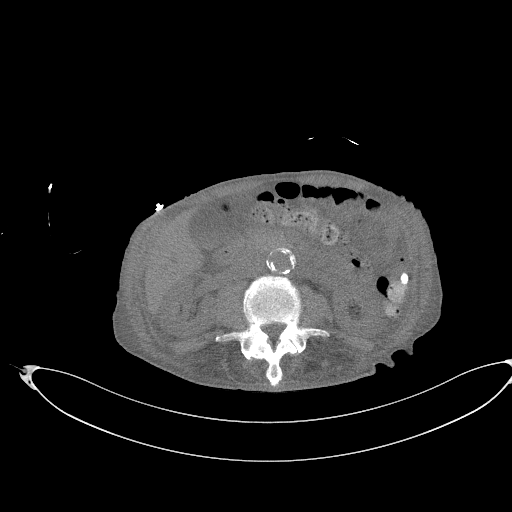
[im 23/48  soft-tissue]
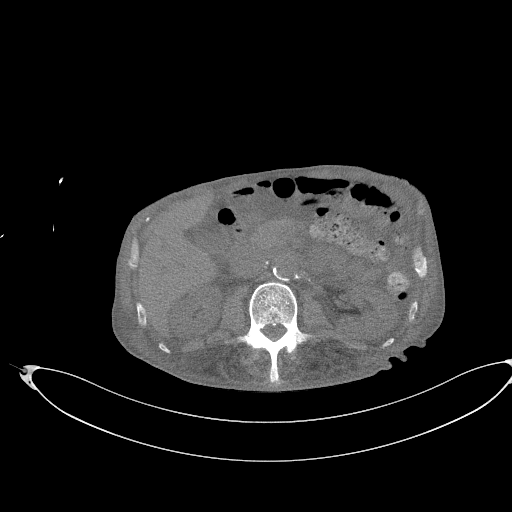
[im 25/48  soft-tissue]
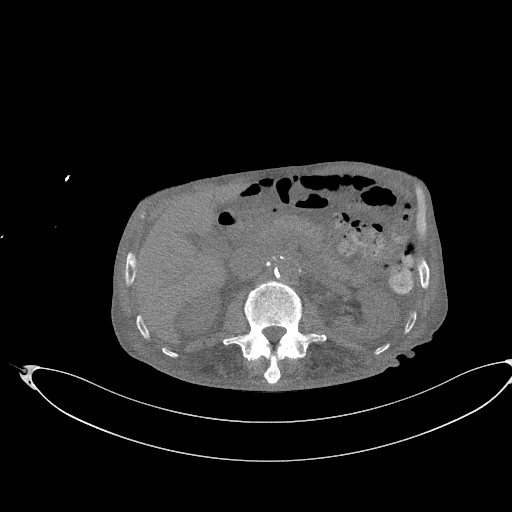
[im 29/48  soft-tissue]
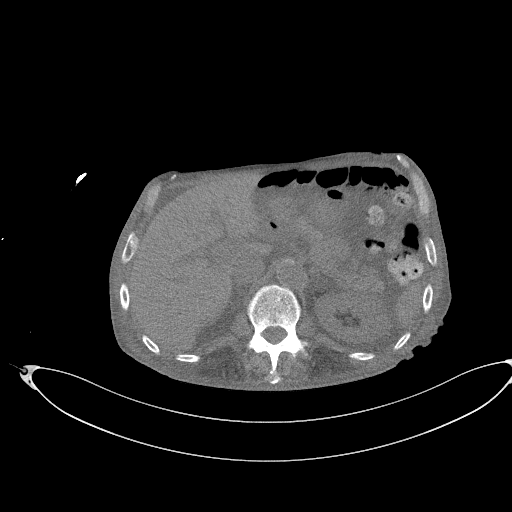
[im 33/48  soft-tissue]
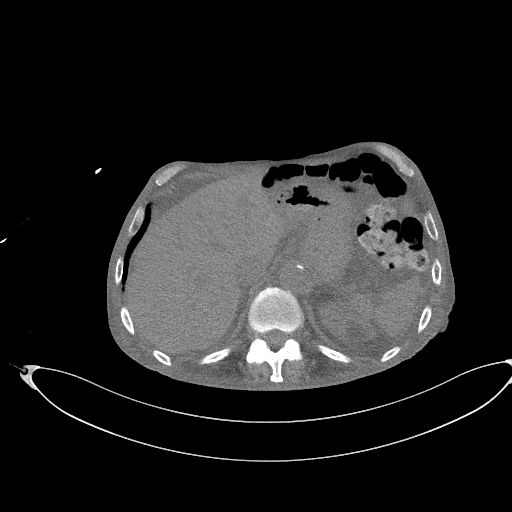
[im 33/48  bone]
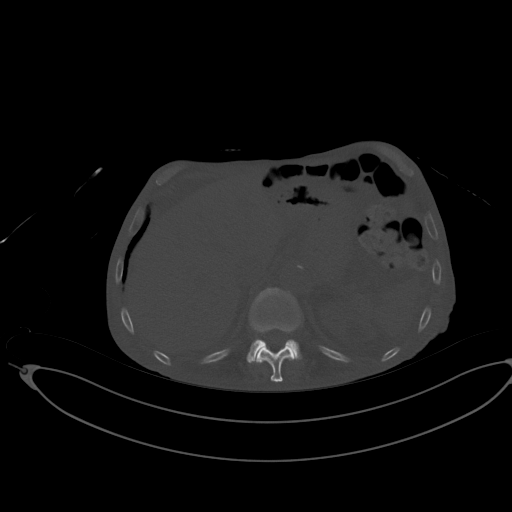
[im 37/48  soft-tissue]
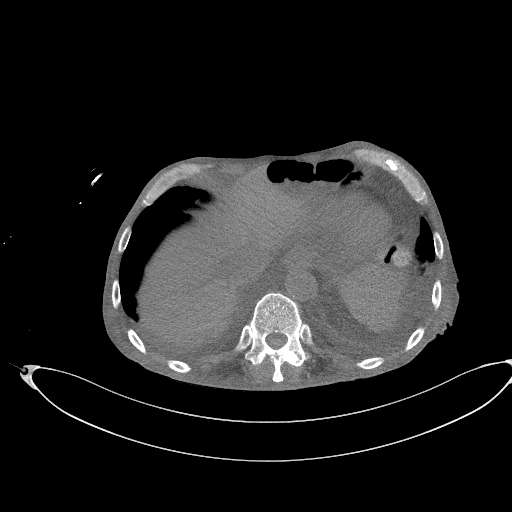
[im 41/48  soft-tissue]
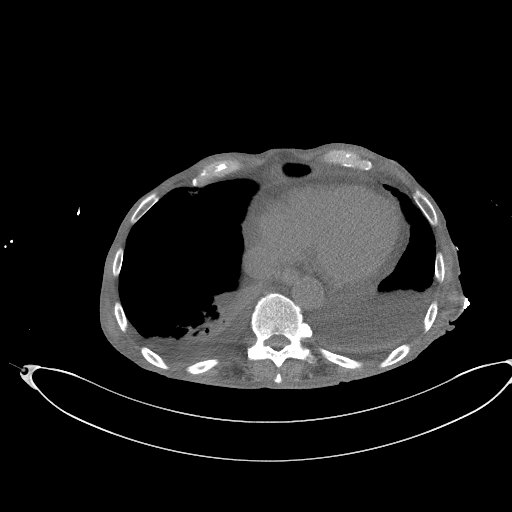
[im 45/48  soft-tissue]
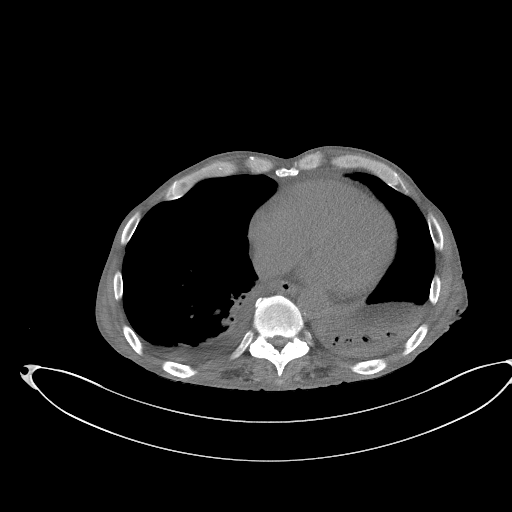

[Series 6: a/p w/o cor · coronal · non-contrast · 0.47mm/px · 3 of 147 slices shown]
[im 49/147  soft-tissue]
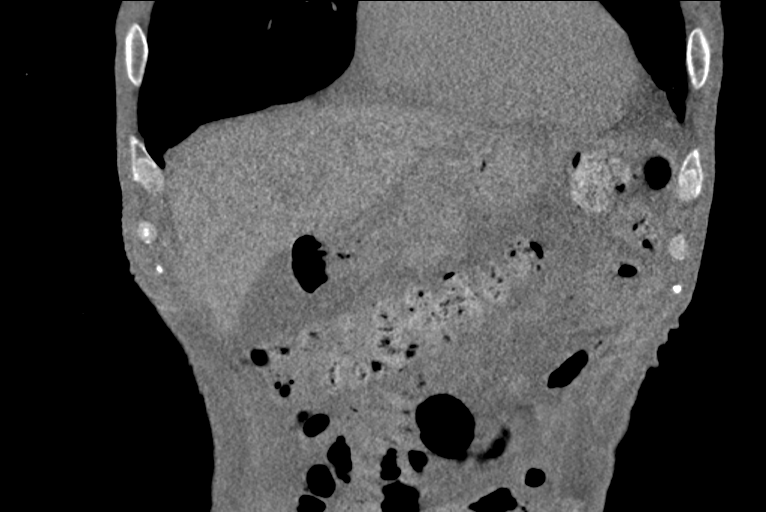
[im 65/147  soft-tissue]
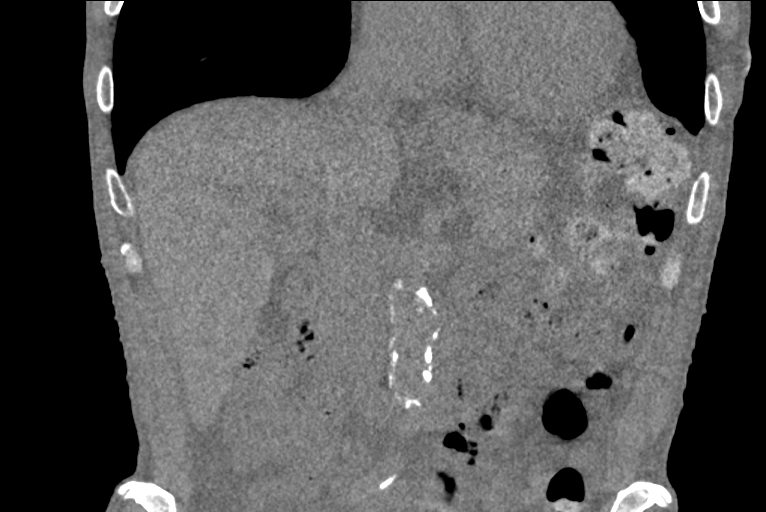
[im 82/147  soft-tissue]
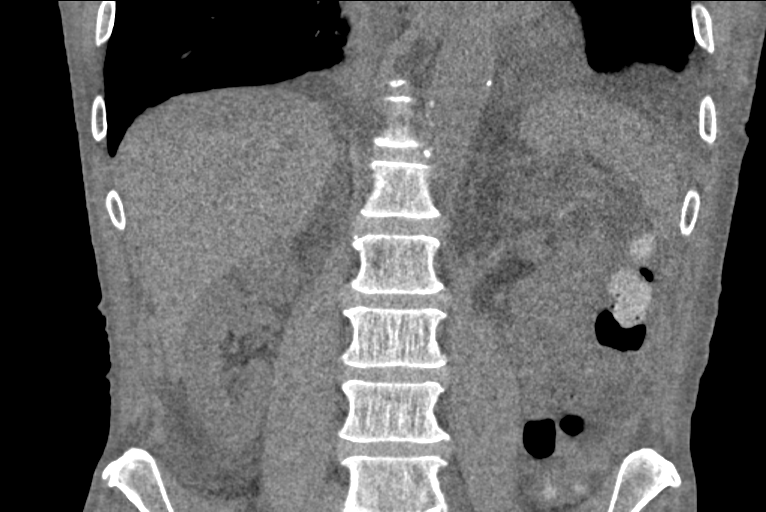

[15 of 46 positions shown; findings below may reference images not displayed]

FINDINGS: Lower chest: Trace right pleural effusion with associated
atelectasis. Small left sub pulmonic effusion with associated dense
atelectasis versus infiltrate in the left lower lobe. Additional
patchy foci of ground-glass attenuation airspace opacity are present
in the inferior aspect of the middle lobe.

Hepatobiliary: Normal hepatic contour morphology. No discrete
hepatic lesion. High attenuation material layers within the
gallbladder consistent with sludge and/or small stones. No biliary
ductal dilatation.

Pancreas: Unremarkable. No pancreatic ductal dilatation or
surrounding inflammatory changes.

Spleen: Normal in size without focal abnormality.

Adrenals/Urinary Tract: Diffuse anasarca and intra-abdominal edema
results in obscuration of the adrenal glands. No gross abnormality.
No hydronephrosis. Grossly stable left upper pole renal cyst
compared to prior imaging.

Stomach/Bowel: Relatively high position of the stomach with
significant colonic interposition. The transverse colon rises to the
level of the xiphoid process anterior to the stomach.

Vascular/Lymphatic: Limited evaluation in the absence of intravenous
contrast. Extensive atherosclerotic calcifications throughout the
aorta. No significant lymphadenopathy.

Other: Diffuse body wall edema and mesenteric edema. Additionally,
there is small volume ascites.

Musculoskeletal: No acute fracture or aggressive appearing lytic or
blastic osseous lesion.
IMPRESSION: 1. Significant colonic interposition. The mid aspect of the
transverse colon extends to the level of the xiphoid process
completely obscuring the gastric antrum. Anatomically, this patient
is NOT a candidate for percutaneous gastrostomy tube placement.
2. Anasarca, mesenteric edema, small volume ascites and small (left
larger than right) pleural effusions consistent with third spacing
versus volume overload.
3. Dense consolidation and volume loss in the left lower lobe
consistent with atelectasis.
4. Patchy areas of ground-glass attenuation airspace opacity in the
inferior right middle lobe may represent pneumonia or aspiration.
5.  Aortic Atherosclerosis ([UN]-[UN]).
6. Sludge and/or small stones in the gallbladder.

## 2021-06-20 NOTE — Consult Note (Signed)
[facility] Surgery Consult Note  Alexander Duran 09/26/1945  [id number].    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 He also tested positive for covid-19. Patient has had progressive dysphagia since treatment for his tonsillar cancer. He is reportedly in remission. He has lost weight due to lack of ability to take in enough nutrition, he reports at least 50lbs in the last year.  Patient initially declined PEG tube, but is now willing to consider. IR unable to place due to his anatomy. General surgery asked to see for consideration of surgical G tube placement.  Abdominal surgical history: none Anticoagulants: ASA 81mg  Former smoker, quit in 2019  ROS  All systems reviewed and otherwise negative except for as above.  SOB and pleurisy improved since admission.  Family History  Problem Relation Age of Onset   Cancer Father        lung cancer   COPD Mother    Macular degeneration Mother    Colon cancer Neg Hx    Stomach cancer Neg Hx    Pancreatic cancer Neg Hx     Past Medical History:  Diagnosis Date   Arthritis    Cancer ([place])    Tonsil cancer   Carotid artery stenosis without cerebral infarction 08/30/2015   Difficulty in swallowing    History of radiation therapy    Hypertension    Hypothyroidism 05/09/2014   Other headache syndrome 08/10/2015   Sleep apnea    Weight loss 08/08/2015    Past Surgical History:  Procedure Laterality Date   BACK SURGERY     CAROTID PTA/STENT INTERVENTION Left 08/02/2016   Procedure: Carotid PTA/Stent Intervention;  Surgeon: Elam Dutch, MD;  Location: [facility];  Service: Cardiovascular;  Laterality: Left;   ESOPHAGOGASTRODUODENOSCOPY N/A 11/15/2016    Procedure: ESOPHAGOGASTRODUODENOSCOPY (EGD);  Surgeon: Doran Stabler, MD;  Location: [facility]OS[facility];  Service: Endoscopy;  Laterality: N/A;   FOREIGN BODY REMOVAL N/A 11/15/2016   Procedure: FOREIGN BODY REMOVAL;  Surgeon: Doran Stabler, MD;  Location: [facility];  Service: Endoscopy;  Laterality: N/A;   PERIPHERAL VASCULAR CATHETERIZATION N/A 07/04/2016   Procedure: Aortic Arch Angiography;  Surgeon: Conrad Ostrander, MD;  Location: [facility];  Service: Cardiovascular;  Laterality: N/A;   PERIPHERAL VASCULAR CATHETERIZATION N/A 07/04/2016   Procedure: Carotid & cerebral  Angiography;  Surgeon: Conrad Crenshaw, MD;  Location: [facility];  Service: Cardiovascular;  Laterality: N/A;   TOTAL HIP ARTHROPLASTY Left 10/26/2019   Procedure: TOTAL HIP ARTHROPLASTY ANTERIOR APPROACH;  Surgeon: Paralee Cancel, MD;  Location: [facility];  Service: Orthopedics;  Laterality: Left;  70 mins   TUMOR REMOVAL     in neck    Social History:  reports that he quit smoking about 4 years ago. His smoking use included cigarettes. He has a 35.00 pack-year smoking history. He has never used smokeless tobacco. He reports that he does not currently use alcohol after a past usage of about 2.0 standard drinks per week. He reports that he does not currently use drugs after having used the following drugs: Marijuana.  Allergies:  Allergies  Allergen Reactions   Iodinated Contrast Media     Other reaction(s): Weakness present  Medications Prior to Admission  Medication Sig Dispense Refill   albuterol (VENTOLIN HFA) 108 (90 Base) MCG/ACT inhaler Inhale 2 puffs into the lungs every 6 (six) hours as needed for wheezing or shortness of breath.     amLODipine (NORVASC) 5 MG tablet Take 5 mg by mouth daily.     aspirin 81 MG chewable tablet Chew 81 mg by mouth every evening.      atorvastatin (LIPITOR) 20 MG tablet Take 20 mg by mouth daily.     levothyroxine (SYNTHROID, LEVOTHROID) 75 MCG tablet Take 75 mcg by  mouth daily before breakfast.      OVER THE COUNTER MEDICATION TheraTears Liquid Eye Drops- Place 1-2 drops into both eyes one to three times as day as needed for dryness or irritation      Prior to Admission medications   Medication Sig Start Date End Date Taking? Authorizing Provider  albuterol (VENTOLIN HFA) 108 (90 Base) MCG/ACT inhaler Inhale 2 puffs into the lungs every 6 (six) hours as needed for wheezing or shortness of breath.   Yes [provider]  amLODipine (NORVASC) 5 MG tablet Take 5 mg by mouth daily.   Yes [provider]  aspirin 81 MG chewable tablet Chew 81 mg by mouth every evening.    Yes [provider]  atorvastatin (LIPITOR) 20 MG tablet Take 20 mg by mouth daily.   Yes [provider]  levothyroxine (SYNTHROID, LEVOTHROID) 75 MCG tablet Take 75 mcg by mouth daily before breakfast.    Yes [provider]  OVER THE COUNTER MEDICATION TheraTears Liquid Eye Drops- Place 1-2 drops into both eyes one to three times as day as needed for dryness or irritation   Yes [provider]    Blood pressure 127/78, pulse (!) 53, temperature (!) 96.1 F (35.6 C), temperature source Oral, resp. rate 17, height 5\' 6"  (1.676 m), weight 59.2 kg, SpO2 95 %. Physical Exam: General: pleasant, somewhat cachectic male who is laying in bed in NAD HEENT: head is normocephalic, atraumatic.  Sclera are noninjected.  Pupils equal and round.  Ears and nose without any masses or lesions.  Mouth is pink and dry. Heart: regular, rate, and rhythm.  Normal s1,s2. No obvious murmurs, gallops, or rubs noted.  Palpable pedal pulses bilaterally  Lungs: rhonchi noted in LLL, otherwise clear.  Respiratory effort nonlabored on 2L of O2 via  Abd: soft, NT/ND, +BS, no masses, hernias, or organomegaly MS: no BUE/BLE edema, calves soft and nontender, + muscle wasting and atrophy Skin: warm and dry with no masses, lesions, or rashes Psych: A&Ox4 with an  appropriate affect Neuro: cranial nerves grossly intact, normal speech, thought process intact  Results for orders placed or performed during the hospital encounter of 06/17/21 (from the past 48 hour(s))  Basic metabolic panel     Status: Abnormal   Collection Time: 06/18/21  3:58 PM  Result Value Ref Range   Sodium 130 (L) 135 - 145 mmol/L   Potassium 3.9 3.5 - 5.1 mmol/L   Chloride 95 (L) 98 - 111 mmol/L   CO2 25 22 - 32 mmol/L   Glucose, Bld 80 70 - 99 mg/dL    Comment: Glucose reference range applies only to samples taken after fasting for at least 8 hours.   BUN 12 8 - 23 mg/dL   Creatinine, Ser 0.63 0.61 - 1.24 mg/dL   Calcium 8.1 (L) 8.9 - 10.3 mg/dL   GFR, Estimated >60 >60 mL/min    Comment: (  NOTE) Calculated using the CKD-EPI Creatinine Equation (2021)    Anion gap 10 5 - 15    Comment: Performed at [facility], [place] [address] [place], [place] [address]  CBC with Differential/Platelet     Status: Abnormal   Collection Time: 06/19/21  1:08 AM  Result Value Ref Range   WBC 5.9 4.0 - 10.5 K/uL   RBC 3.14 (L) 4.22 - 5.81 MIL/uL   Hemoglobin 9.8 (L) 13.0 - 17.0 g/dL   HCT 28.4 (L) 39.0 - 52.0 %   MCV 90.4 80.0 - 100.0 fL   MCH 31.2 26.0 - 34.0 pg   MCHC 34.5 30.0 - 36.0 g/dL   RDW 14.3 11.5 - 15.5 %   Platelets 263 150 - 400 K/uL   nRBC 0.0 0.0 - 0.2 %   Neutrophils Relative % 90 %   Neutro Abs 5.3 1.7 - 7.7 K/uL   Lymphocytes Relative 2 %   Lymphs Abs 0.1 (L) 0.7 - 4.0 K/uL   Monocytes Relative 8 %   Monocytes Absolute 0.5 0.1 - 1.0 K/uL   Eosinophils Relative 0 %   Eosinophils Absolute 0.0 0.0 - 0.5 K/uL   Basophils Relative 0 %   Basophils Absolute 0.0 0.0 - 0.1 K/uL   WBC Morphology MORPHOLOGY UNREMARKABLE    RBC Morphology MORPHOLOGY UNREMARKABLE    Smear Review MORPHOLOGY UNREMARKABLE    Abs Immature Granulocytes 0.00 0.00 - 0.07 K/uL    Comment: Performed at [facility], [place] [address] [place], [place] [address]  Comprehensive metabolic  panel     Status: Abnormal   Collection Time: 06/19/21  1:08 AM  Result Value Ref Range   Sodium 132 (L) 135 - 145 mmol/L   Potassium 3.8 3.5 - 5.1 mmol/L   Chloride 98 98 - 111 mmol/L   CO2 25 22 - 32 mmol/L   Glucose, Bld 87 70 - 99 mg/dL    Comment: Glucose reference range applies only to samples taken after fasting for at least 8 hours.   BUN 13 8 - 23 mg/dL   Creatinine, Ser 0.57 (L) 0.61 - 1.24 mg/dL   Calcium 7.9 (L) 8.9 - 10.3 mg/dL   Total Protein 5.1 (L) 6.5 - 8.1 g/dL   Albumin 2.1 (L) 3.5 - 5.0 g/dL   AST 23 15 - 41 U/L   ALT 15 0 - 44 U/L   Alkaline Phosphatase 41 38 - 126 U/L   Total Bilirubin 0.9 0.3 - 1.2 mg/dL   GFR, Estimated >60 >60 mL/min    Comment: (NOTE) Calculated using the CKD-EPI Creatinine Equation (2021)    Anion gap 9 5 - 15    Comment: Performed at [facility], [place] [address] [place], [place] [address]  C-reactive protein     Status: Abnormal   Collection Time: 06/19/21  1:08 AM  Result Value Ref Range   CRP 8.7 (H) <1.0 mg/dL    Comment: Performed at [facility] [address] [place],  [address]  D-dimer, quantitative     Status: Abnormal   Collection Time: 06/19/21  1:08 AM  Result Value Ref Range   D-Dimer, Quant 0.85 (H) 0.00 - 0.50 ug/mL-FEU    Comment: (NOTE) At the manufacturer cut-off value of 0.5 g/mL FEU, this assay has a negative predictive value of 95-100%.This assay is intended for use in conjunction with a clinical pretest probability (PTP) assessment model to exclude pulmonary embolism (PE) and deep venous thrombosis (  DVT) in outpatients suspected of PE or DVT. Results should be correlated with clinical presentation. Performed at [facility], [place] [address] [place] [address], [place], [place] [address]   Ferritin     Status: Abnormal   Collection Time: 06/19/21  1:08 AM  Result Value Ref Range   Ferritin 337 (H) 24 - 336 ng/mL    Comment: Performed at [facility], [place] [address] [place], [place]  [address]  Magnesium     Status: None   Collection Time: 06/19/21  1:08 AM  Result Value Ref Range   Magnesium 1.9 1.7 - 2.4 mg/dL    Comment: Performed at [facility] [address] [place], [place] [address]  Phosphorus     Status: None   Collection Time: 06/19/21  1:08 AM  Result Value Ref Range   Phosphorus 3.5 2.5 - 4.6 mg/dL    Comment: Performed at [facility] [address] [place], [place] [address]  CBC with Differential/Platelet     Status: Abnormal   Collection Time: 06/20/21  1:50 AM  Result Value Ref Range   WBC 6.8 4.0 - 10.5 K/uL   RBC 3.42 (L) 4.22 - 5.81 MIL/uL   Hemoglobin 10.4 (L) 13.0 - 17.0 g/dL   HCT 31.4 (L) 39.0 - 52.0 %   MCV 91.8 80.0 - 100.0 fL   MCH 30.4 26.0 - 34.0 pg   MCHC 33.1 30.0 - 36.0 g/dL   RDW 14.5 11.5 - 15.5 %   Platelets 250 150 - 400 K/uL   nRBC 0.0 0.0 - 0.2 %   Neutrophils Relative % 86 %   Neutro Abs 5.9 1.7 - 7.7 K/uL   Lymphocytes Relative 5 %   Lymphs Abs 0.4 (L) 0.7 - 4.0 K/uL   Monocytes Relative 7 %   Monocytes Absolute 0.4 0.1 - 1.0 K/uL   Eosinophils Relative 0 %   Eosinophils Absolute 0.0 0.0 - 0.5 K/uL   Basophils Relative 0 %   Basophils Absolute 0.0 0.0 - 0.1 K/uL   Immature Granulocytes 2 %   Abs Immature Granulocytes 0.10 (H) 0.00 - 0.07 K/uL    Comment: Performed at [facility] [address] [place], [place] [address]  Comprehensive metabolic panel     Status: Abnormal   Collection Time: 06/20/21  1:50 AM  Result Value Ref Range   Sodium 137 135 - 145 mmol/L   Potassium 3.7 3.5 - 5.1 mmol/L   Chloride 103 98 - 111 mmol/L   CO2 27 22 - 32 mmol/L   Glucose, Bld 87 70 - 99 mg/dL    Comment: Glucose reference range applies only to samples taken after fasting for at least 8 hours.   BUN 17 8 - 23 mg/dL   Creatinine, Ser 0.67 0.61 - 1.24 mg/dL   Calcium 8.1 (L) 8.9 - 10.3 mg/dL   Total Protein 5.4 (L) 6.5 - 8.1 g/dL   Albumin 2.2 (L) 3.5 - 5.0 g/dL   AST 22 15 - 41 U/L   ALT 15 0 - 44  U/L   Alkaline Phosphatase 43 38 - 126 U/L   Total Bilirubin 0.8 0.3 - 1.2 mg/dL   GFR, Estimated >60 >60 mL/min    Comment: (NOTE) Calculated using the CKD-EPI Creatinine Equation (2021)    Anion gap 7 5 - 15    Comment: Performed at [facility], [place] [address], [place], [place] [address]  C-reactive protein  Status: Abnormal   Collection Time: 06/20/21  1:50 AM  Result Value Ref Range   CRP 5.5 (H) <1.0 mg/dL    Comment: Performed at [facility] [address] [place], [place] [address]  D-dimer, quantitative     Status: Abnormal   Collection Time: 06/20/21  1:50 AM  Result Value Ref Range   D-Dimer, Quant 0.80 (H) 0.00 - 0.50 ug/mL-FEU    Comment: (NOTE) At the manufacturer cut-off value of 0.5 g/mL FEU, this assay has a negative predictive value of 95-100%.This assay is intended for use in conjunction with a clinical pretest probability (PTP) assessment model to exclude pulmonary embolism (PE) and deep venous thrombosis (DVT) in outpatients suspected of PE or DVT. Results should be correlated with clinical presentation. Performed at [facility], [place] [address] [place], [place] [address]   Ferritin     Status: Abnormal   Collection Time: 06/20/21  1:50 AM  Result Value Ref Range   Ferritin 370 (H) 24 - 336 ng/mL    Comment: Performed at [facility] [address], [place], [place] [address]  Magnesium     Status: None   Collection Time: 06/20/21  1:50 AM  Result Value Ref Range   Magnesium 2.1 1.7 - 2.4 mg/dL    Comment: Performed at [facility] [address], [place], [place] [address]  Phosphorus     Status: None   Collection Time: 06/20/21  1:50 AM  Result Value Ref Range   Phosphorus 2.8 2.5 - 4.6 mg/dL    Comment: Performed at [facility] [address] [place], [place] [address]   CT ABDOMEN WO CONTRAST  Result Date: 06/20/2021 CLINICAL DATA:  Dysphagia.  Evaluate for gastrostomy tube placement. EXAM: CT  ABDOMEN WITHOUT CONTRAST TECHNIQUE: Multidetector CT imaging of the abdomen was performed following the standard protocol without IV contrast. COMPARISON:  Prior renal ultrasound 03/19/2018; CT scan of the chest 03/09/2021 FINDINGS: Lower chest: Trace right pleural effusion with associated atelectasis. Small left sub pulmonic effusion with associated dense atelectasis versus infiltrate in the left lower lobe. Additional patchy foci of ground-glass attenuation airspace opacity are present in the inferior aspect of the middle lobe. Hepatobiliary: Normal hepatic contour morphology. No discrete hepatic lesion. High attenuation material layers within the gallbladder consistent with sludge and/or small stones. No biliary ductal dilatation. Pancreas: Unremarkable. No pancreatic ductal dilatation or surrounding inflammatory changes. Spleen: Normal in size without focal abnormality. Adrenals/Urinary Tract: Diffuse anasarca and intra-abdominal edema results in obscuration of the adrenal glands. No gross abnormality. No hydronephrosis. Grossly stable left upper pole renal cyst compared to prior imaging. Stomach/Bowel: Relatively high position of the stomach with significant colonic interposition. The transverse colon rises to the level of the xiphoid process anterior to the stomach. Vascular/Lymphatic: Limited evaluation in the absence of intravenous contrast. Extensive atherosclerotic calcifications throughout the aorta. No significant lymphadenopathy. Other: Diffuse body wall edema and mesenteric edema. Additionally, there is small volume ascites. Musculoskeletal: No acute fracture or aggressive appearing lytic or blastic osseous lesion. IMPRESSION: 1. Significant colonic interposition. The mid aspect of the transverse colon extends to the level of the xiphoid process completely obscuring the gastric antrum. Anatomically, this patient is NOT a candidate for percutaneous gastrostomy tube placement. 2. Anasarca, mesenteric  edema, small volume ascites and small (left larger than right) pleural effusions consistent with third spacing versus volume overload. 3. Dense consolidation and volume loss in the left lower lobe consistent with  atelectasis. 4. Patchy areas of ground-glass attenuation airspace opacity in the inferior right middle lobe may represent pneumonia or aspiration. 5.  Aortic Atherosclerosis (ICD10-I70.0). 6. Sludge and/or small stones in the gallbladder. Electronically Signed   By: Jacqulynn Cadet M.D.   On: 06/20/2021 08:04     Anti-infectives (From admission, onward)    Start     Dose/Rate Route Frequency Ordered Stop   06/18/21 1000  remdesivir 100 mg in sodium chloride 0.9 % 100 mL IVPB       See Hyperspace for full Linked Orders Report.   100 mg 200 mL/hr over 30 Minutes Intravenous Daily 06/17/21 1923 06/22/21 0959   06/18/21 0700  cefTRIAXone (ROCEPHIN) 2 g in sodium chloride 0.9 % 100 mL IVPB  Status:  Discontinued        2 g 200 mL/hr over 30 Minutes Intravenous Every 24 hours 06/17/21 1548 06/17/21 1916   06/18/21 0700  azithromycin (ZITHROMAX) 500 mg in sodium chloride 0.9 % 250 mL IVPB  Status:  Discontinued        500 mg 250 mL/hr over 60 Minutes Intravenous Every 24 hours 06/17/21 1548 06/17/21 1916   06/17/21 2200  remdesivir 200 mg in sodium chloride 0.9% 250 mL IVPB       See Hyperspace for full Linked Orders Report.   200 mg 580 mL/hr over 30 Minutes Intravenous Once 06/17/21 1923 06/18/21 0058   06/17/21 2000  Ampicillin-Sulbactam (UNASYN) 3 g in sodium chloride 0.9 % 100 mL IVPB        3 g 200 mL/hr over 30 Minutes Intravenous Every 6 hours 06/17/21 1946     06/17/21 1515  cefTRIAXone (ROCEPHIN) 1 g in sodium chloride 0.9 % 100 mL IVPB        1 g 200 mL/hr over 30 Minutes Intravenous  Once 06/17/21 1502 06/17/21 1630   06/17/21 1515  azithromycin (ZITHROMAX) 500 mg in sodium chloride 0.9 % 250 mL IVPB        500 mg 250 mL/hr over 60 Minutes Intravenous  Once 06/17/21 1502  06/17/21 1731       Assessment/Plan Tonsillar cancer s/p radical neck dissection and XRT Dysphagia - Patient with progressive dysphagia since treatment for his tonsillar cancer. He has lost weight due to lack of ability to take in enough nutrition. IR unable to place PEG due anatomic reasons, therefore general surgery has been asked to consider surgical G tube placement. Patient would require a laparoscopic gastrostomy tube placement.  Given the fact that he cannot tolerate enough PO I think it is reasonable to consider. However, patient is currently higher risk for surgical intervention given his pneumonia and malnutrition. I have discussed the patient and his risk factors with the primary service MD who feels he is respiratory wise stable enough for general anesthesia.  He no longer requires steroids as well and these have been discontinued.  I discussed all of these concerns with the patient including risks and complications of the actual surgery itself such as bleeding, infection, leaking g-tube, accidental removal of g-tube that could result in the need for emergent surgery, excoriation around tube site, injury to surrounding structures, worsening of his COVID or respiratory status that may require mechanical ventilation, etc.  He understands all of this and would like to proceed.  ID - currently unasyn and remdesivir VTE - lovenox FEN - IVF, NPO Foley - none  LLL pneumonia - on unasyn COVID-19 - on remdesivir and prednisone Severe malnutrition  Carotid stenosis HTN  Hypothyroidism OSA Code status DNR  Moderate Medical Decision Making  Henreitta Cea, [facility] Surgery 06/20/2021, 10:19 AM Please see Amion for pager number during day hours 7:00am-4:30pm

## 2021-06-20 NOTE — Progress Notes (Signed)
Request to IR for percutaneous gastrostomy placement - CT abd w/o contrast obtained for pre-procedure planneing yesterday shows significant colonic interposition with the mid aspect of the transverse colon extending to the level of the xiphoid process and completely obscuring the gastric antrum. As such he is not a candidate for percutaneous gastrostomy placement in IR, consider surgical consultation if G-tube is still desired.  Primary team aware, order will be cancelled. Please call IR with any questions or concerns.  Candiss Norse, PA-C

## 2021-06-20 NOTE — Progress Notes (Addendum)
Urine                        PROGRESS NOTE        PATIENT DETAILS Name: Alexander Duran Age: 77 y.o. Sex: male Date of Birth: 11-13-1945 Admit Date: 06/17/2021 Admitting Physician Karmen Bongo, MD [id number], Mikeal Hawthorne, MD  Brief Narrative: Patient is a 76 y.o. 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  See below for further details.  Subjective: Lying comfortably in bed-denies any chest pain or shortness of breath.  Objective: Vitals: Blood pressure 133/73, pulse (!) 55, temperature (!) 95.4 F (35.2 C), temperature source Oral, resp. rate 16, height 5\' 6"  (1.676 m), weight 59.2 kg, SpO2 97 %.   Exam: Gen Exam:Alert awake-not in any distress HEENT:atraumatic, normocephalic Chest: B/L clear to auscultation anteriorly CVS:S1S2 regular Abdomen:soft non tender, non distended Extremities:no edema Neurology: Non focal Skin: no rash  Pertinent Labs/Radiology: Recent Labs  Lab 06/17/21 1139 06/18/21 0500 06/18/21 1558 06/19/21 0108 06/20/21 0150  WBC 8.0 5.3  --  5.9 6.8  HGB 11.0* 9.7*  --  9.8* 10.4*  PLT 262 250  --  263 250  NA 120* 127*   < > 132* 137  K 4.1 4.1   < > 3.8 3.7  CREATININE 0.59* 0.56*   < > 0.57* 0.67  AST 38 28  --  23 22  ALT 21 17  --  15 15  ALKPHOS 54 47  --  41 43  BILITOT 0.6 0.5  --  0.9 0.8   < > = values in this interval not displayed.    Assessment/Plan: Aspiration pneumonia: Overall improved-cultures negative-continue IV  Unasyn.  Progressive oropharyngeal dysphagia: Likely sequelae of prior tonsillar cancer with radical neck dissection/radiation-evaluated by SLP-remains n.p.o.-General surgery following for PEG tube placement.  COVID-19 infection: Likely incidental  finding-suspect pneumonia is from aspiration-on Remdesivir.  Do not think patient requires steroids.  History of carotid artery disease: Continue aspirin/statin  HTN: BP stable-continue Norvasc  History of tonsil cancer-s/p radical neck dissection/radiation: Currently in remission-resume outpatient follow-up with oncology.  Hypoalbuminemia with third spacing: Supportive care for now.  Check UA and updated echo.  Nutrition Status: Nutrition Problem: Severe Malnutrition Etiology: chronic illness Signs/Symptoms: severe muscle depletion, severe fat depletion Interventions: Refer to RD note for recommendations  BMI Estimated body mass index is 21.07 kg/m as calculated from the following:   Height as of this encounter: 5\' 6"  (1.676 m).   Weight as of this encounter: 59.2 kg.   Procedures: None Consults: General surgery, IR DVT Prophylaxis: SQ Lovenox Code Status:DNR Family Communication: None at bedside  Time spent: 25 minutes-Greater than 50% of this time was spent in counseling, explanation of diagnosis, planning of further management, and coordination of care.   Disposition Plan: Status is: Inpatient  Remains inpatient appropriate because: Aspiration PNA-on IV antibiotics-incidental COVID-19 infection on IV Remdesivir-severe dysphagia-n.p.o.-needs PEG tube placement   Diet: Diet Order             Diet NPO time specified  Diet effective now                     Antimicrobial agents: Anti-infectives (From admission, onward)    Start     Dose/Rate Route Frequency  Ordered Stop   06/18/21 1000  remdesivir 100 mg in sodium chloride 0.9 % 100 mL IVPB       See Hyperspace for full Linked Orders Report.   100 mg 200 mL/hr over 30 Minutes Intravenous Daily 06/17/21 1923 06/22/21 0959   06/18/21 0700  cefTRIAXone (ROCEPHIN) 2 g in sodium chloride 0.9 % 100 mL IVPB  Status:  Discontinued        2 g 200 mL/hr over 30 Minutes Intravenous Every 24 hours 06/17/21 1548  06/17/21 1916   06/18/21 0700  azithromycin (ZITHROMAX) 500 mg in sodium chloride 0.9 % 250 mL IVPB  Status:  Discontinued        500 mg 250 mL/hr over 60 Minutes Intravenous Every 24 hours 06/17/21 1548 06/17/21 1916   06/17/21 2200  remdesivir 200 mg in sodium chloride 0.9% 250 mL IVPB       See Hyperspace for full Linked Orders Report.   200 mg 580 mL/hr over 30 Minutes Intravenous Once 06/17/21 1923 06/18/21 0058   06/17/21 2000  Ampicillin-Sulbactam (UNASYN) 3 g in sodium chloride 0.9 % 100 mL IVPB        3 g 200 mL/hr over 30 Minutes Intravenous Every 6 hours 06/17/21 1946     06/17/21 1515  cefTRIAXone (ROCEPHIN) 1 g in sodium chloride 0.9 % 100 mL IVPB        1 g 200 mL/hr over 30 Minutes Intravenous  Once 06/17/21 1502 06/17/21 1630   06/17/21 1515  azithromycin (ZITHROMAX) 500 mg in sodium chloride 0.9 % 250 mL IVPB        500 mg 250 mL/hr over 60 Minutes Intravenous  Once 06/17/21 1502 06/17/21 1731        MEDICATIONS: Scheduled Meds:  albuterol  2 puff Inhalation Q6H   aspirin  81 mg Oral QPM   atorvastatin  20 mg Oral Daily   docusate sodium  100 mg Oral BID   enoxaparin (LOVENOX) injection  40 mg Subcutaneous Q24H   levothyroxine  75 mcg Oral QAC breakfast   midodrine  5 mg Oral TID WC   pantoprazole (PROTONIX) IV  40 mg Intravenous Q24H   sodium chloride flush  3 mL Intravenous Q12H   Continuous Infusions:  sodium chloride 100 mL/hr at 06/20/21 0636   ampicillin-sulbactam (UNASYN) IV 3 g (06/20/21 0922)   remdesivir 100 mg in NS 100 mL 100 mg (06/20/21 0919)   PRN Meds:.acetaminophen **OR** acetaminophen, bisacodyl, chlorpheniramine-HYDROcodone, guaiFENesin-dextromethorphan, hydrALAZINE, morphine injection, ondansetron **OR** ondansetron (ZOFRAN) IV, oxyCODONE, polyethylene glycol   I have personally reviewed following labs and imaging studies  LABORATORY DATA: CBC: Recent Labs  Lab 06/17/21 1139 06/18/21 0500 06/19/21 0108 06/20/21 0150  WBC 8.0 5.3  5.9 6.8  NEUTROABS 7.0 5.0 5.3 5.9  HGB 11.0* 9.7* 9.8* 10.4*  HCT 31.7* 28.7* 28.4* 31.4*  MCV 89.8 90.8 90.4 91.8  PLT 262 250 263 381    Basic Metabolic Panel: Recent Labs  Lab 06/17/21 1139 06/18/21 0500 06/18/21 1558 06/19/21 0108 06/20/21 0150  NA 120* 127* 130* 132* 137  K 4.1 4.1 3.9 3.8 3.7  CL 83* 93* 95* 98 103  CO2 28 25 25 25 27   GLUCOSE 85 69* 80 87 87  BUN 9 8 12 13 17   CREATININE 0.59* 0.56* 0.63 0.57* 0.67  CALCIUM 8.2* 8.0* 8.1* 7.9* 8.1*  MG  --  1.7  --  1.9 2.1  PHOS  --  3.6  --  3.5 2.8  GFR: Estimated Creatinine Clearance: 68.9 mL/min (by C-G formula based on SCr of 0.67 mg/dL).  Liver Function Tests: Recent Labs  Lab 06/17/21 1139 06/18/21 0500 06/19/21 0108 06/20/21 0150  AST 38 28 23 22   ALT 21 17 15 15   ALKPHOS 54 47 41 43  BILITOT 0.6 0.5 0.9 0.8  PROT 6.4* 5.4* 5.1* 5.4*  ALBUMIN 2.8* 2.3* 2.1* 2.2*   No results for input(s): LIPASE, AMYLASE in the last 168 hours. No results for input(s): AMMONIA in the last 168 hours.  Coagulation Profile: Recent Labs  Lab 06/17/21 1139  INR 1.0    Cardiac Enzymes: No results for input(s): CKTOTAL, CKMB, CKMBINDEX, TROPONINI in the last 168 hours.  BNP (last 3 results) No results for input(s): PROBNP in the last 8760 hours.  Lipid Profile: No results for input(s): CHOL, HDL, LDLCALC, TRIG, CHOLHDL, LDLDIRECT in the last 72 hours.  Thyroid Function Tests: No results for input(s): TSH, T4TOTAL, FREET4, T3FREE, THYROIDAB in the last 72 hours.  Anemia Panel: Recent Labs    06/19/21 0108 06/20/21 0150  FERRITIN 337* 370*    Urine analysis: No results found for: COLORURINE, APPEARANCEUR, LABSPEC, PHURINE, GLUCOSEU, HGBUR, BILIRUBINUR, KETONESUR, PROTEINUR, UROBILINOGEN, NITRITE, LEUKOCYTESUR  Sepsis Labs: Lactic Acid, Venous    Component Value Date/Time   LATICACIDVEN 1.3 06/17/2021 1139    MICROBIOLOGY: Recent Results (from the past 240 hour(s))  Blood Culture (routine  x 2)     Status: None (Preliminary result)   Collection Time: 06/17/21 11:39 AM   Specimen: BLOOD  Result Value Ref Range Status   Specimen Description BLOOD SITE NOT SPECIFIED  Final   Special Requests   Final    BOTTLES DRAWN AEROBIC AND ANAEROBIC Blood Culture adequate volume   Culture   Final    NO GROWTH 3 DAYS Performed at [facility], [place] [address], [place], [place] [address]    Report Status PENDING  Incomplete  Blood Culture (routine x 2)     Status: None (Preliminary result)   Collection Time: 06/17/21 11:44 AM   Specimen: BLOOD  Result Value Ref Range Status   Specimen Description BLOOD SITE NOT SPECIFIED  Final   Special Requests   Final    BOTTLES DRAWN AEROBIC AND ANAEROBIC Blood Culture adequate volume   Culture   Final    NO GROWTH 3 DAYS Performed at [facility], [address], [place], [place] [address]    Report Status PENDING  Incomplete  Resp Panel by RT-PCR (Flu A&B, Covid) Nasopharyngeal Swab     Status: Abnormal   Collection Time: 06/17/21  3:12 PM   Specimen: Nasopharyngeal Swab; Nasopharyngeal(NP) swabs in vial transport medium  Result Value Ref Range Status   SARS Coronavirus 2 by RT PCR POSITIVE (A) NEGATIVE Final    Comment: (NOTE) SARS-CoV-2 target nucleic acids are DETECTED.  The SARS-CoV-2 RNA is generally detectable in upper respiratory specimens during the acute phase of infection. Positive results are indicative of the presence of the identified virus, but do not rule out bacterial infection or co-infection with other pathogens not detected by the test. Clinical correlation with patient history and other diagnostic information is necessary to determine patient infection status. The expected result is Negative.  Fact Sheet for Patients: EntrepreneurPulse.com.au  Fact Sheet for Healthcare Providers: IncredibleEmployment.be  This test is not yet approved or cleared by the [place] FDA  and  has been authorized for detection and/or diagnosis of SARS-CoV-2 by FDA under an Emergency  Use Authorization (EUA).  This EUA will remain in effect (meaning this test can be used) for the duration of  the COVID-19 declaration under Section 564(b)(1) of the A ct, 21 U.S.C. section 360bbb-3(b)(1), unless the authorization is terminated or revoked sooner.     Influenza A by PCR NEGATIVE NEGATIVE Final   Influenza B by PCR NEGATIVE NEGATIVE Final    Comment: (NOTE) The Xpert Xpress SARS-CoV-2/FLU/RSV plus assay is intended as an aid in the diagnosis of influenza from Nasopharyngeal swab specimens and should not be used as a sole basis for treatment. Nasal washings and aspirates are unacceptable for Xpert Xpress SARS-CoV-2/FLU/RSV testing.  Fact Sheet for Patients: EntrepreneurPulse.com.au  Fact Sheet for Healthcare Providers: IncredibleEmployment.be  This test is not yet approved or cleared by the [place] FDA and has been authorized for detection and/or diagnosis of SARS-CoV-2 by FDA under an Emergency Use Authorization (EUA). This EUA will remain in effect (meaning this test can be used) for the duration of the COVID-19 declaration under Section 564(b)(1) of the Act, 21 U.S.C. section 360bbb-3(b)(1), unless the authorization is terminated or revoked.  Performed at [facility], [place] [address] [place], [place] [address]     RADIOLOGY STUDIES/RESULTS: CT ABDOMEN WO CONTRAST  Result Date: 06/20/2021 CLINICAL DATA:  Dysphagia.  Evaluate for gastrostomy tube placement. EXAM: CT ABDOMEN WITHOUT CONTRAST TECHNIQUE: Multidetector CT imaging of the abdomen was performed following the standard protocol without IV contrast. COMPARISON:  Prior renal ultrasound 03/19/2018; CT scan of the chest 03/09/2021 FINDINGS: Lower chest: Trace right pleural effusion with associated atelectasis. Small left sub pulmonic effusion with associated dense  atelectasis versus infiltrate in the left lower lobe. Additional patchy foci of ground-glass attenuation airspace opacity are present in the inferior aspect of the middle lobe. Hepatobiliary: Normal hepatic contour morphology. No discrete hepatic lesion. High attenuation material layers within the gallbladder consistent with sludge and/or small stones. No biliary ductal dilatation. Pancreas: Unremarkable. No pancreatic ductal dilatation or surrounding inflammatory changes. Spleen: Normal in size without focal abnormality. Adrenals/Urinary Tract: Diffuse anasarca and intra-abdominal edema results in obscuration of the adrenal glands. No gross abnormality. No hydronephrosis. Grossly stable left upper pole renal cyst compared to prior imaging. Stomach/Bowel: Relatively high position of the stomach with significant colonic interposition. The transverse colon rises to the level of the xiphoid process anterior to the stomach. Vascular/Lymphatic: Limited evaluation in the absence of intravenous contrast. Extensive atherosclerotic calcifications throughout the aorta. No significant lymphadenopathy. Other: Diffuse body wall edema and mesenteric edema. Additionally, there is small volume ascites. Musculoskeletal: No acute fracture or aggressive appearing lytic or blastic osseous lesion. IMPRESSION: 1. Significant colonic interposition. The mid aspect of the transverse colon extends to the level of the xiphoid process completely obscuring the gastric antrum. Anatomically, this patient is NOT a candidate for percutaneous gastrostomy tube placement. 2. Anasarca, mesenteric edema, small volume ascites and small (left larger than right) pleural effusions consistent with third spacing versus volume overload. 3. Dense consolidation and volume loss in the left lower lobe consistent with atelectasis. 4. Patchy areas of ground-glass attenuation airspace opacity in the inferior right middle lobe may represent pneumonia or aspiration. 5.   Aortic Atherosclerosis (ICD10-I70.0). 6. Sludge and/or small stones in the gallbladder. Electronically Signed   By: Jacqulynn Cadet M.D.   On: 06/20/2021 08:04     LOS: 3 days   Oren Binet, MD  [facility]    To contact the attending provider between 7A-7P or the covering provider during  after hours 7P-7A, please log into the web site [email or web address] and access using universal [place] password for that web site. If you do not have the password, please call the hospital operator.  06/20/2021, 12:06 PM

## 2021-06-20 NOTE — Progress Notes (Signed)
Initial Nutrition Assessment  DOCUMENTATION CODES:   Severe malnutrition in context of chronic illness  INTERVENTION:   Initiate tube feeds after access is gained: Start Osmolite 1.5 @ 20 mL/hr and advance by 10 mL q8h until goal of 50 mL/hr is met (1200 mL/day) 45 mL ProSource TF - BID 150 mL free water flushes q4h   Provides 1880 kcal, 97 gm PRO, 914 mL free water (1814 mL total free water) daily Monitor magnesium, potassium, and phosphorus BID for at least 3 days. MD to replete as needed, as pt is at risk for refeeding syndrome given malnutrition and poor PO intake.  NUTRITION DIAGNOSIS:   Severe Malnutrition related to chronic illness (Cancer, Dysphagia) as evidenced by severe muscle depletion, severe fat depletion.  GOAL:   Patient will meet greater than or equal to 90% of their needs  MONITOR:   Labs, Weight trends  REASON FOR ASSESSMENT:   Consult Assessment of nutrition requirement/status, Enteral/tube feeding initiation and management  ASSESSMENT:   76 y.o. male presented to the ED with SOB, cough, and chest pain. PMH includes tonsillar cancer - now in remission, cricopharyngeal bar, HTN, severe dysphagia, and hypothyroidism. Pt admitted with pneumonia, possible PEG placement, and COVID-19.   Pt reports that he typically has 2 meals per day and that each meal will take him a few hours to complete. Pt reports that food often gets stuck and then goes into his lung. Pt reports that he typically has 1 Ensure per day at home. Pt reports that he typically eat meat, soups, eggs, cream of wheat, and spinach.  Pt noted to have ill fitting dentures due to significant weight loss.   Per pt he has lost about 60-80# within the last year. Pt reports that he weighed 129# in September, but unable to provide weight prior to weight loss occurring. Unable to determine any weight loss from EMR.   SLP recommends remaining NPO and using alternative means for nutrition and hydration.    Per Radiology's note, pt is not eligible for percutaneous tube placement. Will need surgery consult to evaluate for placement.   Medications reviewed and include: Colace, Solu-Medrol, Protonix, IV antibiotics Labs reviewed.  NUTRITION - FOCUSED PHYSICAL EXAM:  Flowsheet Row Most Recent Value  Orbital Region Moderate depletion  Upper Arm Region Severe depletion  Thoracic and Lumbar Region Severe depletion  Buccal Region Moderate depletion  Temple Region Moderate depletion  Clavicle Bone Region Severe depletion  Clavicle and Acromion Bone Region Severe depletion  Scapular Bone Region Severe depletion  Dorsal Hand Severe depletion  Patellar Region Severe depletion  Anterior Thigh Region Severe depletion  Posterior Calf Region Severe depletion  Edema (RD Assessment) None  Hair Reviewed  Eyes Reviewed  Mouth Reviewed  Skin Reviewed  Nails Reviewed       Diet Order:   Diet Order             Diet NPO time specified  Diet effective now                   EDUCATION NEEDS:   No education needs have been identified at this time  Skin:  Skin Assessment: Reviewed RN Assessment  Last BM:  06/16/2021  Height:   Ht Readings from Last 1 Encounters:  06/18/21 '5\' 6"'  (1.676 m)    Weight:   Wt Readings from Last 1 Encounters:  06/18/21 59.2 kg    Ideal Body Weight:  64.6 kg  BMI:  Body mass index is 21.07  kg/m.  Estimated Nutritional Needs:   Kcal:  1800-2000  Protein:  90-105 grams  Fluid:  >/= 1.8 L    Alexander Duran, RD, LDN Clinical Dietitian See [facility] for contact information.

## 2021-06-20 NOTE — Progress Notes (Signed)
Cortrak Tube Team Note:  Consult received to place a Cortrak feeding tube.  Spoke with pt's RN and Dr Sloan Leiter. Dr Sloan Leiter states with pt's neck anatomy pt is not a candidate for a cortrak tube placement.  Surgery consulted for laparoscopic g tube placement. Pt is not a candidate for PEG placement in IR due to anatomy.  Will d/c cortrak consult, please re-consult as needed.   Lockie Pares., RD, LDN, CNSC See [facility] for contact information

## 2021-06-21 ENCOUNTER — Inpatient Hospital Stay (HOSPITAL_COMMUNITY): Payer: Medicare Other

## 2021-06-21 ENCOUNTER — Encounter (HOSPITAL_COMMUNITY): Payer: Self-pay | Admitting: Internal Medicine

## 2021-06-21 ENCOUNTER — Inpatient Hospital Stay (HOSPITAL_COMMUNITY): Payer: Medicare Other | Admitting: Certified Registered"

## 2021-06-21 ENCOUNTER — Encounter (HOSPITAL_COMMUNITY)
Admission: EM | Disposition: A | Discharge status: Home Health | Payer: Self-pay | Source: Home / Self Care | Attending: Internal Medicine

## 2021-06-21 DIAGNOSIS — R1310 Dysphagia, unspecified: Secondary | ICD-10-CM

## 2021-06-21 DIAGNOSIS — J690 Pneumonitis due to inhalation of food and vomit: Secondary | ICD-10-CM | POA: Diagnosis not present

## 2021-06-21 DIAGNOSIS — R0609 Other forms of dyspnea: Secondary | ICD-10-CM

## 2021-06-21 DIAGNOSIS — E43 Unspecified severe protein-calorie malnutrition: Secondary | ICD-10-CM | POA: Insufficient documentation

## 2021-06-21 DIAGNOSIS — I10 Essential (primary) hypertension: Secondary | ICD-10-CM | POA: Diagnosis not present

## 2021-06-21 DIAGNOSIS — U071 COVID-19: Secondary | ICD-10-CM | POA: Diagnosis not present

## 2021-06-21 HISTORY — PX: LAPAROSCOPIC GASTROSTOMY: SHX5896

## 2021-06-21 LAB — CBC WITH DIFFERENTIAL/PLATELET
Abs Immature Granulocytes: 0.08 10*3/uL — ABNORMAL HIGH (ref 0.00–0.07)
Basophils Absolute: 0 10*3/uL (ref 0.0–0.1)
Basophils Relative: 0 %
Eosinophils Absolute: 0 10*3/uL (ref 0.0–0.5)
Eosinophils Relative: 0 %
HCT: 31.2 % — ABNORMAL LOW (ref 39.0–52.0)
Hemoglobin: 10.7 g/dL — ABNORMAL LOW (ref 13.0–17.0)
Immature Granulocytes: 1 %
Lymphocytes Relative: 6 %
Lymphs Abs: 0.3 10*3/uL — ABNORMAL LOW (ref 0.7–4.0)
MCH: 31.6 pg (ref 26.0–34.0)
MCHC: 34.3 g/dL (ref 30.0–36.0)
MCV: 92 fL (ref 80.0–100.0)
Monocytes Absolute: 0.5 10*3/uL (ref 0.1–1.0)
Monocytes Relative: 9 %
Neutro Abs: 5 10*3/uL (ref 1.7–7.7)
Neutrophils Relative %: 84 %
Platelets: 252 10*3/uL (ref 150–400)
RBC: 3.39 MIL/uL — ABNORMAL LOW (ref 4.22–5.81)
RDW: 14.7 % (ref 11.5–15.5)
WBC: 5.9 10*3/uL (ref 4.0–10.5)
nRBC: 0 % (ref 0.0–0.2)

## 2021-06-21 LAB — COMPREHENSIVE METABOLIC PANEL
ALT: 16 U/L (ref 0–44)
AST: 20 U/L (ref 15–41)
Albumin: 2.3 g/dL — ABNORMAL LOW (ref 3.5–5.0)
Alkaline Phosphatase: 43 U/L (ref 38–126)
Anion gap: 5 (ref 5–15)
BUN: 18 mg/dL (ref 8–23)
CO2: 26 mmol/L (ref 22–32)
Calcium: 8 mg/dL — ABNORMAL LOW (ref 8.9–10.3)
Chloride: 105 mmol/L (ref 98–111)
Creatinine, Ser: 0.61 mg/dL (ref 0.61–1.24)
GFR, Estimated: >60 mL/min (ref >60)
Glucose, Bld: 82 mg/dL (ref 70–99)
Potassium: 3.7 mmol/L (ref 3.5–5.1)
Sodium: 136 mmol/L (ref 135–145)
Total Bilirubin: 0.8 mg/dL (ref 0.3–1.2)
Total Protein: 5.4 g/dL — ABNORMAL LOW (ref 6.5–8.1)

## 2021-06-21 LAB — ECHOCARDIOGRAM COMPLETE
AR max vel: 1.75 cm2
AV Area VTI: 1.78 cm2
AV Area mean vel: 1.71 cm2
AV Mean grad: 4 mmHg
AV Peak grad: 6.8 mmHg
Ao pk vel: 1.3 m/s
Area-P 1/2: 3.89 cm2
Height: 66 in
S' Lateral: 4 cm
Weight: 2088.2 oz

## 2021-06-21 LAB — MAGNESIUM: Magnesium: 2.2 mg/dL (ref 1.7–2.4)

## 2021-06-21 LAB — PHOSPHORUS: Phosphorus: 2.5 mg/dL (ref 2.5–4.6)

## 2021-06-21 LAB — GLUCOSE, CAPILLARY: Glucose-Capillary: 92 mg/dL (ref 70–99)

## 2021-06-21 SURGERY — CREATION, GASTROSTOMY, LAPAROSCOPIC
Anesthesia: General

## 2021-06-21 MED ORDER — OXYCODONE HCL 5 MG PO TABS: 5.0000 mg | ORAL_TABLET | ORAL | Status: DC | PRN

## 2021-06-21 MED ORDER — SUGAMMADEX SODIUM 200 MG/2ML IV SOLN
INTRAVENOUS | Status: DC | PRN
  Administered 2021-06-21: 200 mg via INTRAVENOUS

## 2021-06-21 MED ORDER — FENTANYL CITRATE (PF) 250 MCG/5ML IJ SOLN
INTRAMUSCULAR | Status: AC
  Filled 2021-06-21: qty 5

## 2021-06-21 MED ORDER — BUPIVACAINE-EPINEPHRINE 0.25% -1:200000 IJ SOLN
INTRAMUSCULAR | Status: DC | PRN
  Administered 2021-06-21: 4 mL

## 2021-06-21 MED ORDER — PROPOFOL 10 MG/ML IV BOLUS
INTRAVENOUS | Status: DC | PRN
  Administered 2021-06-21: 100 mg via INTRAVENOUS

## 2021-06-21 MED ORDER — EPHEDRINE SULFATE-NACL 50-0.9 MG/10ML-% IV SOSY
PREFILLED_SYRINGE | INTRAVENOUS | Status: DC | PRN
  Administered 2021-06-21 (×2): 5 mg via INTRAVENOUS

## 2021-06-21 MED ORDER — BUPIVACAINE-EPINEPHRINE (PF) 0.25% -1:200000 IJ SOLN
INTRAMUSCULAR | Status: AC
  Filled 2021-06-21: qty 30

## 2021-06-21 MED ORDER — SUCCINYLCHOLINE CHLORIDE 200 MG/10ML IV SOSY
PREFILLED_SYRINGE | INTRAVENOUS | Status: DC | PRN
Start: 2021-06-21 — End: 2021-06-21
  Administered 2021-06-21: 100 mg via INTRAVENOUS

## 2021-06-21 MED ORDER — FENTANYL CITRATE (PF) 100 MCG/2ML IJ SOLN
INTRAMUSCULAR | Status: DC | PRN
  Administered 2021-06-21 (×2): 50 ug via INTRAVENOUS

## 2021-06-21 MED ORDER — MORPHINE SULFATE (PF) 2 MG/ML IV SOLN: 1.0000 mg | INTRAVENOUS | Status: DC | PRN

## 2021-06-21 MED ORDER — ONDANSETRON HCL 4 MG/2ML IJ SOLN
INTRAMUSCULAR | Status: DC | PRN
Start: 2021-06-21 — End: 2021-06-21
  Administered 2021-06-21: 4 mg via INTRAVENOUS

## 2021-06-21 MED ORDER — PROPOFOL 10 MG/ML IV BOLUS
INTRAVENOUS | Status: AC
  Filled 2021-06-21: qty 20

## 2021-06-21 MED ORDER — ACETAMINOPHEN 10 MG/ML IV SOLN
INTRAVENOUS | Status: DC | PRN
Start: 2021-06-21 — End: 2021-06-21
  Administered 2021-06-21: 1000 mg via INTRAVENOUS

## 2021-06-21 MED ORDER — GLYCOPYRROLATE 0.2 MG/ML IJ SOLN
INTRAMUSCULAR | Status: DC | PRN
  Administered 2021-06-21 (×2): .1 mg via INTRAVENOUS

## 2021-06-21 MED ORDER — ACETAMINOPHEN 10 MG/ML IV SOLN
INTRAVENOUS | Status: AC
  Filled 2021-06-21: qty 100

## 2021-06-21 MED ORDER — 0.9 % SODIUM CHLORIDE (POUR BTL) OPTIME
TOPICAL | Status: DC | PRN
  Administered 2021-06-21: 1000 mL

## 2021-06-21 MED ORDER — METHOCARBAMOL 1000 MG/10ML IJ SOLN
500.0000 mg | Freq: Four times a day (QID) | INTRAVENOUS | Status: DC | PRN
  Filled 2021-06-21: qty 5

## 2021-06-21 MED ORDER — PHENYLEPHRINE 40 MCG/ML (10ML) SYRINGE FOR IV PUSH (FOR BLOOD PRESSURE SUPPORT)
PREFILLED_SYRINGE | INTRAVENOUS | Status: DC | PRN
  Administered 2021-06-21 (×3): 80 ug via INTRAVENOUS

## 2021-06-21 MED ORDER — SUCCINYLCHOLINE CHLORIDE 200 MG/10ML IV SOSY
PREFILLED_SYRINGE | INTRAVENOUS | Status: AC
  Filled 2021-06-21: qty 10

## 2021-06-21 MED ORDER — ESMOLOL HCL 100 MG/10ML IV SOLN
INTRAVENOUS | Status: DC | PRN
Start: 2021-06-21 — End: 2021-06-21
  Administered 2021-06-21 (×2): 10 mg via INTRAVENOUS

## 2021-06-21 MED ORDER — LIDOCAINE 2% (20 MG/ML) 5 ML SYRINGE
INTRAMUSCULAR | Status: DC | PRN
  Administered 2021-06-21: 100 mg via INTRAVENOUS

## 2021-06-21 MED ORDER — SODIUM CHLORIDE 0.9 % IV SOLN: INTRAVENOUS | Status: DC | PRN

## 2021-06-21 MED ORDER — ROCURONIUM BROMIDE 100 MG/10ML IV SOLN
INTRAVENOUS | Status: DC | PRN
Start: 2021-06-21 — End: 2021-06-21
  Administered 2021-06-21: 40 mg via INTRAVENOUS

## 2021-06-21 MED ORDER — LIDOCAINE 2% (20 MG/ML) 5 ML SYRINGE
INTRAMUSCULAR | Status: AC
  Filled 2021-06-21: qty 5

## 2021-06-21 MED ORDER — DEXAMETHASONE SODIUM PHOSPHATE 10 MG/ML IJ SOLN
INTRAMUSCULAR | Status: DC | PRN
Start: 2021-06-21 — End: 2021-06-21
  Administered 2021-06-21: 10 mg via INTRAVENOUS

## 2021-06-21 SURGICAL SUPPLY — 43 items
ADH SKN CLS APL DERMABOND .7 (GAUZE/BANDAGES/DRESSINGS) ×1
APL PRP STRL LF DISP 70% ISPRP (MISCELLANEOUS) ×1
BAG COUNTER SPONGE SURGICOUNT (BAG) ×3 IMPLANT
BAG SPNG CNTER NS LX DISP (BAG) ×1
BAG URINE DRAINAGE (UROLOGICAL SUPPLIES) IMPLANT
BLADE CLIPPER SURG (BLADE) IMPLANT
CANISTER SUCT 3000ML PPV (MISCELLANEOUS) IMPLANT
CHLORAPREP W/TINT 26 (MISCELLANEOUS) ×3 IMPLANT
COVER SURGICAL LIGHT HANDLE (MISCELLANEOUS) ×3 IMPLANT
DECANTER SPIKE VIAL GLASS SM (MISCELLANEOUS) ×3 IMPLANT
DERMABOND ADVANCED (GAUZE/BANDAGES/DRESSINGS) ×1
DERMABOND ADVANCED .7 DNX12 (GAUZE/BANDAGES/DRESSINGS) ×2 IMPLANT
DEVICE TROCAR PUNCTURE CLOSURE (ENDOMECHANICALS) ×1 IMPLANT
ELECT REM PT RETURN 9FT ADLT (ELECTROSURGICAL) ×2
ELECTRODE REM PT RTRN 9FT ADLT (ELECTROSURGICAL) ×2 IMPLANT
GLOVE SURG POLYISO LF SZ7 (GLOVE) ×3 IMPLANT
GLOVE SURG UNDER POLY LF SZ7 (GLOVE) ×3 IMPLANT
GOWN STRL REUS W/ TWL LRG LVL3 (GOWN DISPOSABLE) ×6 IMPLANT
GOWN STRL REUS W/TWL LRG LVL3 (GOWN DISPOSABLE) ×6
GRASPER SUT TROCAR 14GX15 (MISCELLANEOUS) ×1 IMPLANT
KIT BASIN OR (CUSTOM PROCEDURE TRAY) ×3 IMPLANT
KIT TURNOVER KIT B (KITS) ×3 IMPLANT
NDL INSUFFLATION 14GA 120MM (NEEDLE) IMPLANT
NEEDLE 22X1 1/2 (OR ONLY) (NEEDLE) ×3 IMPLANT
NEEDLE INSUFFLATION 14GA 120MM (NEEDLE) ×2 IMPLANT
NS IRRIG 1000ML POUR BTL (IV SOLUTION) ×3 IMPLANT
PAD ARMBOARD 7.5X6 YLW CONV (MISCELLANEOUS) ×6 IMPLANT
PLUG CATH AND CAP STER (CATHETERS) IMPLANT
SCISSORS LAP 5X35 DISP (ENDOMECHANICALS) ×4 IMPLANT
SET IRRIG TUBING LAPAROSCOPIC (IRRIGATION / IRRIGATOR) IMPLANT
SET TUBE SMOKE EVAC HIGH FLOW (TUBING) ×3 IMPLANT
SLEEVE ENDOPATH XCEL 5M (ENDOMECHANICALS) ×9 IMPLANT
SUT ETHILON 2 0 FS 18 (SUTURE) ×1 IMPLANT
SUT MNCRL AB 4-0 PS2 18 (SUTURE) ×3 IMPLANT
SUT SILK 2 0 SH (SUTURE) ×12 IMPLANT
TOWEL GREEN STERILE (TOWEL DISPOSABLE) ×3 IMPLANT
TOWEL GREEN STERILE FF (TOWEL DISPOSABLE) ×3 IMPLANT
TRAY LAPAROSCOPIC MC (CUSTOM PROCEDURE TRAY) ×3 IMPLANT
TROCAR XCEL BLUNT TIP 100MML (ENDOMECHANICALS) IMPLANT
TROCAR XCEL NON-BLD 11X100MML (ENDOMECHANICALS) ×3 IMPLANT
TROCAR XCEL NON-BLD 5MMX100MML (ENDOMECHANICALS) ×3 IMPLANT
TUBE GASTROSTOMY 18F (CATHETERS) ×1 IMPLANT
WATER STERILE IRR 1000ML POUR (IV SOLUTION) ×3 IMPLANT

## 2021-06-21 NOTE — Progress Notes (Signed)
Patient declined abdominal binder this time. Will continue efforts.

## 2021-06-21 NOTE — Transfer of Care (Signed)
Immediate Anesthesia Transfer of Care Note  Patient: Alexander Duran  Procedure(s) Performed: LAPAROSCOPIC GASTROSTOMY TUBE PLACEMENT  Patient Location: PACU  Anesthesia Type:General  Level of Consciousness: drowsy and patient cooperative  Airway & Oxygen Therapy: Patient Spontanous Breathing and Patient connected to face mask oxygen  Post-op Assessment: Report given to RN and Post -op Vital signs reviewed and stable  Post vital signs: Reviewed and stable  Last Vitals:  Vitals Value Taken Time  BP    Temp    Pulse    Resp    SpO2      Last Pain:  Vitals:   06/21/21 1049  TempSrc:   PainSc: 0-No pain      Patients Stated Pain Goal: 0 (48/34/75 8307)  Complications: No notable events documented.

## 2021-06-21 NOTE — Progress Notes (Signed)
Day of Surgery   Subjective/Chief Complaint: No specific physical complaints    Objective: Vital signs in last 24 hours: Temp:  [95.4 F (35.2 C)-98 F (36.7 C)] 97.5 F (36.4 C) (01/05 0829) Pulse Rate:  [51-60] 60 (01/05 0829) Resp:  [15-18] 18 (01/05 0829) BP: (124-142)/(73-87) 141/87 (01/05 0829) SpO2:  [94 %-98 %] 98 % (01/05 0825) Last BM Date: 06/16/21  Intake/Output from previous day: 01/04 0701 - 01/05 0700 In: 1686.3 [I.V.:1586.3; IV Piggyback:100] Out: 550 [Urine:550] Intake/Output this shift: Total I/O In: 480 [P.O.:480] Out: 120 [Urine:120]  Alert, calm Unlabored respirations with intermittent cough and expiratory wheezing Abdomen soft, nontender No extremity edema  Lab Results:  Recent Labs    06/20/21 0150 06/21/21 0203  WBC 6.8 5.9  HGB 10.4* 10.7*  HCT 31.4* 31.2*  PLT 250 252   BMET Recent Labs    06/20/21 0150 06/21/21 0203  NA 137 136  K 3.7 3.7  CL 103 105  CO2 27 26  GLUCOSE 87 82  BUN 17 18  CREATININE 0.67 0.61  CALCIUM 8.1* 8.0*   PT/INR No results for input(s): LABPROT, INR in the last 72 hours. ABG No results for input(s): PHART, HCO3 in the last 72 hours.  Invalid input(s): PCO2, PO2  Studies/Results: CT ABDOMEN WO CONTRAST  Result Date: 06/20/2021 CLINICAL DATA:  Dysphagia.  Evaluate for gastrostomy tube placement. EXAM: CT ABDOMEN WITHOUT CONTRAST TECHNIQUE: Multidetector CT imaging of the abdomen was performed following the standard protocol without IV contrast. COMPARISON:  Prior renal ultrasound 03/19/2018; CT scan of the chest 03/09/2021 FINDINGS: Lower chest: Trace right pleural effusion with associated atelectasis. Small left sub pulmonic effusion with associated dense atelectasis versus infiltrate in the left lower lobe. Additional patchy foci of ground-glass attenuation airspace opacity are present in the inferior aspect of the middle lobe. Hepatobiliary: Normal hepatic contour morphology. No discrete hepatic  lesion. High attenuation material layers within the gallbladder consistent with sludge and/or small stones. No biliary ductal dilatation. Pancreas: Unremarkable. No pancreatic ductal dilatation or surrounding inflammatory changes. Spleen: Normal in size without focal abnormality. Adrenals/Urinary Tract: Diffuse anasarca and intra-abdominal edema results in obscuration of the adrenal glands. No gross abnormality. No hydronephrosis. Grossly stable left upper pole renal cyst compared to prior imaging. Stomach/Bowel: Relatively high position of the stomach with significant colonic interposition. The transverse colon rises to the level of the xiphoid process anterior to the stomach. Vascular/Lymphatic: Limited evaluation in the absence of intravenous contrast. Extensive atherosclerotic calcifications throughout the aorta. No significant lymphadenopathy. Other: Diffuse body wall edema and mesenteric edema. Additionally, there is small volume ascites. Musculoskeletal: No acute fracture or aggressive appearing lytic or blastic osseous lesion. IMPRESSION: 1. Significant colonic interposition. The mid aspect of the transverse colon extends to the level of the xiphoid process completely obscuring the gastric antrum. Anatomically, this patient is NOT a candidate for percutaneous gastrostomy tube placement. 2. Anasarca, mesenteric edema, small volume ascites and small (left larger than right) pleural effusions consistent with third spacing versus volume overload. 3. Dense consolidation and volume loss in the left lower lobe consistent with atelectasis. 4. Patchy areas of ground-glass attenuation airspace opacity in the inferior right middle lobe may represent pneumonia or aspiration. 5.  Aortic Atherosclerosis (ICD10-I70.0). 6. Sludge and/or small stones in the gallbladder. Electronically Signed   By: Jacqulynn Cadet M.D.   On: 06/20/2021 08:04    Anti-infectives: Anti-infectives (From admission, onward)    Start      Dose/Rate Route Frequency Ordered Stop  06/18/21 1000  remdesivir 100 mg in sodium chloride 0.9 % 100 mL IVPB       See Hyperspace for full Linked Orders Report.   100 mg 200 mL/hr over 30 Minutes Intravenous Daily 06/17/21 1923 06/22/21 0959   06/18/21 0700  cefTRIAXone (ROCEPHIN) 2 g in sodium chloride 0.9 % 100 mL IVPB  Status:  Discontinued        2 g 200 mL/hr over 30 Minutes Intravenous Every 24 hours 06/17/21 1548 06/17/21 1916   06/18/21 0700  azithromycin (ZITHROMAX) 500 mg in sodium chloride 0.9 % 250 mL IVPB  Status:  Discontinued        500 mg 250 mL/hr over 60 Minutes Intravenous Every 24 hours 06/17/21 1548 06/17/21 1916   06/17/21 2200  remdesivir 200 mg in sodium chloride 0.9% 250 mL IVPB       See Hyperspace for full Linked Orders Report.   200 mg 580 mL/hr over 30 Minutes Intravenous Once 06/17/21 1923 06/18/21 0058   06/17/21 2000  Ampicillin-Sulbactam (UNASYN) 3 g in sodium chloride 0.9 % 100 mL IVPB        3 g 200 mL/hr over 30 Minutes Intravenous Every 6 hours 06/17/21 1946     06/17/21 1515  cefTRIAXone (ROCEPHIN) 1 g in sodium chloride 0.9 % 100 mL IVPB        1 g 200 mL/hr over 30 Minutes Intravenous  Once 06/17/21 1502 06/17/21 1630   06/17/21 1515  azithromycin (ZITHROMAX) 500 mg in sodium chloride 0.9 % 250 mL IVPB        500 mg 250 mL/hr over 60 Minutes Intravenous  Once 06/17/21 1502 06/17/21 1731       Assessment/Plan: 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 I have discussed with him that he is high risk for complications specifically decompensation of his respiratory failure and wound healing problems and we have gone over the surgical plan as well as other risks involved. Questions have been welcomed and answered to his satisfaction. OR today.    ID - currently unasyn and remdesivir VTE - lovenox FEN - IVF, NPO Foley -  none   LLL pneumonia - on unasyn COVID-19 - on remdesivir and prednisone Severe malnutrition  Carotid stenosis HTN Hypothyroidism OSA Code status DNR  LOS: 4 days    Clovis Riley 06/21/2021

## 2021-06-21 NOTE — Op Note (Signed)
Operative Note  Alexander Duran  [id number]  [id number]  06/21/2021   Surgeon: Romana Juniper MD FACS   Assistant: Margie Billet PA-C   Procedure performed: Laparoscopic stamm gastrostomy tube   Preop diagnosis: Severe malnutrition, dysphagia Post-op diagnosis/intraop findings: Same.  10 French MIC gastrostomy placed, sits just under 2 cm at the skin, balloon with 7 cc of water   Specimens: No Retained items: 64 French gastrostomy tube  EBL: Minimal cc Complications: none   Description of procedure: After obtaining informed consent the patient was taken to the operating room and placed supine on operating room table where general endotracheal anesthesia was initiated, SCDs applied, and a formal timeout was performed.  He is on standing antibiotics.  The abdomen was prepped and draped in the usual sterile fashion.  Peritoneal access was attempted at the umbilicus but the Veress was preperitoneal with insufflation of the abdominal wall, so then transitioned successfully to a left subcostal Veress needle and insufflation to 15 mmHg ensued without incident.  A 5 mm trocar was placed inferior to the umbilicus followed by the camera, and gross inspection did not demonstrate any injury from our entry or other significant intra-abdominal abnormalities except for a small amount of clear ascites in bilateral upper quadrants and the pelvis. Under direct visualization a 5 mm and 12 mm trocar were placed in the left hemiabdomen.  The stomach was inspected and a point on the greater curvature just anterior to the vessels was selected and found to reach very easily to the planned abdominal wall location of the gastrostomy tube.  4 seromuscular sutures of 2-0 silk were placed in the cardinal directions around the planned gastrostomy tube site on the stomach.  These were brought through the abdominal wall using the PMI device and secured with hemostats.  A small incision was made 2 fingerbreadths below the costal  margin in the right midclavicular line and the 18 French MIC gastrostomy tube was inserted through this incision.  A gastrotomy was made with cautery and then the G-tube was placed in the stomach.  The balloon was filled with 7 mL of sterile water and noted to move easily within the lumen of the stomach.  The silks were then gently tied down, gently bringing the stomach up towards the abdominal wall until the balloon was gently opposed within the stomach to the abdominal wall.  The stomach was easily able to reach and is essentially tension-free even with the abdomen insufflated.  The 12 mm trocar site was closed with 0 Vicryl in the fascia using laparoscopic suture passer under direct visualization.  The abdomen was then once again inspected and confirmed to be free of any other injury.  The abdomen was desufflated and the remaining trochars were removed.  Skin incisions were closed with subcuticular Monocryl and Dermabond.  The flange of the G-tube was secured to the abdominal wall with 3 simple interrupted 3-0 nylons.  The patient was then awakened, extubated and taken to PACU in stable condition.    All counts were correct at the completion of the case.

## 2021-06-21 NOTE — Anesthesia Postprocedure Evaluation (Signed)
Anesthesia Post Note  Patient: Alexander Duran  Procedure(s) Performed: [facility]     Patient location during evaluation: PACU Anesthesia Type: General Level of consciousness: awake and alert Pain management: pain level controlled Vital Signs Assessment: post-procedure vital signs reviewed and stable Respiratory status: spontaneous breathing, nonlabored ventilation, respiratory function stable and patient connected to nasal cannula oxygen Cardiovascular status: blood pressure returned to baseline and stable Postop Assessment: no apparent nausea or vomiting Anesthetic complications: no   No notable events documented.  Last Vitals:  Vitals:   06/21/21 1330 06/21/21 1345  BP: 129/70 118/70  Pulse: (!) 58 (!) 111  Resp: 10 13  Temp:  (!) 36.4 C  SpO2: 95% 96%    Last Pain:  Vitals:   06/21/21 1345  TempSrc:   PainSc: 0-No pain                 Vincient Vanaman

## 2021-06-21 NOTE — Anesthesia Procedure Notes (Signed)
Procedure Name: Intubation Date/Time: 06/21/2021 11:47 AM Performed by: Gwyndolyn Saxon, CRNA Pre-anesthesia Checklist: Patient identified, Emergency Drugs available, Suction available and Patient being monitored Patient Re-evaluated:Patient Re-evaluated prior to induction Oxygen Delivery Method: Circle system utilized Preoxygenation: Pre-oxygenation with 100% oxygen Induction Type: IV induction Ventilation: Mask ventilation without difficulty Laryngoscope Size: Glidescope and 3 Grade View: Grade I Tube type: Oral Tube size: 6.5 mm Number of attempts: 1 Airway Equipment and Method: Patient positioned with wedge pillow and Rigid stylet Placement Confirmation: ETT inserted through vocal cords under direct vision, positive ETCO2 and breath sounds checked- equal and bilateral Secured at: 20 cm Tube secured with: Tape Dental Injury: Teeth and Oropharynx as per pre-operative assessment

## 2021-06-21 NOTE — Anesthesia Preprocedure Evaluation (Signed)
Anesthesia Evaluation  Patient identified by MRN, date of birth, ID band Patient awake    Reviewed: Allergy & Precautions, NPO status , Patient's Chart, lab work & pertinent test results  Airway Mallampati: II  TM Distance: >3 FB Neck ROM: Full    Dental  (+) Dental Advisory Given, Edentulous Upper, Edentulous Lower   Pulmonary sleep apnea , former smoker,    Pulmonary exam normal breath sounds clear to auscultation       Cardiovascular hypertension, Pt. on medications + Peripheral Vascular Disease (carotid artery stenosis)  Normal cardiovascular exam Rhythm:Regular Rate:Normal  TTE 2019 Normal LVEF, valves ok  Carotid Doppler 2019 Known RICA occulsion, patent stent LICA   Neuro/Psych  Headaches, negative psych ROS   GI/Hepatic negative GI ROS, Neg liver ROS,   Endo/Other  Hypothyroidism   Renal/GU negative Renal ROS  negative genitourinary   Musculoskeletal  (+) Arthritis ,   Abdominal   Peds  Hematology  (+) Blood dyscrasia (on brilinta, last dose 10/18/19), ,   Anesthesia Other Findings HPI: UNKNOWN SCHLEYER is a 76 y.o. male with medical history significant of tonsillar cancer; carotid stenosis; dysphagia/achalasia;cricopharyngeal bar with chronic aspiration; HTN; hypothyroidism; and OSA presenting with SOB.  He has had swallowing difficulties, progressively worse, since the last 5-10 years.  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  He has a stent on the left carotid, placed in 2018.  He has been cancer free since he completed treatment.  He had an endoscopy by Dr. Loletha Carrow in 2018.  He has severe swallow dysfunction.   Reproductive/Obstetrics                             Anesthesia Physical  Anesthesia Plan  ASA: 3  Anesthesia Plan: General    Post-op Pain Management:    Induction: Intravenous  PONV Risk Score and Plan: 2 and Ondansetron and Dexamethasone  Airway Management Planned: Oral ETT  Additional Equipment: None  Intra-op Plan:   Post-operative Plan: Extubation in OR  Informed Consent: I have reviewed the patients History and Physical, chart, labs and discussed the procedure including the risks, benefits and alternatives for the proposed anesthesia with the patient or authorized representative who has indicated his/her understanding and acceptance.     Dental advisory given  Plan Discussed with: CRNA and Anesthesiologist  Anesthesia Plan Comments: (  )        Anesthesia Quick Evaluation

## 2021-06-21 NOTE — Progress Notes (Signed)
Urine                        PROGRESS NOTE        PATIENT DETAILS Name: Alexander Duran Age: 76 y.o. Sex: male Date of Birth: 10-23-1945 Admit Date: 06/17/2021 Admitting Physician Karmen Bongo, MD OIZ:TIWPY, Mikeal Hawthorne, MD  Brief Narrative: Patient is a 76 y.o. 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  See below for further details.  Subjective: Lying comfortably in bed-no major issues overnight.  For laparoscopic PEG tube placement later today.  Objective: Vitals: Blood pressure (!) 141/87, pulse 60, temperature (!) 97.5 F (36.4 C), temperature source Oral, resp. rate 18, height 5\' 6"  (1.676 m), weight 59.2 kg, SpO2 98 %.   Exam: Gen Exam:Alert awake-not in any distress HEENT:atraumatic, normocephalic Chest: B/L clear to auscultation anteriorly CVS:S1S2 regular Abdomen:soft non tender, non distended Extremities:no edema Neurology: Non focal Skin: no rash   Pertinent Labs/Radiology: Recent Labs  Lab 06/17/21 1139 06/18/21 0500 06/18/21 1558 06/19/21 0108 06/20/21 0150 06/21/21 0203  WBC 8.0 5.3  --  5.9 6.8 5.9  HGB 11.0* 9.7*  --  9.8* 10.4* 10.7*  PLT 262 250  --  263 250 252  NA 120* 127*   < > 132* 137 136  K 4.1 4.1   < > 3.8 3.7 3.7  CREATININE 0.59* 0.56*   < > 0.57* 0.67 0.61  AST 38 28  --  23 22 20   ALT 21 17  --  15 15 16   ALKPHOS 54 47  --  41 43 43  BILITOT 0.6 0.5  --  0.9 0.8 0.8   < > = values in this interval not displayed.     Assessment/Plan: Aspiration pneumonia: Overall improved-on room air-cultures negative-continue IV Unasyn for now.   Progressive oropharyngeal dysphagia: Likely sequelae of prior tonsillar cancer with radical neck dissection/radiation-worsened further due  to COVID-19 infection.  Per IR-not a candidate for PEG tube due to anatomy, scheduled for laparoscopic PEG tube insertion by general surgery today.  COVID-19 infection: Likely incidental finding-suspect pneumonia is from aspiration-we will complete Remdesivir on 1/5.  No longer on steroids.  History of carotid artery disease: Continue aspirin/statin  HTN: BP stable-continue Norvasc  History of tonsil cancer-s/p radical neck dissection/radiation: Currently in remission-resume outpatient follow-up with oncology.  Hypoalbuminemia with third spacing: Supportive care for now.  UA negative for proteinuria-awaiting echo.    Nutrition Status: Nutrition Problem: Severe Malnutrition Etiology: chronic illness (Cancer, Dysphagia) Signs/Symptoms: severe muscle depletion, severe fat depletion Interventions: Refer to RD note for recommendations  BMI Estimated body mass index is 21.07 kg/m as calculated from the following:   Height as of this encounter: 5\' 6"  (1.676 m).   Weight as of this encounter: 59.2 kg.   Procedures: None Consults: General surgery, IR DVT Prophylaxis: SQ Lovenox Code Status:DNR Family Communication: None at bedside  Time spent: 25 minutes-Greater than 50% of this time was spent in counseling, explanation of diagnosis, planning of further management, and coordination of care.   Disposition Plan: Status is: Inpatient  Remains inpatient appropriate because: Aspiration PNA-on IV antibiotics-incidental COVID-19 infection on IV Remdesivir-severe dysphagia-n.p.o.-needs PEG tube placement   Diet: Diet Order  Diet NPO time specified  Diet effective now                     Antimicrobial agents: Anti-infectives (From admission, onward)    Start     Dose/Rate Route Frequency Ordered Stop   06/18/21 1000  remdesivir 100 mg in sodium chloride 0.9 % 100 mL IVPB       See Hyperspace for full Linked Orders Report.   100 mg 200 mL/hr over 30 Minutes  Intravenous Daily 06/17/21 1923 06/21/21 1119   06/18/21 0700  cefTRIAXone (ROCEPHIN) 2 g in sodium chloride 0.9 % 100 mL IVPB  Status:  Discontinued        2 g 200 mL/hr over 30 Minutes Intravenous Every 24 hours 06/17/21 1548 06/17/21 1916   06/18/21 0700  azithromycin (ZITHROMAX) 500 mg in sodium chloride 0.9 % 250 mL IVPB  Status:  Discontinued        500 mg 250 mL/hr over 60 Minutes Intravenous Every 24 hours 06/17/21 1548 06/17/21 1916   06/17/21 2200  remdesivir 200 mg in sodium chloride 0.9% 250 mL IVPB       See Hyperspace for full Linked Orders Report.   200 mg 580 mL/hr over 30 Minutes Intravenous Once 06/17/21 1923 06/18/21 0058   06/17/21 2000  [MAR Hold]  Ampicillin-Sulbactam (UNASYN) 3 g in sodium chloride 0.9 % 100 mL IVPB        (MAR Hold since Thu 06/21/2021 at 1132.Hold Reason: Transfer to a Procedural area)   3 g 200 mL/hr over 30 Minutes Intravenous Every 6 hours 06/17/21 1946     06/17/21 1515  cefTRIAXone (ROCEPHIN) 1 g in sodium chloride 0.9 % 100 mL IVPB        1 g 200 mL/hr over 30 Minutes Intravenous  Once 06/17/21 1502 06/17/21 1630   06/17/21 1515  azithromycin (ZITHROMAX) 500 mg in sodium chloride 0.9 % 250 mL IVPB        500 mg 250 mL/hr over 60 Minutes Intravenous  Once 06/17/21 1502 06/17/21 1731        MEDICATIONS: Scheduled Meds:  [MAR Hold] albuterol  2 puff Inhalation Q6H   [MAR Hold] aspirin  81 mg Oral QPM   [MAR Hold] atorvastatin  20 mg Oral Daily   [MAR Hold] docusate sodium  100 mg Oral BID   [MAR Hold] enoxaparin (LOVENOX) injection  40 mg Subcutaneous Q24H   [MAR Hold] levothyroxine  75 mcg Oral QAC breakfast   [MAR Hold] midodrine  5 mg Oral TID WC   [MAR Hold] pantoprazole (PROTONIX) IV  40 mg Intravenous Q24H   [MAR Hold] sodium chloride flush  3 mL Intravenous Q12H   Continuous Infusions:  sodium chloride 100 mL/hr at 06/20/21 2252   [MAR Hold] ampicillin-sulbactam (UNASYN) IV 3 g (06/21/21 0337)   PRN Meds:.0.9 % irrigation  (POUR BTL), [MAR Hold] acetaminophen **OR** [MAR Hold] acetaminophen, [MAR Hold] bisacodyl, bupivacaine-EPINEPHrine, [MAR Hold] chlorpheniramine-HYDROcodone, [MAR Hold] guaiFENesin-dextromethorphan, [MAR Hold] hydrALAZINE, [MAR Hold]  morphine injection, [MAR Hold] ondansetron **OR** [MAR Hold] ondansetron (ZOFRAN) IV, [MAR Hold] oxyCODONE, [MAR Hold] polyethylene glycol   I have personally reviewed following labs and imaging studies  LABORATORY DATA: CBC: Recent Labs  Lab 06/17/21 1139 06/18/21 0500 06/19/21 0108 06/20/21 0150 06/21/21 0203  WBC 8.0 5.3 5.9 6.8 5.9  NEUTROABS 7.0 5.0 5.3 5.9 5.0  HGB 11.0* 9.7* 9.8* 10.4* 10.7*  HCT 31.7* 28.7* 28.4* 31.4* 31.2*  MCV 89.8 90.8 90.4 91.8 92.0  PLT 262 250 263 250 252     Basic Metabolic Panel: Recent Labs  Lab 06/18/21 0500 06/18/21 1558 06/19/21 0108 06/20/21 0150 06/21/21 0203  NA 127* 130* 132* 137 136  K 4.1 3.9 3.8 3.7 3.7  CL 93* 95* 98 103 105  CO2 25 25 25 27 26   GLUCOSE 69* 80 87 87 82  BUN 8 12 13 17 18   CREATININE 0.56* 0.63 0.57* 0.67 0.61  CALCIUM 8.0* 8.1* 7.9* 8.1* 8.0*  MG 1.7  --  1.9 2.1 2.2  PHOS 3.6  --  3.5 2.8 2.5     GFR: Estimated Creatinine Clearance: 68.9 mL/min (by C-G formula based on SCr of 0.61 mg/dL).  Liver Function Tests: Recent Labs  Lab 06/17/21 1139 06/18/21 0500 06/19/21 0108 06/20/21 0150 06/21/21 0203  AST 38 28 23 22 20   ALT 21 17 15 15 16   ALKPHOS 54 47 41 43 43  BILITOT 0.6 0.5 0.9 0.8 0.8  PROT 6.4* 5.4* 5.1* 5.4* 5.4*  ALBUMIN 2.8* 2.3* 2.1* 2.2* 2.3*    No results for input(s): LIPASE, AMYLASE in the last 168 hours. No results for input(s): AMMONIA in the last 168 hours.  Coagulation Profile: Recent Labs  Lab 06/17/21 1139  INR 1.0     Cardiac Enzymes: No results for input(s): CKTOTAL, CKMB, CKMBINDEX, TROPONINI in the last 168 hours.  BNP (last 3 results) No results for input(s): PROBNP in the last 8760 hours.  Lipid Profile: No results for  input(s): CHOL, HDL, LDLCALC, TRIG, CHOLHDL, LDLDIRECT in the last 72 hours.  Thyroid Function Tests: No results for input(s): TSH, T4TOTAL, FREET4, T3FREE, THYROIDAB in the last 72 hours.  Anemia Panel: Recent Labs    06/19/21 0108 06/20/21 0150  FERRITIN 337* 370*     Urine analysis:    Component Value Date/Time   COLORURINE YELLOW 06/20/2021 2227   APPEARANCEUR HAZY (A) 06/20/2021 2227   LABSPEC 1.030 06/20/2021 2227   PHURINE 5.0 06/20/2021 2227   GLUCOSEU NEGATIVE 06/20/2021 2227   HGBUR NEGATIVE 06/20/2021 2227   BILIRUBINUR NEGATIVE 06/20/2021 2227   KETONESUR 20 (A) 06/20/2021 2227   PROTEINUR NEGATIVE 06/20/2021 2227   NITRITE NEGATIVE 06/20/2021 2227   LEUKOCYTESUR NEGATIVE 06/20/2021 2227    Sepsis Labs: Lactic Acid, Venous    Component Value Date/Time   LATICACIDVEN 1.3 06/17/2021 1139    MICROBIOLOGY: Recent Results (from the past 240 hour(s))  Blood Culture (routine x 2)     Status: None (Preliminary result)   Collection Time: 06/17/21 11:39 AM   Specimen: BLOOD  Result Value Ref Range Status   Specimen Description BLOOD SITE NOT SPECIFIED  Final   Special Requests   Final    BOTTLES DRAWN AEROBIC AND ANAEROBIC Blood Culture adequate volume   Culture   Final    NO GROWTH 4 DAYS Performed at [facility], [place] [address] [place], [place] [address]    Report Status PENDING  Incomplete  Blood Culture (routine x 2)     Status: None (Preliminary result)   Collection Time: 06/17/21 11:44 AM   Specimen: BLOOD  Result Value Ref Range Status   Specimen Description BLOOD SITE NOT SPECIFIED  Final   Special Requests   Final    BOTTLES DRAWN AEROBIC AND ANAEROBIC Blood Culture adequate volume   Culture   Final    NO GROWTH 4 DAYS Performed at [facility], [address], [place],  [address]    Report Status PENDING  Incomplete  Resp Panel by RT-PCR (Flu A&B, Covid) Nasopharyngeal Swab     Status: Abnormal   Collection Time:  06/17/21  3:12 PM   Specimen: Nasopharyngeal Swab; Nasopharyngeal(NP) swabs in vial transport medium  Result Value Ref Range Status   SARS Coronavirus 2 by RT PCR POSITIVE (A) NEGATIVE Final    Comment: (NOTE) SARS-CoV-2 target nucleic acids are DETECTED.  The SARS-CoV-2 RNA is generally detectable in upper respiratory specimens during the acute phase of infection. Positive results are indicative of the presence of the identified virus, but do not rule out bacterial infection or co-infection with other pathogens not detected by the test. Clinical correlation with patient history and other diagnostic information is necessary to determine patient infection status. The expected result is Negative.  Fact Sheet for Patients: EntrepreneurPulse.com.au  Fact Sheet for Healthcare Providers: IncredibleEmployment.be  This test is not yet approved or cleared by the [place] FDA and  has been authorized for detection and/or diagnosis of SARS-CoV-2 by FDA under an Emergency Use Authorization (EUA).  This EUA will remain in effect (meaning this test can be used) for the duration of  the COVID-19 declaration under Section 564(b)(1) of the A ct, 21 U.S.C. section 360bbb-3(b)(1), unless the authorization is terminated or revoked sooner.     Influenza A by PCR NEGATIVE NEGATIVE Final   Influenza B by PCR NEGATIVE NEGATIVE Final    Comment: (NOTE) The Xpert Xpress SARS-CoV-2/FLU/RSV plus assay is intended as an aid in the diagnosis of influenza from Nasopharyngeal swab specimens and should not be used as a sole basis for treatment. Nasal washings and aspirates are unacceptable for Xpert Xpress SARS-CoV-2/FLU/RSV testing.  Fact Sheet for Patients: EntrepreneurPulse.com.au  Fact Sheet for Healthcare Providers: IncredibleEmployment.be  This test is not yet approved or cleared by the [place] FDA and has been  authorized for detection and/or diagnosis of SARS-CoV-2 by FDA under an Emergency Use Authorization (EUA). This EUA will remain in effect (meaning this test can be used) for the duration of the COVID-19 declaration under Section 564(b)(1) of the Act, 21 U.S.C. section 360bbb-3(b)(1), unless the authorization is terminated or revoked.  Performed at [facility], [place] [address] [place], [place] [address]     RADIOLOGY STUDIES/RESULTS: CT ABDOMEN WO CONTRAST  Result Date: 06/20/2021 CLINICAL DATA:  Dysphagia.  Evaluate for gastrostomy tube placement. EXAM: CT ABDOMEN WITHOUT CONTRAST TECHNIQUE: Multidetector CT imaging of the abdomen was performed following the standard protocol without IV contrast. COMPARISON:  Prior renal ultrasound 03/19/2018; CT scan of the chest 03/09/2021 FINDINGS: Lower chest: Trace right pleural effusion with associated atelectasis. Small left sub pulmonic effusion with associated dense atelectasis versus infiltrate in the left lower lobe. Additional patchy foci of ground-glass attenuation airspace opacity are present in the inferior aspect of the middle lobe. Hepatobiliary: Normal hepatic contour morphology. No discrete hepatic lesion. High attenuation material layers within the gallbladder consistent with sludge and/or small stones. No biliary ductal dilatation. Pancreas: Unremarkable. No pancreatic ductal dilatation or surrounding inflammatory changes. Spleen: Normal in size without focal abnormality. Adrenals/Urinary Tract: Diffuse anasarca and intra-abdominal edema results in obscuration of the adrenal glands. No gross abnormality. No hydronephrosis. Grossly stable left upper pole renal cyst compared to prior imaging. Stomach/Bowel: Relatively high position of the stomach with significant colonic interposition. The transverse colon rises to the level of the xiphoid process anterior to the stomach. Vascular/Lymphatic: Limited evaluation in the absence of intravenous  contrast. Extensive atherosclerotic calcifications throughout the aorta. No significant lymphadenopathy.  Other: Diffuse body wall edema and mesenteric edema. Additionally, there is small volume ascites. Musculoskeletal: No acute fracture or aggressive appearing lytic or blastic osseous lesion. IMPRESSION: 1. Significant colonic interposition. The mid aspect of the transverse colon extends to the level of the xiphoid process completely obscuring the gastric antrum. Anatomically, this patient is NOT a candidate for percutaneous gastrostomy tube placement. 2. Anasarca, mesenteric edema, small volume ascites and small (left larger than right) pleural effusions consistent with third spacing versus volume overload. 3. Dense consolidation and volume loss in the left lower lobe consistent with atelectasis. 4. Patchy areas of ground-glass attenuation airspace opacity in the inferior right middle lobe may represent pneumonia or aspiration. 5.  Aortic Atherosclerosis (ICD10-I70.0). 6. Sludge and/or small stones in the gallbladder. Electronically Signed   By: Jacqulynn Cadet M.D.   On: 06/20/2021 08:04     LOS: 4 days   Oren Binet, MD  [facility]    To contact the attending provider between 7A-7P or the covering provider during after hours 7P-7A, please log into the web site [email or web address] and access using universal [place] password for that web site. If you do not have the password, please call the hospital operator.  06/21/2021, 12:15 PM

## 2021-06-22 ENCOUNTER — Encounter (HOSPITAL_COMMUNITY): Payer: Self-pay | Admitting: Surgery

## 2021-06-22 DIAGNOSIS — U071 COVID-19: Secondary | ICD-10-CM | POA: Diagnosis not present

## 2021-06-22 DIAGNOSIS — I10 Essential (primary) hypertension: Secondary | ICD-10-CM | POA: Diagnosis not present

## 2021-06-22 DIAGNOSIS — J690 Pneumonitis due to inhalation of food and vomit: Secondary | ICD-10-CM | POA: Diagnosis not present

## 2021-06-22 DIAGNOSIS — R1310 Dysphagia, unspecified: Secondary | ICD-10-CM | POA: Diagnosis not present

## 2021-06-22 LAB — CBC WITH DIFFERENTIAL/PLATELET
Abs Immature Granulocytes: 0.06 10*3/uL (ref 0.00–0.07)
Basophils Absolute: 0 10*3/uL (ref 0.0–0.1)
Basophils Relative: 0 %
Eosinophils Absolute: 0 10*3/uL (ref 0.0–0.5)
Eosinophils Relative: 0 %
HCT: 31.5 % — ABNORMAL LOW (ref 39.0–52.0)
Hemoglobin: 10.6 g/dL — ABNORMAL LOW (ref 13.0–17.0)
Immature Granulocytes: 1 %
Lymphocytes Relative: 5 %
Lymphs Abs: 0.3 10*3/uL — ABNORMAL LOW (ref 0.7–4.0)
MCH: 31.4 pg (ref 26.0–34.0)
MCHC: 33.7 g/dL (ref 30.0–36.0)
MCV: 93.2 fL (ref 80.0–100.0)
Monocytes Absolute: 0.5 10*3/uL (ref 0.1–1.0)
Monocytes Relative: 10 %
Neutro Abs: 4.7 10*3/uL (ref 1.7–7.7)
Neutrophils Relative %: 84 %
Platelets: 240 10*3/uL (ref 150–400)
RBC: 3.38 MIL/uL — ABNORMAL LOW (ref 4.22–5.81)
RDW: 14.9 % (ref 11.5–15.5)
WBC: 5.5 10*3/uL (ref 4.0–10.5)
nRBC: 0 % (ref 0.0–0.2)

## 2021-06-22 LAB — CULTURE, BLOOD (ROUTINE X 2)
Culture: NO GROWTH
Culture: NO GROWTH
Special Requests: ADEQUATE
Special Requests: ADEQUATE

## 2021-06-22 LAB — COMPREHENSIVE METABOLIC PANEL
ALT: 13 U/L (ref 0–44)
AST: 22 U/L (ref 15–41)
Albumin: 2.2 g/dL — ABNORMAL LOW (ref 3.5–5.0)
Alkaline Phosphatase: 40 U/L (ref 38–126)
Anion gap: 6 (ref 5–15)
BUN: 13 mg/dL (ref 8–23)
CO2: 29 mmol/L (ref 22–32)
Calcium: 7.8 mg/dL — ABNORMAL LOW (ref 8.9–10.3)
Chloride: 103 mmol/L (ref 98–111)
Creatinine, Ser: 0.54 mg/dL — ABNORMAL LOW (ref 0.61–1.24)
GFR, Estimated: >60 mL/min (ref >60)
Glucose, Bld: 89 mg/dL (ref 70–99)
Potassium: 4.5 mmol/L (ref 3.5–5.1)
Sodium: 138 mmol/L (ref 135–145)
Total Bilirubin: 0.5 mg/dL (ref 0.3–1.2)
Total Protein: 5.3 g/dL — ABNORMAL LOW (ref 6.5–8.1)

## 2021-06-22 LAB — GLUCOSE, CAPILLARY
Glucose-Capillary: 93 mg/dL (ref 70–99)
Glucose-Capillary: 107 mg/dL — ABNORMAL HIGH (ref 70–99)

## 2021-06-22 LAB — PHOSPHORUS: Phosphorus: 2.6 mg/dL (ref 2.5–4.6)

## 2021-06-22 LAB — MAGNESIUM: Magnesium: 2.1 mg/dL (ref 1.7–2.4)

## 2021-06-22 MED ORDER — ACETAMINOPHEN 160 MG/5ML PO SOLN
650.0000 mg | Freq: Four times a day (QID) | ORAL | Status: DC | PRN
  Administered 2021-06-24 (×2): 650 mg
  Filled 2021-06-22 (×3): qty 20.3

## 2021-06-22 MED ORDER — LEVOTHYROXINE SODIUM 75 MCG PO TABS
75.0000 ug | ORAL_TABLET | Freq: Every day | ORAL | Status: DC
  Administered 2021-06-23 – 2021-06-27 (×5): 75 ug
  Filled 2021-06-22 (×5): qty 1

## 2021-06-22 MED ORDER — PROSOURCE TF PO LIQD
45.0000 mL | Freq: Two times a day (BID) | ORAL | Status: DC
  Administered 2021-06-22 – 2021-06-25 (×7): 45 mL
  Filled 2021-06-22 (×7): qty 45

## 2021-06-22 MED ORDER — OSMOLITE 1.2 CAL PO LIQD: 1000.0000 mL | ORAL | Status: DC

## 2021-06-22 MED ORDER — ONDANSETRON HCL 4 MG/2ML IJ SOLN: 4.0000 mg | Freq: Four times a day (QID) | INTRAMUSCULAR | Status: DC | PRN

## 2021-06-22 MED ORDER — HYDROCOD POLST-CPM POLST ER 10-8 MG/5ML PO SUER
5.0000 mL | Freq: Two times a day (BID) | ORAL | Status: DC | PRN
  Administered 2021-06-23: 5 mL
  Filled 2021-06-22: qty 5

## 2021-06-22 MED ORDER — DOCUSATE SODIUM 50 MG/5ML PO LIQD
100.0000 mg | Freq: Two times a day (BID) | ORAL | Status: DC
  Administered 2021-06-22 – 2021-06-27 (×11): 100 mg
  Filled 2021-06-22 (×11): qty 10

## 2021-06-22 MED ORDER — MIDODRINE HCL 5 MG PO TABS
5.0000 mg | ORAL_TABLET | Freq: Three times a day (TID) | ORAL | Status: DC
  Administered 2021-06-22 – 2021-06-27 (×14): 5 mg
  Filled 2021-06-22 (×15): qty 1

## 2021-06-22 MED ORDER — OSMOLITE 1.5 CAL PO LIQD
1000.0000 mL | ORAL | Status: DC
  Administered 2021-06-22 – 2021-06-24 (×3): 1000 mL
  Filled 2021-06-22 (×5): qty 1000

## 2021-06-22 MED ORDER — ASPIRIN 81 MG PO CHEW
81.0000 mg | CHEWABLE_TABLET | Freq: Every evening | ORAL | Status: DC
  Administered 2021-06-22 – 2021-06-27 (×6): 81 mg
  Filled 2021-06-22 (×6): qty 1

## 2021-06-22 MED ORDER — GUAIFENESIN-DM 100-10 MG/5ML PO SYRP
10.0000 mL | ORAL_SOLUTION | ORAL | Status: DC | PRN
  Administered 2021-06-23 – 2021-06-25 (×6): 10 mL
  Filled 2021-06-22 (×6): qty 10

## 2021-06-22 MED ORDER — ONDANSETRON HCL 4 MG PO TABS: 4.0000 mg | ORAL_TABLET | Freq: Four times a day (QID) | ORAL | Status: DC | PRN

## 2021-06-22 MED ORDER — OXYCODONE HCL 5 MG/5ML PO SOLN
5.0000 mg | ORAL | Status: DC | PRN
  Administered 2021-06-22 (×2): 5 mg
  Administered 2021-06-23: 10 mg
  Administered 2021-06-23 (×4): 5 mg
  Administered 2021-06-24 (×2): 10 mg
  Administered 2021-06-24: 5 mg
  Filled 2021-06-22 (×4): qty 5
  Filled 2021-06-22: qty 10
  Filled 2021-06-22: qty 5
  Filled 2021-06-22 (×2): qty 10
  Filled 2021-06-22: qty 5
  Filled 2021-06-22: qty 10

## 2021-06-22 MED ORDER — ATORVASTATIN CALCIUM 10 MG PO TABS
20.0000 mg | ORAL_TABLET | Freq: Every day | ORAL | Status: DC
  Administered 2021-06-23 – 2021-06-27 (×5): 20 mg
  Filled 2021-06-22 (×5): qty 2

## 2021-06-22 MED ORDER — OSMOLITE 1.5 CAL PO LIQD
1000.0000 mL | ORAL | Status: DC
Start: 2021-06-22 — End: 2021-06-22

## 2021-06-22 MED ORDER — POLYETHYLENE GLYCOL 3350 17 G PO PACK: 17.0000 g | PACK | Freq: Every day | ORAL | Status: DC | PRN

## 2021-06-22 NOTE — Progress Notes (Signed)
Urine                        PROGRESS NOTE        PATIENT DETAILS Name: Alexander Duran Age: 76 y.o. Sex: male Date of Birth: Feb 10, 1946 Admit Date: 06/17/2021 Admitting Physician Karmen Bongo, MD TWS:FKCLE, Mikeal Hawthorne, MD  Brief Narrative: Patient is a 76 y.o. 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  See below for further details.  Subjective: Some pain at the G-tube insertion site.  Objective: Vitals: Blood pressure (!) 145/71, pulse (!) 53, temperature 98.1 F (36.7 C), temperature source Oral, resp. rate 15, height 5\' 6"  (1.676 m), weight 59.2 kg, SpO2 97 %.   Exam: Gen Exam:Alert awake-not in any distress HEENT:atraumatic, normocephalic Chest: B/L clear to auscultation anteriorly CVS:S1S2 regular Abdomen: Soft-appropriately tender-abdominal binder in place. Extremities:no edema Neurology: Non focal Skin: no rash   Pertinent Labs/Radiology: Recent Labs  Lab 06/18/21 0500 06/18/21 1558 06/19/21 0108 06/20/21 0150 06/21/21 0203 06/22/21 0148  WBC 5.3  --  5.9 6.8 5.9 5.5  HGB 9.7*  --  9.8* 10.4* 10.7* 10.6*  PLT 250  --  263 250 252 240  NA 127*   < > 132* 137 136 138  K 4.1   < > 3.8 3.7 3.7 4.5  CREATININE 0.56*   < > 0.57* 0.67 0.61 0.54*  AST 28  --  23 22 20 22   ALT 17  --  15 15 16 13   ALKPHOS 47  --  41 43 43 40  BILITOT 0.5  --  0.9 0.8 0.8 0.5   < > = values in this interval not displayed.     Assessment/Plan: Aspiration pneumonia:Overall much improved-continue Unasyn for 1 additional day.  All cultures remain negative so far.  Progressive oropharyngeal dysphagia: Likely sequelae of prior tonsillar cancer with radical neck dissection/radiation-worsened further due to COVID-19  infection.  Evaluated by general surgery-s/p laparoscopic PEG tube insertion on 1/5.  Dietitian consulted to initiate feedings.    COVID-19 infection: Likely incidental finding-suspect pneumonia is from aspiration-has completed a course of Remdesivir.   History of carotid artery disease: Continue aspirin/statin  HTN: BP stable-continue Norvasc  History of tonsil cancer-s/p radical neck dissection/radiation: Currently in remission-resume outpatient follow-up with oncology.  Hypoalbuminemia with third spacing: Supportive care for now.  UA negative for proteinuria-echo with stable EF.  Nutrition Status: Nutrition Problem: Severe Malnutrition Etiology: chronic illness (Cancer, Dysphagia) Signs/Symptoms: severe muscle depletion, severe fat depletion Interventions: Tube feeding, Prostat  BMI Estimated body mass index is 21.07 kg/m as calculated from the following:   Height as of this encounter: 5\' 6"  (1.676 m).   Weight as of this encounter: 59.2 kg.   Procedures: None Consults: General surgery, IR DVT Prophylaxis: SQ Lovenox Code Status:DNR Family Communication: None at bedside  Time spent: 25 minutes-Greater than 50% of this time was spent in counseling, explanation of diagnosis, planning of further management, and coordination of care.   Disposition Plan: Status is: Inpatient  Remains inpatient appropriate because: Aspiration PNA-on IV antibiotics-incidental COVID-19 infection on IV Remdesivir-severe dysphagia-n.p.o.-needs PEG tube placement   Diet: Diet Order             Diet NPO time specified  Diet effective now  Antimicrobial agents: Anti-infectives (From admission, onward)    Start     Dose/Rate Route Frequency Ordered Stop   06/18/21 1000  remdesivir 100 mg in sodium chloride 0.9 % 100 mL IVPB       See Hyperspace for full Linked Orders Report.   100 mg 200 mL/hr over 30 Minutes Intravenous Daily 06/17/21 1923 06/21/21 1119   06/18/21  0700  cefTRIAXone (ROCEPHIN) 2 g in sodium chloride 0.9 % 100 mL IVPB  Status:  Discontinued        2 g 200 mL/hr over 30 Minutes Intravenous Every 24 hours 06/17/21 1548 06/17/21 1916   06/18/21 0700  azithromycin (ZITHROMAX) 500 mg in sodium chloride 0.9 % 250 mL IVPB  Status:  Discontinued        500 mg 250 mL/hr over 60 Minutes Intravenous Every 24 hours 06/17/21 1548 06/17/21 1916   06/17/21 2200  remdesivir 200 mg in sodium chloride 0.9% 250 mL IVPB       See Hyperspace for full Linked Orders Report.   200 mg 580 mL/hr over 30 Minutes Intravenous Once 06/17/21 1923 06/18/21 0058   06/17/21 2000  Ampicillin-Sulbactam (UNASYN) 3 g in sodium chloride 0.9 % 100 mL IVPB        3 g 200 mL/hr over 30 Minutes Intravenous Every 6 hours 06/17/21 1946     06/17/21 1515  cefTRIAXone (ROCEPHIN) 1 g in sodium chloride 0.9 % 100 mL IVPB        1 g 200 mL/hr over 30 Minutes Intravenous  Once 06/17/21 1502 06/17/21 1630   06/17/21 1515  azithromycin (ZITHROMAX) 500 mg in sodium chloride 0.9 % 250 mL IVPB        500 mg 250 mL/hr over 60 Minutes Intravenous  Once 06/17/21 1502 06/17/21 1731        MEDICATIONS: Scheduled Meds:  albuterol  2 puff Inhalation Q6H   aspirin  81 mg Oral QPM   atorvastatin  20 mg Oral Daily   docusate  100 mg Per Tube BID   enoxaparin (LOVENOX) injection  40 mg Subcutaneous Q24H   levothyroxine  75 mcg Oral QAC breakfast   midodrine  5 mg Oral TID WC   pantoprazole (PROTONIX) IV  40 mg Intravenous Q24H   sodium chloride flush  3 mL Intravenous Q12H   Continuous Infusions:  sodium chloride 100 mL/hr at 06/21/21 1701   ampicillin-sulbactam (UNASYN) IV 3 g (06/22/21 0817)   methocarbamol (ROBAXIN) IV     PRN Meds:.acetaminophen (TYLENOL) oral liquid 160 mg/5 mL, bisacodyl, chlorpheniramine-HYDROcodone, guaiFENesin-dextromethorphan, hydrALAZINE, methocarbamol (ROBAXIN) IV, morphine injection, ondansetron **OR** ondansetron (ZOFRAN) IV, oxyCODONE, polyethylene  glycol   I have personally reviewed following labs and imaging studies  LABORATORY DATA: CBC: Recent Labs  Lab 06/18/21 0500 06/19/21 0108 06/20/21 0150 06/21/21 0203 06/22/21 0148  WBC 5.3 5.9 6.8 5.9 5.5  NEUTROABS 5.0 5.3 5.9 5.0 4.7  HGB 9.7* 9.8* 10.4* 10.7* 10.6*  HCT 28.7* 28.4* 31.4* 31.2* 31.5*  MCV 90.8 90.4 91.8 92.0 93.2  PLT 250 263 250 252 240     Basic Metabolic Panel: Recent Labs  Lab 06/18/21 0500 06/18/21 1558 06/19/21 0108 06/20/21 0150 06/21/21 0203 06/22/21 0148  NA 127* 130* 132* 137 136 138  K 4.1 3.9 3.8 3.7 3.7 4.5  CL 93* 95* 98 103 105 103  CO2 25 25 25 27 26 29   GLUCOSE 69* 80 87 87 82 89  BUN 8 12 13 17 18 13   CREATININE 0.56* 0.63  0.57* 0.67 0.61 0.54*  CALCIUM 8.0* 8.1* 7.9* 8.1* 8.0* 7.8*  MG 1.7  --  1.9 2.1 2.2 2.1  PHOS 3.6  --  3.5 2.8 2.5 2.6     GFR: Estimated Creatinine Clearance: 68.9 mL/min (A) (by C-G formula based on SCr of 0.54 mg/dL (L)).  Liver Function Tests: Recent Labs  Lab 06/18/21 0500 06/19/21 0108 06/20/21 0150 06/21/21 0203 06/22/21 0148  AST 28 23 22 20 22   ALT 17 15 15 16 13   ALKPHOS 47 41 43 43 40  BILITOT 0.5 0.9 0.8 0.8 0.5  PROT 5.4* 5.1* 5.4* 5.4* 5.3*  ALBUMIN 2.3* 2.1* 2.2* 2.3* 2.2*    No results for input(s): LIPASE, AMYLASE in the last 168 hours. No results for input(s): AMMONIA in the last 168 hours.  Coagulation Profile: Recent Labs  Lab 06/17/21 1139  INR 1.0     Cardiac Enzymes: No results for input(s): CKTOTAL, CKMB, CKMBINDEX, TROPONINI in the last 168 hours.  BNP (last 3 results) No results for input(s): PROBNP in the last 8760 hours.  Lipid Profile: No results for input(s): CHOL, HDL, LDLCALC, TRIG, CHOLHDL, LDLDIRECT in the last 72 hours.  Thyroid Function Tests: No results for input(s): TSH, T4TOTAL, FREET4, T3FREE, THYROIDAB in the last 72 hours.  Anemia Panel: Recent Labs    06/20/21 0150  FERRITIN 370*     Urine analysis:    Component Value  Date/Time   COLORURINE YELLOW 06/20/2021 2227   APPEARANCEUR HAZY (A) 06/20/2021 2227   LABSPEC 1.030 06/20/2021 2227   PHURINE 5.0 06/20/2021 2227   GLUCOSEU NEGATIVE 06/20/2021 2227   HGBUR NEGATIVE 06/20/2021 2227   BILIRUBINUR NEGATIVE 06/20/2021 2227   KETONESUR 20 (A) 06/20/2021 2227   PROTEINUR NEGATIVE 06/20/2021 2227   NITRITE NEGATIVE 06/20/2021 2227   LEUKOCYTESUR NEGATIVE 06/20/2021 2227    Sepsis Labs: Lactic Acid, Venous    Component Value Date/Time   LATICACIDVEN 1.3 06/17/2021 1139    MICROBIOLOGY: Recent Results (from the past 240 hour(s))  Blood Culture (routine x 2)     Status: None   Collection Time: 06/17/21 11:39 AM   Specimen: BLOOD  Result Value Ref Range Status   Specimen Description BLOOD SITE NOT SPECIFIED  Final   Special Requests   Final    BOTTLES DRAWN AEROBIC AND ANAEROBIC Blood Culture adequate volume   Culture   Final    NO GROWTH 5 DAYS Performed at [facility], [place] [address] [place], [place] [address]    Report Status 06/22/2021 FINAL  Final  Blood Culture (routine x 2)     Status: None   Collection Time: 06/17/21 11:44 AM   Specimen: BLOOD  Result Value Ref Range Status   Specimen Description BLOOD SITE NOT SPECIFIED  Final   Special Requests   Final    BOTTLES DRAWN AEROBIC AND ANAEROBIC Blood Culture adequate volume   Culture   Final    NO GROWTH 5 DAYS Performed at [facility], [place] [address], [place], [place] [address]    Report Status 06/22/2021 FINAL  Final  Resp Panel by RT-PCR (Flu A&B, Covid) Nasopharyngeal Swab     Status: Abnormal   Collection Time: 06/17/21  3:12 PM   Specimen: Nasopharyngeal Swab; Nasopharyngeal(NP) swabs in vial transport medium  Result Value Ref Range Status   SARS Coronavirus 2 by RT PCR POSITIVE (A) NEGATIVE Final    Comment: (NOTE) SARS-CoV-2 target nucleic acids are DETECTED.  The SARS-CoV-2 RNA is generally  detectable in upper respiratory specimens during the acute phase  of infection. Positive results are indicative of the presence of the identified virus, but do not rule out bacterial infection or co-infection with other pathogens not detected by the test. Clinical correlation with patient history and other diagnostic information is necessary to determine patient infection status. The expected result is Negative.  Fact Sheet for Patients: EntrepreneurPulse.com.au  Fact Sheet for Healthcare Providers: IncredibleEmployment.be  This test is not yet approved or cleared by the [place] FDA and  has been authorized for detection and/or diagnosis of SARS-CoV-2 by FDA under an Emergency Use Authorization (EUA).  This EUA will remain in effect (meaning this test can be used) for the duration of  the COVID-19 declaration under Section 564(b)(1) of the A ct, 21 U.S.C. section 360bbb-3(b)(1), unless the authorization is terminated or revoked sooner.     Influenza A by PCR NEGATIVE NEGATIVE Final   Influenza B by PCR NEGATIVE NEGATIVE Final    Comment: (NOTE) The Xpert Xpress SARS-CoV-2/FLU/RSV plus assay is intended as an aid in the diagnosis of influenza from Nasopharyngeal swab specimens and should not be used as a sole basis for treatment. Nasal washings and aspirates are unacceptable for Xpert Xpress SARS-CoV-2/FLU/RSV testing.  Fact Sheet for Patients: EntrepreneurPulse.com.au  Fact Sheet for Healthcare Providers: IncredibleEmployment.be  This test is not yet approved or cleared by the [place] FDA and has been authorized for detection and/or diagnosis of SARS-CoV-2 by FDA under an Emergency Use Authorization (EUA). This EUA will remain in effect (meaning this test can be used) for the duration of the COVID-19 declaration under Section 564(b)(1) of the Act, 21 U.S.C. section 360bbb-3(b)(1), unless the authorization is terminated or revoked.  Performed at [facility], [place] [address], [place], [place] [address]     RADIOLOGY STUDIES/RESULTS: ECHOCARDIOGRAM COMPLETE  Result Date: 06/21/2021    ECHOCARDIOGRAM REPORT   Patient Name:   Alexander Duran Date of Exam: 06/21/2021 Medical Rec #:  [id number]        Height:       66.0 in Accession #:    [id number]       Weight:       130.5 lb Date of Birth:  [birth year]        BSA:          1.668 m Patient Age:    61 years         BP:           141/87 mmHg Patient Gender: M                HR:           56 bpm. Exam Location:  Inpatient Procedure: 2D Echo, Cardiac Doppler and Color Doppler Indications:    Dyspnea  History:        Patient has no prior history of Echocardiogram examinations.                 Signs/Symptoms:Chest Pain; Risk Factors:Hypertension.  Sonographer:    Glo Herring Referring Phys: [place]  1. Left ventricular ejection fraction, by estimation, is 55 to 60%. The left ventricle has normal function. The left ventricular internal cavity size was mildly dilated. Left ventricular diastolic parameters were normal.  2. Right ventricular systolic function is low normal. The right ventricular size is mildly enlarged.  3. Right atrial size was severely dilated.  4. Soft tissue attenuation in pericardium consistent with probable  epicardial fat.Marland Kitchen a small pericardial effusion is present. The pericardial effusion is circumferential. Moderate pleural effusion.  5. The mitral valve is normal in structure. Trivial mitral valve regurgitation.  6. The aortic valve is tricuspid. Aortic valve regurgitation is not visualized. Aortic valve sclerosis is present, with no evidence of aortic valve stenosis.  7. The inferior vena cava is dilated in size with <50% respiratory variability, suggesting right atrial pressure of 15 mmHg. FINDINGS  Left Ventricle: Left ventricular ejection fraction, by estimation, is 55 to 60%. The left ventricle has normal function. The left ventricular internal cavity size was  mildly dilated. There is no left ventricular hypertrophy. Left ventricular diastolic parameters were normal. Right Ventricle: The right ventricular size is mildly enlarged. Right vetricular wall thickness was not assessed. Right ventricular systolic function is low normal. Left Atrium: Left atrial size was normal in size. Right Atrium: Right atrial size was severely dilated. Pericardium: Soft tissue attenuation in pericardium consistent with probable epicardial fat. A small pericardial effusion is present. The pericardial effusion is circumferential. Mitral Valve: The mitral valve is normal in structure. Trivial mitral valve regurgitation. Tricuspid Valve: The tricuspid valve is normal in structure. Tricuspid valve regurgitation is trivial. Aortic Valve: The aortic valve is tricuspid. Aortic valve regurgitation is not visualized. Aortic valve sclerosis is present, with no evidence of aortic valve stenosis. Aortic valve mean gradient measures 4.0 mmHg. Aortic valve peak gradient measures 6.8  mmHg. Aortic valve area, by VTI measures 1.78 cm. Pulmonic Valve: The pulmonic valve was not well visualized. Pulmonic valve regurgitation is not visualized. Aorta: The aortic root and ascending aorta are structurally normal, with no evidence of dilitation. Venous: The inferior vena cava is dilated in size with less than 50% respiratory variability, suggesting right atrial pressure of 15 mmHg. IAS/Shunts: No atrial level shunt detected by color flow Doppler. Additional Comments: There is a moderate pleural effusion.  LEFT VENTRICLE PLAX 2D LVIDd:         5.30 cm   Diastology LVIDs:         4.00 cm   LV e' medial:    8.05 cm/s LV PW:         1.00 cm   LV E/e' medial:  11.8 LV IVS:        1.00 cm   LV e' lateral:   11.10 cm/s LVOT diam:     2.00 cm   LV E/e' lateral: 8.5 LV SV:         63 LV SV Index:   38 LVOT Area:     3.14 cm  RIGHT VENTRICLE             IVC RV Basal diam:  4.80 cm     IVC diam: 2.20 cm RV Mid diam:    3.10  cm RV S prime:     12.10 cm/s LEFT ATRIUM         Index       RIGHT ATRIUM           Index LA diam:    3.60 cm 2.16 cm/m  RA Area:     29.60 cm                                 RA Volume:   114.00 ml 68.34 ml/m  AORTIC VALVE AV Area (Vmax):    1.75 cm AV Area (Vmean):   1.71 cm AV Area (VTI):  1.78 cm AV Vmax:           130.00 cm/s AV Vmean:          89.600 cm/s AV VTI:            0.357 m AV Peak Grad:      6.8 mmHg AV Mean Grad:      4.0 mmHg LVOT Vmax:         72.50 cm/s LVOT Vmean:        48.700 cm/s LVOT VTI:          0.202 m LVOT/AV VTI ratio: 0.57  AORTA Ao Root diam: 3.10 cm Ao Asc diam:  3.10 cm MITRAL VALVE MV Area (PHT): 3.89 cm    SHUNTS MV Decel Time: 195 msec    Systemic VTI:  0.20 m MV E velocity: 94.80 cm/s  Systemic Diam: 2.00 cm MV A velocity: 47.20 cm/s MV E/A ratio:  2.01 Dorris Carnes MD Electronically signed by Dorris Carnes MD Signature Date/Time: 06/21/2021/2:56:28 PM    Final      LOS: 5 days   Oren Binet, MD  [facility]    To contact the attending provider between 7A-7P or the covering provider during after hours 7P-7A, please log into the web site [email or web address] and access using universal [place] password for that web site. If you do not have the password, please call the hospital operator.  06/22/2021, 11:39 AM

## 2021-06-22 NOTE — TOC Progression Note (Signed)
Transition of Care [facility]) - Progression Note    Patient Details  Name: SEITH AIKEY MRN: [id number] Date of Birth: 1945/08/18  Transition [facility]) CM/SW [place], RN Phone Number:[phone]  06/22/2021, 12:38 PM  Clinical Narrative:    No current needs at this time. TOC will continue to follow. Awaiting PT/OT evaluations to determine home needs.    Expected Discharge Plan: Home/Self Care Barriers to Discharge: Continued Medical Work up  Expected Discharge Plan and Services Expected Discharge Plan: Home/Self Care In-house Referral: NA Discharge Planning Services: CM Consult Post Acute Care Choice: NA Living arrangements for the past 2 months: Single Family Home                 DME Arranged: N/A DME Agency: NA       HH Arranged: NA HH Agency: NA         Social Determinants of Health (SDOH) Interventions    Readmission Risk Interventions No flowsheet data found.

## 2021-06-22 NOTE — Progress Notes (Signed)
Patient ID: QUINTEZ MASELLI, male   DOB: 1945/10/03, 76 y.o.   MRN: [id number] [facility] Surgery Progress Note  1 Day Post-Op  Subjective: CC-  Having a lot of pain today. States that he does have abdominal pain but he also hurts all over. Some of this pain was present preop. Morphine helped a little but did not last long. Denies n/v. Unsure if he is passing flatus, no BM.   Objective: Vital signs in last 24 hours: Temp:  [97.4 F (36.3 C)-98.1 F (36.7 C)] 98.1 F (36.7 C) (01/06 0328) Pulse Rate:  [51-111] 53 (01/06 0805) Resp:  [10-18] 15 (01/06 0805) BP: (116-145)/(70-84) 145/71 (01/06 0805) SpO2:  [89 %-100 %] 97 % (01/06 0805) Last BM Date:  (pta)  Intake/Output from previous day: 01/05 0701 - 01/06 0700 In: 3143.5 [P.O.:480; I.V.:1665.4; IV Piggyback:998.2] Out: 525 [Urine:520; Blood:5] Intake/Output this shift: No intake/output data recorded.  PE: Gen:  Alert, NAD Abd: soft, nondistended, appropriately tender around G tube and over incisions, lap incisions cdi, +BS, G tube is clamped  Lab Results:  Recent Labs    06/21/21 0203 06/22/21 0148  WBC 5.9 5.5  HGB 10.7* 10.6*  HCT 31.2* 31.5*  PLT 252 240   BMET Recent Labs    06/21/21 0203 06/22/21 0148  NA 136 138  K 3.7 4.5  CL 105 103  CO2 26 29  GLUCOSE 82 89  BUN 18 13  CREATININE 0.61 0.54*  CALCIUM 8.0* 7.8*   PT/INR No results for input(s): LABPROT, INR in the last 72 hours. CMP     Component Value Date/Time   NA 138 06/22/2021 0148   NA 125 (L) 08/10/2015 1449   K 4.5 06/22/2021 0148   K 3.3 (L) 08/10/2015 1449   CL 103 06/22/2021 0148   CO2 29 06/22/2021 0148   CO2 28 08/10/2015 1449   GLUCOSE 89 06/22/2021 0148   GLUCOSE 103 08/10/2015 1449   BUN 13 06/22/2021 0148   BUN 13.0 08/10/2015 1449   CREATININE 0.54 (L) 06/22/2021 0148   CREATININE 0.8 08/10/2015 1449   CALCIUM 7.8 (L) 06/22/2021 0148   CALCIUM 9.0 08/10/2015 1449   PROT 5.3 (L) 06/22/2021 0148   PROT 7.5  08/10/2015 1449   ALBUMIN 2.2 (L) 06/22/2021 0148   ALBUMIN 4.1 08/10/2015 1449   AST 22 06/22/2021 0148   AST 55 (H) 08/10/2015 1449   ALT 13 06/22/2021 0148   ALT 28 08/10/2015 1449   ALKPHOS 40 06/22/2021 0148   ALKPHOS 59 08/10/2015 1449   BILITOT 0.5 06/22/2021 0148   BILITOT 0.63 08/10/2015 1449   GFRNONAA >60 06/22/2021 0148   GFRAA >60 10/28/2019 0248   Lipase     Component Value Date/Time   LIPASE 25 07/19/2012 0812       Studies/Results: ECHOCARDIOGRAM COMPLETE  Result Date: 06/21/2021    ECHOCARDIOGRAM REPORT   Patient Name:   JUNAID WURZER Date of Exam: 06/21/2021 Medical Rec #:  [id number]        Height:       66.0 in Accession #:    [id number]       Weight:       130.5 lb Date of Birth:  1945/09/08        BSA:          1.668 m Patient Age:    64 years         BP:           141/87 mmHg  Patient Gender: M                HR:           56 bpm. Exam Location:  Inpatient Procedure: 2D Echo, Cardiac Doppler and Color Doppler Indications:    Dyspnea  History:        Patient has no prior history of Echocardiogram examinations.                 Signs/Symptoms:Chest Pain; Risk Factors:Hypertension.  Sonographer:    Glo Herring Referring Phys: [place]  1. Left ventricular ejection fraction, by estimation, is 55 to 60%. The left ventricle has normal function. The left ventricular internal cavity size was mildly dilated. Left ventricular diastolic parameters were normal.  2. Right ventricular systolic function is low normal. The right ventricular size is mildly enlarged.  3. Right atrial size was severely dilated.  4. Soft tissue attenuation in pericardium consistent with probable epicardial fat.Marland Kitchen a small pericardial effusion is present. The pericardial effusion is circumferential. Moderate pleural effusion.  5. The mitral valve is normal in structure. Trivial mitral valve regurgitation.  6. The aortic valve is tricuspid. Aortic valve regurgitation is not  visualized. Aortic valve sclerosis is present, with no evidence of aortic valve stenosis.  7. The inferior vena cava is dilated in size with <50% respiratory variability, suggesting right atrial pressure of 15 mmHg. FINDINGS  Left Ventricle: Left ventricular ejection fraction, by estimation, is 55 to 60%. The left ventricle has normal function. The left ventricular internal cavity size was mildly dilated. There is no left ventricular hypertrophy. Left ventricular diastolic parameters were normal. Right Ventricle: The right ventricular size is mildly enlarged. Right vetricular wall thickness was not assessed. Right ventricular systolic function is low normal. Left Atrium: Left atrial size was normal in size. Right Atrium: Right atrial size was severely dilated. Pericardium: Soft tissue attenuation in pericardium consistent with probable epicardial fat. A small pericardial effusion is present. The pericardial effusion is circumferential. Mitral Valve: The mitral valve is normal in structure. Trivial mitral valve regurgitation. Tricuspid Valve: The tricuspid valve is normal in structure. Tricuspid valve regurgitation is trivial. Aortic Valve: The aortic valve is tricuspid. Aortic valve regurgitation is not visualized. Aortic valve sclerosis is present, with no evidence of aortic valve stenosis. Aortic valve mean gradient measures 4.0 mmHg. Aortic valve peak gradient measures 6.8  mmHg. Aortic valve area, by VTI measures 1.78 cm. Pulmonic Valve: The pulmonic valve was not well visualized. Pulmonic valve regurgitation is not visualized. Aorta: The aortic root and ascending aorta are structurally normal, with no evidence of dilitation. Venous: The inferior vena cava is dilated in size with less than 50% respiratory variability, suggesting right atrial pressure of 15 mmHg. IAS/Shunts: No atrial level shunt detected by color flow Doppler. Additional Comments: There is a moderate pleural effusion.  LEFT VENTRICLE PLAX 2D  LVIDd:         5.30 cm   Diastology LVIDs:         4.00 cm   LV e' medial:    8.05 cm/s LV PW:         1.00 cm   LV E/e' medial:  11.8 LV IVS:        1.00 cm   LV e' lateral:   11.10 cm/s LVOT diam:     2.00 cm   LV E/e' lateral: 8.5 LV SV:         63 LV SV Index:  38 LVOT Area:     3.14 cm  RIGHT VENTRICLE             IVC RV Basal diam:  4.80 cm     IVC diam: 2.20 cm RV Mid diam:    3.10 cm RV S prime:     12.10 cm/s LEFT ATRIUM         Index       RIGHT ATRIUM           Index LA diam:    3.60 cm 2.16 cm/m  RA Area:     29.60 cm                                 RA Volume:   114.00 ml 68.34 ml/m  AORTIC VALVE AV Area (Vmax):    1.75 cm AV Area (Vmean):   1.71 cm AV Area (VTI):     1.78 cm AV Vmax:           130.00 cm/s AV Vmean:          89.600 cm/s AV VTI:            0.357 m AV Peak Grad:      6.8 mmHg AV Mean Grad:      4.0 mmHg LVOT Vmax:         72.50 cm/s LVOT Vmean:        48.700 cm/s LVOT VTI:          0.202 m LVOT/AV VTI ratio: 0.57  AORTA Ao Root diam: 3.10 cm Ao Asc diam:  3.10 cm MITRAL VALVE MV Area (PHT): 3.89 cm    SHUNTS MV Decel Time: 195 msec    Systemic VTI:  0.20 m MV E velocity: 94.80 cm/s  Systemic Diam: 2.00 cm MV A velocity: 47.20 cm/s MV E/A ratio:  2.01 Dorris Carnes MD Electronically signed by Dorris Carnes MD Signature Date/Time: 06/21/2021/2:56:28 PM    Final     Anti-infectives: Anti-infectives (From admission, onward)    Start     Dose/Rate Route Frequency Ordered Stop   06/18/21 1000  remdesivir 100 mg in sodium chloride 0.9 % 100 mL IVPB       See Hyperspace for full Linked Orders Report.   100 mg 200 mL/hr over 30 Minutes Intravenous Daily 06/17/21 1923 06/21/21 1119   06/18/21 0700  cefTRIAXone (ROCEPHIN) 2 g in sodium chloride 0.9 % 100 mL IVPB  Status:  Discontinued        2 g 200 mL/hr over 30 Minutes Intravenous Every 24 hours 06/17/21 1548 06/17/21 1916   06/18/21 0700  azithromycin (ZITHROMAX) 500 mg in sodium chloride 0.9 % 250 mL IVPB  Status:  Discontinued         500 mg 250 mL/hr over 60 Minutes Intravenous Every 24 hours 06/17/21 1548 06/17/21 1916   06/17/21 2200  remdesivir 200 mg in sodium chloride 0.9% 250 mL IVPB       See Hyperspace for full Linked Orders Report.   200 mg 580 mL/hr over 30 Minutes Intravenous Once 06/17/21 1923 06/18/21 0058   06/17/21 2000  Ampicillin-Sulbactam (UNASYN) 3 g in sodium chloride 0.9 % 100 mL IVPB        3 g 200 mL/hr over 30 Minutes Intravenous Every 6 hours 06/17/21 1946     06/17/21 1515  cefTRIAXone (ROCEPHIN) 1 g in sodium chloride 0.9 % 100 mL IVPB  1 g 200 mL/hr over 30 Minutes Intravenous  Once 06/17/21 1502 06/17/21 1630   06/17/21 1515  azithromycin (ZITHROMAX) 500 mg in sodium chloride 0.9 % 250 mL IVPB        500 mg 250 mL/hr over 60 Minutes Intravenous  Once 06/17/21 1502 06/17/21 1731        Assessment/Plan 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 Kae Heller - Ok to start tube feedings. Continue abdominal binder. Pain medications ordered per G tube. Nylon sutures can come out around POD#10 - I will make an appointment in our office. We will sign off, please call with questions or concerns.   ID - currently unasyn and remdesivir VTE - lovenox FEN - IVF, NPO, initiate TFs Foley - none   LLL pneumonia - on unasyn COVID-19 - on remdesivir and prednisone Severe malnutrition  Carotid stenosis HTN Hypothyroidism OSA Code status DNR   LOS: 5 days    Wellington Hampshire, [facility] Surgery 06/22/2021, 10:12 AM Please see Amion for pager number during day hours 7:00am-4:30pm

## 2021-06-22 NOTE — Progress Notes (Signed)
OT Cancellation Note  Patient Details Name: Alexander Duran MRN: [id number] DOB: September 14, 1945   Cancelled Treatment:    Reason Eval/Treat Not Completed: Pain limiting ability to participate. Patient c/o continued abdominal pain and fatigue. OT to check back as time allows.   Gloris Manchester OTR/L Supplemental OT, Department of rehab services [phone]  Madhuri Vacca R H. 06/22/2021, 12:22 PM

## 2021-06-22 NOTE — Progress Notes (Signed)
PT Cancellation Note  Patient Details Name: Alexander Duran MRN: [id number] DOB: 10-25-1945   Cancelled Treatment:    Reason Eval/Treat Not Completed: Other (comment).  Declined PT this afternoon again, will retry tomorrow.   Ramond Dial 06/22/2021, 4:55 PM  Mee Hives, PT PhD Acute Rehab Dept. Number: [place] and [place]

## 2021-06-22 NOTE — Progress Notes (Signed)
Nutrition Follow-up  DOCUMENTATION CODES:   Severe malnutrition in context of chronic illness  INTERVENTION:   Initiate tube feeds via G-tube: Start Osmolite 1.5 @ 20 mL/hr and advance by 10 mL q8h until goal of 50 mL/hr is met (1200 mL/day) 45 mL ProSource TF - BID 150 mL free water flushes q4h   Provides 1880 kcal, 97 gm PRO, 914 mL free water (1814 mL total free water) daily Monitor magnesium, potassium, and phosphorus BID for at least 3 days. MD to replete as needed, as pt is at risk for refeeding syndrome given malnutrition and poor PO intake.  NUTRITION DIAGNOSIS:   Severe Malnutrition related to chronic illness (Cancer, Dysphagia) as evidenced by severe muscle depletion, severe fat depletion. - Ongoing  GOAL:   Patient will meet greater than or equal to 90% of their needs - Ongoing  MONITOR:   TF tolerance, Weight trends, Labs  REASON FOR ASSESSMENT:   Consult Assessment of nutrition requirement/status, Enteral/tube feeding initiation and management  ASSESSMENT:   76 y.o. male presented to the ED with SOB, cough, and chest pain. PMH includes tonsillar cancer - now in remission, cricopharyngeal bar, HTN, severe dysphagia, and hypothyroidism. Pt admitted with pneumonia, possible PEG placement, and COVID-19.   01/05 - G-Tube placement  RD received consult to start trickle tube feeds and advance as tolerated.   RD sent RN a secure chat to discuss starting TF via G-tube.   RD will continue to follow and monitor TF for tolerance.   Medications reviewed and include: Colace, Protonix, IV antibiotics Labs reviewed.  Diet Order:   Diet Order             Diet NPO time specified  Diet effective now                   EDUCATION NEEDS:   No education needs have been identified at this time  Skin:  Skin Assessment: Reviewed RN Assessment  Last BM:  Prior to Admission  Height:   Ht Readings from Last 1 Encounters:  06/18/21 _0  (1.676 m)    Weight:    Wt Readings from Last 1 Encounters:  06/18/21 59.2 kg    Ideal Body Weight:  64.6 kg  BMI:  Body mass index is 21.07 kg/m.  Estimated Nutritional Needs:   Kcal:  1800-2000  Protein:  90-105 grams  Fluid:  >/= 1.8 L    Harmon Bommarito Louie Casa, RD, LDN Clinical Dietitian See [facility] for contact information.

## 2021-06-22 NOTE — Progress Notes (Signed)
PT Cancellation Note  Patient Details Name: Alexander Duran MRN: [id number] DOB: Aug 07, 1945   Cancelled Treatment:    Reason Eval/Treat Not Completed: Other (comment).  Pt is too fatigued and having abd pain to perform therapy, and will retry as time and pt allow.         Ramond Dial 06/22/2021, 4:39 PM  Mee Hives, PT PhD Acute Rehab Dept. Number: [place] and [place]

## 2021-06-23 DIAGNOSIS — J690 Pneumonitis due to inhalation of food and vomit: Secondary | ICD-10-CM | POA: Diagnosis not present

## 2021-06-23 DIAGNOSIS — I10 Essential (primary) hypertension: Secondary | ICD-10-CM | POA: Diagnosis not present

## 2021-06-23 DIAGNOSIS — R1310 Dysphagia, unspecified: Secondary | ICD-10-CM | POA: Diagnosis not present

## 2021-06-23 DIAGNOSIS — U071 COVID-19: Secondary | ICD-10-CM | POA: Diagnosis not present

## 2021-06-23 LAB — GLUCOSE, CAPILLARY
Glucose-Capillary: 105 mg/dL — ABNORMAL HIGH (ref 70–99)
Glucose-Capillary: 110 mg/dL — ABNORMAL HIGH (ref 70–99)
Glucose-Capillary: 122 mg/dL — ABNORMAL HIGH (ref 70–99)
Glucose-Capillary: 126 mg/dL — ABNORMAL HIGH (ref 70–99)
Glucose-Capillary: 130 mg/dL — ABNORMAL HIGH (ref 70–99)
Glucose-Capillary: 136 mg/dL — ABNORMAL HIGH (ref 70–99)

## 2021-06-23 NOTE — Progress Notes (Signed)
Answered pt call bell; pt reported that he needs something for constipation and that daily one of the medications either IV or through his feeding tube causes some left side numbness.  Let pt RN know so she could notify MD and address.

## 2021-06-23 NOTE — Progress Notes (Signed)
Urine                        PROGRESS NOTE        PATIENT DETAILS Name: Alexander Duran Age: 76 y.o. Sex: male Date of Birth: 1945-08-18 Admit Date: 06/17/2021 Admitting Physician Karmen Bongo, MD PXT:GGYIR, Mikeal Hawthorne, MD  Brief Narrative: Patient is a 76 y.o. 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  See below for further details.  Subjective: Pain at the G-tube site has decreased-tolerating initiation of tube feeds.  No other issues overnight.  Objective: Vitals: Blood pressure 133/76, pulse (!) 57, temperature 98 F (36.7 C), temperature source Oral, resp. rate 17, height 5\' 6"  (1.676 m), weight 61.9 kg, SpO2 96 %.   Exam: Gen Exam:Alert awake-not in any distress HEENT:atraumatic, normocephalic Chest: B/L clear to auscultation anteriorly CVS:S1S2 regular Abdomen:soft non tender, non distended Extremities:no edema Neurology: Non focal Skin: no rash   Pertinent Labs/Radiology: Recent Labs  Lab 06/18/21 0500 06/18/21 1558 06/19/21 0108 06/20/21 0150 06/21/21 0203 06/22/21 0148  WBC 5.3  --  5.9 6.8 5.9 5.5  HGB 9.7*  --  9.8* 10.4* 10.7* 10.6*  PLT 250  --  263 250 252 240  NA 127*   < > 132* 137 136 138  K 4.1   < > 3.8 3.7 3.7 4.5  CREATININE 0.56*   < > 0.57* 0.67 0.61 0.54*  AST 28  --  23 22 20 22   ALT 17  --  15 15 16 13   ALKPHOS 47  --  41 43 43 40  BILITOT 0.5  --  0.9 0.8 0.8 0.5   < > = values in this interval not displayed.     Assessment/Plan: Aspiration pneumonia: Improved-afebrile-no leukocytosis-not short of breath-has completed a course of Unasyn.    Progressive oropharyngeal dysphagia: Likely sequelae of prior tonsillar cancer with radical neck dissection/radiation-worsened  further due to COVID-19 infection.  Evaluated by general surgery-s/p laparoscopic PEG tube insertion on 1/5.  Started on tube feeds on 1/6-seems to be tolerating well.    COVID-19 infection: Likely incidental finding-suspect pneumonia is from aspiration-has completed a course of Remdesivir.   History of carotid artery disease: Continue aspirin/statin  HTN: BP stable-continue Norvasc  History of tonsil cancer-s/p radical neck dissection/radiation: Currently in remission-resume outpatient follow-up with oncology.  Hypoalbuminemia with third spacing: Supportive care for now.  UA negative for proteinuria-echo with stable EF.  Nutrition Status: Nutrition Problem: Severe Malnutrition Etiology: chronic illness (Cancer, Dysphagia) Signs/Symptoms: severe muscle depletion, severe fat depletion Interventions: Tube feeding, Prostat  BMI Estimated body mass index is 22.03 kg/m as calculated from the following:   Height as of this encounter: 5\' 6"  (1.676 m).   Weight as of this encounter: 61.9 kg.   Procedures: None Consults: General surgery, IR DVT Prophylaxis: SQ Lovenox Code Status:DNR Family Communication: None at bedside  Time spent: 25 minutes-Greater than 50% of this time was spent in counseling, explanation of diagnosis, planning of further management, and coordination of care.   Disposition Plan: Status is: Inpatient  Remains inpatient appropriate because: Aspiration PNA-on IV antibiotics-incidental COVID-19 infection on IV Remdesivir-severe dysphagia-n.p.o.-needs PEG tube placement   Diet: Diet Order             Diet NPO  time specified  Diet effective now                     Antimicrobial agents: Anti-infectives (From admission, onward)    Start     Dose/Rate Route Frequency Ordered Stop   06/18/21 1000  remdesivir 100 mg in sodium chloride 0.9 % 100 mL IVPB       See Hyperspace for full Linked Orders Report.   100 mg 200 mL/hr over 30 Minutes Intravenous Daily  06/17/21 1923 06/21/21 1119   06/18/21 0700  cefTRIAXone (ROCEPHIN) 2 g in sodium chloride 0.9 % 100 mL IVPB  Status:  Discontinued        2 g 200 mL/hr over 30 Minutes Intravenous Every 24 hours 06/17/21 1548 06/17/21 1916   06/18/21 0700  azithromycin (ZITHROMAX) 500 mg in sodium chloride 0.9 % 250 mL IVPB  Status:  Discontinued        500 mg 250 mL/hr over 60 Minutes Intravenous Every 24 hours 06/17/21 1548 06/17/21 1916   06/17/21 2200  remdesivir 200 mg in sodium chloride 0.9% 250 mL IVPB       See Hyperspace for full Linked Orders Report.   200 mg 580 mL/hr over 30 Minutes Intravenous Once 06/17/21 1923 06/18/21 0058   06/17/21 2000  Ampicillin-Sulbactam (UNASYN) 3 g in sodium chloride 0.9 % 100 mL IVPB        3 g 200 mL/hr over 30 Minutes Intravenous Every 6 hours 06/17/21 1946 06/23/21 0914   06/17/21 1515  cefTRIAXone (ROCEPHIN) 1 g in sodium chloride 0.9 % 100 mL IVPB        1 g 200 mL/hr over 30 Minutes Intravenous  Once 06/17/21 1502 06/17/21 1630   06/17/21 1515  azithromycin (ZITHROMAX) 500 mg in sodium chloride 0.9 % 250 mL IVPB        500 mg 250 mL/hr over 60 Minutes Intravenous  Once 06/17/21 1502 06/17/21 1731        MEDICATIONS: Scheduled Meds:  albuterol  2 puff Inhalation Q6H   aspirin  81 mg Per Tube QPM   atorvastatin  20 mg Per Tube Daily   docusate  100 mg Per Tube BID   enoxaparin (LOVENOX) injection  40 mg Subcutaneous Q24H   feeding supplement (PROSource TF)  45 mL Per Tube BID   levothyroxine  75 mcg Per Tube QAC breakfast   midodrine  5 mg Per Tube TID WC   pantoprazole (PROTONIX) IV  40 mg Intravenous Q24H   sodium chloride flush  3 mL Intravenous Q12H   Continuous Infusions:  sodium chloride 10 mL/hr at 06/22/21 1142   feeding supplement (OSMOLITE 1.5 CAL) 1,000 mL (06/23/21 0522)   methocarbamol (ROBAXIN) IV     PRN Meds:.acetaminophen (TYLENOL) oral liquid 160 mg/5 mL, bisacodyl, chlorpheniramine-HYDROcodone, guaiFENesin-dextromethorphan,  hydrALAZINE, methocarbamol (ROBAXIN) IV, morphine injection, ondansetron **OR** ondansetron (ZOFRAN) IV, oxyCODONE, polyethylene glycol   I have personally reviewed following labs and imaging studies  LABORATORY DATA: CBC: Recent Labs  Lab 06/18/21 0500 06/19/21 0108 06/20/21 0150 06/21/21 0203 06/22/21 0148  WBC 5.3 5.9 6.8 5.9 5.5  NEUTROABS 5.0 5.3 5.9 5.0 4.7  HGB 9.7* 9.8* 10.4* 10.7* 10.6*  HCT 28.7* 28.4* 31.4* 31.2* 31.5*  MCV 90.8 90.4 91.8 92.0 93.2  PLT 250 263 250 252 240     Basic Metabolic Panel: Recent Labs  Lab 06/18/21 0500 06/18/21 1558 06/19/21 0108 06/20/21 0150 06/21/21 0203 06/22/21 0148  NA 127* 130* 132* 137 136 138  K 4.1 3.9 3.8 3.7 3.7 4.5  CL 93* 95* 98 103 105 103  CO2 25 25 25 27 26 29   GLUCOSE 69* 80 87 87 82 89  BUN 8 12 13 17 18 13   CREATININE 0.56* 0.63 0.57* 0.67 0.61 0.54*  CALCIUM 8.0* 8.1* 7.9* 8.1* 8.0* 7.8*  MG 1.7  --  1.9 2.1 2.2 2.1  PHOS 3.6  --  3.5 2.8 2.5 2.6     GFR: Estimated Creatinine Clearance: 72 mL/min (A) (by C-G formula based on SCr of 0.54 mg/dL (L)).  Liver Function Tests: Recent Labs  Lab 06/18/21 0500 06/19/21 0108 06/20/21 0150 06/21/21 0203 06/22/21 0148  AST 28 23 22 20 22   ALT 17 15 15 16 13   ALKPHOS 47 41 43 43 40  BILITOT 0.5 0.9 0.8 0.8 0.5  PROT 5.4* 5.1* 5.4* 5.4* 5.3*  ALBUMIN 2.3* 2.1* 2.2* 2.3* 2.2*    No results for input(s): LIPASE, AMYLASE in the last 168 hours. No results for input(s): AMMONIA in the last 168 hours.  Coagulation Profile: Recent Labs  Lab 06/17/21 1139  INR 1.0     Cardiac Enzymes: No results for input(s): CKTOTAL, CKMB, CKMBINDEX, TROPONINI in the last 168 hours.  BNP (last 3 results) No results for input(s): PROBNP in the last 8760 hours.  Lipid Profile: No results for input(s): CHOL, HDL, LDLCALC, TRIG, CHOLHDL, LDLDIRECT in the last 72 hours.  Thyroid Function Tests: No results for input(s): TSH, T4TOTAL, FREET4, T3FREE, THYROIDAB in the  last 72 hours.  Anemia Panel: No results for input(s): VITAMINB12, FOLATE, FERRITIN, TIBC, IRON, RETICCTPCT in the last 72 hours.   Urine analysis:    Component Value Date/Time   COLORURINE YELLOW 06/20/2021 2227   APPEARANCEUR HAZY (A) 06/20/2021 2227   LABSPEC 1.030 06/20/2021 2227   PHURINE 5.0 06/20/2021 2227   GLUCOSEU NEGATIVE 06/20/2021 2227   HGBUR NEGATIVE 06/20/2021 2227   BILIRUBINUR NEGATIVE 06/20/2021 2227   KETONESUR 20 (A) 06/20/2021 2227   PROTEINUR NEGATIVE 06/20/2021 2227   NITRITE NEGATIVE 06/20/2021 2227   LEUKOCYTESUR NEGATIVE 06/20/2021 2227    Sepsis Labs: Lactic Acid, Venous    Component Value Date/Time   LATICACIDVEN 1.3 06/17/2021 1139    MICROBIOLOGY: Recent Results (from the past 240 hour(s))  Blood Culture (routine x 2)     Status: None   Collection Time: 06/17/21 11:39 AM   Specimen: BLOOD  Result Value Ref Range Status   Specimen Description BLOOD SITE NOT SPECIFIED  Final   Special Requests   Final    BOTTLES DRAWN AEROBIC AND ANAEROBIC Blood Culture adequate volume   Culture   Final    NO GROWTH 5 DAYS Performed at [facility], [address] [place], [place] [address]    Report Status 06/22/2021 FINAL  Final  Blood Culture (routine x 2)     Status: None   Collection Time: 06/17/21 11:44 AM   Specimen: BLOOD  Result Value Ref Range Status   Specimen Description BLOOD SITE NOT SPECIFIED  Final   Special Requests   Final    BOTTLES DRAWN AEROBIC AND ANAEROBIC Blood Culture adequate volume   Culture   Final    NO GROWTH 5 DAYS Performed at [facility], [place] [address], [place], [place] [address]    Report Status 06/22/2021 FINAL  Final  Resp Panel by RT-PCR (Flu A&B, Covid) Nasopharyngeal Swab     Status: Abnormal   Collection Time: 06/17/21  3:12 PM  Specimen: Nasopharyngeal Swab; Nasopharyngeal(NP) swabs in vial transport medium  Result Value Ref Range Status   SARS Coronavirus 2 by RT PCR POSITIVE (A) NEGATIVE  Final    Comment: (NOTE) SARS-CoV-2 target nucleic acids are DETECTED.  The SARS-CoV-2 RNA is generally detectable in upper respiratory specimens during the acute phase of infection. Positive results are indicative of the presence of the identified virus, but do not rule out bacterial infection or co-infection with other pathogens not detected by the test. Clinical correlation with patient history and other diagnostic information is necessary to determine patient infection status. The expected result is Negative.  Fact Sheet for Patients: EntrepreneurPulse.com.au  Fact Sheet for Healthcare Providers: IncredibleEmployment.be  This test is not yet approved or cleared by the [place] FDA and  has been authorized for detection and/or diagnosis of SARS-CoV-2 by FDA under an Emergency Use Authorization (EUA).  This EUA will remain in effect (meaning this test can be used) for the duration of  the COVID-19 declaration under Section 564(b)(1) of the A ct, 21 U.S.C. section 360bbb-3(b)(1), unless the authorization is terminated or revoked sooner.     Influenza A by PCR NEGATIVE NEGATIVE Final   Influenza B by PCR NEGATIVE NEGATIVE Final    Comment: (NOTE) The Xpert Xpress SARS-CoV-2/FLU/RSV plus assay is intended as an aid in the diagnosis of influenza from Nasopharyngeal swab specimens and should not be used as a sole basis for treatment. Nasal washings and aspirates are unacceptable for Xpert Xpress SARS-CoV-2/FLU/RSV testing.  Fact Sheet for Patients: EntrepreneurPulse.com.au  Fact Sheet for Healthcare Providers: IncredibleEmployment.be  This test is not yet approved or cleared by the [place] FDA and has been authorized for detection and/or diagnosis of SARS-CoV-2 by FDA under an Emergency Use Authorization (EUA). This EUA will remain in effect (meaning this test can be used) for the duration of  the COVID-19 declaration under Section 564(b)(1) of the Act, 21 U.S.C. section 360bbb-3(b)(1), unless the authorization is terminated or revoked.  Performed at [facility], [place] [address] [place], [place] [address]     RADIOLOGY STUDIES/RESULTS: No results found.   LOS: 6 days   Oren Binet, MD  [facility]    To contact the attending provider between 7A-7P or the covering provider during after hours 7P-7A, please log into the web site [email or web address] and access using universal [place] password for that web site. If you do not have the password, please call the hospital operator.  06/23/2021, 11:00 AM

## 2021-06-23 NOTE — Evaluation (Signed)
Physical Therapy Evaluation Patient Details Name: Alexander Duran MRN: [id number] DOB: 08/19/1945 Today's Date: 06/23/2021  History of Present Illness  76 y.o. male presenting to ED 1/1 with SOB. Patient admitted with LLL pneumonia secondary to severe acute vs chroinic aspiration. S/p PEG placement 1/5. 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  Clinical Impression   Pt admitted secondary to problem above with deficits below. PTA patient was walking independently without a device. Pt currently requires minguard assist with RW on room air with sats 91%.  Anticipate patient will benefit from PT to address problems listed below.Will continue to follow acutely to maximize functional mobility independence and safety.          Recommendations for follow up therapy are one component of a multi-disciplinary discharge planning process, led by the attending physician.  Recommendations may be updated based on patient status, additional functional criteria and insurance authorization.  Follow Up Recommendations Home health PT    Assistance Recommended at Discharge PRN  Patient can return home with the following  A little help with walking and/or transfers;Help with stairs or ramp for entrance    Equipment Recommendations None recommended by PT  Recommendations for Other Services       Functional Status Assessment Patient has had a recent decline in their functional status and demonstrates the ability to make significant improvements in function in a reasonable and predictable amount of time.     Precautions / Restrictions Precautions Precautions: Fall Restrictions Weight Bearing Restrictions: No      Mobility  Bed Mobility Overal bed mobility: Needs Assistance Bed Mobility: Rolling;Sidelying to Sit Rolling: Supervision Sidelying to  sit: Min assist     Sit to sidelying: Min assist General bed mobility comments: Min A to elevate trunkand manage BLE    Transfers Overall transfer level: Needs assistance Equipment used: Rolling walker (2 wheels) Transfers: Sit to/from Stand Sit to Stand: Min assist           General transfer comment: Min A to power up and steady    Ambulation/Gait Ambulation/Gait assistance: Min guard Gait Distance (Feet): 90 Feet Assistive device: Rolling walker (2 wheels) Gait Pattern/deviations: Step-through pattern;Decreased step length - right;Decreased step length - left;Trunk flexed       General Gait Details: ambulating on RA with sats 91%  Stairs            Wheelchair Mobility    Modified Rankin (Stroke Patients Only)       Balance Overall balance assessment: Needs assistance Sitting-balance support: Single extremity supported Sitting balance-Leahy Scale: Fair     Standing balance support: Bilateral upper extremity supported;Reliant on assistive device for balance Standing balance-Leahy Scale: Poor                               Pertinent Vitals/Pain Pain Assessment: Faces Faces Pain Scale: Hurts little more Pain Location: Abdomen Pain Descriptors / Indicators: Aching;Discomfort;Grimacing Pain Intervention(s): Limited activity within patient's tolerance    Home Living Family/patient expects to be discharged to:: Private residence Living Arrangements: Spouse/significant other Available Help at Discharge: Family;Available 24 hours/day Type of Home: House Home Access: Stairs to enter Entrance Stairs-Rails: None Entrance Stairs-Number of Steps: 4 steps   Home Layout: One level Home Equipment: Conservation officer, nature (2 wheels);BSC/3in1      Prior Function Prior Level of Function : Independent/Modified  Independent             Mobility Comments: Reports ambulating with no DME ADLs Comments: Reports indep     Hand Dominance   Dominant Hand:  Right    Extremity/Trunk Assessment   Upper Extremity Assessment Upper Extremity Assessment: Defer to OT evaluation    Lower Extremity Assessment Lower Extremity Assessment: Generalized weakness    Cervical / Trunk Assessment Cervical / Trunk Assessment: Kyphotic  Communication   Communication: No difficulties  Cognition Arousal/Alertness: Awake/alert Behavior During Therapy: WFL for tasks assessed/performed Overall Cognitive Status: Within Functional Limits for tasks assessed                                          General Comments General comments (skin integrity, edema, etc.): 98% on 2L, On RA pt resting approximately 92%, on 1L 94%. RW aware pt left on 1L    Exercises     Assessment/Plan    PT Assessment Patient needs continued PT services  PT Problem List Decreased strength;Decreased balance;Decreased mobility;Decreased knowledge of use of DME;Cardiopulmonary status limiting activity       PT Treatment Interventions DME instruction;Gait training;Stair training;Functional mobility training;Therapeutic activities;Therapeutic exercise;Balance training;Patient/family education    PT Goals (Current goals can be found in the Care Plan section)  Acute Rehab PT Goals Patient Stated Goal: to get better PT Goal Formulation: With patient Time For Goal Achievement: 07/07/21 Potential to Achieve Goals: Good    Frequency Min 3X/week     Co-evaluation               AM-PAC PT "6 Clicks" Mobility  Outcome Measure Help needed turning from your back to your side while in a flat bed without using bedrails?: A Little Help needed moving from lying on your back to sitting on the side of a flat bed without using bedrails?: A Little Help needed moving to and from a bed to a chair (including a wheelchair)?: A Little Help needed standing up from a chair using your arms (e.g., wheelchair or bedside chair)?: A Little Help needed to walk in hospital room?: A  Little Help needed climbing 3-5 steps with a railing? : A Little 6 Click Score: 18    End of Session Equipment Utilized During Treatment: Gait belt Activity Tolerance: Patient tolerated treatment well Patient left: in chair;with call bell/phone within reach;with chair alarm set Nurse Communication: Mobility status PT Visit Diagnosis: Unsteadiness on feet (R26.81);Difficulty in walking, not elsewhere classified (R26.2)    Time: 7619-5093 PT Time Calculation (min) (ACUTE ONLY): 29 min   Charges:   PT Evaluation $PT Eval Moderate Complexity: 1 Mod PT Treatments $Gait Training: 8-22 mins         Arby Barrette, PT Acute Rehabilitation Services  Pager [phone] Office [phone]   Rexanne Mano 06/23/2021, 4:20 PM

## 2021-06-23 NOTE — Evaluation (Signed)
Occupational Therapy Evaluation Patient Details Name: Alexander Duran MRN: [id number] DOB: 15-Nov-1945 Today's Date: 06/23/2021   History of Present Illness 76 y.o. male presenting to ED 1/1 with SOB. Patient admitted with LLL pneumonia secondary to severe acute vs chroinic aspiration. S/p PEG placement 1/5. 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   Clinical Impression   Pt admitted for concerns listed above. PTA Pt reported that he was independent with all ADL's and IADL's, including yard work and working. At this time, pt requiring increased support due to weakness, pain, and balance deficits. He requires mod A for all LB ADL's due to abdominal pain and min A for functional mobility due to increased weakness. Recommending [facility] therapy services to maximize his independence and safety once home. OT will follow acutely.      Recommendations for follow up therapy are one component of a multi-disciplinary discharge planning process, led by the attending physician.  Recommendations may be updated based on patient status, additional functional criteria and insurance authorization.   Follow Up Recommendations  Home health OT    Assistance Recommended at Discharge Intermittent Supervision/Assistance  Patient can return home with the following A little help with walking and/or transfers;A lot of help with bathing/dressing/bathroom;Assistance with cooking/housework;Help with stairs or ramp for entrance    Functional Status Assessment  Patient has had a recent decline in their functional status and demonstrates the ability to make significant improvements in function in a reasonable and predictable amount of time.  Equipment Recommendations  None recommended by OT    Recommendations for Other Services       Precautions / Restrictions  Precautions Precautions: Fall Restrictions Weight Bearing Restrictions: No      Mobility Bed Mobility Overal bed mobility: Needs Assistance Bed Mobility: Rolling;Sidelying to Sit;Sit to Sidelying Rolling: Min guard Sidelying to sit: Min assist     Sit to sidelying: Min assist General bed mobility comments: Min A to elevate trunkand manage BLE    Transfers Overall transfer level: Needs assistance Equipment used: Rolling walker (2 wheels) Transfers: Sit to/from Stand Sit to Stand: Min assist           General transfer comment: Min A to power up and steay      Balance Overall balance assessment: Needs assistance Sitting-balance support: Single extremity supported Sitting balance-Leahy Scale: Fair     Standing balance support: Bilateral upper extremity supported;Reliant on assistive device for balance Standing balance-Leahy Scale: Poor                             ADL either performed or assessed with clinical judgement   ADL Overall ADL's : Needs assistance/impaired Eating/Feeding: Set up;Sitting   Grooming: Set up;Sitting   Upper Body Bathing: Min guard;Sitting   Lower Body Bathing: Moderate assistance;Sitting/lateral leans;Sit to/from stand   Upper Body Dressing : Min guard;Sitting   Lower Body Dressing: Moderate assistance;Sitting/lateral leans;Sit to/from stand   Toilet Transfer: Minimal assistance;Ambulation   Toileting- Clothing Manipulation and Hygiene: Minimal assistance;Sit to/from stand;Sitting/lateral lean       Functional mobility during ADLs: Minimal assistance;Rolling walker (2 wheels) General ADL Comments: Limited due to weakness and pain, requiring increased assist     Vision Baseline Vision/History: 1 Wears glasses Ability to See in Adequate Light: 0 Adequate Patient Visual Report: No change from baseline Vision Assessment?: No apparent visual  deficits     Perception     Praxis      Pertinent Vitals/Pain Pain  Assessment: Faces Faces Pain Scale: Hurts little more Pain Location: Abdomen Pain Descriptors / Indicators: Aching;Discomfort;Grimacing Pain Intervention(s): Monitored during session;Limited activity within patient's tolerance;Repositioned     Hand Dominance Right   Extremity/Trunk Assessment Upper Extremity Assessment Upper Extremity Assessment: Generalized weakness   Lower Extremity Assessment Lower Extremity Assessment: Defer to PT evaluation   Cervical / Trunk Assessment Cervical / Trunk Assessment: Kyphotic   Communication Communication Communication: No difficulties   Cognition Arousal/Alertness: Awake/alert Behavior During Therapy: WFL for tasks assessed/performed Overall Cognitive Status: Within Functional Limits for tasks assessed                                       General Comments  98% on 2L, On RA pt resting approximately 92%, on 1L 94%. RW aware pt left on 1L    Exercises     Shoulder Instructions      Home Living Family/patient expects to be discharged to:: Private residence Living Arrangements: Spouse/significant other Available Help at Discharge: Family;Available 24 hours/day Type of Home: House Home Access: Stairs to enter [employer] of Steps: 4 steps Entrance Stairs-Rails: None Home Layout: One level     Bathroom Shower/Tub: Teacher, early years/pre: Standard Bathroom Accessibility: Yes How Accessible: Accessible via walker Home Equipment: Conservation officer, nature (2 wheels);BSC/3in1          Prior Functioning/Environment Prior Level of Function : Independent/Modified Independent             Mobility Comments: Reports ambulating with no DME ADLs Comments: Reports indep        OT Problem List: Decreased strength;Decreased range of motion;Decreased activity tolerance;Impaired balance (sitting and/or standing);Decreased knowledge of use of DME or AE;Cardiopulmonary status limiting activity;Pain       OT Treatment/Interventions: Self-care/ADL training;Therapeutic exercise;Energy conservation;DME and/or AE instruction;Therapeutic activities;Patient/family education;Balance training    OT Goals(Current goals can be found in the care plan section) Acute Rehab OT Goals Patient Stated Goal: To get some strength back OT Goal Formulation: With patient Time For Goal Achievement: 07/07/21 Potential to Achieve Goals: Good ADL Goals Pt Will Perform Grooming: with modified independence;standing Pt Will Perform Lower Body Bathing: with modified independence;sitting/lateral leans;sit to/from stand Pt Will Perform Lower Body Dressing: with modified independence;sitting/lateral leans;sit to/from stand Pt Will Transfer to Toilet: with modified independence;ambulating Pt Will Perform Toileting - Clothing Manipulation and hygiene: with modified independence;sitting/lateral leans;sit to/from stand  OT Frequency: Min 2X/week    Co-evaluation              AM-PAC OT "6 Clicks" Daily Activity     Outcome Measure Help from another person eating meals?: A Little Help from another person taking care of personal grooming?: A Little Help from another person toileting, which includes using toliet, bedpan, or urinal?: A Little Help from another person bathing (including washing, rinsing, drying)?: A Lot Help from another person to put on and taking off regular upper body clothing?: A Little Help from another person to put on and taking off regular lower body clothing?: A Lot 6 Click Score: 16   End of Session Equipment Utilized During Treatment: Gait belt;Rolling walker (2 wheels);Oxygen Nurse Communication: Mobility status  Activity Tolerance: Patient limited by fatigue Patient left: in bed;with call bell/phone within reach;with bed alarm set  OT Visit Diagnosis:  Unsteadiness on feet (R26.81);Other abnormalities of gait and mobility (R26.89);Muscle weakness (generalized) (M62.81)                Time:  5003-7048 OT Time Calculation (min): 43 min Charges:  OT General Charges $OT Visit: 1 Visit OT Evaluation $OT Eval Moderate Complexity: 1 Mod OT Treatments $Self Care/Home Management : 8-22 mins $Therapeutic Activity: 8-22 mins  Cortni Tays H., OTR/L Acute Rehabilitation  Ellarie Picking Elane Kerina [place]oneau 06/23/2021, 2:01 PM

## 2021-06-24 DIAGNOSIS — U071 COVID-19: Secondary | ICD-10-CM | POA: Diagnosis not present

## 2021-06-24 DIAGNOSIS — I10 Essential (primary) hypertension: Secondary | ICD-10-CM | POA: Diagnosis not present

## 2021-06-24 DIAGNOSIS — R1310 Dysphagia, unspecified: Secondary | ICD-10-CM | POA: Diagnosis not present

## 2021-06-24 DIAGNOSIS — J690 Pneumonitis due to inhalation of food and vomit: Secondary | ICD-10-CM | POA: Diagnosis not present

## 2021-06-24 LAB — GLUCOSE, CAPILLARY
Glucose-Capillary: 105 mg/dL — ABNORMAL HIGH (ref 70–99)
Glucose-Capillary: 108 mg/dL — ABNORMAL HIGH (ref 70–99)
Glucose-Capillary: 111 mg/dL — ABNORMAL HIGH (ref 70–99)
Glucose-Capillary: 120 mg/dL — ABNORMAL HIGH (ref 70–99)
Glucose-Capillary: 154 mg/dL — ABNORMAL HIGH (ref 70–99)
Glucose-Capillary: 58 mg/dL — ABNORMAL LOW (ref 70–99)
Glucose-Capillary: 85 mg/dL (ref 70–99)

## 2021-06-24 MED ORDER — DEXTROSE 50 % IV SOLN
INTRAVENOUS | Status: AC
  Filled 2021-06-24: qty 50

## 2021-06-24 MED ORDER — CHLORHEXIDINE GLUCONATE 0.12 % MT SOLN
15.0000 mL | Freq: Two times a day (BID) | OROMUCOSAL | Status: DC
  Administered 2021-06-24 – 2021-06-27 (×7): 15 mL via OROMUCOSAL
  Filled 2021-06-24 (×7): qty 15

## 2021-06-24 MED ORDER — SALINE SPRAY 0.65 % NA SOLN
1.0000 | NASAL | Status: DC | PRN
  Administered 2021-06-24 – 2021-06-25 (×2): 1 via NASAL
  Filled 2021-06-24: qty 44

## 2021-06-24 MED ORDER — DEXTROSE-NACL 5-0.9 % IV SOLN: INTRAVENOUS | Status: DC

## 2021-06-24 MED ORDER — ORAL CARE MOUTH RINSE
15.0000 mL | Freq: Two times a day (BID) | OROMUCOSAL | Status: DC
  Administered 2021-06-24 – 2021-06-26 (×6): 15 mL via OROMUCOSAL

## 2021-06-24 MED ORDER — DEXTROSE 50 % IV SOLN
12.5000 g | INTRAVENOUS | Status: AC
  Administered 2021-06-24: 12.5 g via INTRAVENOUS

## 2021-06-24 NOTE — Progress Notes (Signed)
Urine                        PROGRESS NOTE        PATIENT DETAILS Name: Alexander Duran Age: 76 y.o. Sex: male Date of Birth: 12/27/1945 Admit Date: 06/17/2021 Admitting Physician Karmen Bongo, MD TOI:ZTIWP, Mikeal Hawthorne, MD  Brief Narrative: Patient is a 76 y.o. 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  See below for further details.  Subjective: Pain at the G-tube insertion site-mild hypoglycemic episode last night.  Anxious about discharge plans-explained that we will likely switch him to bolus regimen.  Objective: Vitals: Blood pressure 130/80, pulse 65, temperature 97.8 F (36.6 C), temperature source Oral, resp. rate 17, height 5\' 6"  (1.676 m), weight 60 kg, SpO2 94 %.   Exam: Gen Exam:Alert awake-not in any distress HEENT:atraumatic, normocephalic Chest: B/L clear to auscultation anteriorly CVS:S1S2 regular Abdomen:soft non tender, non distended Extremities:no edema Neurology: Non focal Skin: no rash   Pertinent Labs/Radiology: Recent Labs  Lab 06/18/21 0500 06/18/21 1558 06/19/21 0108 06/20/21 0150 06/21/21 0203 06/22/21 0148  WBC 5.3  --  5.9 6.8 5.9 5.5  HGB 9.7*  --  9.8* 10.4* 10.7* 10.6*  PLT 250  --  263 250 252 240  NA 127*   < > 132* 137 136 138  K 4.1   < > 3.8 3.7 3.7 4.5  CREATININE 0.56*   < > 0.57* 0.67 0.61 0.54*  AST 28  --  23 22 20 22   ALT 17  --  15 15 16 13   ALKPHOS 47  --  41 43 43 40  BILITOT 0.5  --  0.9 0.8 0.8 0.5   < > = values in this interval not displayed.     Assessment/Plan: Aspiration pneumonia: Improved-afebrile-no leukocytosis-not short of breath-has completed a course of Unasyn.    Progressive oropharyngeal dysphagia: Likely sequelae of prior tonsillar  cancer with radical neck dissection/radiation-worsened further due to COVID-19 infection.  Evaluated by general surgery-s/p laparoscopic PEG tube insertion on 1/5.  Started on tube feeds on 1/6-seems to be tolerating well.  Have reconsulted dietitian to see if we can get tube feeds switched to a bolus regimen so that it would be more convenient for him at home.  COVID-19 infection: Likely incidental finding-suspect pneumonia is from aspiration-has completed a course of Remdesivir.   Mild hypoglycemia: Likely due to insulin use-patient does not have a history of diabetes-had mild hyperglycemia related to feeds-stop insulin and see how he does.  History of carotid artery disease: Continue aspirin/statin  HTN: BP stable-antihypertensives on hold.  History of tonsil cancer-s/p radical neck dissection/radiation: Currently in remission-resume outpatient follow-up with oncology.  Hypoalbuminemia with third spacing: Supportive care for now.  UA negative for proteinuria-echo with stable EF.  Nutrition Status: Nutrition Problem: Severe Malnutrition Etiology: chronic illness (Cancer, Dysphagia) Signs/Symptoms: severe muscle depletion, severe fat depletion Interventions: Tube feeding, Prostat  BMI Estimated body mass index is 21.35 kg/m as calculated from the following:   Height as of this encounter: 5\' 6"  (1.676 m).   Weight as of this encounter: 60 kg.   Procedures: None Consults: General surgery, IR DVT Prophylaxis: SQ Lovenox Code Status:DNR Family Communication: None at bedside  Time spent: 25 minutes-Greater than 50% of this  time was spent in counseling, explanation of diagnosis, planning of further management, and coordination of care.   Disposition Plan: Status is: Inpatient  Remains inpatient appropriate because: Aspiration PNA-on IV antibiotics-incidental COVID-19 infection on IV Remdesivir-severe dysphagia-n.p.o.-needs PEG tube placement   Diet: Diet Order             Diet  NPO time specified  Diet effective now                     Antimicrobial agents: Anti-infectives (From admission, onward)    Start     Dose/Rate Route Frequency Ordered Stop   06/18/21 1000  remdesivir 100 mg in sodium chloride 0.9 % 100 mL IVPB       See Hyperspace for full Linked Orders Report.   100 mg 200 mL/hr over 30 Minutes Intravenous Daily 06/17/21 1923 06/21/21 1119   06/18/21 0700  cefTRIAXone (ROCEPHIN) 2 g in sodium chloride 0.9 % 100 mL IVPB  Status:  Discontinued        2 g 200 mL/hr over 30 Minutes Intravenous Every 24 hours 06/17/21 1548 06/17/21 1916   06/18/21 0700  azithromycin (ZITHROMAX) 500 mg in sodium chloride 0.9 % 250 mL IVPB  Status:  Discontinued        500 mg 250 mL/hr over 60 Minutes Intravenous Every 24 hours 06/17/21 1548 06/17/21 1916   06/17/21 2200  remdesivir 200 mg in sodium chloride 0.9% 250 mL IVPB       See Hyperspace for full Linked Orders Report.   200 mg 580 mL/hr over 30 Minutes Intravenous Once 06/17/21 1923 06/18/21 0058   06/17/21 2000  Ampicillin-Sulbactam (UNASYN) 3 g in sodium chloride 0.9 % 100 mL IVPB        3 g 200 mL/hr over 30 Minutes Intravenous Every 6 hours 06/17/21 1946 06/23/21 1224   06/17/21 1515  cefTRIAXone (ROCEPHIN) 1 g in sodium chloride 0.9 % 100 mL IVPB        1 g 200 mL/hr over 30 Minutes Intravenous  Once 06/17/21 1502 06/17/21 1630   06/17/21 1515  azithromycin (ZITHROMAX) 500 mg in sodium chloride 0.9 % 250 mL IVPB        500 mg 250 mL/hr over 60 Minutes Intravenous  Once 06/17/21 1502 06/17/21 1731        MEDICATIONS: Scheduled Meds:  albuterol  2 puff Inhalation Q6H   aspirin  81 mg Per Tube QPM   atorvastatin  20 mg Per Tube Daily   chlorhexidine  15 mL Mouth Rinse BID   docusate  100 mg Per Tube BID   enoxaparin (LOVENOX) injection  40 mg Subcutaneous Q24H   feeding supplement (PROSource TF)  45 mL Per Tube BID   levothyroxine  75 mcg Per Tube QAC breakfast   mouth rinse  15 mL Mouth  Rinse q12n4p   midodrine  5 mg Per Tube TID WC   pantoprazole (PROTONIX) IV  40 mg Intravenous Q24H   sodium chloride flush  3 mL Intravenous Q12H   Continuous Infusions:  sodium chloride 10 mL/hr at 06/22/21 1142   dextrose 5 % and 0.9% NaCl 50 mL/hr at 06/24/21 0412   feeding supplement (OSMOLITE 1.5 CAL) 50 mL/hr at 06/23/21 1336   methocarbamol (ROBAXIN) IV     PRN Meds:.acetaminophen (TYLENOL) oral liquid 160 mg/5 mL, bisacodyl, chlorpheniramine-HYDROcodone, guaiFENesin-dextromethorphan, hydrALAZINE, methocarbamol (ROBAXIN) IV, morphine injection, ondansetron **OR** ondansetron (ZOFRAN) IV, oxyCODONE, polyethylene glycol   I have personally reviewed following labs and  imaging studies  LABORATORY DATA: CBC: Recent Labs  Lab 06/18/21 0500 06/19/21 0108 06/20/21 0150 06/21/21 0203 06/22/21 0148  WBC 5.3 5.9 6.8 5.9 5.5  NEUTROABS 5.0 5.3 5.9 5.0 4.7  HGB 9.7* 9.8* 10.4* 10.7* 10.6*  HCT 28.7* 28.4* 31.4* 31.2* 31.5*  MCV 90.8 90.4 91.8 92.0 93.2  PLT 250 263 250 252 240     Basic Metabolic Panel: Recent Labs  Lab 06/18/21 0500 06/18/21 1558 06/19/21 0108 06/20/21 0150 06/21/21 0203 06/22/21 0148  NA 127* 130* 132* 137 136 138  K 4.1 3.9 3.8 3.7 3.7 4.5  CL 93* 95* 98 103 105 103  CO2 25 25 25 27 26 29   GLUCOSE 69* 80 87 87 82 89  BUN 8 12 13 17 18 13   CREATININE 0.56* 0.63 0.57* 0.67 0.61 0.54*  CALCIUM 8.0* 8.1* 7.9* 8.1* 8.0* 7.8*  MG 1.7  --  1.9 2.1 2.2 2.1  PHOS 3.6  --  3.5 2.8 2.5 2.6     GFR: Estimated Creatinine Clearance: 69.8 mL/min (A) (by C-G formula based on SCr of 0.54 mg/dL (L)).  Liver Function Tests: Recent Labs  Lab 06/18/21 0500 06/19/21 0108 06/20/21 0150 06/21/21 0203 06/22/21 0148  AST 28 23 22 20 22   ALT 17 15 15 16 13   ALKPHOS 47 41 43 43 40  BILITOT 0.5 0.9 0.8 0.8 0.5  PROT 5.4* 5.1* 5.4* 5.4* 5.3*  ALBUMIN 2.3* 2.1* 2.2* 2.3* 2.2*    No results for input(s): LIPASE, AMYLASE in the last 168 hours. No results for  input(s): AMMONIA in the last 168 hours.  Coagulation Profile: Recent Labs  Lab 06/17/21 1139  INR 1.0     Cardiac Enzymes: No results for input(s): CKTOTAL, CKMB, CKMBINDEX, TROPONINI in the last 168 hours.  BNP (last 3 results) No results for input(s): PROBNP in the last 8760 hours.  Lipid Profile: No results for input(s): CHOL, HDL, LDLCALC, TRIG, CHOLHDL, LDLDIRECT in the last 72 hours.  Thyroid Function Tests: No results for input(s): TSH, T4TOTAL, FREET4, T3FREE, THYROIDAB in the last 72 hours.  Anemia Panel: No results for input(s): VITAMINB12, FOLATE, FERRITIN, TIBC, IRON, RETICCTPCT in the last 72 hours.   Urine analysis:    Component Value Date/Time   COLORURINE YELLOW 06/20/2021 2227   APPEARANCEUR HAZY (A) 06/20/2021 2227   LABSPEC 1.030 06/20/2021 2227   PHURINE 5.0 06/20/2021 2227   GLUCOSEU NEGATIVE 06/20/2021 2227   HGBUR NEGATIVE 06/20/2021 2227   BILIRUBINUR NEGATIVE 06/20/2021 2227   KETONESUR 20 (A) 06/20/2021 2227   PROTEINUR NEGATIVE 06/20/2021 2227   NITRITE NEGATIVE 06/20/2021 2227   LEUKOCYTESUR NEGATIVE 06/20/2021 2227    Sepsis Labs: Lactic Acid, Venous    Component Value Date/Time   LATICACIDVEN 1.3 06/17/2021 1139    MICROBIOLOGY: Recent Results (from the past 240 hour(s))  Blood Culture (routine x 2)     Status: None   Collection Time: 06/17/21 11:39 AM   Specimen: BLOOD  Result Value Ref Range Status   Specimen Description BLOOD SITE NOT SPECIFIED  Final   Special Requests   Final    BOTTLES DRAWN AEROBIC AND ANAEROBIC Blood Culture adequate volume   Culture   Final    NO GROWTH 5 DAYS Performed at [facility], [address], [place], [place] [address]    Report Status 06/22/2021 FINAL  Final  Blood Culture (routine x 2)     Status: None   Collection Time: 06/17/21 11:44 AM   Specimen: BLOOD  Result Value Ref Range Status   Specimen Description BLOOD SITE NOT SPECIFIED  Final   Special Requests   Final     BOTTLES DRAWN AEROBIC AND ANAEROBIC Blood Culture adequate volume   Culture   Final    NO GROWTH 5 DAYS Performed at [facility], [address], [place], [place] [address]    Report Status 06/22/2021 FINAL  Final  Resp Panel by RT-PCR (Flu A&B, Covid) Nasopharyngeal Swab     Status: Abnormal   Collection Time: 06/17/21  3:12 PM   Specimen: Nasopharyngeal Swab; Nasopharyngeal(NP) swabs in vial transport medium  Result Value Ref Range Status   SARS Coronavirus 2 by RT PCR POSITIVE (A) NEGATIVE Final    Comment: (NOTE) SARS-CoV-2 target nucleic acids are DETECTED.  The SARS-CoV-2 RNA is generally detectable in upper respiratory specimens during the acute phase of infection. Positive results are indicative of the presence of the identified virus, but do not rule out bacterial infection or co-infection with other pathogens not detected by the test. Clinical correlation with patient history and other diagnostic information is necessary to determine patient infection status. The expected result is Negative.  Fact Sheet for Patients: EntrepreneurPulse.com.au  Fact Sheet for Healthcare Providers: IncredibleEmployment.be  This test is not yet approved or cleared by the [place] FDA and  has been authorized for detection and/or diagnosis of SARS-CoV-2 by FDA under an Emergency Use Authorization (EUA).  This EUA will remain in effect (meaning this test can be used) for the duration of  the COVID-19 declaration under Section 564(b)(1) of the A ct, 21 U.S.C. section 360bbb-3(b)(1), unless the authorization is terminated or revoked sooner.     Influenza A by PCR NEGATIVE NEGATIVE Final   Influenza B by PCR NEGATIVE NEGATIVE Final    Comment: (NOTE) The Xpert Xpress SARS-CoV-2/FLU/RSV plus assay is intended as an aid in the diagnosis of influenza from Nasopharyngeal swab specimens and should not be used as a sole basis for treatment. Nasal  washings and aspirates are unacceptable for Xpert Xpress SARS-CoV-2/FLU/RSV testing.  Fact Sheet for Patients: EntrepreneurPulse.com.au  Fact Sheet for Healthcare Providers: IncredibleEmployment.be  This test is not yet approved or cleared by the [place] FDA and has been authorized for detection and/or diagnosis of SARS-CoV-2 by FDA under an Emergency Use Authorization (EUA). This EUA will remain in effect (meaning this test can be used) for the duration of the COVID-19 declaration under Section 564(b)(1) of the Act, 21 U.S.C. section 360bbb-3(b)(1), unless the authorization is terminated or revoked.  Performed at [facility], [place] [address] [place], [place] [address]     RADIOLOGY STUDIES/RESULTS: No results found.   LOS: 7 days   Oren Binet, MD  [facility]    To contact the attending provider between 7A-7P or the covering provider during after hours 7P-7A, please log into the web site [email or web address] and access using universal [place] password for that web site. If you do not have the password, please call the hospital operator.  06/24/2021, 10:54 AM

## 2021-06-24 NOTE — Significant Event (Signed)
Hypoglycemic Event  CBG: 58  Treatment: D50 25 mL (12.5 gm)  Symptoms: None  Follow-up CBG: Time:0048 CBG Result:120  Possible Reasons for Event: Inadequate meal intake  Comments/MD notified: [place] Bridgett Larsson, DO notified. Ordered D5NS @ 1mL/hr IV.

## 2021-06-24 NOTE — TOC Initial Note (Signed)
Transition of Care [facility]) - Initial/Assessment Note    Patient Details  Name: Alexander Duran MRN: [id number] Date of Birth: 1945/10/13  Transition of Care [facility]) CM/SW Contact:    Carles Collet, RN Phone Number: 06/24/2021, 2:29 PM  Clinical Narrative:                 Spoke w patient over the phone due to [place]. Speech was slow however patient alert and oriented and provided detailed information r/t services PTA.  He follows at the [facility]. He gets scripts there or [facility] [address]. His PCP at the [place] is DR Windy Fast.  He lives at home w his wife, and also will have support from friends and neighbors. His brother checks in on them at home about once a day. He states that his wife may not be able to come in to learn feeds before he leaves, but he is going to contact his brother to come in and learn.   He is agreeable to [facility] services, would like a highly rated company. [facility] able to do same day as DC start, so referral placed to [facility] for PT OT SLP (possibly RN).   Patient needs 1- HH order with face to face and [facility] notified on day of DC 2- DME order for tube feeds and referral given to [facility]. 3- Sent home with three days worth of tube feeds for home use.    Expected Discharge Plan: [place] Barriers to Discharge: Continued Medical Work up   Patient Goals and CMS Choice Patient states their goals for this hospitalization and ongoing recovery are:: To get stronger and to get home CMS Medicare.gov Compare Post Acute Care list provided to:: Patient Choice offered to / list presented to : Patient  Expected Discharge Plan and Services Expected Discharge Plan: [place] In-house Referral: NA Discharge Planning Services: CM Consult Post Acute Care Choice: Home Health, Durable Medical Equipment Living arrangements for the past 2 months: Single Family Home                 DME Arranged: Tube  feeding DME Agency: NA       HH Arranged: PT, OT, Speech Therapy HH Agency: [place] Date [place]: 06/24/21 Time [place]: [id number] Representative spoke with at [place]: Amy  Prior Living Arrangements/Services Living arrangements for the past 2 months: [place] with:: Spouse Patient language and need for interpreter reviewed:: Yes Do you feel safe going back to the place where you live?: Yes      Need for Family Participation in Patient Care: Yes (Comment) Care giver support system in place?: Yes (comment)   Criminal Activity/Legal Involvement Pertinent to Current Situation/Hospitalization: No - Comment as needed  Activities of Daily Living   ADL Screening (condition at time of admission) Patient's cognitive ability adequate to safely complete daily activities?: Yes Is the patient deaf or have difficulty hearing?: No Does the patient have difficulty seeing, even when wearing glasses/contacts?: No Does the patient have difficulty concentrating, remembering, or making decisions?: No Patient able to express need for assistance with ADLs?: No Does the patient have difficulty dressing or bathing?: Yes Independently performs ADLs?: No Communication: Independent Dressing (OT): Needs assistance Is this a change from baseline?: Change from baseline, expected to last <3days Grooming: Needs assistance Is this a change from baseline?: Change from baseline, expected to last <3 days  Feeding: Independent Bathing: Needs assistance Is this a change from baseline?: Change from baseline, expected to last <3 days Toileting: Needs assistance In/Out Bed: Needs assistance Is this a change from baseline?: Change from baseline, expected to last <3 days Walks in Home: Independent Does the patient have difficulty walking or climbing stairs?: No Weakness of Legs: Both Weakness of Arms/Hands: None  Permission Sought/Granted   Permission granted to  share information with : No              Emotional Assessment Appearance::  (n/a patient in airborne precautions) Attitude/Demeanor/Rapport: Unable to Assess Affect (typically observed): Unable to Assess   Alcohol / Substance Use: Not Applicable Psych Involvement: No (comment)  Admission diagnosis:  Hyponatremia [E87.1] LLL pneumonia [J18.9] Dysphagia, unspecified type [R13.10] Community acquired pneumonia, unspecified laterality [J18.9] Patient Active Problem List   Diagnosis Date Noted   Protein-calorie malnutrition, severe 06/21/2021   LLL pneumonia 06/17/2021   DNR (do not resuscitate) 06/17/2021   COVID-19 virus infection 06/17/2021   Dysphagia 04/26/2021   Pulmonary infiltrates on CXR 04/26/2021   S/P left THA, AA 10/26/2019   Diaphoresis 11/22/2017   Shoulder pain 11/22/2017   Hyponatremia 11/22/2017   Chest pain 11/21/2017   Esophageal obstruction due to food impaction    Left carotid stenosis 08/02/2016   Carotid disease, bilateral ([place]) 09/14/2015   Carotid artery stenosis without cerebral infarction 08/30/2015   Other headache syndrome 08/10/2015   Nausea in adult 08/10/2015   Weight loss 08/08/2015   Hypothyroidism 05/09/2014   Essential hypertension 05/09/2014   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

## 2021-06-25 DIAGNOSIS — J189 Pneumonia, unspecified organism: Secondary | ICD-10-CM | POA: Diagnosis not present

## 2021-06-25 DIAGNOSIS — U071 COVID-19: Secondary | ICD-10-CM | POA: Diagnosis not present

## 2021-06-25 DIAGNOSIS — R1310 Dysphagia, unspecified: Secondary | ICD-10-CM | POA: Diagnosis not present

## 2021-06-25 DIAGNOSIS — J690 Pneumonitis due to inhalation of food and vomit: Secondary | ICD-10-CM | POA: Diagnosis not present

## 2021-06-25 LAB — GLUCOSE, CAPILLARY
Glucose-Capillary: 115 mg/dL — ABNORMAL HIGH (ref 70–99)
Glucose-Capillary: 126 mg/dL — ABNORMAL HIGH (ref 70–99)
Glucose-Capillary: 128 mg/dL — ABNORMAL HIGH (ref 70–99)
Glucose-Capillary: 156 mg/dL — ABNORMAL HIGH (ref 70–99)
Glucose-Capillary: 88 mg/dL (ref 70–99)

## 2021-06-25 MED ORDER — PANTOPRAZOLE 2 MG/ML SUSPENSION
40.0000 mg | Freq: Every day | ORAL | Status: DC
  Administered 2021-06-26 – 2021-06-27 (×2): 40 mg
  Filled 2021-06-25 (×2): qty 20

## 2021-06-25 MED ORDER — ACETAMINOPHEN 160 MG/5ML PO SOLN
1000.0000 mg | Freq: Three times a day (TID) | ORAL | Status: DC
  Administered 2021-06-25 – 2021-06-26 (×5): 1000 mg
  Filled 2021-06-25 (×5): qty 40.6

## 2021-06-25 MED ORDER — OXYMETAZOLINE HCL 0.05 % NA SOLN
1.0000 | Freq: Two times a day (BID) | NASAL | Status: DC
  Administered 2021-06-25 – 2021-06-26 (×3): 1 via NASAL
  Filled 2021-06-25: qty 30

## 2021-06-25 MED ORDER — OXYCODONE HCL 5 MG/5ML PO SOLN
5.0000 mg | ORAL | Status: DC | PRN
  Administered 2021-06-25 – 2021-06-26 (×2): 5 mg
  Filled 2021-06-25 (×2): qty 5

## 2021-06-25 MED ORDER — FLUTICASONE PROPIONATE 50 MCG/ACT NA SUSP
2.0000 | Freq: Every day | NASAL | Status: DC
  Administered 2021-06-26 – 2021-06-27 (×2): 2 via NASAL
  Filled 2021-06-25: qty 16

## 2021-06-25 MED ORDER — OSMOLITE 1.5 CAL PO LIQD
355.0000 mL | Freq: Four times a day (QID) | ORAL | Status: DC
  Administered 2021-06-25: 119 mL
  Administered 2021-06-25: 237 mL
  Administered 2021-06-26 – 2021-06-27 (×6): 355 mL
  Filled 2021-06-25 (×11): qty 474

## 2021-06-25 MED ORDER — LORATADINE 10 MG PO TABS
10.0000 mg | ORAL_TABLET | Freq: Every day | ORAL | Status: DC
  Administered 2021-06-25 – 2021-06-27 (×3): 10 mg
  Filled 2021-06-25 (×3): qty 1

## 2021-06-25 NOTE — Progress Notes (Signed)
OT Cancellation Note  Patient Details Name: Alexander Duran MRN: [id number] DOB: 17-Jul-1945   Cancelled Treatment:    Reason Eval/Treat Not Completed: Patient at procedure or test/ unavailable: Nurse in room giving bolus, asking for some time.   Julien Girt 06/25/2021, 2:25 PM

## 2021-06-25 NOTE — Progress Notes (Addendum)
Nutrition Follow-up  DOCUMENTATION CODES:  Severe malnutrition in context of chronic illness  INTERVENTION:  Change TF to bolus feedings QID: 1.5 cartons of Osmolite 1.5 QID at 0800, 1200, 1600, 2000 150 ml of free water q 4 hours  Provides 2130 kcal, 89 grams protein, and 1979 ml of free water daily.  NUTRITION DIAGNOSIS:  Severe Malnutrition related to chronic illness (Cancer, Dysphagia) as evidenced by severe muscle depletion, severe fat depletion. - ongoing  GOAL:  Patient will meet greater than or equal to 90% of their needs. - met with TF  MONITOR:  TF tolerance, Weight trends, Labs  REASON FOR ASSESSMENT:  Consult Assessment of nutrition requirement/status, Enteral/tube feeding initiation and management  ASSESSMENT:  76 y.o. male presented to the ED with SOB, cough, and chest pain. PMH includes tonsillar cancer - now in remission, cricopharyngeal bar, HTN, severe dysphagia, and hypothyroidism. Pt admitted with pneumonia, possible PEG placement, and COVID-19. 1/5 - G-tube placed  RD received consult to change TF regimen to bolus feedings.   Reached out to RN via secure chat regarding change in TF regimen. Updated admin instructions in order set.  Current TF Regimen: Osmolite 1.5 @ 50 mL/hr (1200 mL/day) 45 mL ProSource TF BID 150 mL free water flushes q 4 hrs    Provides 1880 kcal, 97 gm protein, 914 mL free water (1814 mL total free water) daily.  Admit wt: 59.2 kg Current wt: 60 kg  Pt with moderate BLE edema.  Medications: reviewed; colace BID, Synthroid, midodrine TID, Protonix  Labs: reviewed; CBG 88-128  Diet Order:   Diet Order             Diet NPO time specified  Diet effective now                  EDUCATION NEEDS:  No education needs have been identified at this time  Skin:  Skin Assessment: Reviewed RN Assessment  Last BM:  06/24/21 - Type 4, large  Height:  Ht Readings from Last 1 Encounters:  06/18/21 '5\' 6"'  (1.676 m)   Weight:   Wt Readings from Last 1 Encounters:  06/24/21 60 kg   BMI:  Body mass index is 21.35 kg/m.  Estimated Nutritional Needs:  Kcal:  1800-2000 Protein:  90-105 grams Fluid:  >/= 1.8 L  Derrel Nip, RD, LDN (she/her/hers) Clinical Inpatient Dietitian RD Pager/After-Hours/Weekend Pager # in [place]

## 2021-06-25 NOTE — Plan of Care (Signed)
°  Problem: Activity: Goal: Ability to tolerate increased activity will improve Outcome: Progressing   Problem: Clinical Measurements: Goal: Ability to maintain a body temperature in the normal range will improve Outcome: Progressing   Problem: Respiratory: Goal: Ability to maintain adequate ventilation will improve Outcome: Progressing Goal: Ability to maintain a clear airway will improve Outcome: Progressing   Problem: Education: Goal: Knowledge of General Education information will improve Description: Including pain rating scale, medication(s)/side effects and non-pharmacologic comfort measures Outcome: Progressing   Problem: Health Behavior/Discharge Planning: Goal: Ability to manage health-related needs will improve Outcome: Progressing   Problem: Clinical Measurements: Goal: Ability to maintain clinical measurements within normal limits will improve Outcome: Progressing Goal: Will remain free from infection Outcome: Progressing Goal: Diagnostic test results will improve Outcome: Progressing Goal: Respiratory complications will improve Outcome: Progressing Goal: Cardiovascular complication will be avoided Outcome: Progressing   Problem: Activity: Goal: Risk for activity intolerance will decrease Outcome: Progressing   Problem: Nutrition: Goal: Adequate nutrition will be maintained Outcome: Progressing   Problem: Coping: Goal: Level of anxiety will decrease Outcome: Progressing   Problem: Pain Managment: Goal: General experience of comfort will improve Outcome: Progressing   Problem: Safety: Goal: Ability to remain free from injury will improve Outcome: Progressing   Problem: Skin Integrity: Goal: Risk for impaired skin integrity will decrease Outcome: Progressing   

## 2021-06-25 NOTE — Progress Notes (Signed)
Urine                        PROGRESS NOTE        PATIENT DETAILS Name: Alexander Duran Age: 76 y.o. Sex: male Date of Birth: 1945/10/03 Admit Date: 06/17/2021 Admitting Physician Karmen Bongo, MD DXA:JOINO, Mikeal Hawthorne, MD  Brief Narrative: Patient is a 75 y.o. 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  See below for further details.  Subjective: Complains of some nasal congestion/rhinitis.  Continues to cough which worsens pain at his G-tube insertion site.  He wants to talk with general surgery.  Objective: Vitals: Blood pressure 135/77, pulse 68, temperature 98.3 F (36.8 C), temperature source Oral, resp. rate 20, height 5\' 6"  (1.676 m), weight 60 kg, SpO2 96 %.   Exam: Gen Exam:Alert awake-not in any distress HEENT:atraumatic, normocephalic Chest: B/L clear to auscultation anteriorly CVS:S1S2 regular Abdomen:soft non tender, non distended Extremities:no edema Neurology: Non focal Skin: no rash   Pertinent Labs/Radiology: Recent Labs  Lab 06/19/21 0108 06/20/21 0150 06/21/21 0203 06/22/21 0148  WBC 5.9 6.8 5.9 5.5  HGB 9.8* 10.4* 10.7* 10.6*  PLT 263 250 252 240  NA 132* 137 136 138  K 3.8 3.7 3.7 4.5  CREATININE 0.57* 0.67 0.61 0.54*  AST 23 22 20 22   ALT 15 15 16 13   ALKPHOS 41 43 43 40  BILITOT 0.9 0.8 0.8 0.5     Assessment/Plan: Aspiration pneumonia: Improved-afebrile-no leukocytosis-not short of breath-has completed a course of Unasyn.  Still continues to cough at times but otherwise stable.  Progressive oropharyngeal dysphagia: Likely sequelae of prior tonsillar cancer with radical neck dissection/radiation-worsened further due to COVID-19 infection.  Evaluated by general surgery-s/p  laparoscopic PEG tube insertion on 1/5.  Started on tube feeds on 1/6-seems to be tolerating well.  We will switch to bolus regimen-have asked nursing staff to start educating patient as to how to use bolus regimen at home.  COVID-19 infection: Likely incidental finding-suspect pneumonia is from aspiration-has completed a course of Remdesivir.   Mild hypoglycemia: Likely due to insulin use-patient does not have a history of diabetes-has resolved with supportive care.  Was on SSI due to mild hypoglycemia from tube feeds.  History of carotid artery disease: Continue aspirin/statin  HTN: BP stable-antihypertensives on hold.  History of tonsil cancer-s/p radical neck dissection/radiation: Currently in remission-resume outpatient follow-up with oncology.  Hypoalbuminemia with third spacing: Supportive care for now.  UA negative for proteinuria-echo with stable EF.  Nutrition Status: Nutrition Problem: Severe Malnutrition Etiology: chronic illness (Cancer, Dysphagia) Signs/Symptoms: severe muscle depletion, severe fat depletion Interventions: Tube feeding, Prostat  BMI Estimated body mass index is 21.35 kg/m as calculated from the following:   Height as of this encounter: 5\' 6"  (1.676 m).   Weight as of this encounter: 60 kg.   Procedures: None Consults: General surgery, IR DVT Prophylaxis: SQ Lovenox Code Status:DNR Family Communication: None at bedside  Time spent: 25 minutes-Greater than 50% of this time was spent in counseling, explanation of diagnosis, planning of further management, and coordination of care.   Disposition Plan: Status is: Inpatient  Remains inpatient appropriate because: Aspiration PNA-on IV antibiotics-incidental COVID-19 infection on IV Remdesivir-severe dysphagia-n.p.o.-needs PEG tube placement   Diet: Diet Order  Diet NPO time specified  Diet effective now                     Antimicrobial agents: Anti-infectives (From admission,  onward)    Start     Dose/Rate Route Frequency Ordered Stop   06/18/21 1000  remdesivir 100 mg in sodium chloride 0.9 % 100 mL IVPB       See Hyperspace for full Linked Orders Report.   100 mg 200 mL/hr over 30 Minutes Intravenous Daily 06/17/21 1923 06/21/21 1119   06/18/21 0700  cefTRIAXone (ROCEPHIN) 2 g in sodium chloride 0.9 % 100 mL IVPB  Status:  Discontinued        2 g 200 mL/hr over 30 Minutes Intravenous Every 24 hours 06/17/21 1548 06/17/21 1916   06/18/21 0700  azithromycin (ZITHROMAX) 500 mg in sodium chloride 0.9 % 250 mL IVPB  Status:  Discontinued        500 mg 250 mL/hr over 60 Minutes Intravenous Every 24 hours 06/17/21 1548 06/17/21 1916   06/17/21 2200  remdesivir 200 mg in sodium chloride 0.9% 250 mL IVPB       See Hyperspace for full Linked Orders Report.   200 mg 580 mL/hr over 30 Minutes Intravenous Once 06/17/21 1923 06/18/21 0058   06/17/21 2000  Ampicillin-Sulbactam (UNASYN) 3 g in sodium chloride 0.9 % 100 mL IVPB        3 g 200 mL/hr over 30 Minutes Intravenous Every 6 hours 06/17/21 1946 06/23/21 1224   06/17/21 1515  cefTRIAXone (ROCEPHIN) 1 g in sodium chloride 0.9 % 100 mL IVPB        1 g 200 mL/hr over 30 Minutes Intravenous  Once 06/17/21 1502 06/17/21 1630   06/17/21 1515  azithromycin (ZITHROMAX) 500 mg in sodium chloride 0.9 % 250 mL IVPB        500 mg 250 mL/hr over 60 Minutes Intravenous  Once 06/17/21 1502 06/17/21 1731        MEDICATIONS: Scheduled Meds:  acetaminophen (TYLENOL) oral liquid 160 mg/5 mL  1,000 mg Per Tube Q8H   albuterol  2 puff Inhalation Q6H   aspirin  81 mg Per Tube QPM   atorvastatin  20 mg Per Tube Daily   chlorhexidine  15 mL Mouth Rinse BID   docusate  100 mg Per Tube BID   enoxaparin (LOVENOX) injection  40 mg Subcutaneous Q24H   feeding supplement (OSMOLITE 1.5 CAL)  355 mL Per Tube QID   levothyroxine  75 mcg Per Tube QAC breakfast   mouth rinse  15 mL Mouth Rinse q12n4p   midodrine  5 mg Per Tube TID WC    [START ON 06/26/2021] pantoprazole sodium  40 mg Per Tube Daily   sodium chloride flush  3 mL Intravenous Q12H   Continuous Infusions:  sodium chloride 10 mL/hr at 06/22/21 1142   methocarbamol (ROBAXIN) IV     PRN Meds:.bisacodyl, chlorpheniramine-HYDROcodone, guaiFENesin-dextromethorphan, hydrALAZINE, methocarbamol (ROBAXIN) IV, morphine injection, ondansetron **OR** ondansetron (ZOFRAN) IV, oxyCODONE, polyethylene glycol, sodium chloride   I have personally reviewed following labs and imaging studies  LABORATORY DATA: CBC: Recent Labs  Lab 06/19/21 0108 06/20/21 0150 06/21/21 0203 06/22/21 0148  WBC 5.9 6.8 5.9 5.5  NEUTROABS 5.3 5.9 5.0 4.7  HGB 9.8* 10.4* 10.7* 10.6*  HCT 28.4* 31.4* 31.2* 31.5*  MCV 90.4 91.8 92.0 93.2  PLT 263 250 252 240     Basic Metabolic Panel: Recent Labs  Lab 06/18/21 1558 06/19/21 0108  06/20/21 0150 06/21/21 0203 06/22/21 0148  NA 130* 132* 137 136 138  K 3.9 3.8 3.7 3.7 4.5  CL 95* 98 103 105 103  CO2 25 25 27 26 29   GLUCOSE 80 87 87 82 89  BUN 12 13 17 18 13   CREATININE 0.63 0.57* 0.67 0.61 0.54*  CALCIUM 8.1* 7.9* 8.1* 8.0* 7.8*  MG  --  1.9 2.1 2.2 2.1  PHOS  --  3.5 2.8 2.5 2.6     GFR: Estimated Creatinine Clearance: 69.8 mL/min (A) (by C-G formula based on SCr of 0.54 mg/dL (L)).  Liver Function Tests: Recent Labs  Lab 06/19/21 0108 06/20/21 0150 06/21/21 0203 06/22/21 0148  AST 23 22 20 22   ALT 15 15 16 13   ALKPHOS 41 43 43 40  BILITOT 0.9 0.8 0.8 0.5  PROT 5.1* 5.4* 5.4* 5.3*  ALBUMIN 2.1* 2.2* 2.3* 2.2*    No results for input(s): LIPASE, AMYLASE in the last 168 hours. No results for input(s): AMMONIA in the last 168 hours.  Coagulation Profile: No results for input(s): INR, PROTIME in the last 168 hours.   Cardiac Enzymes: No results for input(s): CKTOTAL, CKMB, CKMBINDEX, TROPONINI in the last 168 hours.  BNP (last 3 results) No results for input(s): PROBNP in the last 8760 hours.  Lipid  Profile: No results for input(s): CHOL, HDL, LDLCALC, TRIG, CHOLHDL, LDLDIRECT in the last 72 hours.  Thyroid Function Tests: No results for input(s): TSH, T4TOTAL, FREET4, T3FREE, THYROIDAB in the last 72 hours.  Anemia Panel: No results for input(s): VITAMINB12, FOLATE, FERRITIN, TIBC, IRON, RETICCTPCT in the last 72 hours.   Urine analysis:    Component Value Date/Time   COLORURINE YELLOW 06/20/2021 2227   APPEARANCEUR HAZY (A) 06/20/2021 2227   LABSPEC 1.030 06/20/2021 2227   PHURINE 5.0 06/20/2021 2227   GLUCOSEU NEGATIVE 06/20/2021 2227   HGBUR NEGATIVE 06/20/2021 2227   BILIRUBINUR NEGATIVE 06/20/2021 2227   KETONESUR 20 (A) 06/20/2021 2227   PROTEINUR NEGATIVE 06/20/2021 2227   NITRITE NEGATIVE 06/20/2021 2227   LEUKOCYTESUR NEGATIVE 06/20/2021 2227    Sepsis Labs: Lactic Acid, Venous    Component Value Date/Time   LATICACIDVEN 1.3 06/17/2021 1139    MICROBIOLOGY: Recent Results (from the past 240 hour(s))  Blood Culture (routine x 2)     Status: None   Collection Time: 06/17/21 11:39 AM   Specimen: BLOOD  Result Value Ref Range Status   Specimen Description BLOOD SITE NOT SPECIFIED  Final   Special Requests   Final    BOTTLES DRAWN AEROBIC AND ANAEROBIC Blood Culture adequate volume   Culture   Final    NO GROWTH 5 DAYS Performed at [facility], [address] [place], [place] [address]    Report Status 06/22/2021 FINAL  Final  Blood Culture (routine x 2)     Status: None   Collection Time: 06/17/21 11:44 AM   Specimen: BLOOD  Result Value Ref Range Status   Specimen Description BLOOD SITE NOT SPECIFIED  Final   Special Requests   Final    BOTTLES DRAWN AEROBIC AND ANAEROBIC Blood Culture adequate volume   Culture   Final    NO GROWTH 5 DAYS Performed at [facility], [place] [address] [place], [place] [address]    Report Status 06/22/2021 FINAL  Final  Resp Panel by RT-PCR (Flu A&B, Covid) Nasopharyngeal Swab     Status: Abnormal    Collection Time: 06/17/21  3:12 PM  Specimen: Nasopharyngeal Swab; Nasopharyngeal(NP) swabs in vial transport medium  Result Value Ref Range Status   SARS Coronavirus 2 by RT PCR POSITIVE (A) NEGATIVE Final    Comment: (NOTE) SARS-CoV-2 target nucleic acids are DETECTED.  The SARS-CoV-2 RNA is generally detectable in upper respiratory specimens during the acute phase of infection. Positive results are indicative of the presence of the identified virus, but do not rule out bacterial infection or co-infection with other pathogens not detected by the test. Clinical correlation with patient history and other diagnostic information is necessary to determine patient infection status. The expected result is Negative.  Fact Sheet for Patients: EntrepreneurPulse.com.au  Fact Sheet for Healthcare Providers: IncredibleEmployment.be  This test is not yet approved or cleared by the [place] FDA and  has been authorized for detection and/or diagnosis of SARS-CoV-2 by FDA under an Emergency Use Authorization (EUA).  This EUA will remain in effect (meaning this test can be used) for the duration of  the COVID-19 declaration under Section 564(b)(1) of the A ct, 21 U.S.C. section 360bbb-3(b)(1), unless the authorization is terminated or revoked sooner.     Influenza A by PCR NEGATIVE NEGATIVE Final   Influenza B by PCR NEGATIVE NEGATIVE Final    Comment: (NOTE) The Xpert Xpress SARS-CoV-2/FLU/RSV plus assay is intended as an aid in the diagnosis of influenza from Nasopharyngeal swab specimens and should not be used as a sole basis for treatment. Nasal washings and aspirates are unacceptable for Xpert Xpress SARS-CoV-2/FLU/RSV testing.  Fact Sheet for Patients: EntrepreneurPulse.com.au  Fact Sheet for Healthcare Providers: IncredibleEmployment.be  This test is not yet approved or cleared by the [place] FDA  and has been authorized for detection and/or diagnosis of SARS-CoV-2 by FDA under an Emergency Use Authorization (EUA). This EUA will remain in effect (meaning this test can be used) for the duration of the COVID-19 declaration under Section 564(b)(1) of the Act, 21 U.S.C. section 360bbb-3(b)(1), unless the authorization is terminated or revoked.  Performed at [facility], [place] [address] [place], [place] [address]     RADIOLOGY STUDIES/RESULTS: No results found.   LOS: 8 days   Oren Binet, MD  [facility]    To contact the attending provider between 7A-7P or the covering provider during after hours 7P-7A, please log into the web site [email or web address] and access using universal [place] password for that web site. If you do not have the password, please call the hospital operator.  06/25/2021, 12:18 PM

## 2021-06-25 NOTE — Progress Notes (Addendum)
Patient ID: Alexander Duran, male   DOB: 06-Sep-1945, 76 y.o.   MRN: [id number] [facility] Surgery Progress Note  4 Days Post-Op  Subjective: Patient asked to see [place] again secondary to some abdominal pain with coughing but also about his abdominal binder as it is riding up over his chest and he feels it is restricting his breathing.  He is tolerating TFs well and they are starting to work on bolus feeds.  He is preparing to go home soon.  Objective: Vital signs in last 24 hours: Temp:  [98 F (36.7 C)-98.3 F (36.8 C)] 98.3 F (36.8 C) (01/09 1222) Pulse Rate:  [61-79] 61 (01/09 1222) Resp:  [16-20] 16 (01/09 1222) BP: (113-146)/(62-96) 119/77 (01/09 1222) SpO2:  [95 %-100 %] 100 % (01/09 1222) Last BM Date: 06/22/21  Intake/Output from previous day: 01/08 0701 - 01/09 0700 In: 1690.4 [I.V.:117.1; NG/GT:1393.3] Out: 750 [Urine:750] Intake/Output this shift: Total I/O In: -  Out: 450 [Urine:450]  PE: Gen:  Alert, NAD Abd: soft, nondistended, appropriately tender around G tube and over incisions, lap incisions cdi, +BS, G tube with tube feeds running.  No erythema or purulent drainage around g-tube, just some old dried up blood.  Lab Results:  No results for input(s): WBC, HGB, HCT, PLT in the last 72 hours.  BMET No results for input(s): NA, K, CL, CO2, GLUCOSE, BUN, CREATININE, CALCIUM in the last 72 hours.  PT/INR No results for input(s): LABPROT, INR in the last 72 hours. CMP     Component Value Date/Time   NA 138 06/22/2021 0148   NA 125 (L) 08/10/2015 1449   K 4.5 06/22/2021 0148   K 3.3 (L) 08/10/2015 1449   CL 103 06/22/2021 0148   CO2 29 06/22/2021 0148   CO2 28 08/10/2015 1449   GLUCOSE 89 06/22/2021 0148   GLUCOSE 103 08/10/2015 1449   BUN 13 06/22/2021 0148   BUN 13.0 08/10/2015 1449   CREATININE 0.54 (L) 06/22/2021 0148   CREATININE 0.8 08/10/2015 1449   CALCIUM 7.8 (L) 06/22/2021 0148   CALCIUM 9.0 08/10/2015 1449   PROT 5.3 (L) 06/22/2021  0148   PROT 7.5 08/10/2015 1449   ALBUMIN 2.2 (L) 06/22/2021 0148   ALBUMIN 4.1 08/10/2015 1449   AST 22 06/22/2021 0148   AST 55 (H) 08/10/2015 1449   ALT 13 06/22/2021 0148   ALT 28 08/10/2015 1449   ALKPHOS 40 06/22/2021 0148   ALKPHOS 59 08/10/2015 1449   BILITOT 0.5 06/22/2021 0148   BILITOT 0.63 08/10/2015 1449   GFRNONAA >60 06/22/2021 0148   GFRAA >60 10/28/2019 0248   Lipase     Component Value Date/Time   LIPASE 25 07/19/2012 0812       Studies/Results: No results found.  Anti-infectives: Anti-infectives (From admission, onward)    Start     Dose/Rate Route Frequency Ordered Stop   06/18/21 1000  remdesivir 100 mg in sodium chloride 0.9 % 100 mL IVPB       See Hyperspace for full Linked Orders Report.   100 mg 200 mL/hr over 30 Minutes Intravenous Daily 06/17/21 1923 06/21/21 1119   06/18/21 0700  cefTRIAXone (ROCEPHIN) 2 g in sodium chloride 0.9 % 100 mL IVPB  Status:  Discontinued        2 g 200 mL/hr over 30 Minutes Intravenous Every 24 hours 06/17/21 1548 06/17/21 1916   06/18/21 0700  azithromycin (ZITHROMAX) 500 mg in sodium chloride 0.9 % 250 mL IVPB  Status:  Discontinued  500 mg 250 mL/hr over 60 Minutes Intravenous Every 24 hours 06/17/21 1548 06/17/21 1916   06/17/21 2200  remdesivir 200 mg in sodium chloride 0.9% 250 mL IVPB       See Hyperspace for full Linked Orders Report.   200 mg 580 mL/hr over 30 Minutes Intravenous Once 06/17/21 1923 06/18/21 0058   06/17/21 2000  Ampicillin-Sulbactam (UNASYN) 3 g in sodium chloride 0.9 % 100 mL IVPB        3 g 200 mL/hr over 30 Minutes Intravenous Every 6 hours 06/17/21 1946 06/23/21 1224   06/17/21 1515  cefTRIAXone (ROCEPHIN) 1 g in sodium chloride 0.9 % 100 mL IVPB        1 g 200 mL/hr over 30 Minutes Intravenous  Once 06/17/21 1502 06/17/21 1630   06/17/21 1515  azithromycin (ZITHROMAX) 500 mg in sodium chloride 0.9 % 250 mL IVPB        500 mg 250 mL/hr over 60 Minutes Intravenous  Once  06/17/21 1502 06/17/21 1731        Assessment/Plan 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 Kae Heller - patient doing well and tolerating TFs well. -DC abdominal binder as he is not confused and pulling at it.  This is riding up around his chest and he feels like it is restricting his pulmonary status.   -he is aware he will have soreness with coughing and is grateful we were able to do this lap instead of open to help minimize his pain -he was grateful for me to stop by and we discussed his follow up for suture removal in our office on POD 10.   -he had no further questions   ID - none currently VTE - lovenox FEN - IVF, NPO, initiate TFs Foley - none   LLL pneumonia - on unasyn, completed COVID-19 - on remdesivir and prednisone, completed Severe malnutrition  Carotid stenosis HTN Hypothyroidism OSA Code status DNR   LOS: 8 days    Henreitta Cea, [facility] Surgery 06/25/2021, 1:47 PM Please see Amion for pager number during day hours 7:00am-4:30pm

## 2021-06-26 DIAGNOSIS — U071 COVID-19: Secondary | ICD-10-CM | POA: Diagnosis not present

## 2021-06-26 DIAGNOSIS — I10 Essential (primary) hypertension: Secondary | ICD-10-CM | POA: Diagnosis not present

## 2021-06-26 DIAGNOSIS — J690 Pneumonitis due to inhalation of food and vomit: Secondary | ICD-10-CM | POA: Diagnosis not present

## 2021-06-26 DIAGNOSIS — R1314 Dysphagia, pharyngoesophageal phase: Secondary | ICD-10-CM | POA: Diagnosis not present

## 2021-06-26 LAB — GLUCOSE, CAPILLARY
Glucose-Capillary: 82 mg/dL (ref 70–99)
Glucose-Capillary: 86 mg/dL (ref 70–99)
Glucose-Capillary: 147 mg/dL — ABNORMAL HIGH (ref 70–99)
Glucose-Capillary: 131 mg/dL — ABNORMAL HIGH (ref 70–99)
Glucose-Capillary: 67 mg/dL — ABNORMAL LOW (ref 70–99)
Glucose-Capillary: 70 mg/dL (ref 70–99)

## 2021-06-26 NOTE — Progress Notes (Signed)
Physical Therapy Treatment Patient Details Name: Alexander Duran MRN: [id number] DOB: March 16, 1946 Today's Date: 06/26/2021   History of Present Illness 76 y.o. male presenting to ED 1/1 with SOB. Patient admitted with LLL pneumonia secondary to severe acute vs chroinic aspiration. S/p PEG placement 1/5. 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    PT Comments    Patient eager to work with therapy and to go home tomorrow. Patient mobilizing better, but continues to require RW for ambulation. He was able to ambulate on room air with sats 88-89% x 125 ft.    Recommendations for follow up therapy are one component of a multi-disciplinary discharge planning process, led by the attending physician.  Recommendations may be updated based on patient status, additional functional criteria and insurance authorization.  Follow Up Recommendations  Home health PT     Assistance Recommended at Discharge PRN  Patient can return home with the following A little help with walking and/or transfers;Help with stairs or ramp for entrance   Equipment Recommendations  None recommended by PT    Recommendations for Other Services       Precautions / Restrictions Precautions Precautions: Fall     Mobility  Bed Mobility Overal bed mobility: Needs Assistance Bed Mobility: Rolling;Sidelying to Sit Rolling: Modified independent (Device/Increase time) Sidelying to sit: Modified independent (Device/Increase time)     Sit to sidelying: Modified independent (Device/Increase time)      Transfers Overall transfer level: Needs assistance Equipment used: Rolling walker (2 wheels) Transfers: Sit to/from Stand Sit to Stand: Supervision                Ambulation/Gait Ambulation/Gait assistance: Supervision Gait Distance (Feet): 125 Feet Assistive  device: Rolling walker (2 wheels) Gait Pattern/deviations: Step-through pattern;Decreased step length - right;Decreased step length - left;Trunk flexed Gait velocity: decr but appropr     General Gait Details: ambulating on RA with sats 88-89%   Stairs             Wheelchair Mobility    Modified Rankin (Stroke Patients Only)       Balance Overall balance assessment: Needs assistance Sitting-balance support: Single extremity supported Sitting balance-Leahy Scale: Fair     Standing balance support: No upper extremity supported Standing balance-Leahy Scale: Fair                              Cognition Arousal/Alertness: Awake/alert Behavior During Therapy: WFL for tasks assessed/performed Overall Cognitive Status: Within Functional Limits for tasks assessed                                          Exercises      General Comments General comments (skin integrity, edema, etc.): pt on room air at rest with sats 94%; pleth with variable readings while holding RW, however appeared he did desaturate to 88% with pt asymptomatic      Pertinent Vitals/Pain Pain Assessment: No/denies pain    Home Living                          Prior Function            PT Goals (current goals can now be found in the care  plan section) Acute Rehab PT Goals Patient Stated Goal: to get better PT Goal Formulation: With patient Time For Goal Achievement: 07/07/21 Potential to Achieve Goals: Good Progress towards PT goals: Progressing toward goals    Frequency    Min 3X/week      PT Plan Current plan remains appropriate    Co-evaluation              AM-PAC PT "6 Clicks" Mobility   Outcome Measure  Help needed turning from your back to your side while in a flat bed without using bedrails?: None Help needed moving from lying on your back to sitting on the side of a flat bed without using bedrails?: None Help needed moving to and  from a bed to a chair (including a wheelchair)?: A Little Help needed standing up from a chair using your arms (e.g., wheelchair or bedside chair)?: A Little Help needed to walk in hospital room?: A Little Help needed climbing 3-5 steps with a railing? : A Little 6 Click Score: 20    End of Session   Activity Tolerance: Patient tolerated treatment well Patient left: with call bell/phone within reach;in bed;with bed alarm set Nurse Communication: Mobility status PT Visit Diagnosis: Unsteadiness on feet (R26.81);Difficulty in walking, not elsewhere classified (R26.2)     Time: 1340-1401 PT Time Calculation (min) (ACUTE ONLY): 21 min  Charges:  $Gait Training: 8-22 mins                      Arby Barrette, PT Acute Rehabilitation Services  Pager [phone] Office [phone]    Rexanne Mano 06/26/2021, 2:13 PM

## 2021-06-26 NOTE — TOC Progression Note (Signed)
Transition of Care [facility]) - Progression Note    Patient Details  Name: Alexander Duran MRN: [id number] Date of Birth: [birth year]  [facility]) CM/SW Contact  Carles Collet, RN Phone Number: 06/26/2021, 9:08 AM  Clinical Narrative:    Notified [employer] of order for tube feeds. Please send patient home with a three day supply in hand on discharge.     Expected Discharge Plan: [place] Barriers to Discharge: Continued Medical Work up  Expected Discharge Plan and Services Expected Discharge Plan: [place] In-house Referral: NA Discharge Planning Services: CM Consult Post Acute Care Choice: Home Health, Durable Medical Equipment Living arrangements for the past 2 months: Single Family Home                 DME Arranged: Tube feeding DME Agency: [employer] Date DME Agency Contacted: 06/26/21 Time DME Agency Contacted: [phone] Representative spoke with at DME Agency: Freda Munro HH Arranged: PT, OT, Speech Therapy HH Agency: [place] Date [place]: 06/24/21 Time [place]: 1428 Representative spoke with at [place]: Amy   Social Determinants of Health ([place]) Interventions    Readmission Risk Interventions No flowsheet data found.

## 2021-06-26 NOTE — Progress Notes (Signed)
Urine                        PROGRESS NOTE        PATIENT DETAILS Name: Alexander Duran Age: 76 y.o. Sex: male Date of Birth: 14-Jul-1945 Admit Date: 06/17/2021 Admitting Physician Karmen Bongo, MD HOZ:[id number], Mikeal Hawthorne, MD  Brief Narrative: Patient is a 76 y.o. 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  See below for further details.  Subjective: No major issues overnight-rhinitis is better.  Continues to cough intermittently-PEG tube insertion site is still sore.  He is very anxious about being discharged today-he wants his family to do necessary arrangement at home so he can be discharged tomorrow.  He also wants his family to to be trained how to administer PEG tube feeds.    Objective: Vitals: Blood pressure (!) 142/77, pulse 61, temperature 97.7 F (36.5 C), resp. rate 17, height 5\' 6"  (1.676 m), weight 60.9 kg, SpO2 98 %.   Exam: Gen Exam:Alert awake-not in any distress HEENT:atraumatic, normocephalic Chest: B/L clear to auscultation anteriorly CVS:S1S2 regular Abdomen:soft non tender, non distended Extremities:no edema Neurology: Non focal Skin: no rash   Pertinent Labs/Radiology: Recent Labs  Lab 06/20/21 0150 06/21/21 0203 06/22/21 0148  WBC 6.8 5.9 5.5  HGB 10.4* 10.7* 10.6*  PLT 250 252 240  NA 137 136 138  K 3.7 3.7 4.5  CREATININE 0.67 0.61 0.54*  AST 22 20 22   ALT 15 16 13   ALKPHOS 43 43 40  BILITOT 0.8 0.8 0.5     Assessment/Plan: Aspiration pneumonia: Improved-afebrile-no leukocytosis-not short of breath-has completed a course of Unasyn.  Still continues to cough at times but otherwise stable.  Progressive oropharyngeal dysphagia: Likely sequelae of prior tonsillar cancer with radical  neck dissection/radiation-worsened further due to COVID-19 infection.  Evaluated by general surgery-s/p laparoscopic PEG tube insertion on 1/5.  Started on tube feeds on 1/6-seems to be tolerating well.  Have switched to bolus feedings-which she seems to be tolerating well.  RN staff to train patient/family as to how to administer PEG feeds.  COVID-19 infection: Likely incidental finding-suspect pneumonia is from aspiration-has completed a course of Remdesivir.   Mild hypoglycemia: Resolved.  Likely due to insulin use-Was on SSI due to mild hypoglycemia from tube feeds.  History of carotid artery disease: Continue aspirin/statin  HTN: BP stable-antihypertensives on hold.  History of tonsil cancer-s/p radical neck dissection/radiation: Currently in remission-resume outpatient follow-up with oncology.  Hypoalbuminemia with third spacing: Supportive care for now.  UA negative for proteinuria-echo with stable EF.  Nutrition Status: Nutrition Problem: Severe Malnutrition Etiology: chronic illness (Cancer, Dysphagia) Signs/Symptoms: severe muscle depletion, severe fat depletion Interventions: Tube feeding, Prostat  BMI Estimated body mass index is 21.67 kg/m as calculated from the following:   Height as of this encounter: 5\' 6"  (1.676 m).   Weight as of this encounter: 60.9 kg.   Procedures: None Consults: General surgery, IR DVT Prophylaxis: SQ Lovenox Code Status:DNR Family Communication: None at bedside   Disposition Plan: Status is: Inpatient  Remains inpatient appropriate because: Home on 1/11-patient requesting 1 additional day so that family can make arrangements at home-and wants a family member to get trained how to administer PEG tube feeds.  Diet: Diet Order  Diet NPO time specified  Diet effective now                     Antimicrobial agents: Anti-infectives (From admission, onward)    Start     Dose/Rate Route Frequency Ordered Stop   06/18/21  1000  remdesivir 100 mg in sodium chloride 0.9 % 100 mL IVPB       See Hyperspace for full Linked Orders Report.   100 mg 200 mL/hr over 30 Minutes Intravenous Daily 06/17/21 1923 06/21/21 1119   06/18/21 0700  cefTRIAXone (ROCEPHIN) 2 g in sodium chloride 0.9 % 100 mL IVPB  Status:  Discontinued        2 g 200 mL/hr over 30 Minutes Intravenous Every 24 hours 06/17/21 1548 06/17/21 1916   06/18/21 0700  azithromycin (ZITHROMAX) 500 mg in sodium chloride 0.9 % 250 mL IVPB  Status:  Discontinued        500 mg 250 mL/hr over 60 Minutes Intravenous Every 24 hours 06/17/21 1548 06/17/21 1916   06/17/21 2200  remdesivir 200 mg in sodium chloride 0.9% 250 mL IVPB       See Hyperspace for full Linked Orders Report.   200 mg 580 mL/hr over 30 Minutes Intravenous Once 06/17/21 1923 06/18/21 0058   06/17/21 2000  Ampicillin-Sulbactam (UNASYN) 3 g in sodium chloride 0.9 % 100 mL IVPB        3 g 200 mL/hr over 30 Minutes Intravenous Every 6 hours 06/17/21 1946 06/23/21 1224   06/17/21 1515  cefTRIAXone (ROCEPHIN) 1 g in sodium chloride 0.9 % 100 mL IVPB        1 g 200 mL/hr over 30 Minutes Intravenous  Once 06/17/21 1502 06/17/21 1630   06/17/21 1515  azithromycin (ZITHROMAX) 500 mg in sodium chloride 0.9 % 250 mL IVPB        500 mg 250 mL/hr over 60 Minutes Intravenous  Once 06/17/21 1502 06/17/21 1731        MEDICATIONS: Scheduled Meds:  acetaminophen (TYLENOL) oral liquid 160 mg/5 mL  1,000 mg Per Tube Q8H   albuterol  2 puff Inhalation Q6H   aspirin  81 mg Per Tube QPM   atorvastatin  20 mg Per Tube Daily   chlorhexidine  15 mL Mouth Rinse BID   docusate  100 mg Per Tube BID   enoxaparin (LOVENOX) injection  40 mg Subcutaneous Q24H   feeding supplement (OSMOLITE 1.5 CAL)  355 mL Per Tube QID   fluticasone  2 spray Each Nare Daily   levothyroxine  75 mcg Per Tube QAC breakfast   loratadine  10 mg Per Tube Daily   mouth rinse  15 mL Mouth Rinse q12n4p   midodrine  5 mg Per Tube TID WC    oxymetazoline  1 spray Each Nare BID   pantoprazole sodium  40 mg Per Tube Daily   sodium chloride flush  3 mL Intravenous Q12H   Continuous Infusions:  sodium chloride 10 mL/hr at 06/22/21 1142   methocarbamol (ROBAXIN) IV     PRN Meds:.bisacodyl, chlorpheniramine-HYDROcodone, guaiFENesin-dextromethorphan, hydrALAZINE, methocarbamol (ROBAXIN) IV, morphine injection, ondansetron **OR** ondansetron (ZOFRAN) IV, oxyCODONE, polyethylene glycol, sodium chloride   I have personally reviewed following labs and imaging studies  LABORATORY DATA: CBC: Recent Labs  Lab 06/20/21 0150 06/21/21 0203 06/22/21 0148  WBC 6.8 5.9 5.5  NEUTROABS 5.9 5.0 4.7  HGB 10.4* 10.7* 10.6*  HCT 31.4* 31.2* 31.5*  MCV 91.8 92.0 93.2  PLT 250 252  240     Basic Metabolic Panel: Recent Labs  Lab 06/20/21 0150 06/21/21 0203 06/22/21 0148  NA 137 136 138  K 3.7 3.7 4.5  CL 103 105 103  CO2 27 26 29   GLUCOSE 87 82 89  BUN 17 18 13   CREATININE 0.67 0.61 0.54*  CALCIUM 8.1* 8.0* 7.8*  MG 2.1 2.2 2.1  PHOS 2.8 2.5 2.6     GFR: Estimated Creatinine Clearance: 70.8 mL/min (A) (by C-G formula based on SCr of 0.54 mg/dL (L)).  Liver Function Tests: Recent Labs  Lab 06/20/21 0150 06/21/21 0203 06/22/21 0148  AST 22 20 22   ALT 15 16 13   ALKPHOS 43 43 40  BILITOT 0.8 0.8 0.5  PROT 5.4* 5.4* 5.3*  ALBUMIN 2.2* 2.3* 2.2*    No results for input(s): LIPASE, AMYLASE in the last 168 hours. No results for input(s): AMMONIA in the last 168 hours.  Coagulation Profile: No results for input(s): INR, PROTIME in the last 168 hours.   Cardiac Enzymes: No results for input(s): CKTOTAL, CKMB, CKMBINDEX, TROPONINI in the last 168 hours.  BNP (last 3 results) No results for input(s): PROBNP in the last 8760 hours.  Lipid Profile: No results for input(s): CHOL, HDL, LDLCALC, TRIG, CHOLHDL, LDLDIRECT in the last 72 hours.  Thyroid Function Tests: No results for input(s): TSH, T4TOTAL, FREET4,  T3FREE, THYROIDAB in the last 72 hours.  Anemia Panel: No results for input(s): VITAMINB12, FOLATE, FERRITIN, TIBC, IRON, RETICCTPCT in the last 72 hours.   Urine analysis:    Component Value Date/Time   COLORURINE YELLOW 06/20/2021 2227   APPEARANCEUR HAZY (A) 06/20/2021 2227   LABSPEC 1.030 06/20/2021 2227   PHURINE 5.0 06/20/2021 2227   GLUCOSEU NEGATIVE 06/20/2021 2227   HGBUR NEGATIVE 06/20/2021 2227   BILIRUBINUR NEGATIVE 06/20/2021 2227   KETONESUR 20 (A) 06/20/2021 2227   PROTEINUR NEGATIVE 06/20/2021 2227   NITRITE NEGATIVE 06/20/2021 2227   LEUKOCYTESUR NEGATIVE 06/20/2021 2227    Sepsis Labs: Lactic Acid, Venous    Component Value Date/Time   LATICACIDVEN 1.3 06/17/2021 1139    MICROBIOLOGY: Recent Results (from the past 240 hour(s))  Blood Culture (routine x 2)     Status: None   Collection Time: 06/17/21 11:39 AM   Specimen: BLOOD  Result Value Ref Range Status   Specimen Description BLOOD SITE NOT SPECIFIED  Final   Special Requests   Final    BOTTLES DRAWN AEROBIC AND ANAEROBIC Blood Culture adequate volume   Culture   Final    NO GROWTH 5 DAYS Performed at [facility], [address], [place], [place] [address]    Report Status 06/22/2021 FINAL  Final  Blood Culture (routine x 2)     Status: None   Collection Time: 06/17/21 11:44 AM   Specimen: BLOOD  Result Value Ref Range Status   Specimen Description BLOOD SITE NOT SPECIFIED  Final   Special Requests   Final    BOTTLES DRAWN AEROBIC AND ANAEROBIC Blood Culture adequate volume   Culture   Final    NO GROWTH 5 DAYS Performed at [facility], [place] [address], [place], [place] [address]    Report Status 06/22/2021 FINAL  Final  Resp Panel by RT-PCR (Flu A&B, Covid) Nasopharyngeal Swab     Status: Abnormal   Collection Time: 06/17/21  3:12 PM   Specimen: Nasopharyngeal Swab; Nasopharyngeal(NP) swabs in vial transport medium  Result Value Ref Range Status   SARS Coronavirus 2 by  RT  PCR POSITIVE (A) NEGATIVE Final    Comment: (NOTE) SARS-CoV-2 target nucleic acids are DETECTED.  The SARS-CoV-2 RNA is generally detectable in upper respiratory specimens during the acute phase of infection. Positive results are indicative of the presence of the identified virus, but do not rule out bacterial infection or co-infection with other pathogens not detected by the test. Clinical correlation with patient history and other diagnostic information is necessary to determine patient infection status. The expected result is Negative.  Fact Sheet for Patients: EntrepreneurPulse.com.au  Fact Sheet for Healthcare Providers: IncredibleEmployment.be  This test is not yet approved or cleared by the [place] FDA and  has been authorized for detection and/or diagnosis of SARS-CoV-2 by FDA under an Emergency Use Authorization (EUA).  This EUA will remain in effect (meaning this test can be used) for the duration of  the COVID-19 declaration under Section 564(b)(1) of the A ct, 21 U.S.C. section 360bbb-3(b)(1), unless the authorization is terminated or revoked sooner.     Influenza A by PCR NEGATIVE NEGATIVE Final   Influenza B by PCR NEGATIVE NEGATIVE Final    Comment: (NOTE) The Xpert Xpress SARS-CoV-2/FLU/RSV plus assay is intended as an aid in the diagnosis of influenza from Nasopharyngeal swab specimens and should not be used as a sole basis for treatment. Nasal washings and aspirates are unacceptable for Xpert Xpress SARS-CoV-2/FLU/RSV testing.  Fact Sheet for Patients: EntrepreneurPulse.com.au  Fact Sheet for Healthcare Providers: IncredibleEmployment.be  This test is not yet approved or cleared by the [place] FDA and has been authorized for detection and/or diagnosis of SARS-CoV-2 by FDA under an Emergency Use Authorization (EUA). This EUA will remain in effect (meaning this test can be  used) for the duration of the COVID-19 declaration under Section 564(b)(1) of the Act, 21 U.S.C. section 360bbb-3(b)(1), unless the authorization is terminated or revoked.  Performed at [facility], [place] [address], [place],  [address]     RADIOLOGY STUDIES/RESULTS: No results found.   LOS: 9 days   Oren Binet, MD  [facility]    To contact the attending provider between 7A-7P or the covering provider during after hours 7P-7A, please log into the web site [email or web address] and access using universal [place] password for that web site. If you do not have the password, please call the hospital operator.  06/26/2021, 11:38 AM

## 2021-06-27 ENCOUNTER — Other Ambulatory Visit (HOSPITAL_COMMUNITY): Payer: Self-pay

## 2021-06-27 DIAGNOSIS — J690 Pneumonitis due to inhalation of food and vomit: Secondary | ICD-10-CM | POA: Diagnosis not present

## 2021-06-27 DIAGNOSIS — U071 COVID-19: Secondary | ICD-10-CM | POA: Diagnosis not present

## 2021-06-27 DIAGNOSIS — R1310 Dysphagia, unspecified: Secondary | ICD-10-CM | POA: Diagnosis not present

## 2021-06-27 DIAGNOSIS — I10 Essential (primary) hypertension: Secondary | ICD-10-CM | POA: Diagnosis not present

## 2021-06-27 LAB — GLUCOSE, CAPILLARY
Glucose-Capillary: 78 mg/dL (ref 70–99)
Glucose-Capillary: 81 mg/dL (ref 70–99)

## 2021-06-27 MED ORDER — ACETAMINOPHEN 160 MG/5ML PO SUSP
650.0000 mg | Freq: Three times a day (TID) | ORAL | 0 refills | Status: AC | PRN
  Filled 2021-06-27: qty 118, 2d supply, fill #0

## 2021-06-27 MED ORDER — FLUTICASONE PROPIONATE 50 MCG/ACT NA SUSP
2.0000 | Freq: Every day | NASAL | 2 refills | Status: DC
  Filled 2021-06-27: qty 16, 30d supply, fill #0

## 2021-06-27 MED ORDER — MIDODRINE HCL 5 MG PO TABS
5.0000 mg | ORAL_TABLET | Freq: Three times a day (TID) | ORAL | 2 refills | Status: DC
  Filled 2021-06-27: qty 90, 30d supply, fill #0

## 2021-06-27 MED ORDER — OSMOLITE 1.5 CAL PO LIQD
355.0000 mL | Freq: Four times a day (QID) | ORAL | 2 refills | Status: DC
  Filled 2021-06-27: qty 127800, 90d supply, fill #0

## 2021-06-27 MED ORDER — LORATADINE 10 MG PO TABS
10.0000 mg | ORAL_TABLET | Freq: Every day | ORAL | 0 refills | Status: DC
  Filled 2021-06-27: qty 15, 15d supply, fill #0

## 2021-06-27 MED ORDER — PANTOPRAZOLE SODIUM 40 MG PO PACK
40.0000 mg | PACK | Freq: Every day | ORAL | 2 refills | Status: DC
Start: 2021-06-27 — End: 2021-07-27
  Filled 2021-06-27: qty 30, 30d supply, fill #0

## 2021-06-27 NOTE — Discharge Summary (Signed)
PATIENT DETAILS Name: Alexander Duran Age: 76 y.o. Sex: male Date of Birth: [birth year] MRN: [id number]. Admitting Physician: Karmen Bongo, MD [id number], Mikeal Hawthorne, MD  Admit Date: 06/17/2021 Discharge date: 06/27/2021  Recommendations for Outpatient Follow-up:  Follow up with PCP in 1-2 weeks Please obtain CMP/CBC in one week   Admitted From:  Home  Disposition: Home with home health services   [place]: Yes  Equipment/Devices: None  Discharge Condition: Stable  CODE STATUS: DNR  Diet recommendation:  Diet Order             Diet - low sodium heart healthy           Diet NPO time specified  Diet effective now                    Brief Summary: Patient is a 76 y.o. 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  See below for further details.  Brief Hospital Course: Aspiration pneumonia: Improved-afebrile-no leukocytosis-not short of breath-has completed a course of Unasyn.  Still continues to cough at times but otherwise stable.   Progressive oropharyngeal dysphagia: Likely sequelae of prior tonsillar cancer with radical neck dissection/radiation-worsened further due to COVID-19 infection.  Evaluated by general surgery-s/p laparoscopic PEG tube insertion on 1/5.  Started on tube feeds on 1/6-seems to be tolerating well.  Have switched to bolus feedings-which she seems to be tolerating well.  RN history trained/educated patient as to how to use bolus feeds.  Home health arrangements have been made by case management.  Per patient-he has minimal pain at the PEG tube insertion site-and does not want narcotics on discharge.  Patient already has follow-up from [facility] surgery for suture removal-he  was asked to keep this appointment.   COVID-19 infection: Likely incidental finding-suspect pneumonia is from aspiration-has completed a course of Remdesivir.    Mild hypoglycemia: Resolved.  Likely due to insulin use-Was on SSI due to mild hypoglycemia from tube feeds.   History of carotid artery disease: Continue aspirin/statin  HTN: BP stable-no longer on amlodipine.  Did require low-dose midodrine during this hospitalization-PCP to reassess and titrate off midodrine accordingly.   History of tonsil cancer-s/p radical neck dissection/radiation: Currently in remission-resume outpatient follow-up with oncology.   Hypoalbuminemia with third spacing: Supportive care for now.  UA negative for proteinuria-echo with stable EF.   Nutrition Status: Nutrition Problem: Severe Malnutrition Etiology: chronic illness (Cancer, Dysphagia) Signs/Symptoms: severe muscle depletion, severe fat depletion Interventions: Tube feeding, Prostat   BMI Estimated body mass index is 21.67 kg/m as calculated from the following:   Height as of this encounter: 5\' 6"  (1.676 m).   Weight as of this encounter: 60.9 kg.     Procedures 1/5>> laparoscopic G-tube insertion by general surgery.  Discharge Diagnoses:  Principal Problem:   LLL pneumonia Active Problems:   Tonsil cancer ([place])   Hypothyroidism   Essential hypertension   Carotid disease, bilateral ([place])   Hyponatremia   Dysphagia   DNR (do not resuscitate)   COVID-19 virus infection   Protein-calorie malnutrition, severe   Discharge Instructions:  Activity:  As tolerated with Full fall precautions use walker/cane & assistance as needed   Discharge Instructions     Call MD for:  difficulty breathing, headache or visual disturbances  Complete by: As directed    Call MD for:  redness, tenderness, or signs of infection (pain, swelling, redness, odor or green/yellow discharge around incision site)   Complete by: As directed    Diet - low  sodium heart healthy   Complete by: As directed    Discharge instructions   Complete by: As directed    Follow with Primary MD  Windy Fast, MD in 1-2 weeks  Please get a complete blood count and chemistry panel checked by your Primary MD at your next visit, and again as instructed by your Primary MD.  Get Medicines reviewed and adjusted: Please take all your medications with you for your next visit with your Primary MD  Laboratory/radiological data: Please request your Primary MD to go over all hospital tests and procedure/radiological results at the follow up, please ask your Primary MD to get all Hospital records sent to his/her office.  In some cases, they will be blood work, cultures and biopsy results pending at the time of your discharge. Please request that your primary care M.D. follows up on these results.  Also Note the following: If you experience worsening of your admission symptoms, develop shortness of breath, life threatening emergency, suicidal or homicidal thoughts you must seek medical attention immediately by calling 911 or calling your MD immediately  if symptoms less severe.  You must read complete instructions/literature along with all the possible adverse reactions/side effects for all the Medicines you take and that have been prescribed to you. Take any new Medicines after you have completely understood and accpet all the possible adverse reactions/side effects.   Do not drive when taking Pain medications or sleeping medications (Benzodaizepines)  Do not take more than prescribed Pain, Sleep and Anxiety Medications. It is not advisable to combine anxiety,sleep and pain medications without talking with your primary care practitioner  Special Instructions: If you have smoked or chewed Tobacco  in the last 2 yrs please stop smoking, stop any regular Alcohol  and or any Recreational drug use.  Wear Seat belts while driving.  Please note: You were cared for by a  hospitalist during your hospital stay. Once you are discharged, your primary care physician will handle any further medical issues. Please note that NO REFILLS for any discharge medications will be authorized once you are discharged, as it is imperative that you return to your primary care physician (or establish a relationship with a primary care physician if you do not have one) for your post hospital discharge needs so that they can reassess your need for medications and monitor your lab values.   Increase activity slowly   Complete by: As directed    No wound care   Complete by: As directed       Allergies as of 06/27/2021       Reactions   Iodinated Contrast Media    Other reaction(s): Weakness present        Medication List     STOP taking these medications    amLODipine 5 MG tablet Commonly known as: NORVASC       TAKE these medications    acetaminophen 160 MG/5ML solution Commonly known as: TYLENOL Place 20.3 mLs (650 mg total) into feeding tube every 8 (eight) hours as needed for mild pain.   albuterol 108 (90 Base) MCG/ACT inhaler Commonly known as: VENTOLIN HFA Inhale 2 puffs into the lungs every 6 (six) hours as needed for wheezing or shortness of breath.   aspirin 81 MG  chewable tablet Chew 81 mg by mouth every evening.   atorvastatin 20 MG tablet Commonly known as: LIPITOR Take 20 mg by mouth daily.   feeding supplement (OSMOLITE 1.5 CAL) Liqd Place 355 mLs into feeding tube 4 (four) times daily.   fluticasone 50 MCG/ACT nasal spray Commonly known as: FLONASE Place 2 sprays into both nostrils daily. Start taking on: June 28, 2021   levothyroxine 75 MCG tablet Commonly known as: SYNTHROID Take 75 mcg by mouth daily before breakfast.   loratadine 10 MG tablet Commonly known as: CLARITIN Place 1 tablet (10 mg total) into feeding tube daily. Start taking on: June 28, 2021   midodrine 5 MG tablet Commonly known as: PROAMATINE Place 1  tablet (5 mg total) into feeding tube 3 (three) times daily with meals.   OVER THE COUNTER MEDICATION TheraTears Liquid Eye Drops- Place 1-2 drops into both eyes one to three times as day as needed for dryness or irritation   pantoprazole sodium 40 mg Commonly known as: PROTONIX Place 40 mg into feeding tube daily.               Durable Medical Equipment  (From admission, onward)           Start     Ordered   06/26/21 0852  For home use only DME Tube feeding  Once       Comments: 1.5 cartons (342ml) of Osmolite 1.5 QID at 0800, 1200, 1600, 2000 150 ml of free water q 4 hours   Provides 2130 kcal, 89 grams protein, and 1979 ml of free water daily.   06/26/21 0852            [facility]up [facility] Go on 07/02/2021.   Specialty: General Surgery Why: Your appointment is 07/02/21 at 10am for suture removal. Please arrive 30 minutes prior to your appointment to check in and fill out paperwork. Bring photo ID and insurance information. Contact information: [address] [place] [phone]        Windy Fast, MD. Schedule an appointment as soon as possible for a visit in 1 week(s).   Specialty: Internal Medicine Contact information: [phone] [place] [phone]                Allergies  Allergen Reactions   Iodinated Contrast Media     Other reaction(s): Weakness present      Consultations: Surgery, interventional radiology.   Other Procedures/Studies: CT ABDOMEN WO CONTRAST  Result Date: 06/20/2021 CLINICAL DATA:  Dysphagia.  Evaluate for gastrostomy tube placement. EXAM: CT ABDOMEN WITHOUT CONTRAST TECHNIQUE: Multidetector CT imaging of the abdomen was performed following the standard protocol without IV contrast. COMPARISON:  Prior renal ultrasound 03/19/2018; CT scan of the chest 03/09/2021 FINDINGS: Lower chest: Trace right pleural effusion with  associated atelectasis. Small left sub pulmonic effusion with associated dense atelectasis versus infiltrate in the left lower lobe. Additional patchy foci of ground-glass attenuation airspace opacity are present in the inferior aspect of the middle lobe. Hepatobiliary: Normal hepatic contour morphology. No discrete hepatic lesion. High attenuation material layers within the gallbladder consistent with sludge and/or small stones. No biliary ductal dilatation. Pancreas: Unremarkable. No pancreatic ductal dilatation or surrounding inflammatory changes. Spleen: Normal in size without focal abnormality. Adrenals/Urinary Tract: Diffuse anasarca and intra-abdominal edema results in obscuration of the adrenal glands. No gross abnormality. No hydronephrosis. Grossly stable left upper pole renal cyst compared  to prior imaging. Stomach/Bowel: Relatively high position of the stomach with significant colonic interposition. The transverse colon rises to the level of the xiphoid process anterior to the stomach. Vascular/Lymphatic: Limited evaluation in the absence of intravenous contrast. Extensive atherosclerotic calcifications throughout the aorta. No significant lymphadenopathy. Other: Diffuse body wall edema and mesenteric edema. Additionally, there is small volume ascites. Musculoskeletal: No acute fracture or aggressive appearing lytic or blastic osseous lesion. IMPRESSION: 1. Significant colonic interposition. The mid aspect of the transverse colon extends to the level of the xiphoid process completely obscuring the gastric antrum. Anatomically, this patient is NOT a candidate for percutaneous gastrostomy tube placement. 2. Anasarca, mesenteric edema, small volume ascites and small (left larger than right) pleural effusions consistent with third spacing versus volume overload. 3. Dense consolidation and volume loss in the left lower lobe consistent with atelectasis. 4. Patchy areas of ground-glass attenuation airspace  opacity in the inferior right middle lobe may represent pneumonia or aspiration. 5.  Aortic Atherosclerosis (ICD10-I70.0). 6. Sludge and/or small stones in the gallbladder. Electronically Signed   By: Jacqulynn Cadet M.D.   On: 06/20/2021 08:04   DG Chest Port 1 View  Result Date: 06/17/2021 CLINICAL DATA:  76 year old male with history of sepsis. EXAM: PORTABLE CHEST 1 VIEW COMPARISON:  Chest x-ray 04/26/2021. FINDINGS: Consolidation in the medial aspect of the left liver lobe. Diffuse peribronchial cuffing scattered throughout the lungs bilaterally. No pleural effusions. No pneumothorax. No evidence of pulmonary edema. Heart size is normal. Upper mediastinal contours are within normal limits. Atherosclerotic calcifications. IMPRESSION: 1. Bronchitis with left lower lobe pneumonia. 2. Aortic atherosclerosis. Electronically Signed   By: Vinnie Langton M.D.   On: 06/17/2021 12:08   ECHOCARDIOGRAM COMPLETE  Result Date: 06/21/2021    ECHOCARDIOGRAM REPORT   Patient Name:   Alexander Duran Date of Exam: 06/21/2021 Medical Rec #:  [id number]        Height:       66.0 in Accession #:    [id number]       Weight:       130.5 lb Date of Birth:  1946-06-16        BSA:          1.668 m Patient Age:    81 years         BP:           141/87 mmHg Patient Gender: M                HR:           56 bpm. Exam Location:  Inpatient Procedure: 2D Echo, Cardiac Doppler and Color Doppler Indications:    Dyspnea  History:        Patient has no prior history of Echocardiogram examinations.                 Signs/Symptoms:Chest Pain; Risk Factors:Hypertension.  Sonographer:    Glo Herring Referring Phys: [place]  1. Left ventricular ejection fraction, by estimation, is 55 to 60%. The left ventricle has normal function. The left ventricular internal cavity size was mildly dilated. Left ventricular diastolic parameters were normal.  2. Right ventricular systolic function is low normal. The right  ventricular size is mildly enlarged.  3. Right atrial size was severely dilated.  4. Soft tissue attenuation in pericardium consistent with probable epicardial fat.Marland Kitchen a small pericardial effusion is present. The pericardial effusion is circumferential. Moderate pleural effusion.  5. The mitral valve is normal  in structure. Trivial mitral valve regurgitation.  6. The aortic valve is tricuspid. Aortic valve regurgitation is not visualized. Aortic valve sclerosis is present, with no evidence of aortic valve stenosis.  7. The inferior vena cava is dilated in size with <50% respiratory variability, suggesting right atrial pressure of 15 mmHg. FINDINGS  Left Ventricle: Left ventricular ejection fraction, by estimation, is 55 to 60%. The left ventricle has normal function. The left ventricular internal cavity size was mildly dilated. There is no left ventricular hypertrophy. Left ventricular diastolic parameters were normal. Right Ventricle: The right ventricular size is mildly enlarged. Right vetricular wall thickness was not assessed. Right ventricular systolic function is low normal. Left Atrium: Left atrial size was normal in size. Right Atrium: Right atrial size was severely dilated. Pericardium: Soft tissue attenuation in pericardium consistent with probable epicardial fat. A small pericardial effusion is present. The pericardial effusion is circumferential. Mitral Valve: The mitral valve is normal in structure. Trivial mitral valve regurgitation. Tricuspid Valve: The tricuspid valve is normal in structure. Tricuspid valve regurgitation is trivial. Aortic Valve: The aortic valve is tricuspid. Aortic valve regurgitation is not visualized. Aortic valve sclerosis is present, with no evidence of aortic valve stenosis. Aortic valve mean gradient measures 4.0 mmHg. Aortic valve peak gradient measures 6.8  mmHg. Aortic valve area, by VTI measures 1.78 cm. Pulmonic Valve: The pulmonic valve was not well visualized. Pulmonic  valve regurgitation is not visualized. Aorta: The aortic root and ascending aorta are structurally normal, with no evidence of dilitation. Venous: The inferior vena cava is dilated in size with less than 50% respiratory variability, suggesting right atrial pressure of 15 mmHg. IAS/Shunts: No atrial level shunt detected by color flow Doppler. Additional Comments: There is a moderate pleural effusion.  LEFT VENTRICLE PLAX 2D LVIDd:         5.30 cm   Diastology LVIDs:         4.00 cm   LV e' medial:    8.05 cm/s LV PW:         1.00 cm   LV E/e' medial:  11.8 LV IVS:        1.00 cm   LV e' lateral:   11.10 cm/s LVOT diam:     2.00 cm   LV E/e' lateral: 8.5 LV SV:         63 LV SV Index:   38 LVOT Area:     3.14 cm  RIGHT VENTRICLE             IVC RV Basal diam:  4.80 cm     IVC diam: 2.20 cm RV Mid diam:    3.10 cm RV S prime:     12.10 cm/s LEFT ATRIUM         Index       RIGHT ATRIUM           Index LA diam:    3.60 cm 2.16 cm/m  RA Area:     29.60 cm                                 RA Volume:   114.00 ml 68.34 ml/m  AORTIC VALVE AV Area (Vmax):    1.75 cm AV Area (Vmean):   1.71 cm AV Area (VTI):     1.78 cm AV Vmax:           130.00 cm/s AV Vmean:  89.600 cm/s AV VTI:            0.357 m AV Peak Grad:      6.8 mmHg AV Mean Grad:      4.0 mmHg LVOT Vmax:         72.50 cm/s LVOT Vmean:        48.700 cm/s LVOT VTI:          0.202 m LVOT/AV VTI ratio: 0.57  AORTA Ao Root diam: 3.10 cm Ao Asc diam:  3.10 cm MITRAL VALVE MV Area (PHT): 3.89 cm    SHUNTS MV Decel Time: 195 msec    Systemic VTI:  0.20 m MV E velocity: 94.80 cm/s  Systemic Diam: 2.00 cm MV A velocity: 47.20 cm/s MV E/A ratio:  2.01 Dorris Carnes MD Electronically signed by Dorris Carnes MD Signature Date/Time: 06/21/2021/2:56:28 PM    Final      TODAY-DAY OF DISCHARGE:  Subjective:   Alexander Duran today has no headache,no chest abdominal pain,no new weakness tingling or numbness, feels much better wants to go home today.   Objective:    Blood pressure (!) 146/81, pulse 62, temperature 97.6 F (36.4 C), temperature source Temporal, resp. rate 17, height 5\' 6"  (1.676 m), weight 59.2 kg, SpO2 96 %.  Intake/Output Summary (Last 24 hours) at 06/27/2021 1005 Last data filed at 06/27/2021 0817 Gross per 24 hour  Intake 355 ml  Output 1675 ml  Net -1320 ml   Filed Weights   06/24/21 0500 06/26/21 0502 06/27/21 0435  Weight: 60 kg 60.9 kg 59.2 kg    Exam: Awake Alert, Oriented *3, No new F.N deficits, Normal affect [place].AT,PERRAL Supple Neck,No JVD, No cervical lymphadenopathy appriciated.  Symmetrical Chest wall movement, Good air movement bilaterally, CTAB RRR,No Gallops,Rubs or new Murmurs, No Parasternal Heave +ve B.Sounds, Abd Soft, Non tender, No organomegaly appriciated, No rebound -guarding or rigidity. No Cyanosis, Clubbing or edema, No new Rash or bruise   PERTINENT RADIOLOGIC STUDIES: No results found.   PERTINENT LAB RESULTS: CBC: No results for input(s): WBC, HGB, HCT, PLT in the last 72 hours. CMET CMP     Component Value Date/Time   NA 138 06/22/2021 0148   NA 125 (L) 08/10/2015 1449   K 4.5 06/22/2021 0148   K 3.3 (L) 08/10/2015 1449   CL 103 06/22/2021 0148   CO2 29 06/22/2021 0148   CO2 28 08/10/2015 1449   GLUCOSE 89 06/22/2021 0148   GLUCOSE 103 08/10/2015 1449   BUN 13 06/22/2021 0148   BUN 13.0 08/10/2015 1449   CREATININE 0.54 (L) 06/22/2021 0148   CREATININE 0.8 08/10/2015 1449   CALCIUM 7.8 (L) 06/22/2021 0148   CALCIUM 9.0 08/10/2015 1449   PROT 5.3 (L) 06/22/2021 0148   PROT 7.5 08/10/2015 1449   ALBUMIN 2.2 (L) 06/22/2021 0148   ALBUMIN 4.1 08/10/2015 1449   AST 22 06/22/2021 0148   AST 55 (H) 08/10/2015 1449   ALT 13 06/22/2021 0148   ALT 28 08/10/2015 1449   ALKPHOS 40 06/22/2021 0148   ALKPHOS 59 08/10/2015 1449   BILITOT 0.5 06/22/2021 0148   BILITOT 0.63 08/10/2015 1449   GFRNONAA >60 06/22/2021 0148   GFRAA >60 10/28/2019 0248    GFR Estimated Creatinine  Clearance: 68.9 mL/min (A) (by C-G formula based on SCr of 0.54 mg/dL (L)). No results for input(s): LIPASE, AMYLASE in the last 72 hours. No results for input(s): CKTOTAL, CKMB, CKMBINDEX, TROPONINI in the last 72 hours. Invalid input(s): POCBNP No  results for input(s): DDIMER in the last 72 hours. No results for input(s): HGBA1C in the last 72 hours. No results for input(s): CHOL, HDL, LDLCALC, TRIG, CHOLHDL, LDLDIRECT in the last 72 hours. No results for input(s): TSH, T4TOTAL, T3FREE, THYROIDAB in the last 72 hours.  Invalid input(s): FREET3 No results for input(s): VITAMINB12, FOLATE, FERRITIN, TIBC, IRON, RETICCTPCT in the last 72 hours. Coags: No results for input(s): INR in the last 72 hours.  Invalid input(s): PT Microbiology: Recent Results (from the past 240 hour(s))  Blood Culture (routine x 2)     Status: None   Collection Time: 06/17/21 11:39 AM   Specimen: BLOOD  Result Value Ref Range Status   Specimen Description BLOOD SITE NOT SPECIFIED  Final   Special Requests   Final    BOTTLES DRAWN AEROBIC AND ANAEROBIC Blood Culture adequate volume   Culture   Final    NO GROWTH 5 DAYS Performed at [facility], [address], [place], [place] [address]    Report Status 06/22/2021 FINAL  Final  Blood Culture (routine x 2)     Status: None   Collection Time: 06/17/21 11:44 AM   Specimen: BLOOD  Result Value Ref Range Status   Specimen Description BLOOD SITE NOT SPECIFIED  Final   Special Requests   Final    BOTTLES DRAWN AEROBIC AND ANAEROBIC Blood Culture adequate volume   Culture   Final    NO GROWTH 5 DAYS Performed at [facility], [place] [address], [place], [place] [address]    Report Status 06/22/2021 FINAL  Final  Resp Panel by RT-PCR (Flu A&B, Covid) Nasopharyngeal Swab     Status: Abnormal   Collection Time: 06/17/21  3:12 PM   Specimen: Nasopharyngeal Swab; Nasopharyngeal(NP) swabs in vial transport medium  Result Value Ref Range Status   SARS  Coronavirus 2 by RT PCR POSITIVE (A) NEGATIVE Final    Comment: (NOTE) SARS-CoV-2 target nucleic acids are DETECTED.  The SARS-CoV-2 RNA is generally detectable in upper respiratory specimens during the acute phase of infection. Positive results are indicative of the presence of the identified virus, but do not rule out bacterial infection or co-infection with other pathogens not detected by the test. Clinical correlation with patient history and other diagnostic information is necessary to determine patient infection status. The expected result is Negative.  Fact Sheet for Patients: EntrepreneurPulse.com.au  Fact Sheet for Healthcare Providers: IncredibleEmployment.be  This test is not yet approved or cleared by the [place] FDA and  has been authorized for detection and/or diagnosis of SARS-CoV-2 by FDA under an Emergency Use Authorization (EUA).  This EUA will remain in effect (meaning this test can be used) for the duration of  the COVID-19 declaration under Section 564(b)(1) of the A ct, 21 U.S.C. section 360bbb-3(b)(1), unless the authorization is terminated or revoked sooner.     Influenza A by PCR NEGATIVE NEGATIVE Final   Influenza B by PCR NEGATIVE NEGATIVE Final    Comment: (NOTE) The Xpert Xpress SARS-CoV-2/FLU/RSV plus assay is intended as an aid in the diagnosis of influenza from Nasopharyngeal swab specimens and should not be used as a sole basis for treatment. Nasal washings and aspirates are unacceptable for Xpert Xpress SARS-CoV-2/FLU/RSV testing.  Fact Sheet for Patients: EntrepreneurPulse.com.au  Fact Sheet for Healthcare Providers: IncredibleEmployment.be  This test is not yet approved or cleared by the [place] FDA and has been authorized for detection and/or diagnosis of SARS-CoV-2 by FDA under an  Emergency Use Authorization (EUA). This EUA will remain in effect (meaning  this test can be used) for the duration of the COVID-19 declaration under Section 564(b)(1) of the Act, 21 U.S.C. section 360bbb-3(b)(1), unless the authorization is terminated or revoked.  Performed at [facility], [place] [address] [place], [place] [address]     FURTHER DISCHARGE INSTRUCTIONS:  Get Medicines reviewed and adjusted: Please take all your medications with you for your next visit with your Primary MD  Laboratory/radiological data: Please request your Primary MD to go over all hospital tests and procedure/radiological results at the follow up, please ask your Primary MD to get all Hospital records sent to his/her office.  In some cases, they will be blood work, cultures and biopsy results pending at the time of your discharge. Please request that your primary care M.D. goes through all the records of your hospital data and follows up on these results.  Also Note the following: If you experience worsening of your admission symptoms, develop shortness of breath, life threatening emergency, suicidal or homicidal thoughts you must seek medical attention immediately by calling 911 or calling your MD immediately  if symptoms less severe.  You must read complete instructions/literature along with all the possible adverse reactions/side effects for all the Medicines you take and that have been prescribed to you. Take any new Medicines after you have completely understood and accpet all the possible adverse reactions/side effects.   Do not drive when taking Pain medications or sleeping medications (Benzodaizepines)  Do not take more than prescribed Pain, Sleep and Anxiety Medications. It is not advisable to combine anxiety,sleep and pain medications without talking with your primary care practitioner  Special Instructions: If you have smoked or chewed Tobacco  in the last 2 yrs please stop smoking, stop any regular Alcohol  and or any Recreational drug use.  Wear Seat belts  while driving.  Please note: You were cared for by a hospitalist during your hospital stay. Once you are discharged, your primary care physician will handle any further medical issues. Please note that NO REFILLS for any discharge medications will be authorized once you are discharged, as it is imperative that you return to your primary care physician (or establish a relationship with a primary care physician if you do not have one) for your post hospital discharge needs so that they can reassess your need for medications and monitor your lab values.  Total Time spent coordinating discharge including counseling, education and face to face time equals 35 minutes.  SignedOren Binet 06/27/2021 10:05 AM

## 2021-06-27 NOTE — Plan of Care (Signed)
°  Problem: Activity: Goal: Ability to tolerate increased activity will improve Outcome: Progressing   Problem: Clinical Measurements: Goal: Ability to maintain a body temperature in the normal range will improve Outcome: Progressing   Problem: Respiratory: Goal: Ability to maintain adequate ventilation will improve Outcome: Progressing Goal: Ability to maintain a clear airway will improve Outcome: Progressing   Problem: Education: Goal: Knowledge of General Education information will improve Description: Including pain rating scale, medication(s)/side effects and non-pharmacologic comfort measures Outcome: Progressing   Problem: Health Behavior/Discharge Planning: Goal: Ability to manage health-related needs will improve Outcome: Progressing   Problem: Clinical Measurements: Goal: Ability to maintain clinical measurements within normal limits will improve Outcome: Progressing Goal: Will remain free from infection Outcome: Progressing Goal: Diagnostic test results will improve Outcome: Progressing Goal: Respiratory complications will improve Outcome: Progressing Goal: Cardiovascular complication will be avoided Outcome: Progressing   Problem: Activity: Goal: Risk for activity intolerance will decrease Outcome: Progressing   Problem: Nutrition: Goal: Adequate nutrition will be maintained Outcome: Progressing   Problem: Coping: Goal: Level of anxiety will decrease Outcome: Progressing   Problem: Pain Managment: Goal: General experience of comfort will improve Outcome: Progressing   Problem: Safety: Goal: Ability to remain free from injury will improve Outcome: Progressing   Problem: Skin Integrity: Goal: Risk for impaired skin integrity will decrease Outcome: Progressing   

## 2021-06-27 NOTE — TOC Transition Note (Signed)
Transition of Care [facility]) - CM/SW Discharge Note   Patient Details  Name: Alexander Duran MRN: [id number] Date of Birth: [birth year]  [facility]) CM/SW Contact:  Cyndi Bender, RN Phone Number: 06/27/2021, 12:24 PM   Clinical Narrative:    Patient stable for discharge Orders for [place] and tube feeding. Notified Amy at [facility] of discharge and the need for RN/OT/PT/SLP. Tube feeding order from [employer]. Patient states he knows how to do the tube feeding.  Patient's brother able to transport patient home    Barriers to Discharge: Continued Medical Work up   Patient Goals and CMS Choice Patient states their goals for this hospitalization and ongoing recovery are:: To get stronger and to get home CMS Medicare.gov Compare Post Acute Care list provided to:: Patient Choice offered to / list presented to : Patient  Discharge Placement                   Home with wife    Discharge Plan and Services In-house Referral: NA Discharge Planning Services: CM Consult Post Acute Care Choice: Home Health, Durable Medical Equipment          DME Arranged: Tube feeding DME Agency: [employer] Date DME Agency Contacted: 06/26/21 Time DME Agency Contacted: [id number] Representative spoke with at DME Agency: Freda Munro HH Arranged: RN, PT, OT, Speech Therapy HH Agency: [place] Date [place]: 06/24/21 Time [place]: 1428 Representative spoke with at [place]: [place] Interventions     Readmission Risk Interventions Readmission Risk Prevention Plan 06/27/2021  Transportation Screening Complete  PCP or Specialist Appt within 5-7 Days Complete  Home Care Screening Complete  Medication Review (RN CM) Complete  Some recent data might be hidden

## 2021-07-12 ENCOUNTER — Emergency Department (HOSPITAL_COMMUNITY): Payer: No Typology Code available for payment source

## 2021-07-12 ENCOUNTER — Inpatient Hospital Stay (HOSPITAL_COMMUNITY)
Admission: EM | Admit: 2021-07-12 | Discharge: 2021-07-27 | DRG: 640 | Disposition: A | Discharge status: Hospice | Payer: No Typology Code available for payment source | Attending: Internal Medicine | Admitting: Internal Medicine

## 2021-07-12 ENCOUNTER — Other Ambulatory Visit: Payer: Self-pay

## 2021-07-12 DIAGNOSIS — Z91041 Radiographic dye allergy status: Secondary | ICD-10-CM

## 2021-07-12 DIAGNOSIS — Z87891 Personal history of nicotine dependence: Secondary | ICD-10-CM

## 2021-07-12 DIAGNOSIS — Z79890 Hormone replacement therapy (postmenopausal): Secondary | ICD-10-CM

## 2021-07-12 DIAGNOSIS — Z931 Gastrostomy status: Secondary | ICD-10-CM

## 2021-07-12 DIAGNOSIS — C099 Malignant neoplasm of tonsil, unspecified: Secondary | ICD-10-CM | POA: Diagnosis present

## 2021-07-12 DIAGNOSIS — R197 Diarrhea, unspecified: Secondary | ICD-10-CM | POA: Diagnosis present

## 2021-07-12 DIAGNOSIS — J189 Pneumonia, unspecified organism: Secondary | ICD-10-CM | POA: Diagnosis present

## 2021-07-12 DIAGNOSIS — R471 Dysarthria and anarthria: Secondary | ICD-10-CM | POA: Diagnosis present

## 2021-07-12 DIAGNOSIS — M4802 Spinal stenosis, cervical region: Secondary | ICD-10-CM | POA: Diagnosis present

## 2021-07-12 DIAGNOSIS — E861 Hypovolemia: Secondary | ICD-10-CM | POA: Diagnosis present

## 2021-07-12 DIAGNOSIS — R531 Weakness: Secondary | ICD-10-CM | POA: Diagnosis not present

## 2021-07-12 DIAGNOSIS — G629 Polyneuropathy, unspecified: Secondary | ICD-10-CM | POA: Diagnosis present

## 2021-07-12 DIAGNOSIS — Z8616 Personal history of COVID-19: Secondary | ICD-10-CM

## 2021-07-12 DIAGNOSIS — E860 Dehydration: Secondary | ICD-10-CM | POA: Diagnosis not present

## 2021-07-12 DIAGNOSIS — I4510 Unspecified right bundle-branch block: Secondary | ICD-10-CM | POA: Diagnosis present

## 2021-07-12 DIAGNOSIS — R042 Hemoptysis: Secondary | ICD-10-CM | POA: Diagnosis not present

## 2021-07-12 DIAGNOSIS — R64 Cachexia: Secondary | ICD-10-CM | POA: Diagnosis present

## 2021-07-12 DIAGNOSIS — G4733 Obstructive sleep apnea (adult) (pediatric): Secondary | ICD-10-CM | POA: Diagnosis present

## 2021-07-12 DIAGNOSIS — Z96642 Presence of left artificial hip joint: Secondary | ICD-10-CM | POA: Diagnosis present

## 2021-07-12 DIAGNOSIS — R1310 Dysphagia, unspecified: Secondary | ICD-10-CM | POA: Diagnosis present

## 2021-07-12 DIAGNOSIS — I951 Orthostatic hypotension: Secondary | ICD-10-CM | POA: Diagnosis present

## 2021-07-12 DIAGNOSIS — I10 Essential (primary) hypertension: Secondary | ICD-10-CM | POA: Diagnosis present

## 2021-07-12 DIAGNOSIS — G4489 Other headache syndrome: Secondary | ICD-10-CM | POA: Diagnosis present

## 2021-07-12 DIAGNOSIS — Z7982 Long term (current) use of aspirin: Secondary | ICD-10-CM

## 2021-07-12 DIAGNOSIS — Z681 Body mass index (BMI) 19 or less, adult: Secondary | ICD-10-CM

## 2021-07-12 DIAGNOSIS — E785 Hyperlipidemia, unspecified: Secondary | ICD-10-CM | POA: Diagnosis present

## 2021-07-12 DIAGNOSIS — K921 Melena: Secondary | ICD-10-CM | POA: Diagnosis not present

## 2021-07-12 DIAGNOSIS — R159 Full incontinence of feces: Secondary | ICD-10-CM | POA: Diagnosis not present

## 2021-07-12 DIAGNOSIS — M549 Dorsalgia, unspecified: Secondary | ICD-10-CM | POA: Diagnosis present

## 2021-07-12 DIAGNOSIS — J309 Allergic rhinitis, unspecified: Secondary | ICD-10-CM | POA: Diagnosis present

## 2021-07-12 DIAGNOSIS — K219 Gastro-esophageal reflux disease without esophagitis: Secondary | ICD-10-CM | POA: Diagnosis present

## 2021-07-12 DIAGNOSIS — M1990 Unspecified osteoarthritis, unspecified site: Secondary | ICD-10-CM | POA: Diagnosis present

## 2021-07-12 DIAGNOSIS — T68XXXA Hypothermia, initial encounter: Secondary | ICD-10-CM

## 2021-07-12 DIAGNOSIS — E43 Unspecified severe protein-calorie malnutrition: Secondary | ICD-10-CM | POA: Diagnosis present

## 2021-07-12 DIAGNOSIS — K922 Gastrointestinal hemorrhage, unspecified: Secondary | ICD-10-CM | POA: Diagnosis not present

## 2021-07-12 DIAGNOSIS — Z923 Personal history of irradiation: Secondary | ICD-10-CM

## 2021-07-12 DIAGNOSIS — I6521 Occlusion and stenosis of right carotid artery: Secondary | ICD-10-CM | POA: Diagnosis present

## 2021-07-12 DIAGNOSIS — E871 Hypo-osmolality and hyponatremia: Secondary | ICD-10-CM | POA: Diagnosis not present

## 2021-07-12 DIAGNOSIS — Z85818 Personal history of malignant neoplasm of other sites of lip, oral cavity, and pharynx: Secondary | ICD-10-CM

## 2021-07-12 DIAGNOSIS — L89152 Pressure ulcer of sacral region, stage 2: Secondary | ICD-10-CM | POA: Diagnosis present

## 2021-07-12 DIAGNOSIS — K5900 Constipation, unspecified: Secondary | ICD-10-CM | POA: Diagnosis not present

## 2021-07-12 DIAGNOSIS — R944 Abnormal results of kidney function studies: Secondary | ICD-10-CM | POA: Diagnosis not present

## 2021-07-12 DIAGNOSIS — Z66 Do not resuscitate: Secondary | ICD-10-CM | POA: Diagnosis present

## 2021-07-12 DIAGNOSIS — E162 Hypoglycemia, unspecified: Secondary | ICD-10-CM | POA: Diagnosis not present

## 2021-07-12 DIAGNOSIS — E039 Hypothyroidism, unspecified: Secondary | ICD-10-CM | POA: Diagnosis present

## 2021-07-12 DIAGNOSIS — D62 Acute posthemorrhagic anemia: Secondary | ICD-10-CM | POA: Diagnosis not present

## 2021-07-12 DIAGNOSIS — R946 Abnormal results of thyroid function studies: Secondary | ICD-10-CM | POA: Diagnosis present

## 2021-07-12 DIAGNOSIS — Z79899 Other long term (current) drug therapy: Secondary | ICD-10-CM

## 2021-07-12 DIAGNOSIS — L8990 Pressure ulcer of unspecified site, unspecified stage: Secondary | ICD-10-CM | POA: Insufficient documentation

## 2021-07-12 DIAGNOSIS — Z20822 Contact with and (suspected) exposure to covid-19: Secondary | ICD-10-CM | POA: Diagnosis present

## 2021-07-12 DIAGNOSIS — G8929 Other chronic pain: Secondary | ICD-10-CM | POA: Diagnosis present

## 2021-07-12 DIAGNOSIS — R680 Hypothermia, not associated with low environmental temperature: Secondary | ICD-10-CM | POA: Diagnosis present

## 2021-07-12 LAB — CBC WITH DIFFERENTIAL/PLATELET
Abs Immature Granulocytes: 0.01 10*3/uL (ref 0.00–0.07)
Basophils Absolute: 0 10*3/uL (ref 0.0–0.1)
Basophils Relative: 0 %
Eosinophils Absolute: 0 10*3/uL (ref 0.0–0.5)
Eosinophils Relative: 0 %
HCT: 31.9 % — ABNORMAL LOW (ref 39.0–52.0)
Hemoglobin: 10.9 g/dL — ABNORMAL LOW (ref 13.0–17.0)
Immature Granulocytes: 0 %
Lymphocytes Relative: 6 %
Lymphs Abs: 0.3 10*3/uL — ABNORMAL LOW (ref 0.7–4.0)
MCH: 31 pg (ref 26.0–34.0)
MCHC: 34.2 g/dL (ref 30.0–36.0)
MCV: 90.6 fL (ref 80.0–100.0)
Monocytes Absolute: 0.6 10*3/uL (ref 0.1–1.0)
Monocytes Relative: 12 %
Neutro Abs: 3.7 10*3/uL (ref 1.7–7.7)
Neutrophils Relative %: 82 %
Platelets: 356 10*3/uL (ref 150–400)
RBC: 3.52 MIL/uL — ABNORMAL LOW (ref 4.22–5.81)
RDW: 15.2 % (ref 11.5–15.5)
WBC: 4.6 10*3/uL (ref 4.0–10.5)
nRBC: 0 % (ref 0.0–0.2)

## 2021-07-12 LAB — COMPREHENSIVE METABOLIC PANEL
ALT: 32 U/L (ref 0–44)
AST: 42 U/L — ABNORMAL HIGH (ref 15–41)
Albumin: 3.3 g/dL — ABNORMAL LOW (ref 3.5–5.0)
Alkaline Phosphatase: 74 U/L (ref 38–126)
Anion gap: 9 (ref 5–15)
BUN: 20 mg/dL (ref 8–23)
CO2: 29 mmol/L (ref 22–32)
Calcium: 9.3 mg/dL (ref 8.9–10.3)
Chloride: 87 mmol/L — ABNORMAL LOW (ref 98–111)
Creatinine, Ser: 0.62 mg/dL (ref 0.61–1.24)
GFR, Estimated: >60 mL/min (ref >60)
Glucose, Bld: 95 mg/dL (ref 70–99)
Potassium: 4.8 mmol/L (ref 3.5–5.1)
Sodium: 125 mmol/L — ABNORMAL LOW (ref 135–145)
Total Bilirubin: 0.6 mg/dL (ref 0.3–1.2)
Total Protein: 6.5 g/dL (ref 6.5–8.1)

## 2021-07-12 LAB — RESP PANEL BY RT-PCR (FLU A&B, COVID) ARPGX2
Influenza A by PCR: NEGATIVE
Influenza B by PCR: NEGATIVE
SARS Coronavirus 2 by RT PCR: NEGATIVE

## 2021-07-12 LAB — MAGNESIUM: Magnesium: 2.1 mg/dL (ref 1.7–2.4)

## 2021-07-12 IMAGING — DX DG CHEST 1V PORT
1 series · 2 of 2 positions shown · non-contrast
Comparison: Chest radiograph dated [DATE].

CLINICAL DATA: Concern for pneumonia.

EXAM:
PORTABLE CHEST 1 VIEW

[Series 1: chest · 0.14mm/px · 2 of 2 slices shown]
[im 1/2]
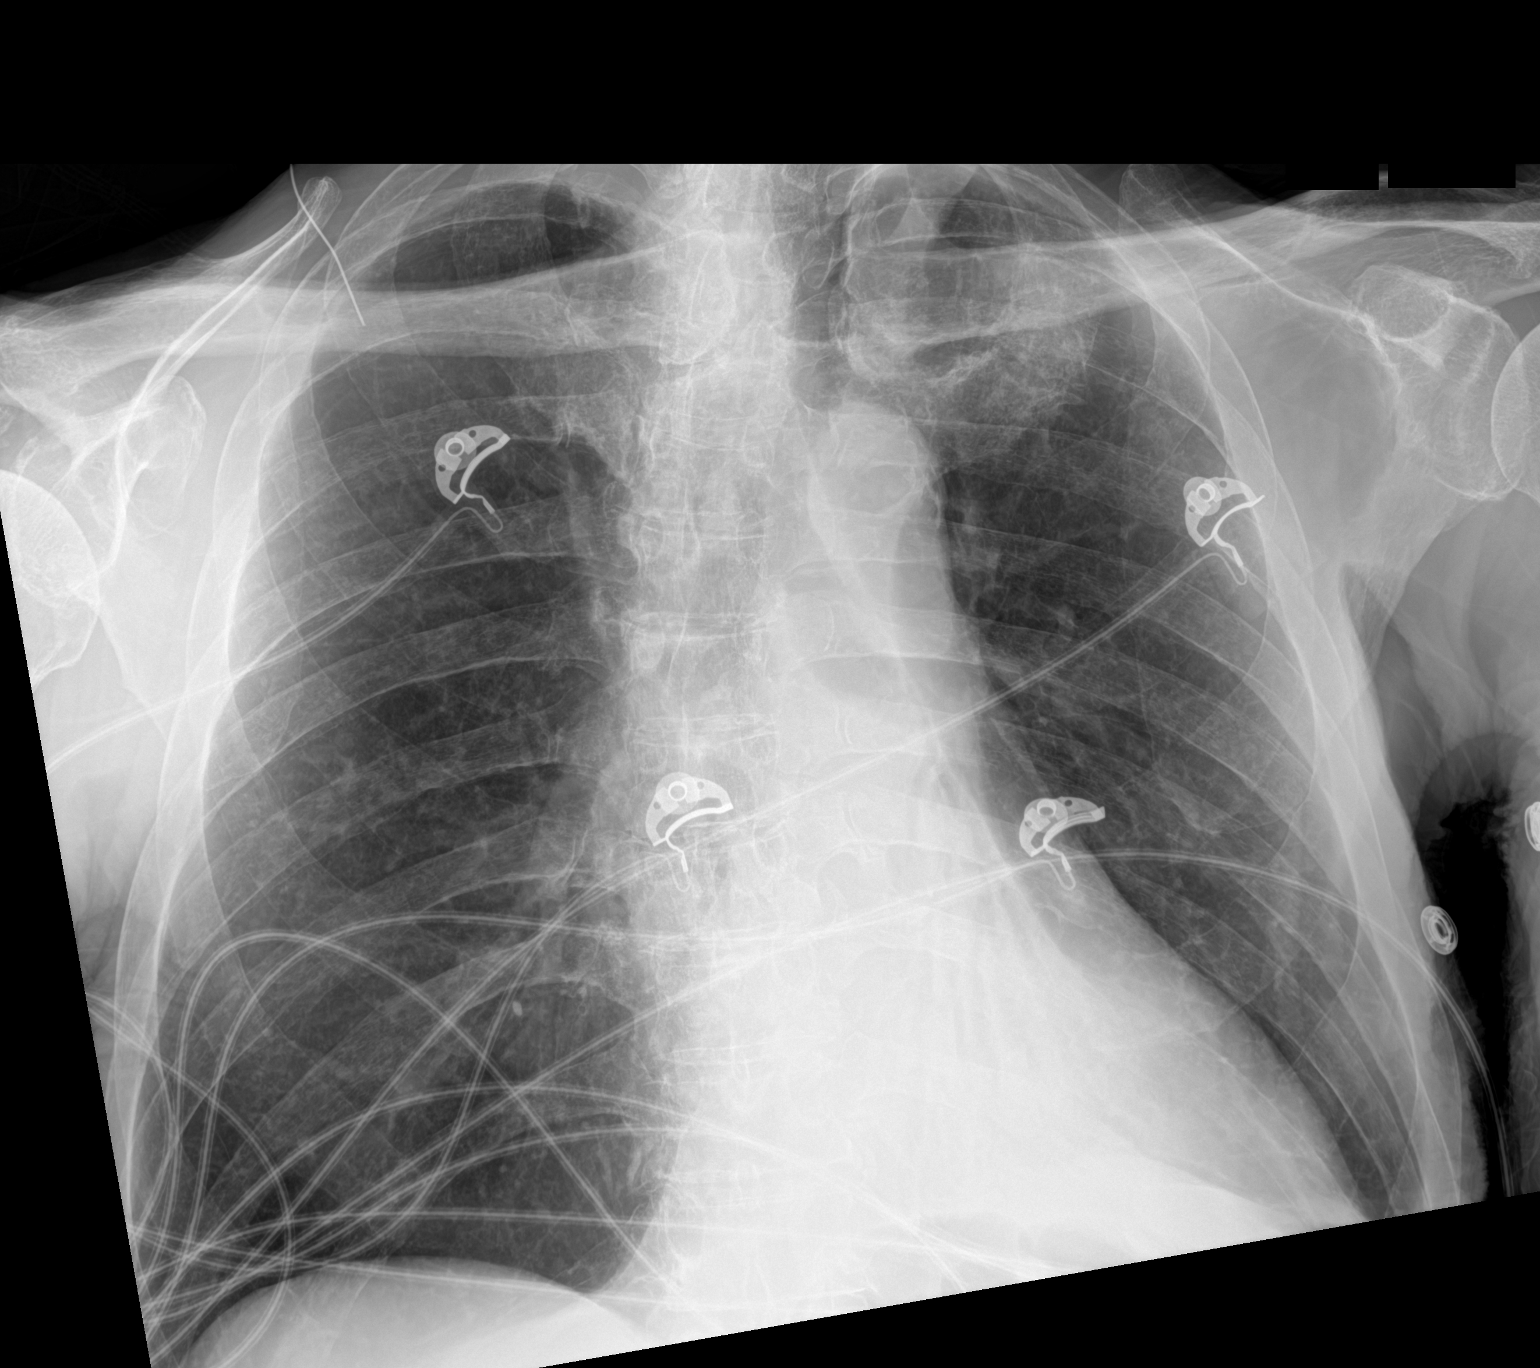
[im 2/2]
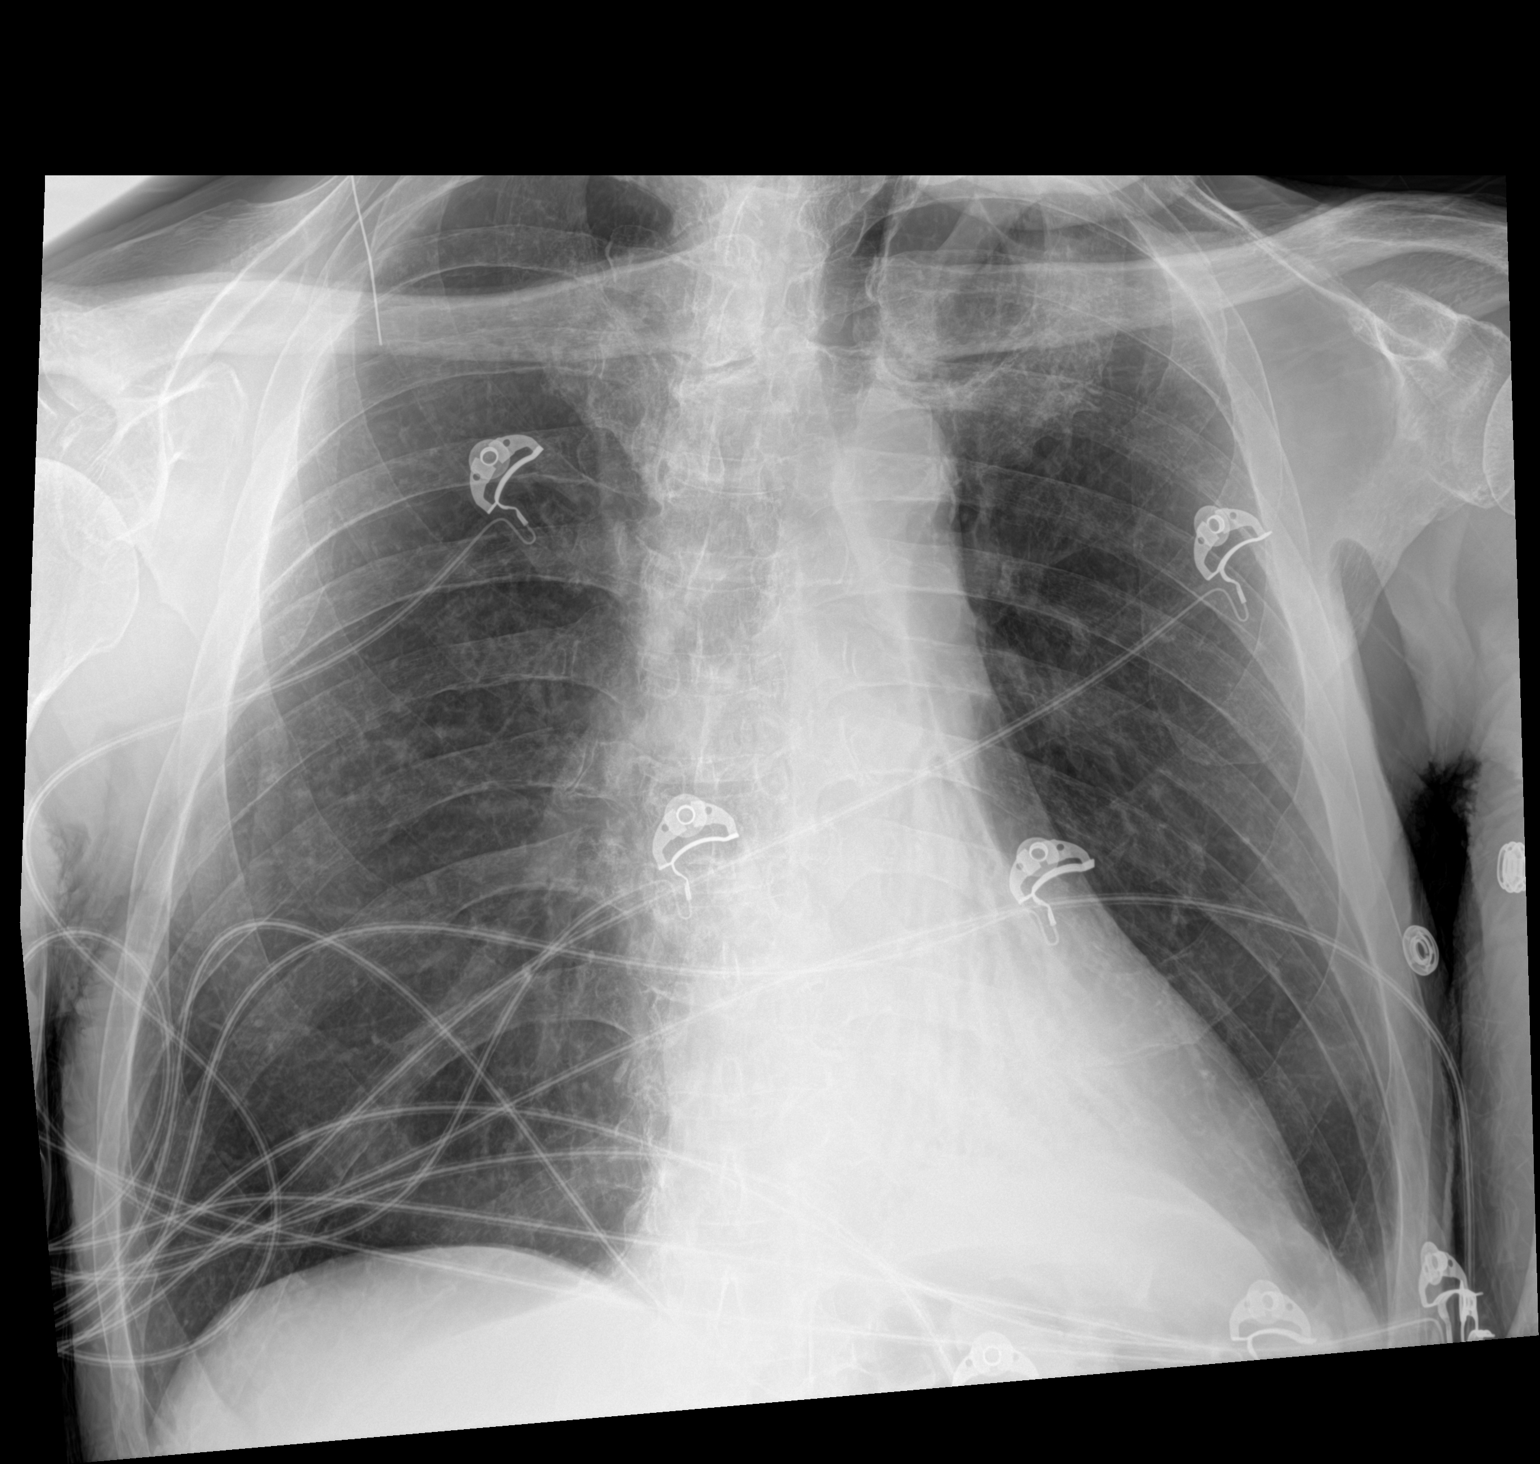

[2 of 2 positions shown; findings below may reference images not displayed]

FINDINGS: Left retrocardiac consolidation with air bronchograms may represent
atelectasis or infiltrate. Clinical correlation recommended. No
large pleural effusion. No pneumothorax. Mild cardiomegaly. No acute
osseous pathology.
IMPRESSION: Left retrocardiac consolidation may represent atelectasis or
infiltrate.

## 2021-07-12 MED ORDER — SODIUM CHLORIDE 0.9 % IV BOLUS
1000.0000 mL | Freq: Once | INTRAVENOUS | Status: AC
  Administered 2021-07-12: 1000 mL via INTRAVENOUS

## 2021-07-12 MED ORDER — KETOROLAC TROMETHAMINE 15 MG/ML IJ SOLN
15.0000 mg | Freq: Once | INTRAMUSCULAR | Status: AC
Start: 2021-07-12 — End: 2021-07-12
  Administered 2021-07-12: 15 mg via INTRAVENOUS
  Filled 2021-07-12: qty 1

## 2021-07-12 MED ORDER — ACETAMINOPHEN 650 MG RE SUPP: 650.0000 mg | Freq: Four times a day (QID) | RECTAL | Status: DC | PRN

## 2021-07-12 MED ORDER — ACETAMINOPHEN 325 MG PO TABS
650.0000 mg | ORAL_TABLET | Freq: Four times a day (QID) | ORAL | Status: DC | PRN
  Administered 2021-07-15 – 2021-07-26 (×5): 650 mg
  Filled 2021-07-12 (×6): qty 2

## 2021-07-12 MED ORDER — LACTATED RINGERS IV BOLUS
1000.0000 mL | Freq: Once | INTRAVENOUS | Status: AC
  Administered 2021-07-13: 1000 mL via INTRAVENOUS

## 2021-07-12 MED ORDER — LACTATED RINGERS IV SOLN: INTRAVENOUS | Status: AC

## 2021-07-12 NOTE — ED Provider Notes (Signed)
[facility] Emergency Department Provider Note MRN:  [id number]  Arrival date & time: 07/13/21     Chief Complaint   Diarrhea   History of Present Illness   BREKKEN BEACH is a 76 y.o. 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  The patient states that his home health team recently increased the frequency and volume when she receives G-tube feeds.  They also have started him on a bowel regimen recently.  Since that time the patient has been having 1 bowel movement per hour.  Patient states that he has felt weak but has not had other symptoms such as fever, chills, abdominal pain, shortness of breath, chest pain.  Of note the patient was recently admitted to the hospital for treatment of a COVID/pneumonia.  Review of Systems  A thorough review of systems was obtained and all systems are negative except as noted in the HPI and PMH.   Patient's Health History    Past Medical History:  Diagnosis Date   Arthritis    Cancer ([place])    Tonsil cancer   Carotid artery stenosis without cerebral infarction 08/30/2015   Difficulty in swallowing    History of radiation therapy    Hypertension    Hypothyroidism 05/09/2014   Other headache syndrome 08/10/2015   Sleep apnea    Weight loss 08/08/2015    Past Surgical History:  Procedure Laterality Date   BACK SURGERY     CAROTID PTA/STENT INTERVENTION Left 08/02/2016   Procedure: Carotid PTA/Stent Intervention;  Surgeon: Elam Dutch, MD;  Location: [place] [facility];  Service: Cardiovascular;  Laterality: Left;   ESOPHAGOGASTRODUODENOSCOPY N/A 11/15/2016   Procedure: ESOPHAGOGASTRODUODENOSCOPY (EGD);  Surgeon: Doran Stabler, MD;  Location: [facility] ENDOSCOPY;  Service: Endoscopy;  Laterality: N/A;   FOREIGN BODY  REMOVAL N/A 11/15/2016   Procedure: FOREIGN BODY REMOVAL;  Surgeon: Doran Stabler, MD;  Location: [place];  Service: Endoscopy;  Laterality: N/A;   LAPAROSCOPIC GASTROSTOMY N/A 06/21/2021   Procedure: LAPAROSCOPIC GASTROSTOMY TUBE PLACEMENT;  Surgeon: Clovis Riley, MD;  Location: [place];  Service: General;  Laterality: N/A;   PERIPHERAL VASCULAR CATHETERIZATION N/A 07/04/2016   Procedure: Aortic Arch Angiography;  Surgeon: Conrad Renfrow, MD;  Location: [place] [facility];  Service: Cardiovascular;  Laterality: N/A;   PERIPHERAL VASCULAR CATHETERIZATION N/A 07/04/2016   Procedure: Carotid & cerebral  Angiography;  Surgeon: Conrad Hines, MD;  Location: [place] [facility];  Service: Cardiovascular;  Laterality: N/A;   TOTAL HIP ARTHROPLASTY Left 10/26/2019   Procedure: TOTAL HIP ARTHROPLASTY ANTERIOR APPROACH;  Surgeon: Paralee Cancel, MD;  Location: [facility];  Service: Orthopedics;  Laterality: Left;  70 mins   TUMOR REMOVAL     in neck    Family History  Problem Relation Age of Onset   Cancer Father        lung cancer   COPD Mother    Macular degeneration Mother    Colon cancer Neg Hx    Stomach cancer Neg Hx    Pancreatic cancer Neg Hx     Social History   Socioeconomic History   Marital status: Married    Spouse name: Not on file   Number of children: Not on file   Years of education: Not on file  Highest education level: Not on file  Occupational History   Occupation: retired  Tobacco Use   Smoking status: Former    Packs/day: 1.00    Years: 35.00    Pack years: 35.00    Types: Cigarettes    Quit date: 2019    Years since quitting: 4.0   Smokeless tobacco: Never  Vaping Use   Vaping Use: Never used  Substance and Sexual Activity   Alcohol use: Not Currently    Alcohol/week: 2.0 standard drinks    Types: 2 Shots of liquor per week    Comment: rarely   Drug use: Not Currently    Types: Marijuana   Sexual activity: Not on file  Other Topics Concern   Not on  file  Social History Narrative   Not on file   Social Determinants of Health   Financial Resource Strain: Not on file  Food Insecurity: Not on file  Transportation Needs: Not on file  Physical Activity: Not on file  Stress: Not on file  Social Connections: Not on file  Intimate Partner Violence: Not on file     Physical Exam   Physical Exam Vitals and nursing note reviewed.  Constitutional:      Appearance: Normal appearance. He is normal weight. He is ill-appearing.     Comments: Cool to the touch   HENT:     Head: Normocephalic and atraumatic.     Nose: Nose normal.     Mouth/Throat:     Mouth: Mucous membranes are dry.  Eyes:     Extraocular Movements: Extraocular movements intact.     Pupils: Pupils are equal, round, and reactive to light.  Cardiovascular:     Rate and Rhythm: Normal rate and regular rhythm.  Pulmonary:     Effort: Pulmonary effort is normal.     Breath sounds: Normal breath sounds.  Abdominal:     General: Abdomen is flat. There is no distension.     Palpations: Abdomen is soft. There is no mass.     Tenderness: There is no abdominal tenderness.     Comments: Gtube intact. Clean dry and intact   Musculoskeletal:     Right lower leg: No edema.     Left lower leg: No edema.  Skin:    General: Skin is dry.     Capillary Refill: Capillary refill takes less than 2 seconds.  Neurological:     General: No focal deficit present.     Mental Status: He is alert and oriented to person, place, and time. Mental status is at baseline.  Psychiatric:        Mood and Affect: Mood normal.      Diagnostic and Interventional Summary     Labs Reviewed  CBC WITH DIFFERENTIAL/PLATELET - Abnormal; Notable for the following components:      Result Value   RBC 3.52 (*)    Hemoglobin 10.9 (*)    HCT 31.9 (*)    Lymphs Abs 0.3 (*)    All other components within normal limits  COMPREHENSIVE METABOLIC PANEL - Abnormal; Notable for the following components:    Sodium 125 (*)    Chloride 87 (*)    Albumin 3.3 (*)    AST 42 (*)    All other components within normal limits  RESP PANEL BY RT-PCR (FLU A&B, COVID) [id number]  CULTURE, BLOOD (ROUTINE X 2)  CULTURE, BLOOD (ROUTINE X 2)  C DIFFICILE QUICK SCREEN W PCR REFLEX    MAGNESIUM  PHOSPHORUS  MAGNESIUM  COMPREHENSIVE METABOLIC PANEL  CBC WITH DIFFERENTIAL/PLATELET    DG Chest Portable 1 View  Final Result      Medications  acetaminophen (TYLENOL) tablet 650 mg (has no administration in time range)    Or  acetaminophen (TYLENOL) suppository 650 mg (has no administration in time range)  lactated ringers infusion (has no administration in time range)  morphine 2 MG/ML injection 2 mg (has no administration in time range)  ketorolac (TORADOL) 15 MG/ML injection 15 mg (15 mg Intravenous Given 07/12/21 2144)  sodium chloride 0.9 % bolus 1,000 mL (0 mLs Intravenous Stopped 07/12/21 2308)  lactated ringers bolus 1,000 mL (1,000 mLs Intravenous New Bag/Given 07/13/21 0014)     Procedures  /  Critical Care Procedures  ED Course and Medical Decision Making  Initial Impression and Ddx This is a 76 year old male presents to emergency department for evaluation of diarrhea. Differential includes but sounds to the following: Infectious, viral infectious, bacterial infectious, parasitic, toxin mediated, GI bleed.  I suspect the cause of the patient's diarrhea is the fact that he has been eating more recently and was started on a bowel regimen.  However I do have concern for infection given that the patient was noted to be hypothermic when he arrived in the emergency department.  Patient was then placed on a Bair hugger and received warmed IV fluids.  This increased the patient's temperature.  We will also obtain C. difficile PCR culture for further evaluation.  Past medical/surgical history that increases complexity of ED encounter: G-tube placement  Interpretation of Diagnostics I personally  reviewed the EKG, Chest Xray, and Cardiac Monitor and my interpretation is as follows: Patient was in sinus rhythm on the cardiac monitor.  Chest x-ray was negative for cardio pulmonary abnormalities.    The patient's temperature continued to improve while he was in the emergency department.  The patient's lab work-up was overall unremarkable.  No leukocytosis noted on CBC.  Patient did have a sodium of 125.  Blood cultures x2 were obtained.  Patient Reassessment and Ultimate Disposition/Management Given that the patient was hypothermic and has been having large volumes of diarrhea will admit the patient to the hospitalist service for further observation.  They were in agreement this plan.  Procedure note for further details.  Patient management required discussion with the following services or consulting groups:  Hospitalist Service  Complexity of Problems Addressed Acute illness or injury that poses threat of life of bodily function  Additional Data Reviewed and Analyzed Further history obtained from: Recent PCP notes as per above    Final Clinical Impressions(s) / ED Diagnoses     ICD-10-CM   1. Diarrhea, unspecified type  R19.7       ED Discharge Orders     None        Discharge Instructions Discussed with and Provided to Patient:   Discharge Instructions   None       Zachery Dakins, MD 07/13/21 Judithann Sauger    Carmin Muskrat, MD 07/18/21 [phone]

## 2021-07-12 NOTE — H&P (Signed)
History and Physical    PLEASE NOTE THAT DRAGON DICTATION SOFTWARE WAS USED IN THE CONSTRUCTION OF THIS NOTE.   Alexander Duran QIO:[id number] DOB: [birth year] DOA: 07/12/2021  PCP: Windy Fast, MD  Patient coming from: home   I have personally briefly reviewed patient's old medical records in [place]  Chief Complaint: Diarrhea  HPI: Alexander Duran is a 75 y.o. 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   In setting of oropharyngeal cancer the patient receives his nutrition via G-tube feeds.  He notes significant increase in rate of these associated feeds approximately 5 days ago concomitant with up regulation of oral bowel regimen at that time.  Subsequently, he notes increasing frequency of loose stools over the last 3 to 4 days, noting at least 3-4 such episodes of nonbloody, loose stool per day over that timeframe, with most recent episode occurring just prior to presenting to vascular emergency department today.  Associate with any abdominal discomfort, fever, or abdominal discomfort.  Denies any recent trauma, travel, rash.  Not associate with any nausea, vomiting.  Denies any associated full body rigors, generalized myalgias, or chills.  He also denies any recent chest pain, palpitations, diaphoresis, palpitations, shortness of breath.   Of note, patient was recently hospitalized starting 06/17/2021 for COVID-19 infection, with associated positive COVID-19 test on the day of admission.  Temperature review, most recent prior serum sodium level was noted to be 138 on 06/22/2021, it was also noted to be 120 on 06/17/2021.  Prior to conversion to exclusive feeds via the G-tube, the patient had a history of recurrent aspiration pneumonia, and was  reportedly on associated antibiotic coverage approximately 2 months ago for this.  In the setting of a history of GERD, he is also on daily Protonix therapy.     ED Course:  Vital signs in the ED were notable for the following: Heart rate 59; blood pressure 105/61 - 146/85; respiratory rate 15, oxygen saturation 97 to 100% on room air.  Labs were notable for the following: CMP was notable for the following: Sodium 125 potassium 4.8, bicarbonate 29, creatinine 0.60 relative to most recent prior creatinine did 1 at 0.54 on 06/22/2021, and liver enzymes found to be within normal limits.  CBC notable for white blood cell count 4600.  CT PCR was ordered in the ED this evening, with result currently pending.  COVID-19/influenza PCR were rechecked in the ED this evening, and found to be negative.  Imaging and additional notable ED work-up: EKG, in comparison to most recent prior from 06/19/2021 demonstrated sinus rhythm with heart rate 53, right bundle branch block, nonspecific T wave inversion in V1/V2, which appears unchanged relativeto prior EKG, no evidence of ST changes, including no evidence of ST elevation.  Chest x-ray showed left retrocardiac consolidation suggestive atelectasis with differentials including infiltrate, will demonstrating no evidence of edema, pleural effusion, or pneumothorax.  While in the ED, the following were administered: Normal saline x1 L bolus, Toradol 15 mg IV x1.    Subsequently, the patient was admitted for overnight observation for further evaluation management presenting dehydration in setting of diarrhea, complicated by generalized weakness and acute hyponatremia.     Review of Systems: As per HPI otherwise 10 point review of systems negative.   Past Medical History:  Diagnosis  Date   Arthritis    Cancer [facility])    Tonsil cancer   Carotid artery stenosis without cerebral infarction 08/30/2015   Difficulty in swallowing    History of radiation therapy     Hypertension    Hypothyroidism 05/09/2014   Other headache syndrome 08/10/2015   Sleep apnea    Weight loss 08/08/2015    Past Surgical History:  Procedure Laterality Date   BACK SURGERY     CAROTID PTA/STENT INTERVENTION Left 08/02/2016   Procedure: Carotid PTA/Stent Intervention;  Surgeon: Elam Dutch, MD;  Location: [place] CV LAB;  Service: Cardiovascular;  Laterality: Left;   ESOPHAGOGASTRODUODENOSCOPY N/A 11/15/2016   Procedure: ESOPHAGOGASTRODUODENOSCOPY (EGD);  Surgeon: Doran Stabler, MD;  Location: [facility] ENDOSCOPY;  Service: Endoscopy;  Laterality: N/A;   FOREIGN BODY REMOVAL N/A 11/15/2016   Procedure: FOREIGN BODY REMOVAL;  Surgeon: Doran Stabler, MD;  Location: [place];  Service: Endoscopy;  Laterality: N/A;   LAPAROSCOPIC GASTROSTOMY N/A 06/21/2021   Procedure: LAPAROSCOPIC GASTROSTOMY TUBE PLACEMENT;  Surgeon: Clovis Riley, MD;  Location: [place];  Service: General;  Laterality: N/A;   PERIPHERAL VASCULAR CATHETERIZATION N/A 07/04/2016   Procedure: Aortic Arch Angiography;  Surgeon: Conrad Underwood, MD;  Location: [place] CV LAB;  Service: Cardiovascular;  Laterality: N/A;   PERIPHERAL VASCULAR CATHETERIZATION N/A 07/04/2016   Procedure: Carotid & cerebral  Angiography;  Surgeon: Conrad Norvelt, MD;  Location: [place] CV LAB;  Service: Cardiovascular;  Laterality: N/A;   TOTAL HIP ARTHROPLASTY Left 10/26/2019   Procedure: TOTAL HIP ARTHROPLASTY ANTERIOR APPROACH;  Surgeon: Paralee Cancel, MD;  Location: [facility] ORS;  Service: Orthopedics;  Laterality: Left;  70 mins   TUMOR REMOVAL     in neck    Social History:  reports that he quit smoking about 4 years ago. His smoking use included cigarettes. He has a 35.00 pack-year smoking history. He has never used smokeless tobacco. He reports that he does not currently use alcohol after a past usage of about 2.0 standard drinks per week. He reports that he does not currently use drugs after having used the following  drugs: Marijuana.   Allergies  Allergen Reactions   Iodinated Contrast Media     Other reaction(s): Weakness present    Family History  Problem Relation Age of Onset   Cancer Father        lung cancer   COPD Mother    Macular degeneration Mother    Colon cancer Neg Hx    Stomach cancer Neg Hx    Pancreatic cancer Neg Hx     Family history reviewed and not pertinent    Prior to Admission medications   Medication Sig Start Date End Date Taking? Authorizing Provider  acetaminophen (TYLENOL) 160 MG/5ML suspension Place 20.3 mLs (650 mg total) into feeding tube every 8 (eight) hours as needed for mild pain. 06/27/21   Ghimire, Henreitta Leber, MD  albuterol (VENTOLIN HFA) 108 (90 Base) MCG/ACT inhaler Inhale 2 puffs into the lungs every 6 (six) hours as needed for wheezing or shortness of breath.    [provider]  aspirin 81 MG chewable tablet Chew 81 mg by mouth every evening.     [provider]  atorvastatin (LIPITOR) 20 MG tablet Take 20 mg by mouth daily.    [provider]  fluticasone (FLONASE) 50 MCG/ACT nasal spray Place 2 sprays into both nostrils daily. 06/28/21   Ghimire, Henreitta Leber, MD  levothyroxine (SYNTHROID, [place])  75 MCG tablet Take 75 mcg by mouth daily before breakfast.     [provider]  loratadine (CLARITIN) 10 MG tablet Place 1 tablet (10 mg total) into feeding tube daily. 06/28/21   Ghimire, Henreitta Leber, MD  midodrine (PROAMATINE) 5 MG tablet Place 1 tablet (5 mg total) into feeding tube 3 (three) times daily with meals. 06/27/21   Ghimire, Henreitta Leber, MD  Nutritional Supplements (FEEDING SUPPLEMENT, OSMOLITE 1.5 CAL,) LIQD Place 355 mLs into feeding tube 4 (four) times daily. 06/27/21 09/25/21  Ghimire, Henreitta Leber, MD  OVER THE COUNTER MEDICATION TheraTears Liquid Eye Drops- Place 1-2 drops into both eyes one to three times as day as needed for dryness or irritation    [provider]  pantoprazole sodium (PROTONIX) 40 mg  Place 40 mg into feeding tube daily. 06/27/21 09/25/21  Jonetta Osgood, MD     Objective    Physical Exam: Vitals:   07/12/21 2245 07/12/21 2308 07/12/21 2315 07/12/21 2318  BP: 105/61  96/62   Pulse: (!) 58  (!) 48   Resp: (!) 8  (!) 8   Temp:  (!) 94 F (34.4 C)  (!) 94.3 F (34.6 C)  TempSrc:  Oral  Rectal  SpO2: 97%  99%     General: appears to be stated age; alert, oriented Skin: warm, dry, no rash Head:  AT/[place] Mouth:  Oral mucosa membranes appear dry, normal dentition Neck: supple; trachea midline Heart:  RRR; did not appreciate any M/R/G Lungs: CTAB, did not appreciate any wheezes, rales, or rhonchi Abdomen: + BS; soft, ND, NT; G-tube noted Vascular: 2+ pedal pulses b/l; 2+ radial pulses b/l Extremities: no peripheral edema, no muscle wasting Neuro: strength and sensation intact in upper and lower extremities b/l  Labs on Admission: I have personally reviewed following labs and imaging studies  CBC: Recent Labs  Lab 07/12/21 2126  WBC 4.6  NEUTROABS 3.7  HGB 10.9*  HCT 31.9*  MCV 90.6  PLT 854   Basic Metabolic Panel: Recent Labs  Lab 07/12/21 2126  NA 125*  K 4.8  CL 87*  CO2 29  GLUCOSE 95  BUN 20  CREATININE 0.62  CALCIUM 9.3  MG 2.1   GFR: CrCl cannot be calculated (Unknown ideal weight.). Liver Function Tests: Recent Labs  Lab 07/12/21 2126  AST 42*  ALT 32  ALKPHOS 74  BILITOT 0.6  PROT 6.5  ALBUMIN 3.3*   No results for input(s): LIPASE, AMYLASE in the last 168 hours. No results for input(s): AMMONIA in the last 168 hours. Coagulation Profile: No results for input(s): INR, PROTIME in the last 168 hours. Cardiac Enzymes: No results for input(s): CKTOTAL, CKMB, CKMBINDEX, TROPONINI in the last 168 hours. BNP (last 3 results) No results for input(s): PROBNP in the last 8760 hours. HbA1C: No results for input(s): HGBA1C in the last 72 hours. CBG: No results for input(s): GLUCAP in the last 168 hours. Lipid Profile: No  results for input(s): CHOL, HDL, LDLCALC, TRIG, CHOLHDL, LDLDIRECT in the last 72 hours. Thyroid Function Tests: No results for input(s): TSH, T4TOTAL, FREET4, T3FREE, THYROIDAB in the last 72 hours. Anemia Panel: No results for input(s): VITAMINB12, FOLATE, FERRITIN, TIBC, IRON, RETICCTPCT in the last 72 hours. Urine analysis:    Component Value Date/Time   COLORURINE YELLOW 06/20/2021 2227   APPEARANCEUR HAZY (A) 06/20/2021 2227   LABSPEC 1.030 06/20/2021 2227   PHURINE 5.0 06/20/2021 2227   GLUCOSEU NEGATIVE 06/20/2021 2227   HGBUR NEGATIVE 06/20/2021  [place] 06/20/2021 2227   KETONESUR 20 (A) 06/20/2021 2227   PROTEINUR NEGATIVE 06/20/2021 2227   NITRITE NEGATIVE 06/20/2021 2227   LEUKOCYTESUR NEGATIVE 06/20/2021 2227    Radiological Exams on Admission: DG Chest Portable 1 View  Result Date: 07/12/2021 CLINICAL DATA:  Concern for pneumonia. EXAM: PORTABLE CHEST 1 VIEW COMPARISON:  Chest radiograph dated 06/17/2021. FINDINGS: Left retrocardiac consolidation with air bronchograms may represent atelectasis or infiltrate. Clinical correlation recommended. No large pleural effusion. No pneumothorax. Mild cardiomegaly. No acute osseous pathology. IMPRESSION: Left retrocardiac consolidation may represent atelectasis or infiltrate. Electronically Signed   By: Anner Crete M.D.   On: 07/12/2021 22:11     EKG: Independently reviewed, with result as described above.    Assessment/Plan   Principal Problem:   Dehydration Active Problems:   Hypothyroidism   Diarrhea   Generalized weakness   Acute hyponatremia   HLD (hyperlipidemia)       #) Dehydration: Clinical suspicion for such, including the appearance of dry oral mucous membranes as well as laboratory findings notable for acute prerenal azotemia.  Appears to be in the setting of   significant increase in GI losses in the form of diarrhea over the last 3 to 4 days, without ability to increase oral  consumption of water in the context of chronic n.p.o. status due to dysphagia associated with his oropharyngeal cancer.    Plan: Monitor strict I's and O's.  Daily weights.  Repeat BMP in the morning. IVF's in form of lactated Ringer's at 75 cc/h x 10 hours.  Further evaluation management of presenting acute diarrhea, as further detailed below.      #) Acute Diarrhea: 3-4 daily episodes of loose, nonbloody stool over the last 3 to 4 days.  Appears to coincide with preceding increase in rate of G-tube feeds along with pharmacologic exacerbation from concomitant up regulation of outpatient oral bowel regimen.  Differential includes infectious possibility, including the possibility of C. difficile, although this appears less likely relative to the above , including in the complete absence of any associated abdominal discomfort.  However, the patient does possess multiple risk factors for acquiring C. difficile, including preceding antibiotic use, as further detailed above, as well as chronic use of PPI, which increased risk for translocation of gut bacteria.  C. difficile PCR was ordered in the ED this evening, with result currently pending.  Does not meet SIRS criteria for sepsis.  We will follow for results of C. difficile PCR, will providing IV fluid rehydration efforts, and consult dietary services for their assistance with potential readjustment of associated G-tube feeding.   Plan: Follow for results of stool studies, as above. Will refrain from use of anti-motility agents until underlying infectious source as been ruled-out.  Additionally, hold home oral bowel regimen.  There does not appear to an indication for initiation of abx at this time. Enteric precautions. Gentle IVF's. Close monitoring of ensuing electrolytes and renal function with repeat CMP in the morning. Check Mg. Repeat CBC in the morning. Check TSH. Monitor strict I&O's and daily weights. Hold potential contributory meds, including  outpatient Protonix.       #) Generalized weakness: 2 to 3 days duration of generalized weakness, in the absence of any evidence of acute focal neurologic deficits, including no evidence of acute focal weakness.  Suspect contribution from dehydration as a consequence of the recent increase in GI losses in the form of diarrhea, as further detailed above. No e/o additional infectious process  at this time, including COVID-19/influenza PCR negative when checked in the ED today.  Chest x-ray was suggestive of retrocardiac consolidation most consistent with atelectasis, although differential also included infiltrate.  However, clinically, presentation appears less suggestive of underlying pneumonia, particularly in the setting of interval resolution of previous respiratory symptoms in the context of recent COVID-19 infection.  While monitoring the patient is at increased risk for secondary bacterial pneumonia given the close context to recently resolved COVID-19 infection, his overall clinical presentation is not suggestive of underlying pneumonia at this time.   Plan: work-up and management of presenting dehydration, as described above. PT/OT consults ordered for the AM. Fall precautions. Check TSH, MMA. CMP/CBC in the AM.  Add on procalcitonin check urinalysis f delete that.        #) Acute hypo-osmolar hypovolemic hyponatremia: Presenting serum sodium of 125 relative to most recent prior value 138 on 06/22/2021. Suspect an element of hypovolumeia, with suspected contribution from dehydration in the setting of clinical evidence of such as well as report of recent increase in GI losses in the form of diarrhea coinciding with the timeframe of decline of serum sodium levels.  Differential also includes the possibility of a contribution from SIADH in the context of recent COVID-19 infection along with present chest x-ray showing evidence of left retrocardiac consolidation, as above. in general, will provide  gentle IV fluids to attend to suspected contribution from dehydration, while further evaluating for any additional contributing factors, including SIADH, as further detailed below. Will also TSH. No overt pharmacologic contributions. Of note, no evidence of associated acute focal neurologic deficits and no report of recent trauma.     Plan: monitor strict I's and O's and daily weights.  check UA, random urine sodium, urine osmolality.  Check serum osmolality to confirm suspected hypoosmolar etiology.  Repeat BMP in the morning. Check TSH. Gentle IVF's in the form of lactated Ringer's at 75 cc/h x 10 hours. Check serum uric acid level, as SIADH can be associated with hypouricemia due to hyperuricuria.  Add on procalcitonin.  Further evaluation and management of diarrhea, as detailed above.           #) acquired hypothyroidism: documented h/o such, on Synthroid as outpatient.   Plan: cont home Synthroid.         #) Hyperlipidemia: documented h/o such. On atorvastatin as outpatient.    Plan: continue home statin.         #) Chronic back pain: Patient reports a history of such, for which she received a dose of IV Toradol in the ED this evening.  However, the context of acute hyponatremia, and increased risk for exacerbation of hyponatremia from NSAIDs due to associated fluid retention, will refrain from administration of additional NSAIDs at this time, pending further work-up for acute hyponatremia.  Plan: Holding NSAIDs for now.  Prn IV morphine.        #) Allergic Rhinitis: documented h/o such, on scheduled intranasal Flonase as outpatient as well as Claritin.    Plan: cont home Flonase and Claritin.      DVT prophylaxis: SCD's   Code Status: DNR (per documentation chart, and confirmed per my discussions with the patient this evening) Family Communication: none Disposition Plan: Per Rounding Team Consults called: none;  Admission status:  Observation;   PLEASE NOTE THAT DRAGON DICTATION SOFTWARE WAS USED IN THE CONSTRUCTION OF THIS NOTE.   [place] DO [facility]  From [place]   07/12/2021, 11:34 PM

## 2021-07-12 NOTE — ED Triage Notes (Signed)
Pt bib [facility] from home for uncontrollable diarrhea starting this morning. Pt took stool softener yesterday for constipation. Increased food intake via feeding tube. Denies blood in stool or abdominal pain. VSS per ems.

## 2021-07-12 NOTE — ED Notes (Addendum)
Bair hugger placed on patient and warm fluids infusing.

## 2021-07-13 ENCOUNTER — Observation Stay (HOSPITAL_COMMUNITY): Payer: No Typology Code available for payment source

## 2021-07-13 ENCOUNTER — Encounter (HOSPITAL_COMMUNITY): Payer: Self-pay | Admitting: Internal Medicine

## 2021-07-13 DIAGNOSIS — I10 Essential (primary) hypertension: Secondary | ICD-10-CM | POA: Diagnosis present

## 2021-07-13 DIAGNOSIS — I6521 Occlusion and stenosis of right carotid artery: Secondary | ICD-10-CM | POA: Diagnosis present

## 2021-07-13 DIAGNOSIS — E860 Dehydration: Principal | ICD-10-CM

## 2021-07-13 DIAGNOSIS — L8990 Pressure ulcer of unspecified site, unspecified stage: Secondary | ICD-10-CM | POA: Insufficient documentation

## 2021-07-13 DIAGNOSIS — R531 Weakness: Secondary | ICD-10-CM | POA: Diagnosis not present

## 2021-07-13 DIAGNOSIS — Z7189 Other specified counseling: Secondary | ICD-10-CM | POA: Diagnosis not present

## 2021-07-13 DIAGNOSIS — E785 Hyperlipidemia, unspecified: Secondary | ICD-10-CM | POA: Diagnosis present

## 2021-07-13 DIAGNOSIS — E861 Hypovolemia: Secondary | ICD-10-CM | POA: Diagnosis present

## 2021-07-13 DIAGNOSIS — E871 Hypo-osmolality and hyponatremia: Secondary | ICD-10-CM | POA: Diagnosis not present

## 2021-07-13 DIAGNOSIS — T68XXXA Hypothermia, initial encounter: Secondary | ICD-10-CM

## 2021-07-13 DIAGNOSIS — D62 Acute posthemorrhagic anemia: Secondary | ICD-10-CM | POA: Diagnosis not present

## 2021-07-13 DIAGNOSIS — Z515 Encounter for palliative care: Secondary | ICD-10-CM | POA: Diagnosis not present

## 2021-07-13 DIAGNOSIS — G629 Polyneuropathy, unspecified: Secondary | ICD-10-CM | POA: Diagnosis present

## 2021-07-13 DIAGNOSIS — Z681 Body mass index (BMI) 19 or less, adult: Secondary | ICD-10-CM | POA: Diagnosis not present

## 2021-07-13 DIAGNOSIS — R680 Hypothermia, not associated with low environmental temperature: Secondary | ICD-10-CM | POA: Diagnosis present

## 2021-07-13 DIAGNOSIS — Z20822 Contact with and (suspected) exposure to covid-19: Secondary | ICD-10-CM | POA: Diagnosis present

## 2021-07-13 DIAGNOSIS — Z931 Gastrostomy status: Secondary | ICD-10-CM | POA: Diagnosis not present

## 2021-07-13 DIAGNOSIS — R197 Diarrhea, unspecified: Secondary | ICD-10-CM | POA: Diagnosis present

## 2021-07-13 DIAGNOSIS — R471 Dysarthria and anarthria: Secondary | ICD-10-CM | POA: Diagnosis present

## 2021-07-13 DIAGNOSIS — K5900 Constipation, unspecified: Secondary | ICD-10-CM | POA: Diagnosis not present

## 2021-07-13 DIAGNOSIS — I9589 Other hypotension: Secondary | ICD-10-CM | POA: Diagnosis not present

## 2021-07-13 DIAGNOSIS — K921 Melena: Secondary | ICD-10-CM | POA: Diagnosis not present

## 2021-07-13 DIAGNOSIS — R64 Cachexia: Secondary | ICD-10-CM | POA: Diagnosis present

## 2021-07-13 DIAGNOSIS — Z66 Do not resuscitate: Secondary | ICD-10-CM | POA: Diagnosis present

## 2021-07-13 DIAGNOSIS — R200 Anesthesia of skin: Secondary | ICD-10-CM | POA: Diagnosis not present

## 2021-07-13 DIAGNOSIS — Z85818 Personal history of malignant neoplasm of other sites of lip, oral cavity, and pharynx: Secondary | ICD-10-CM | POA: Diagnosis not present

## 2021-07-13 DIAGNOSIS — E039 Hypothyroidism, unspecified: Secondary | ICD-10-CM | POA: Diagnosis present

## 2021-07-13 DIAGNOSIS — Z923 Personal history of irradiation: Secondary | ICD-10-CM | POA: Diagnosis not present

## 2021-07-13 DIAGNOSIS — J189 Pneumonia, unspecified organism: Secondary | ICD-10-CM | POA: Diagnosis present

## 2021-07-13 DIAGNOSIS — I951 Orthostatic hypotension: Secondary | ICD-10-CM | POA: Diagnosis not present

## 2021-07-13 DIAGNOSIS — L89152 Pressure ulcer of sacral region, stage 2: Secondary | ICD-10-CM | POA: Diagnosis present

## 2021-07-13 DIAGNOSIS — E43 Unspecified severe protein-calorie malnutrition: Secondary | ICD-10-CM | POA: Diagnosis present

## 2021-07-13 DIAGNOSIS — Z8616 Personal history of COVID-19: Secondary | ICD-10-CM | POA: Diagnosis not present

## 2021-07-13 DIAGNOSIS — R042 Hemoptysis: Secondary | ICD-10-CM | POA: Diagnosis not present

## 2021-07-13 LAB — URINALYSIS, COMPLETE (UACMP) WITH MICROSCOPIC
Bacteria, UA: NONE SEEN
Bilirubin Urine: NEGATIVE
Glucose, UA: NEGATIVE mg/dL
Hgb urine dipstick: NEGATIVE
Ketones, ur: NEGATIVE mg/dL
Leukocytes,Ua: NEGATIVE
Nitrite: NEGATIVE
Protein, ur: NEGATIVE mg/dL
Specific Gravity, Urine: 1.016 (ref 1.005–1.030)
pH: 6 (ref 5.0–8.0)

## 2021-07-13 LAB — BASIC METABOLIC PANEL
Anion gap: 8 (ref 5–15)
BUN: 13 mg/dL (ref 8–23)
CO2: 24 mmol/L (ref 22–32)
Calcium: 8.5 mg/dL — ABNORMAL LOW (ref 8.9–10.3)
Chloride: 92 mmol/L — ABNORMAL LOW (ref 98–111)
Creatinine, Ser: 0.55 mg/dL — ABNORMAL LOW (ref 0.61–1.24)
GFR, Estimated: >60 mL/min (ref >60)
Glucose, Bld: 61 mg/dL — ABNORMAL LOW (ref 70–99)
Potassium: 4.3 mmol/L (ref 3.5–5.1)
Sodium: 124 mmol/L — ABNORMAL LOW (ref 135–145)

## 2021-07-13 LAB — CBC WITH DIFFERENTIAL/PLATELET
Abs Immature Granulocytes: 0.01 10*3/uL (ref 0.00–0.07)
Basophils Absolute: 0 10*3/uL (ref 0.0–0.1)
Basophils Relative: 1 %
Eosinophils Absolute: 0 10*3/uL (ref 0.0–0.5)
Eosinophils Relative: 1 %
HCT: 26.9 % — ABNORMAL LOW (ref 39.0–52.0)
Hemoglobin: 9.4 g/dL — ABNORMAL LOW (ref 13.0–17.0)
Immature Granulocytes: 0 %
Lymphocytes Relative: 13 %
Lymphs Abs: 0.5 10*3/uL — ABNORMAL LOW (ref 0.7–4.0)
MCH: 31.6 pg (ref 26.0–34.0)
MCHC: 34.9 g/dL (ref 30.0–36.0)
MCV: 90.6 fL (ref 80.0–100.0)
Monocytes Absolute: 0.6 10*3/uL (ref 0.1–1.0)
Monocytes Relative: 15 %
Neutro Abs: 2.9 10*3/uL (ref 1.7–7.7)
Neutrophils Relative %: 70 %
Platelets: 323 10*3/uL (ref 150–400)
RBC: 2.97 MIL/uL — ABNORMAL LOW (ref 4.22–5.81)
RDW: 15.4 % (ref 11.5–15.5)
WBC: 4.1 10*3/uL (ref 4.0–10.5)
nRBC: 0 % (ref 0.0–0.2)

## 2021-07-13 LAB — COMPREHENSIVE METABOLIC PANEL
ALT: 27 U/L (ref 0–44)
AST: 34 U/L (ref 15–41)
Albumin: 2.8 g/dL — ABNORMAL LOW (ref 3.5–5.0)
Alkaline Phosphatase: 59 U/L (ref 38–126)
Anion gap: 7 (ref 5–15)
BUN: 16 mg/dL (ref 8–23)
CO2: 25 mmol/L (ref 22–32)
Calcium: 8.5 mg/dL — ABNORMAL LOW (ref 8.9–10.3)
Chloride: 93 mmol/L — ABNORMAL LOW (ref 98–111)
Creatinine, Ser: 0.59 mg/dL — ABNORMAL LOW (ref 0.61–1.24)
GFR, Estimated: >60 mL/min (ref >60)
Glucose, Bld: 55 mg/dL — ABNORMAL LOW (ref 70–99)
Potassium: 4.5 mmol/L (ref 3.5–5.1)
Sodium: 125 mmol/L — ABNORMAL LOW (ref 135–145)
Total Bilirubin: 0.4 mg/dL (ref 0.3–1.2)
Total Protein: 5.3 g/dL — ABNORMAL LOW (ref 6.5–8.1)

## 2021-07-13 LAB — HEMOGLOBIN A1C
Hgb A1c MFr Bld: 5.7 % — ABNORMAL HIGH (ref 4.8–5.6)
Mean Plasma Glucose: 116.89 mg/dL

## 2021-07-13 LAB — CREATININE, URINE, RANDOM: Creatinine, Urine: 87.09 mg/dL

## 2021-07-13 LAB — PHOSPHORUS: Phosphorus: 3 mg/dL (ref 2.5–4.6)

## 2021-07-13 LAB — T4, FREE: Free T4: 1.07 ng/dL (ref 0.61–1.12)

## 2021-07-13 LAB — SODIUM, URINE, RANDOM: Sodium, Ur: 33 mmol/L

## 2021-07-13 LAB — SODIUM: Sodium: 125 mmol/L — ABNORMAL LOW (ref 135–145)

## 2021-07-13 LAB — LIPID PANEL
Cholesterol: 129 mg/dL (ref 0–200)
HDL: 82 mg/dL (ref >40)
LDL Cholesterol: 41 mg/dL (ref 0–99)
Total CHOL/HDL Ratio: 1.6 RATIO
Triglycerides: 32 mg/dL (ref <150)
VLDL: 6 mg/dL (ref 0–40)

## 2021-07-13 LAB — OSMOLALITY: Osmolality: 256 mOsm/kg — ABNORMAL LOW (ref 275–295)

## 2021-07-13 LAB — OSMOLALITY, URINE: Osmolality, Ur: 533 mOsm/kg (ref 300–900)

## 2021-07-13 LAB — PROCALCITONIN: Procalcitonin: <0.1 ng/mL

## 2021-07-13 LAB — MAGNESIUM: Magnesium: 1.8 mg/dL (ref 1.7–2.4)

## 2021-07-13 LAB — TSH: TSH: 14.512 u[IU]/mL — ABNORMAL HIGH (ref 0.350–4.500)

## 2021-07-13 LAB — URIC ACID: Uric Acid, Serum: 2.7 mg/dL — ABNORMAL LOW (ref 3.7–8.6)

## 2021-07-13 IMAGING — MR MR CERVICAL SPINE W/O CM
4 of 5 series · 18 of 48 positions shown · non-contrast
Comparison: Cervical spine CT [DATE]

CLINICAL DATA: Myelopathy, acute, cervical spine. Left upper and
lower extremity numbness. History of tonsillar cancer.

EXAM:
MRI CERVICAL SPINE WITHOUT CONTRAST
TECHNIQUE: Multiplanar, multisequence MR imaging of the cervical spine was
performed. No intravenous contrast was administered.

[Series 3: T2 · sagittal · 3.0mm · 0.43mm/px · 4 of 16 slices shown (1 of 2)]
[im 1/16]
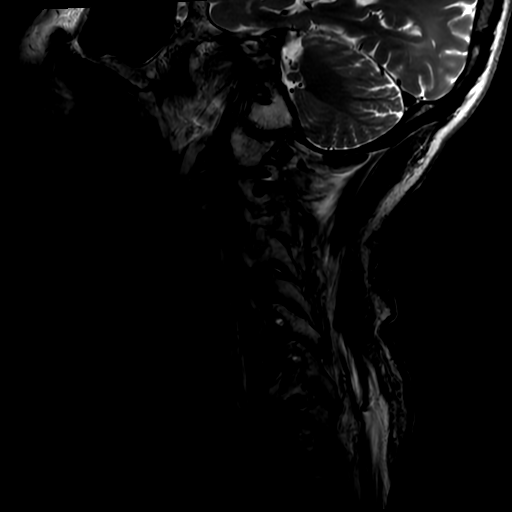
[im 6/16]
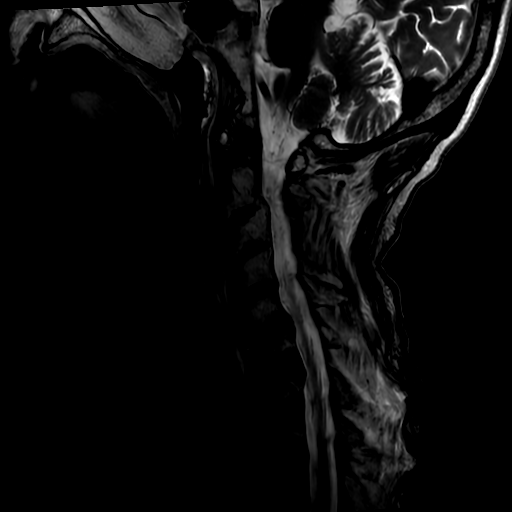
[im 11/16]
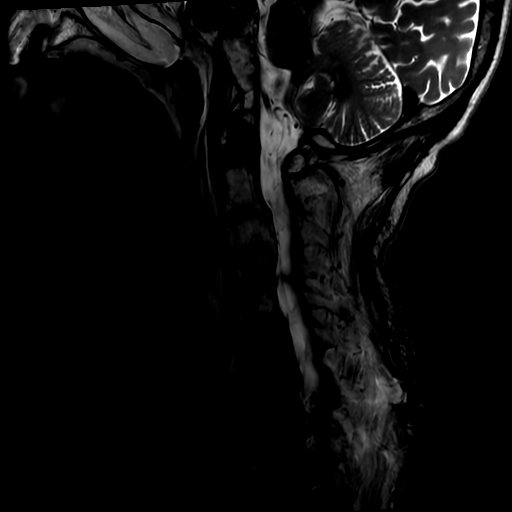
[im 16/16]
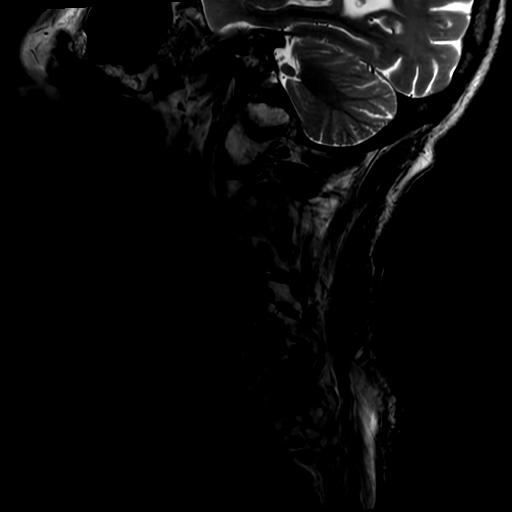

[Series 4: FLAIR · sagittal · 3.0mm · 0.43mm/px · 3 of 16 slices shown]
[im 1/16]
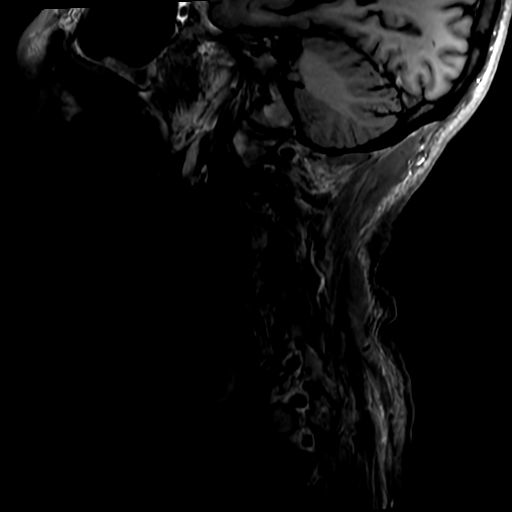
[im 8/16]
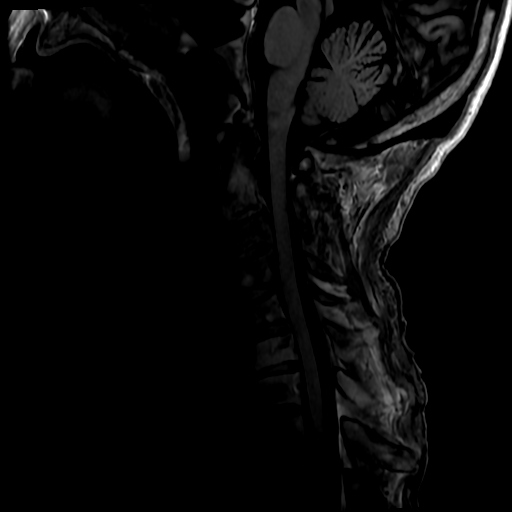
[im 16/16]
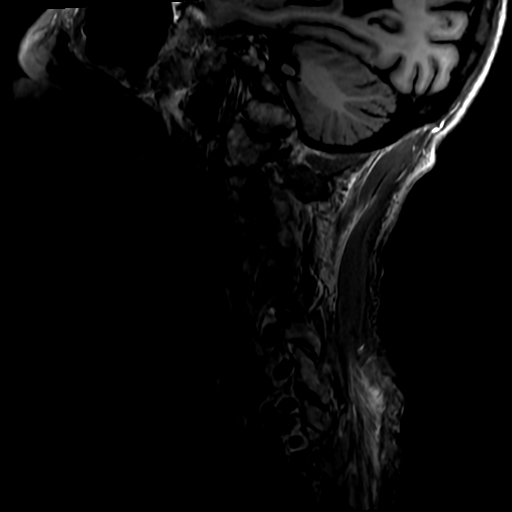

[Series 5: STIR · sagittal · 3.0mm · 0.43mm/px · 3 of 16 slices shown]
[im 1/16]
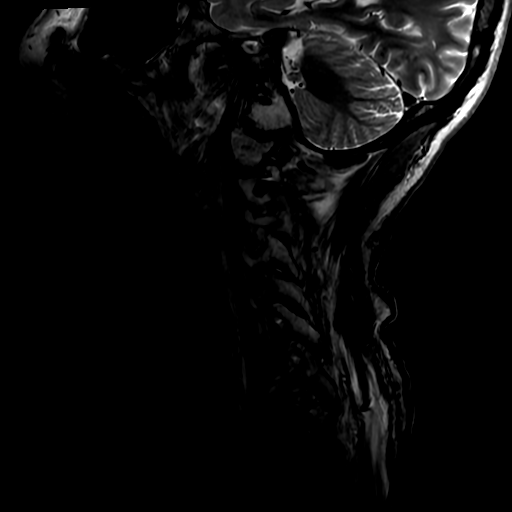
[im 8/16]
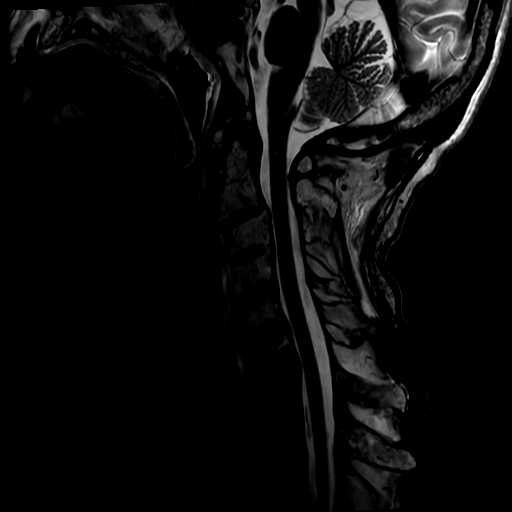
[im 16/16]
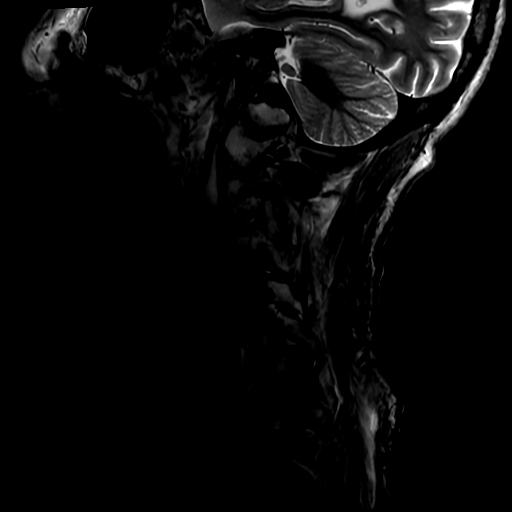

[Series 7: T2 · oblique · 3.0mm · 0.35mm/px · 8 of 30 slices shown (2 of 2)]
[im 1/30]
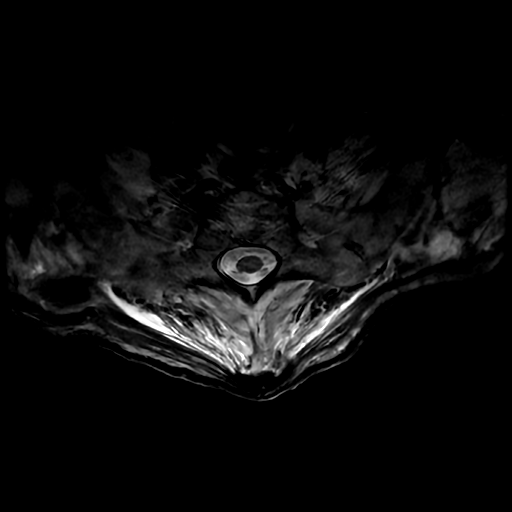
[im 4/30]
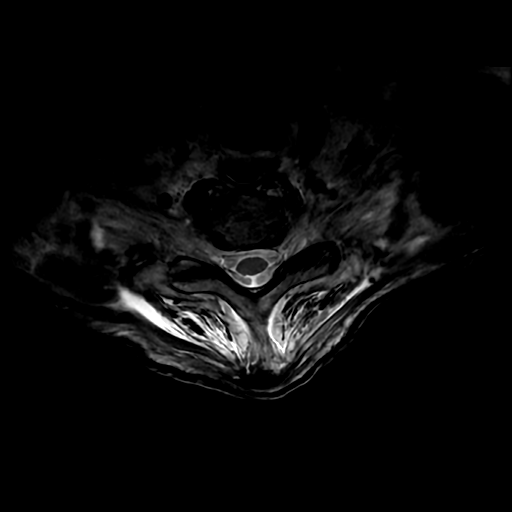
[im 8/30]
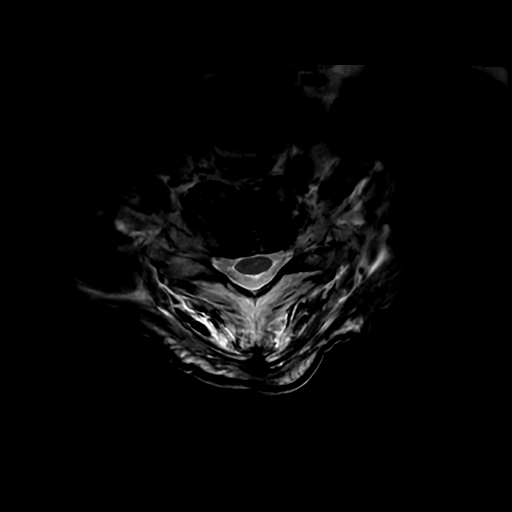
[im 11/30]
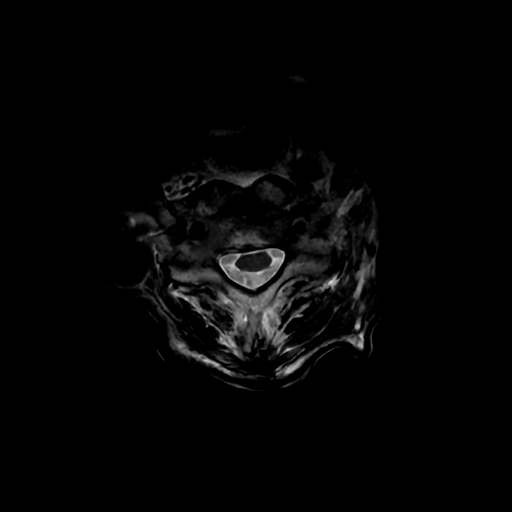
[im 15/30]
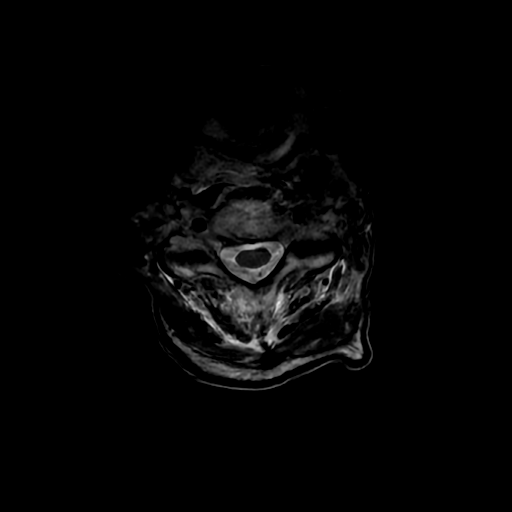
[im 19/30]
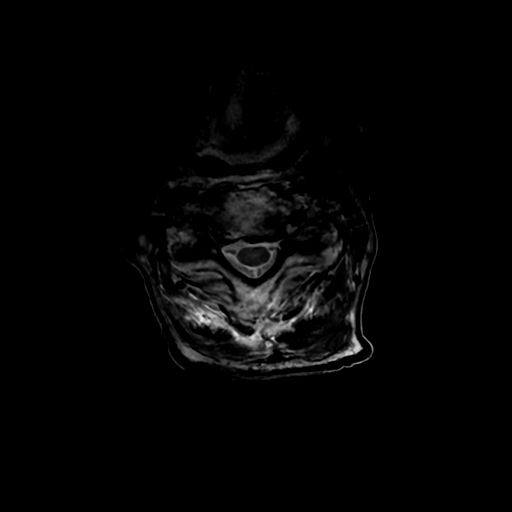
[im 22/30]
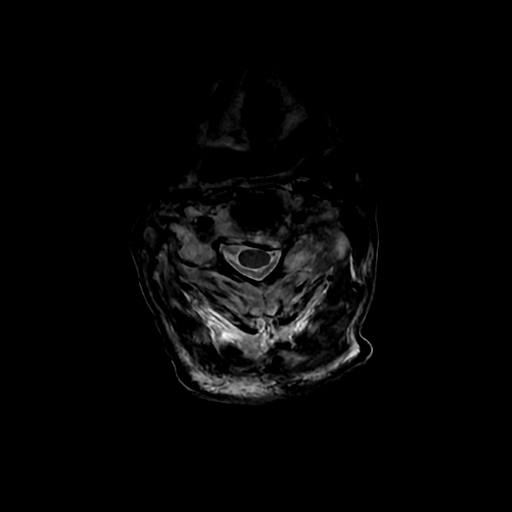
[im 26/30]
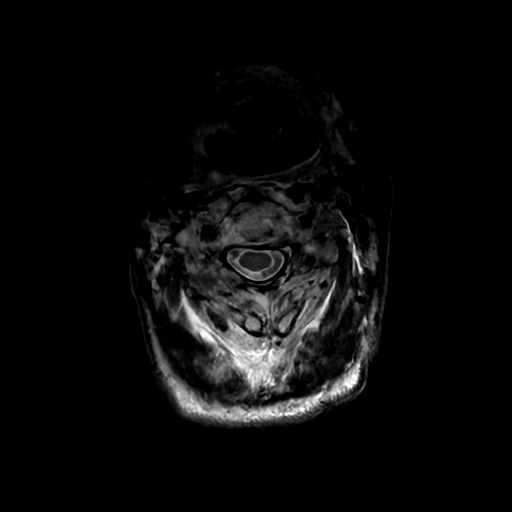

[18 of 48 positions shown; findings below may reference images not displayed]

FINDINGS: Alignment: Straightening of the normal cervical lordosis. Trace
anterolisthesis of C2 on C3.

Vertebrae: No acute fracture. Remote mild T2 superior endplate
compression fracture. Prominent fatty marrow changes throughout the
spine.

Cord: 1.5 mm focus of T2 hypointensity in the right dorsal spinal
cord at C2-3 with prominent susceptibility artifact suggesting
chronic blood products as there is no calcification visible on CT
and no cord edema. Normal cord signal and morphology elsewhere.

Posterior Fossa, vertebral arteries, paraspinal tissues: Posterior
fossa more fully evaluated on the separate head MRI. Preserved
vertebral artery flow voids.

Disc levels:

C2-3: Anterolisthesis with disc uncovering, uncovertebral spurring,
and facet hypertrophy result in mild left neural foraminal stenosis
without spinal stenosis. Bilateral facet ankylosis and partial
interbody ankylosis.

C3-4: Asymmetric left uncovertebral spurring and asymmetric left
facet arthrosis result in severe left neural foraminal stenosis
without spinal stenosis. Left facet ankylosis and partial interbody
ankylosis.

C4-5: Uncovertebral spurring and mild-to-moderate facet arthrosis
result in moderate bilateral neural foraminal stenosis without
spinal stenosis.

C5-6: Bridging anterior vertebral osteophytes. Minimal uncovertebral
spurring and mild facet arthrosis without significant stenosis.

C6-7: Bridging anterior vertebral osteophytes.  No stenosis.

C7-T1: Mild facet arthrosis without stenosis.
IMPRESSION: 1. Punctate focus of likely chronic hemorrhage in the spinal cord at
C2-3.
2. Cervical spondylosis and facet arthrosis without spinal stenosis.
3. Severe left neural foraminal stenosis at C3-4 and moderate
bilateral neural foraminal stenosis at C4-5.

## 2021-07-13 IMAGING — CT CT ANGIO HEAD-NECK (W OR W/O PERF)
1 of 10 series · 5 of 33 positions shown · IV contrast (OMNI 350)
Comparison: CTA of the neck [DATE]

CLINICAL DATA: Acute stroke suspected

EXAM:
CT ANGIOGRAPHY HEAD AND NECK
TECHNIQUE: Multidetector CT imaging of the head and neck was performed using
the standard protocol during bolus administration of intravenous
contrast. Multiplanar CT image reconstructions and MIPs were
obtained to evaluate the vascular anatomy. Carotid stenosis
measurements (when applicable) are obtained utilizing NASCET
criteria, using the distal internal carotid diameter as the
denominator.

[Series 510: cta neck thins · axial · 0.46mm/px · z∈[-262,-20]mm · 5 of 895 slices shown]
[im 150/895  soft-tissue]
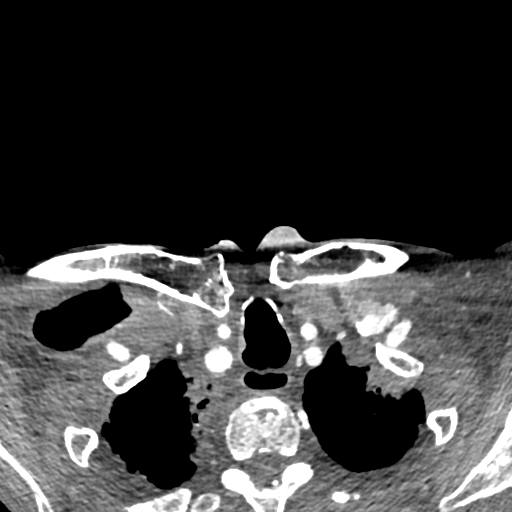
[im 299/895  bone]
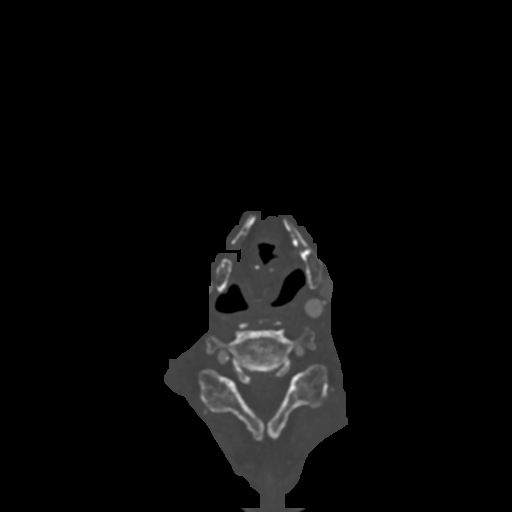
[im 448/895  soft-tissue]
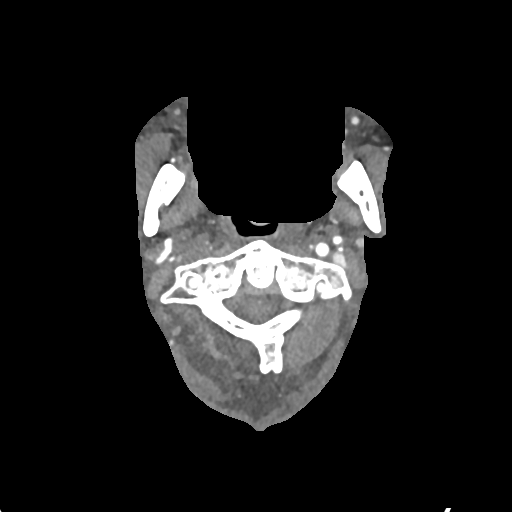
[im 597/895  bone]
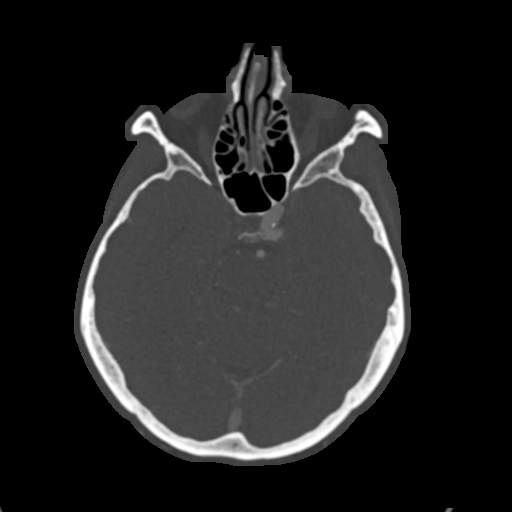
[im 746/895  soft-tissue]
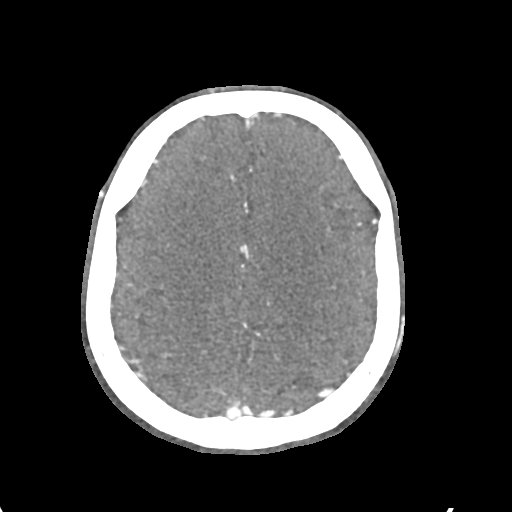

[5 of 33 positions shown; findings below may reference images not displayed]

RADIATION DOSE REDUCTION: This exam was performed according to the
departmental dose-optimization program which includes automated
exposure control, adjustment of the mA and/or kV according to
patient size and/or use of iterative reconstruction technique.

CONTRAST:  Dose is not known on this in progress study
FINDINGS: CTA NECK FINDINGS

Aortic arch: Partial coverage shows diffuse atheromatous plaque with
3 vessel branching.

Right carotid system: Bulky plaque at the bifurcation with chronic
proximal ICA occlusion continuing to the intracranial segments. No
interval change

Left carotid system: Diffuse atheromatous wall thickening and
intermittent calcification. Interval stenting of the proximal left
ICA with no evidence of in stent stenosis or new narrowing.

Vertebral arteries: Proximal subclavian atherosclerosis without flow
reducing stenosis. Bilateral vertebral origin plaque measuring at
least 60% on coronal reformats. Mild downstream plaque without
significant V2 or V3 segment stenosis. No dissection.

Skeleton: Cervical spine degeneration with multilevel ankylosis.

Other neck: Non masslike architectural distortion in the neck from
prior head neck cancer treatment. Neck dissection on the right at
least

Upper chest: Biapical radiation fibrosis.  No acute finding.

Review of the MIP images confirms the above findings

CTA HEAD FINDINGS

Anterior circulation: Reconstituted right ICA at the paraclinoid
segment where the ophthalmic arteries enhancing and where there is a
large right posterior communicating artery. Anterior communicating
artery is also present. No branch occlusion, beading, or aneurysm.

Posterior circulation: Right dominant vertebral artery. The
vertebral and basilar arteries are smoothly contoured and widely
patent. No branch occlusion, beading, or aneurysm.

Venous sinuses: Diffusely patent

Anatomic variants: None significant

Review of the MIP images confirms the above findings
IMPRESSION: 1. No emergent finding.
2. Chronic right ICA occlusion from the origin to the paraclinoid
segment. Intact circle-of-Willis.
3. Left proximal carotid stenting since prior. No in stent stenosis.
4. Bilateral vertebral origin atheromatous stenosis of at least 60%.

## 2021-07-13 IMAGING — MR MR HEAD W/O CM
5 of 10 series · 24 of 48 positions shown · non-contrast
Comparison: Head CT and CTA [DATE]

CLINICAL DATA: Neuro deficit, acute, stroke suspected. Left upper
and lower extremity numbness. History of tonsillar cancer.

EXAM:
MRI HEAD WITHOUT CONTRAST
TECHNIQUE: Multiplanar, multiecho pulse sequences of the brain and surrounding
structures were obtained without intravenous contrast.

[Series 2: DWI · axial · 3.0mm · 0.94mm/px · z∈[-103,+40]mm · 9 of 97 slices shown (1 of 2)]
[im 1/97]
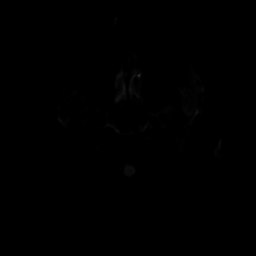
[im 13/97]
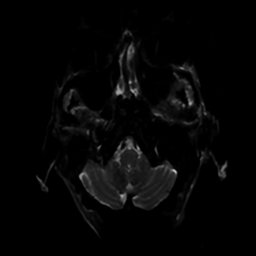
[im 25/97]
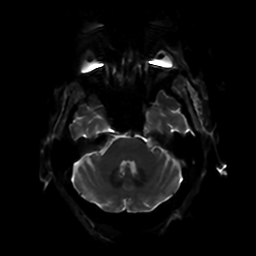
[im 37/97]
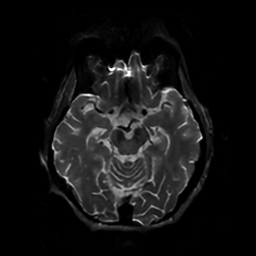
[im 49/97]
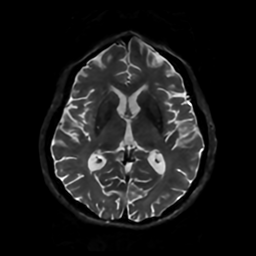
[im 61/97]
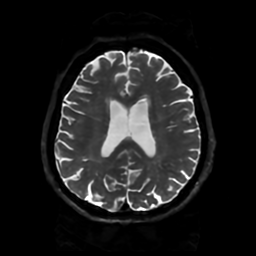
[im 73/97]
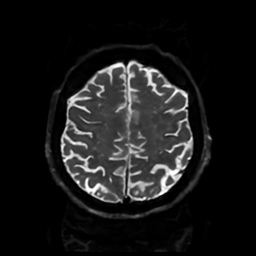
[im 85/97]
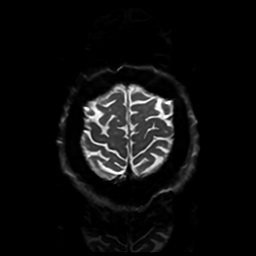
[im 97/97]
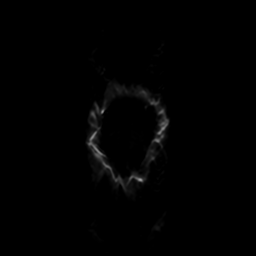

[Series 3: DWI · coronal · 4.0mm · 0.94mm/px · 7 of 68 slices shown (2 of 2)]
[im 1/68]
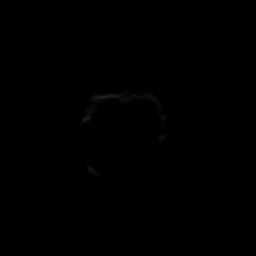
[im 12/68]
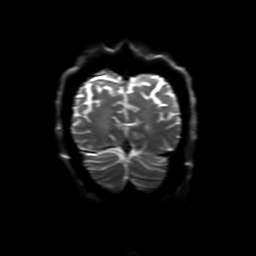
[im 23/68]
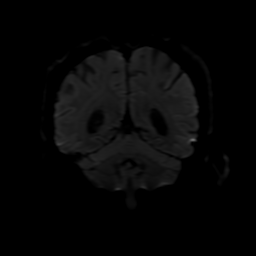
[im 34/68]
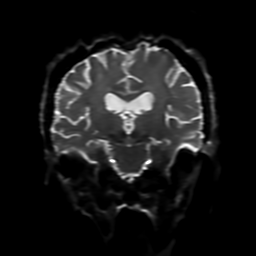
[im 45/68]
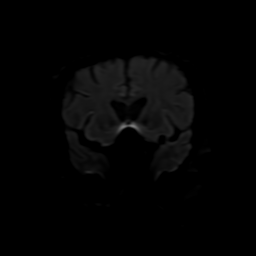
[im 56/68]
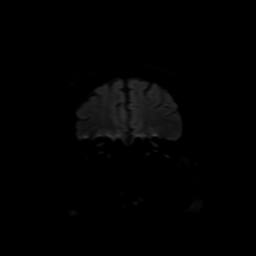
[im 68/68]
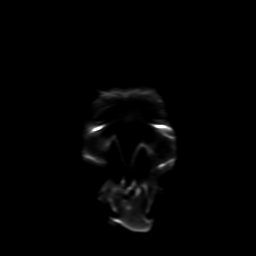

[Series 4: FLAIR · sagittal · 5.0mm · 0.23mm/px · 3 of 26 slices shown (1 of 2)]
[im 1/26]
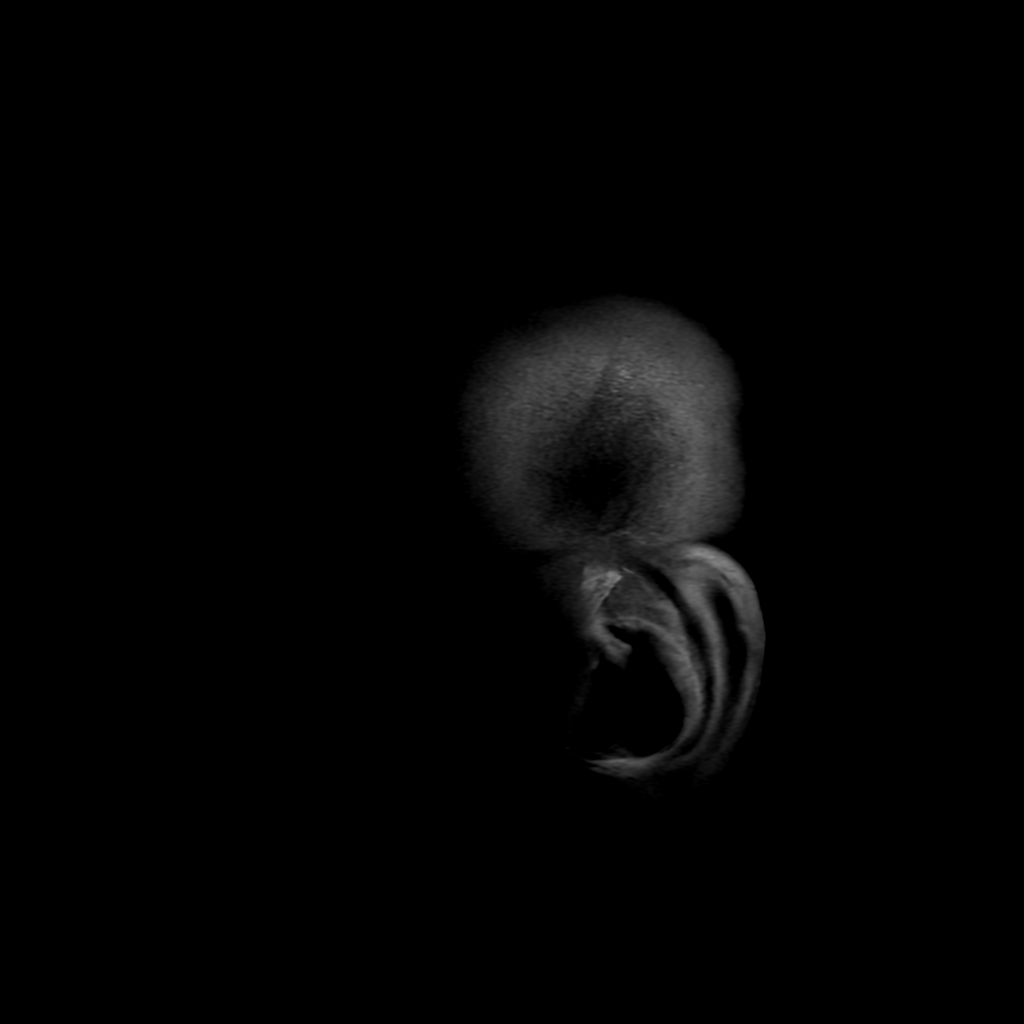
[im 13/26]
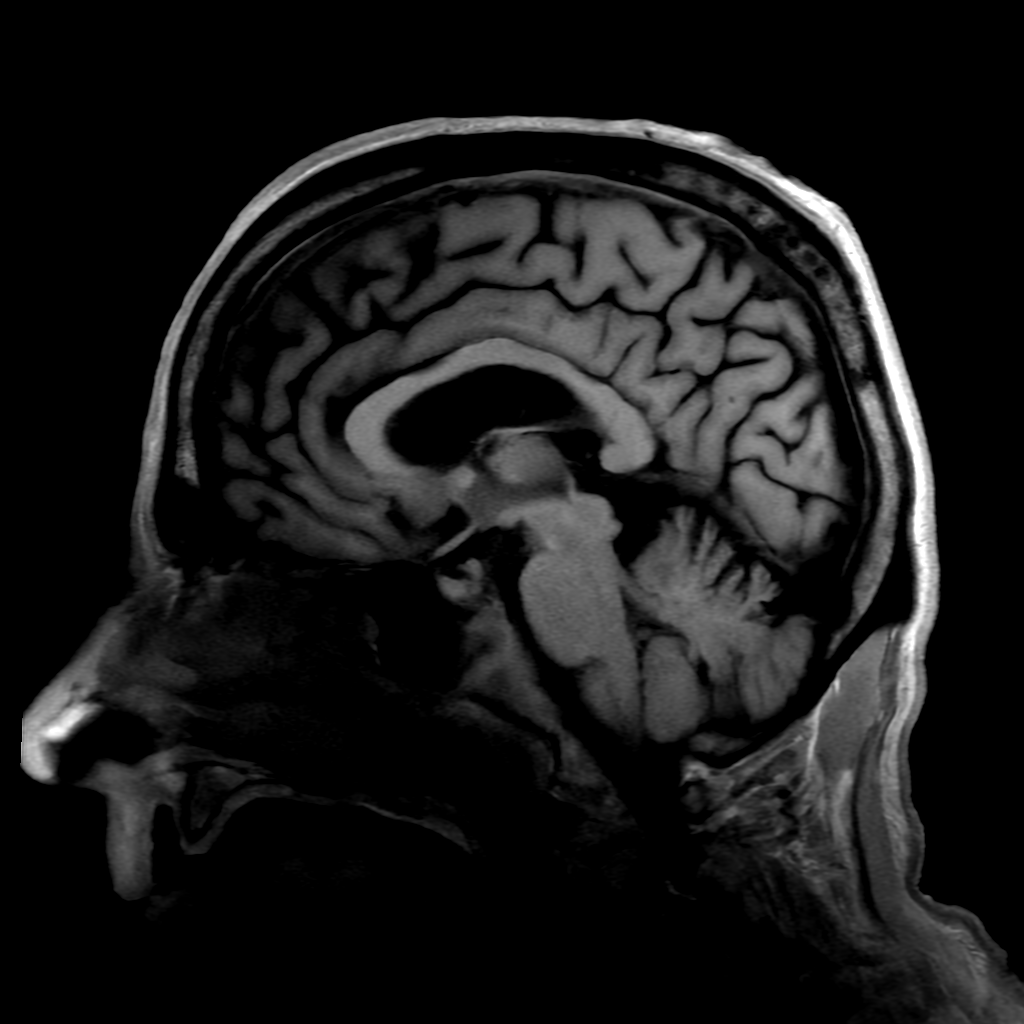
[im 26/26]
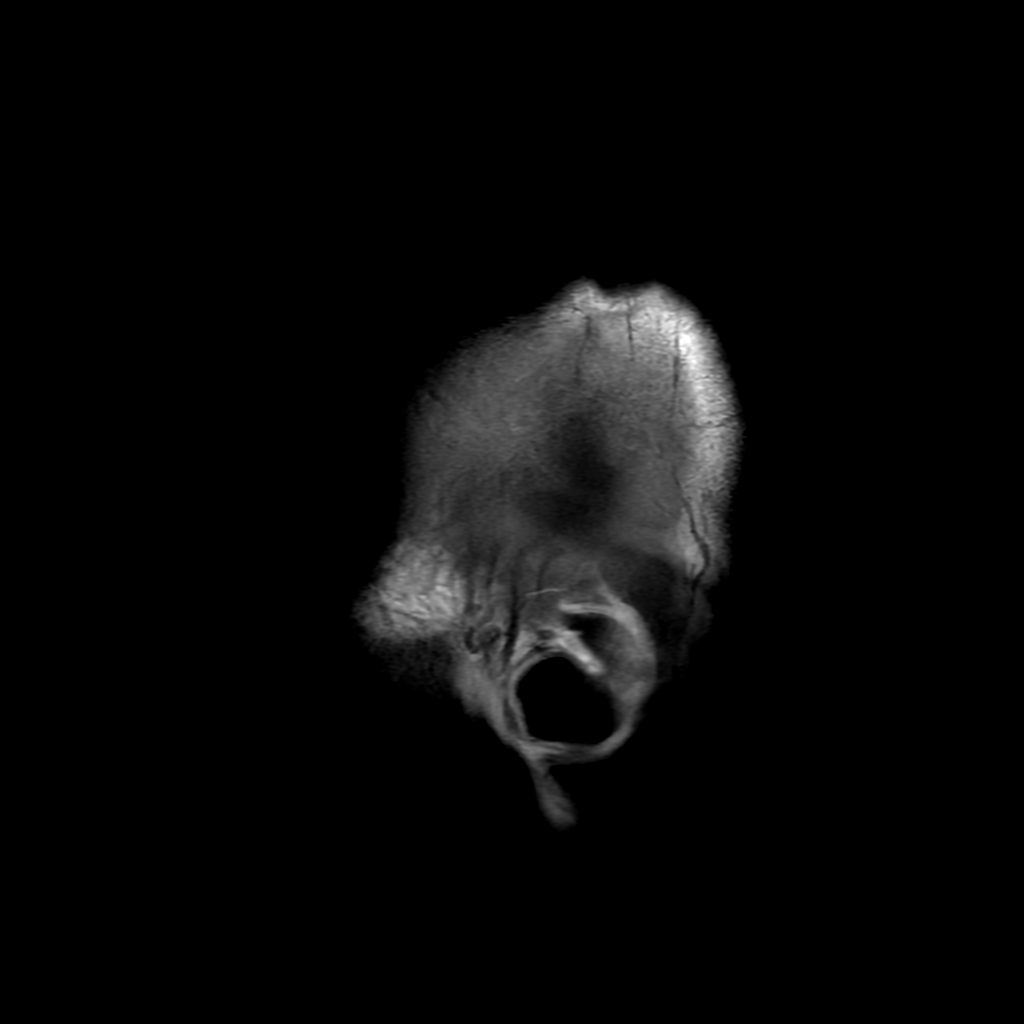

[Series 6: FLAIR · axial · 4.0mm · 0.45mm/px · z∈[-99,+41]mm · 3 of 33 slices shown (2 of 2)]
[im 1/33]
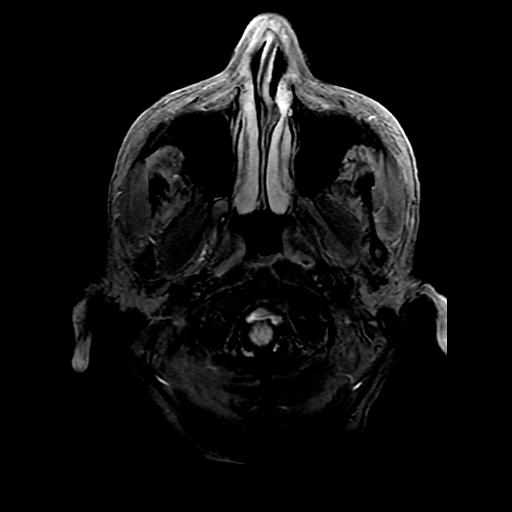
[im 17/33]
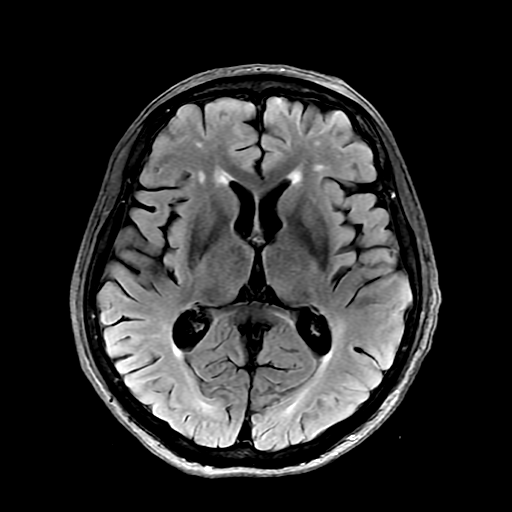
[im 33/33]
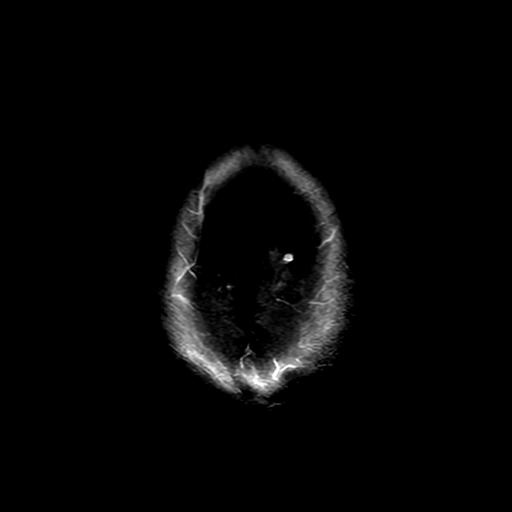

[Series 250: ADC · axial · 3.0mm · 0.94mm/px · z∈[-103,-61]mm · 2 of 44 slices shown]
[im 1/44]
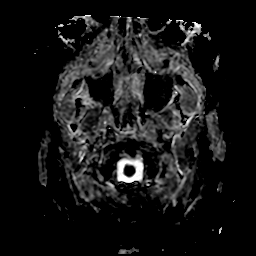
[im 15/44]
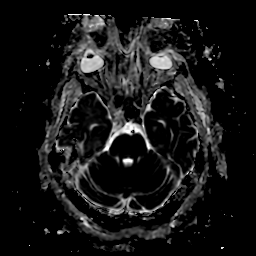

[24 of 48 positions shown; findings below may reference images not displayed]

FINDINGS: Brain: There is no evidence of an acute infarct, mass, midline
shift, or extra-axial fluid collection. Small T2 hyperintensities in
the cerebral white matter bilaterally are nonspecific but compatible
with mild chronic small vessel ischemic disease. There [DATE] or 2
chronic microhemorrhages in the left frontal lobe. The ventricles
and sulci are within normal limits for age.

Vascular: Abnormal appearance of the distal right internal carotid
artery corresponding to known occlusion.

Skull and upper cervical spine: Unremarkable bone marrow signal.

Sinuses/Orbits: Mild bilateral ethmoid air cell mucosal thickening.
Small bilateral mastoid effusions.

Other: None.
IMPRESSION: 1. No acute intracranial abnormality.
2. Mild chronic small vessel ischemic disease.
3. Chronic right ICA occlusion.

## 2021-07-13 IMAGING — CT CT HEAD W/O CM
3 series · 15 of 47 positions shown, 18 images · non-contrast
Comparison: None.

CLINICAL DATA: Acute stroke suspected



[Series 2: head 5.0 st · axial · 0.39mm/px · z∈[-108,+32]mm · 9 of 34 slices shown, 12 images]
[im 3/34  brain]
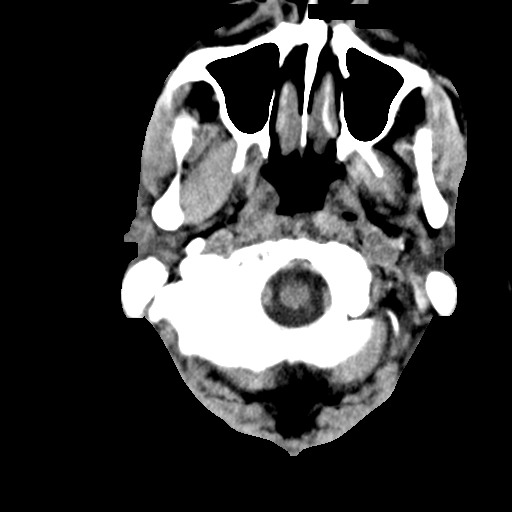
[im 3/34  bone]
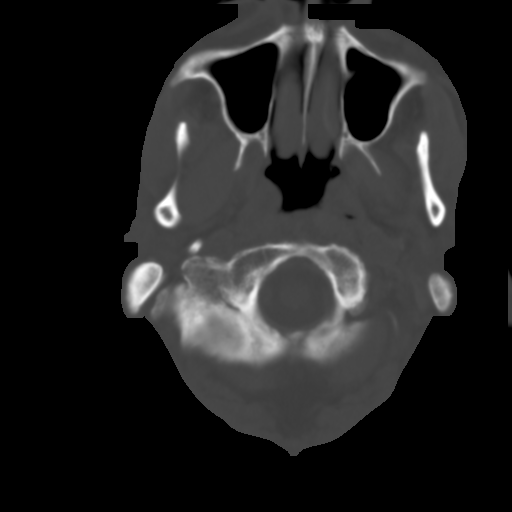
[im 6/34  brain]
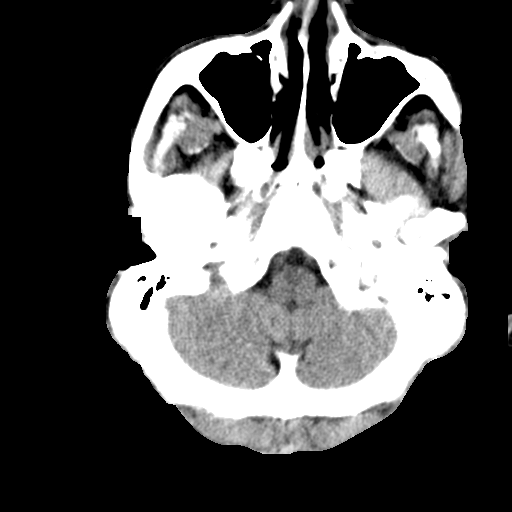
[im 10/34  brain]
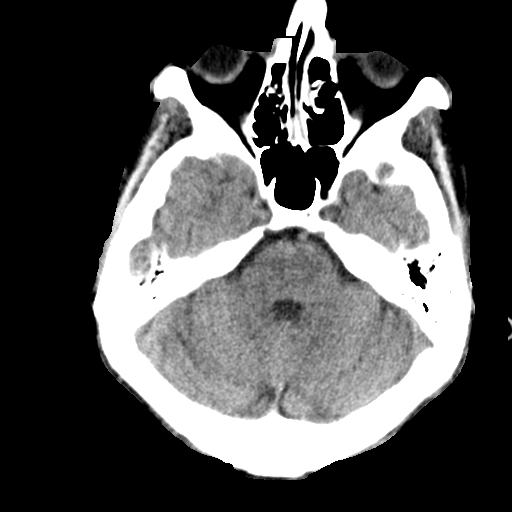
[im 13/34  brain]
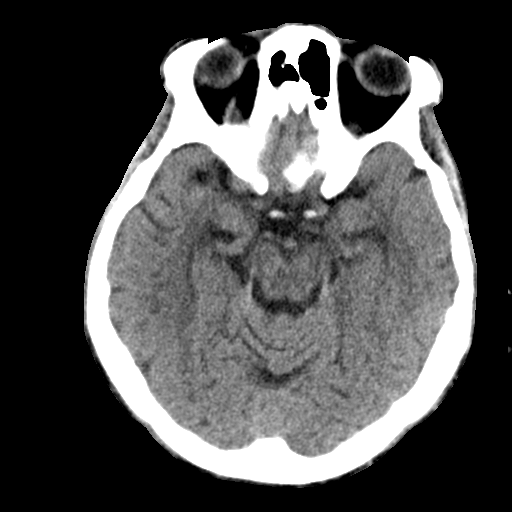
[im 18/34  brain]
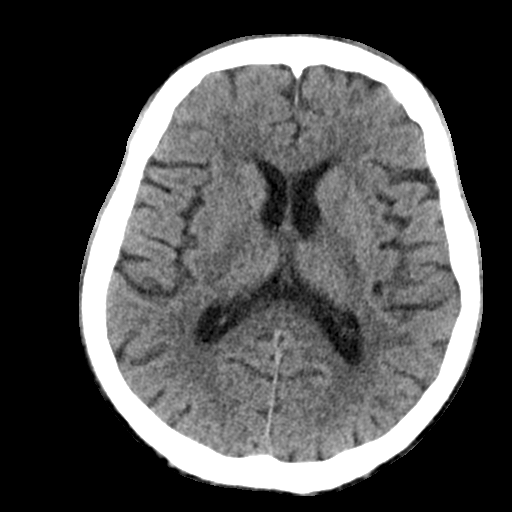
[im 18/34  bone]
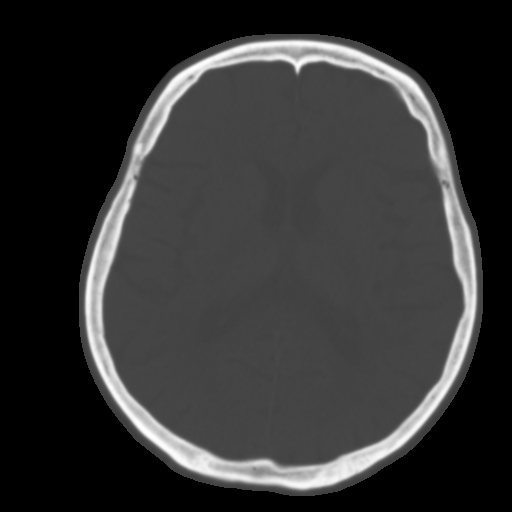
[im 21/34  brain]
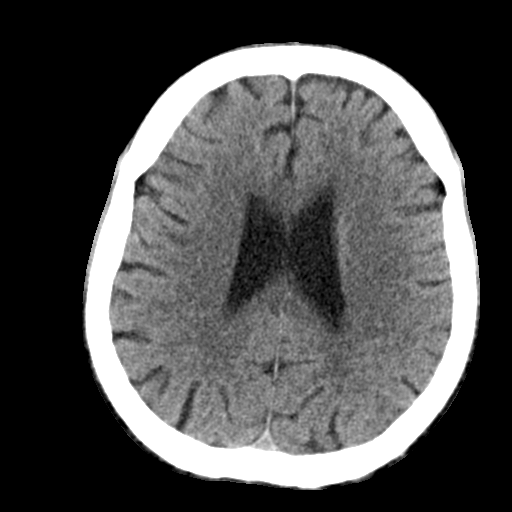
[im 24/34  brain]
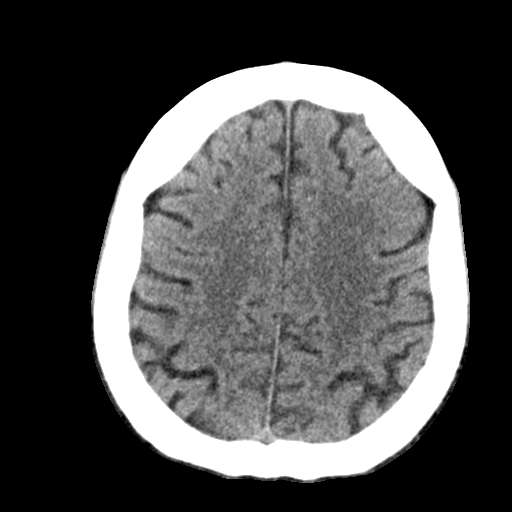
[im 28/34  brain]
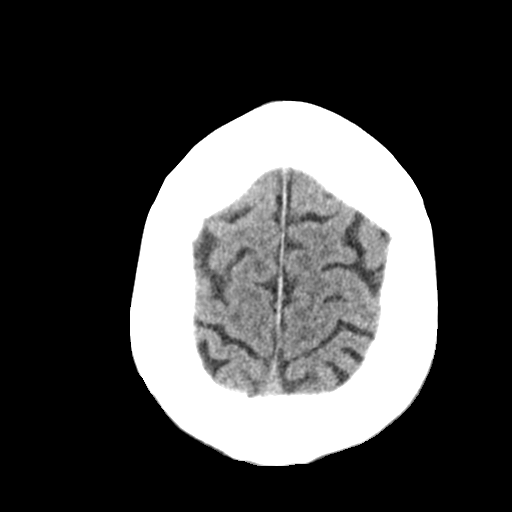
[im 31/34  brain]
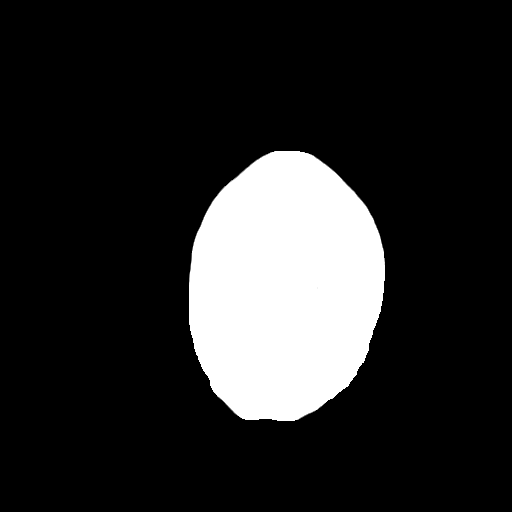
[im 31/34  bone]
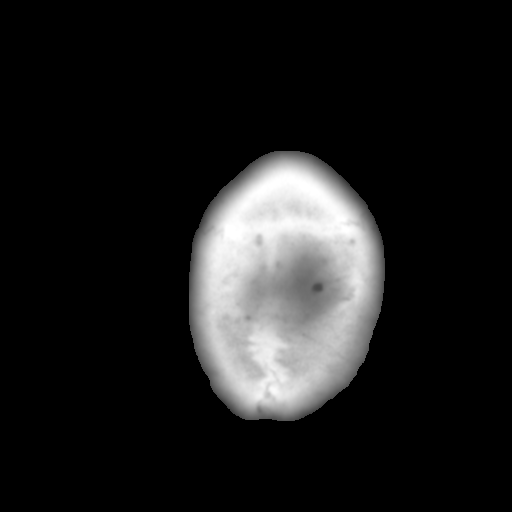

[Series 4: head 3.0 cor st · coronal · 0.34mm/px · 3 of 67 slices shown]
[im 23/67  brain]
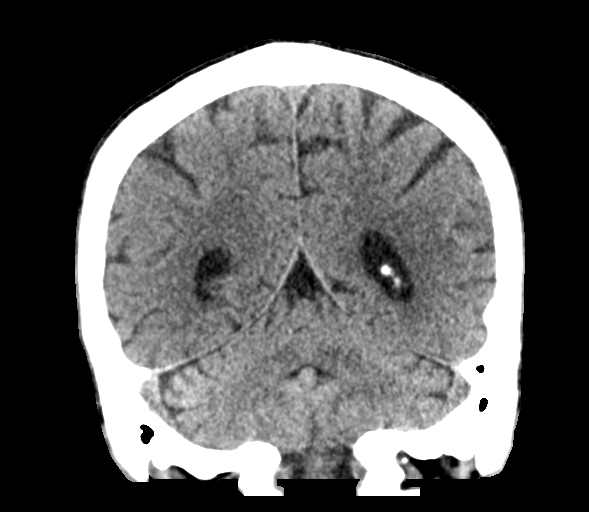
[im 30/67  brain]
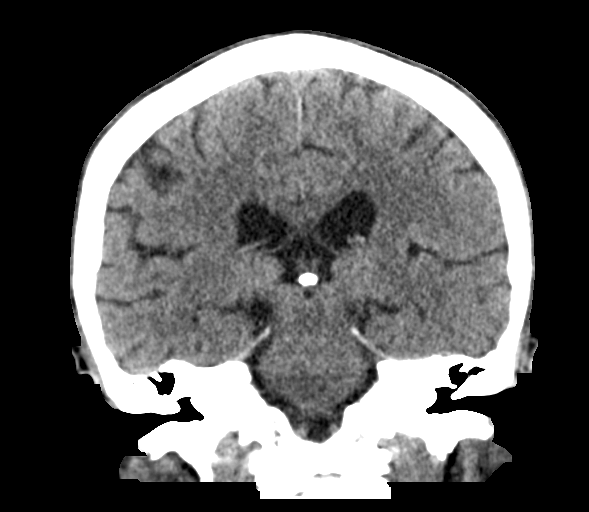
[im 37/67  brain]
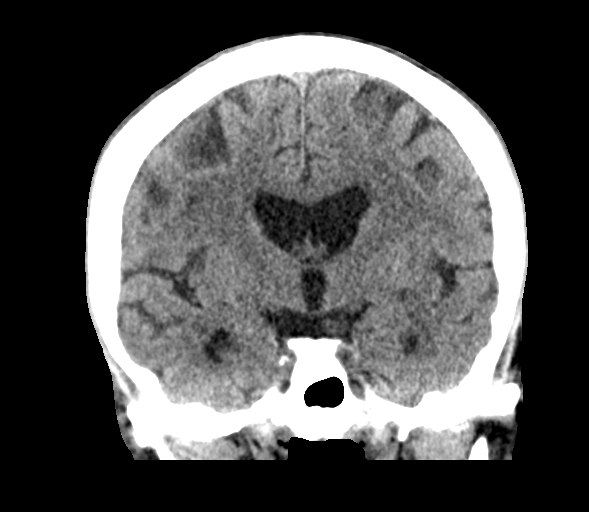

[Series 6: head 3.0 sag st · sagittal · 0.33mm/px · 3 of 67 slices shown]
[im 23/67  brain]
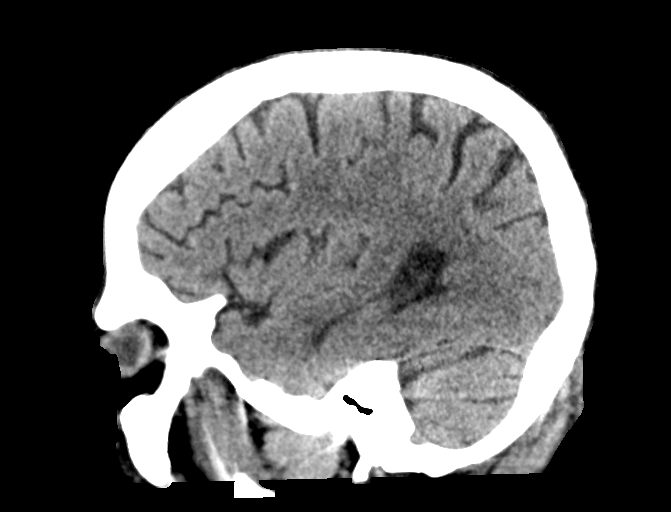
[im 34/67  brain]
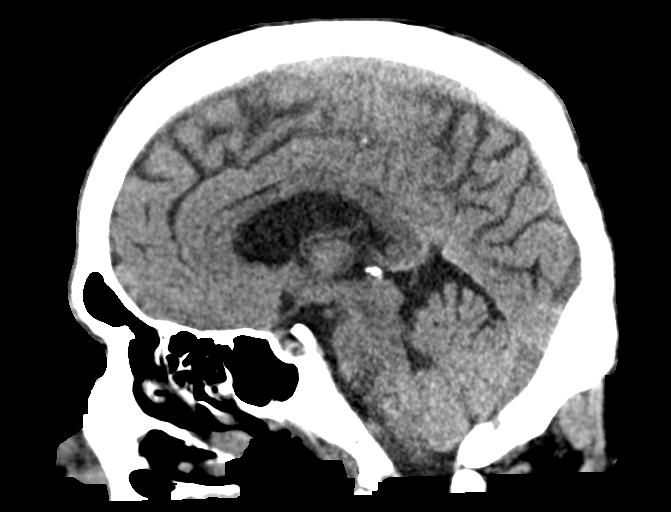
[im 45/67  brain]
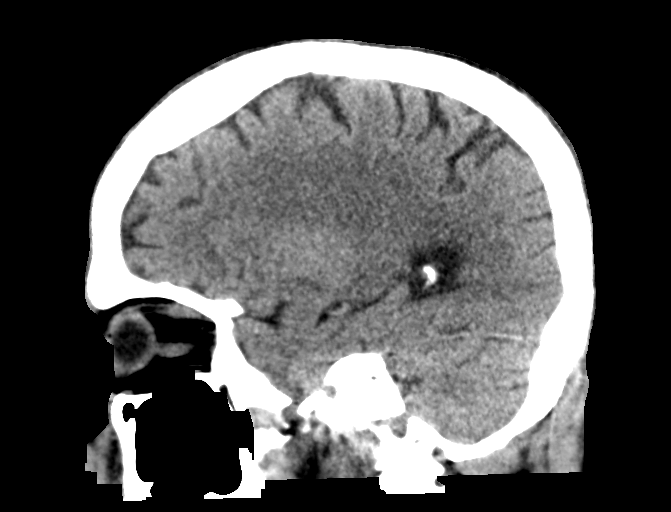

[15 of 47 positions shown; findings below may reference images not displayed]

FINDINGS: Brain: No evidence of acute infarction, hemorrhage, hydrocephalus,
extra-axial collection or mass lesion/mass effect.

Vascular: No hyperdense vessel or unexpected calcification.

Skull: Normal. Negative for fracture or focal lesion.

Sinuses/Orbits: No acute finding.

Other: These results were communicated to Dr. HIO U at [DATE]
on [DATE] by text page via the [employer] messaging system.
IMPRESSION: Negative head CT.

## 2021-07-13 IMAGING — CT CT CERVICAL SPINE W/O CM
3 of 14 series · 8 of 33 positions shown, 9 images · IV contrast (OMNI 350)
Comparison: None similar

CLINICAL DATA: Acute stroke suspected

EXAM:
CT Cervical Spine with contrast
TECHNIQUE: Multiplanar CT images of the cervical spine were reconstructed from
contemporary CTA of the Neck.
RADIATION DOSE REDUCTION: This exam was performed according to the
departmental dose-optimization program which includes automated
exposure control, adjustment of the mA and/or kV according to
patient size and/or use of iterative reconstruction technique.
CONTRAST:  None additional

[Series 7: cta neck axial · axial · 0.45mm/px · z∈[-231,-51]mm · 3 of 361 slices shown, 4 images]
[im 91/361  soft-tissue]
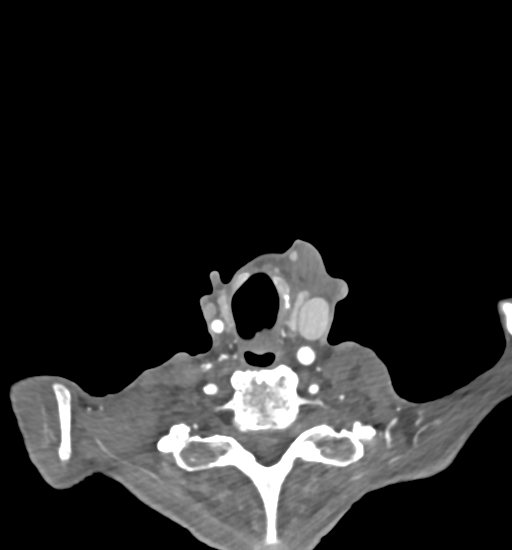
[im 91/361  bone]
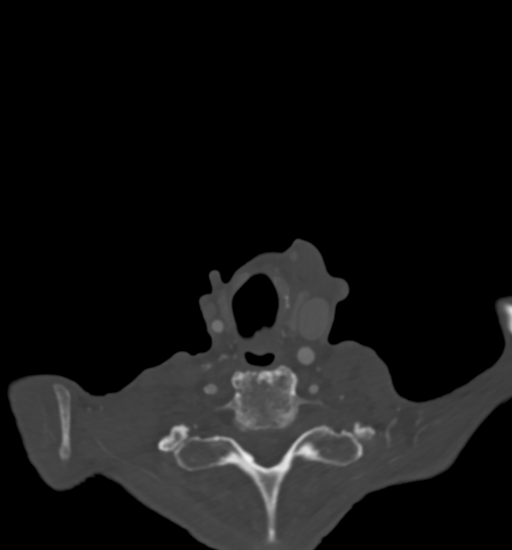
[im 181/361  bone]
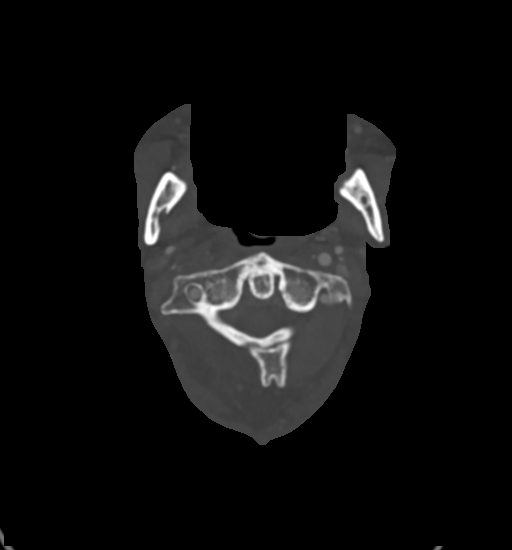
[im 271/361  bone]
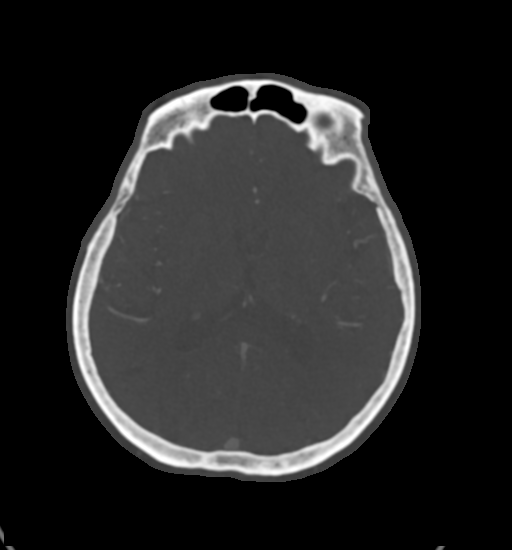

[Series 8: cta neck coronal · coronal · 0.47mm/px · 1 of 233 slices shown]
[im 117/233  bone]
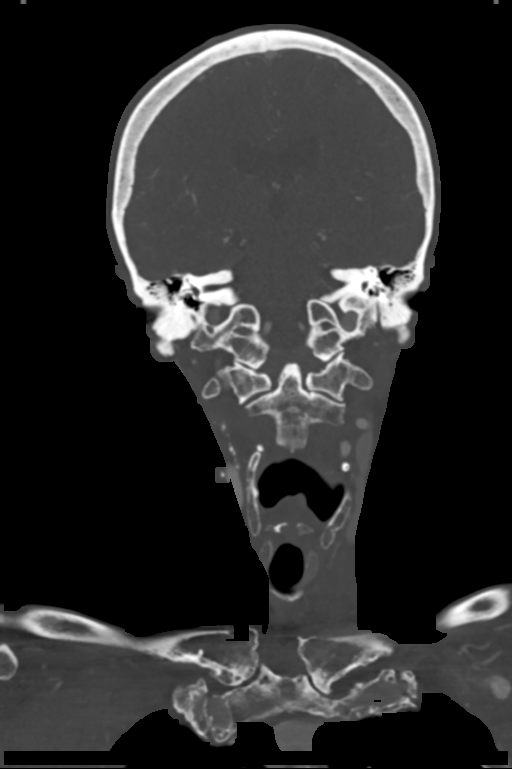

[Series 9: cta neck sagittal · sagittal · 0.47mm/px · 4 of 212 slices shown]
[im 43/212  bone]
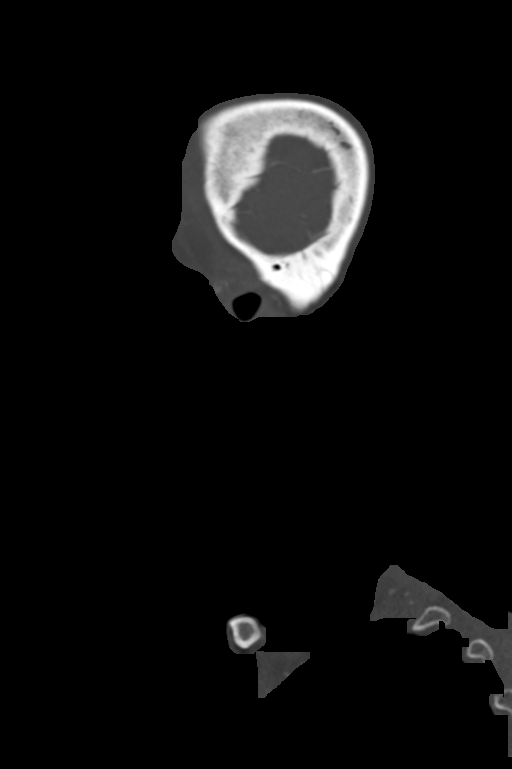
[im 85/212  bone]
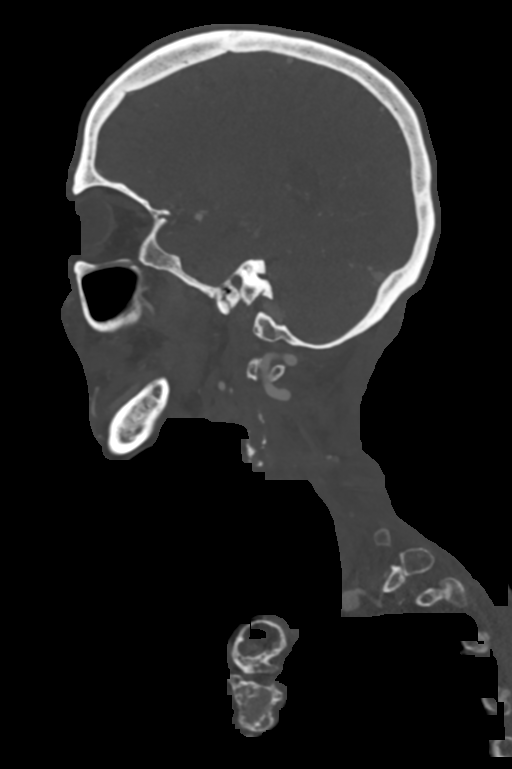
[im 127/212  bone]
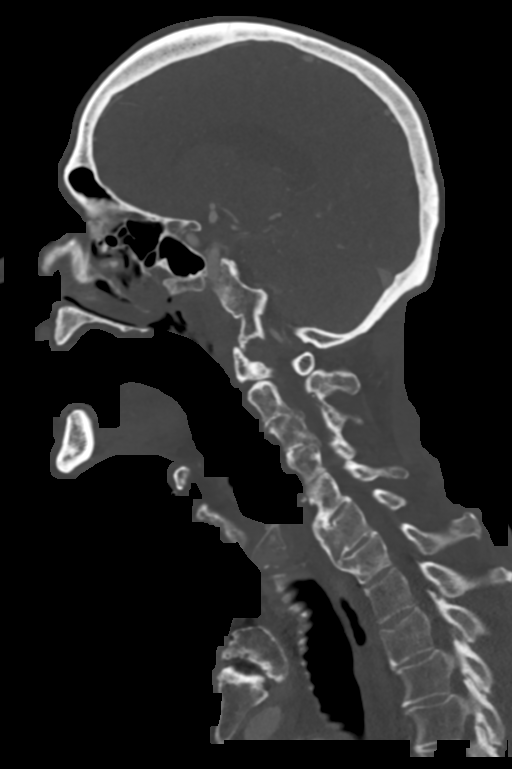
[im 169/212  bone]
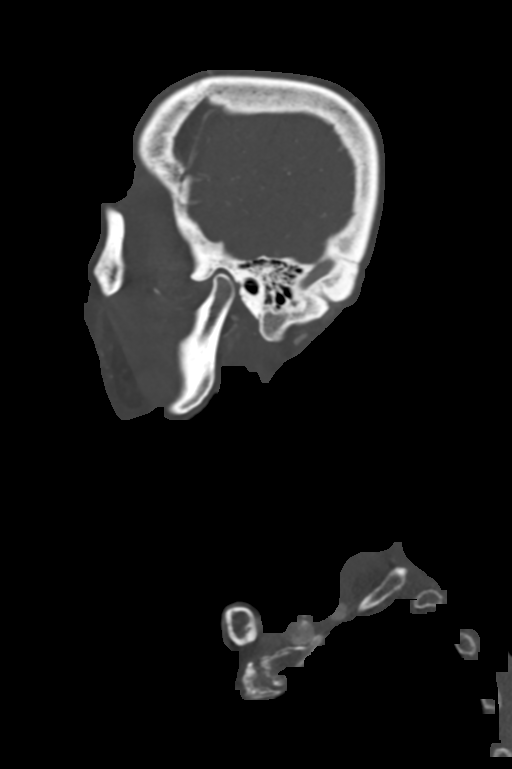

[8 of 33 positions shown; findings below may reference images not displayed]

FINDINGS: Alignment: No traumatic malalignment.

Skull base and vertebrae: No acute fracture or focal bone lesion.
Generalized osteopenia. Remote T2 superior endplate fracture.

Soft tissues and spinal canal: Post treatment neck. No perispinal or
visible intra canal hemorrhage/collection.

Disc levels: Degenerative ankylosis at C2-C4. Degenerative ankylosis
at C5-C7. Generalized facet spurring. Up to moderate foraminal
narrowing at C4-5 bilaterally.

Upper chest: Reported separately
IMPRESSION: No evidence of acute injury to the cervical spine.

Spinal degeneration and multilevel ankylosis.

## 2021-07-13 MED ORDER — ASPIRIN 81 MG PO CHEW
81.0000 mg | CHEWABLE_TABLET | Freq: Every evening | ORAL | Status: DC
  Administered 2021-07-13 – 2021-07-18 (×6): 81 mg
  Filled 2021-07-13 (×6): qty 1

## 2021-07-13 MED ORDER — MORPHINE SULFATE (PF) 2 MG/ML IV SOLN
2.0000 mg | INTRAVENOUS | Status: DC | PRN
  Administered 2021-07-25: 2 mg via INTRAVENOUS
  Filled 2021-07-13: qty 1

## 2021-07-13 MED ORDER — LACTATED RINGERS IV BOLUS
500.0000 mL | Freq: Once | INTRAVENOUS | Status: AC
  Administered 2021-07-13: 500 mL via INTRAVENOUS

## 2021-07-13 MED ORDER — MIDODRINE HCL 5 MG PO TABS
5.0000 mg | ORAL_TABLET | Freq: Three times a day (TID) | ORAL | Status: DC
  Administered 2021-07-13 – 2021-07-22 (×16): 5 mg
  Filled 2021-07-13 (×18): qty 1

## 2021-07-13 MED ORDER — SODIUM CHLORIDE 0.9 % IV SOLN: INTRAVENOUS | Status: DC

## 2021-07-13 MED ORDER — LEVOTHYROXINE SODIUM 75 MCG PO TABS
75.0000 ug | ORAL_TABLET | Freq: Every day | ORAL | Status: DC
  Administered 2021-07-13 – 2021-07-14 (×2): 75 ug
  Filled 2021-07-13 (×2): qty 1

## 2021-07-13 MED ORDER — CLOPIDOGREL BISULFATE 75 MG PO TABS
75.0000 mg | ORAL_TABLET | Freq: Every day | ORAL | Status: DC
  Administered 2021-07-13 – 2021-07-16 (×4): 75 mg
  Filled 2021-07-13 (×6): qty 1

## 2021-07-13 MED ORDER — ATORVASTATIN CALCIUM 10 MG PO TABS
20.0000 mg | ORAL_TABLET | Freq: Every day | ORAL | Status: DC
  Administered 2021-07-13 – 2021-07-27 (×14): 20 mg
  Filled 2021-07-13 (×15): qty 2

## 2021-07-13 MED ORDER — IOHEXOL 350 MG/ML SOLN
60.0000 mL | Freq: Once | INTRAVENOUS | Status: AC | PRN
  Administered 2021-07-13: 60 mL via INTRAVENOUS

## 2021-07-13 MED ORDER — LORATADINE 10 MG PO TABS
10.0000 mg | ORAL_TABLET | Freq: Every day | ORAL | Status: DC
  Administered 2021-07-13 – 2021-07-27 (×13): 10 mg
  Filled 2021-07-13 (×16): qty 1

## 2021-07-13 NOTE — ED Notes (Signed)
Pt placed on 2L [place] due to o2 sat 88%.

## 2021-07-13 NOTE — Consult Note (Signed)
Neurology Consultation Reason for Consult: Code stroke Referring Physician: Chotiner, B  CC: Numbness  History is obtained from: Patient  HPI: Alexander Duran is a 76 y.o. male with a history of tonsil cancer status post radiation, carotid occlusion on the right who began complaining of left-sided numbness that started in his left hand and then moved to the left leg.  A code stroke was activated at that time.  On my response, he was also complaining of right arm numbness as well.  He also complained of generalized weakness and neck pain.  My arrival, he was in the low 06T systolic.  He was taken emergently to CT/CTA which revealed no acute infarct but a chronic right ICA occlusion.  Following my evaluation and return to the room, his symptoms were rapidly improving and his blood pressures were increasing with systolics in the 016W.  Though he had not complained about things until 3 AM, given his hypothermia and the fact that he had some mild neglect on my initial exam, I do not note that his report is reliable, therefore I would favor considering it unclear time of onset.   LKW: Unclear tpa given?: no, not a stroke     Past Medical History:  Diagnosis Date   Arthritis    Cancer ([place])    Tonsil cancer   Carotid artery stenosis without cerebral infarction 08/30/2015   Difficulty in swallowing    History of radiation therapy    Hypertension    Hypothyroidism 05/09/2014   Other headache syndrome 08/10/2015   Sleep apnea    Weight loss 08/08/2015     Family History  Problem Relation Age of Onset   Cancer Father        lung cancer   COPD Mother    Macular degeneration Mother    Colon cancer Neg Hx    Stomach cancer Neg Hx    Pancreatic cancer Neg Hx      Social History:  reports that he quit smoking about 4 years ago. His smoking use included cigarettes. He has a 35.00 pack-year smoking history. He has never used smokeless tobacco. He reports that he does not currently  use alcohol after a past usage of about 2.0 standard drinks per week. He reports that he does not currently use drugs after having used the following drugs: Marijuana.   Exam: Current vital signs: BP (!) 99/57    Pulse (!) 59    Temp (!) 97.5 F (36.4 C) (Oral)    Resp 13    SpO2 100%  Vital signs in last 24 hours: Temp:  [93.7 F (34.3 C)-97.5 F (36.4 C)] 97.5 F (36.4 C) (01/27 0445) Pulse Rate:  [48-72] 59 (01/27 0519) Resp:  [8-21] 13 (01/27 0519) BP: (84-146)/(52-85) 99/57 (01/27 0519) SpO2:  [97 %-100 %] 100 % (01/27 0519)   Physical Exam  Constitutional: Appears chronically ill Psych: Anxious Eyes: No scleral injection HENT: No OP obstruction MSK: no joint deformities.  Cardiovascular: Normal rate and regular rhythm.  Respiratory: Effort normal, non-labored breathing GI: Soft.  No distension. There is no tenderness.  Skin: WDI  Neuro: Mental Status: Patient is awake, alert, able to tell me his age, but not month  No clear signs of aphasia,?  Mild neglect Cranial Nerves: II: Visual Fields are full. Pupils are equal, round, and reactive to light.   III,IV, VI: EOMI without ptosis or diploplia.  V: Facial sensation is symmetric to temperature VII: Facial movement is symmetric.  VIII: hearing  is intact to voice X: Uvula elevates symmetrically XI: Shoulder shrug is symmetric. XII: tongue is midline without atrophy or fasciculations.  Motor: He has marked giveaway weakness in all four extremities Sensory: He reports decreased sensation in the left arm and leg as well as the right arm Deep Tendon Reflexes: 2+ and symmetric in the  patellae.  Plantars: Toes are downgoing bilaterally.  Cerebellar: He is unable to perform   I have reviewed labs in epic and the results pertinent to this consultation are: Sodium 125 Creatinine 0.59  I have reviewed the images obtained: CT/CTA-chronic right occlusion  Impression: 76 year old male with transient left-sided  hypoesthesia as well as generalized weakness in the setting of hypotension.  I suspect that in the setting of his carotid stenosis, he developed focal symptoms due to his hypoperfusion.  He will need an MRI of his brain, but I will do an MRI of his cervical spine as well.  Recommendations: 1) MRI brain, cervical spine 2) Stroke workup if MRI is positive.    Roland Rack, MD [facility] [phone]  If 7pm- 7am, please page neurology on call as listed in [place].

## 2021-07-13 NOTE — Progress Notes (Signed)
PROGRESS NOTE    Alexander Duran  MVH:[id number] DOB: 04/25/1946 DOA: 07/12/2021 PCP: Windy Fast, MD    Chief Complaint  Patient presents with   Diarrhea    Brief Narrative:  Alexander Duran is a 76 y.o. 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  Of note, patient was recently hospitalized starting 06/17/2021 for COVID-19 infection, with associated positive COVID-19 test on the day of admission. Earlier this am pt had trouble with left arm numbness and a code stroke was called. MRI of the brain was negative for acute stroke, pt has chronic right ICA stenosis.  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   Assessment & Plan:   Principal Problem:   Dehydration Active Problems:   Hypothyroidism   Diarrhea   Generalized weakness   Acute hyponatremia   HLD (hyperlipidemia)   Pressure injury of skin   Dehydration probably from diarrhea . diarrhea has improved Continue with gentle hydration and repeat renal parameters in a.m. C. difficile PCR ordered and pending Since diarrhea has resolved we will slowly restart his tube feeds.   Generalized weakness with some tingling and numbness in the fingers MRI of the brain is negative for acute intracranial abnormalities or acute stroke. 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 Neurology consulted recommended adding Plavix to the aspirin and statin.  Chest x-ray does not show any infection at this time. COVID-19 PCR is negative.  Urinalysis is negative for infection.    Hyponatremia Probably secondary to hypovolemia from dehydration in the setting of GI losses in the form of  diarrhea. Gently hydrate and repeat sodium every 8 hours. Differential include SIADH in the context of recent COVID-19 infection. Get urine sodium, a.m. cortisol, TSH. Continue to monitor.   Hypothyroidism Continue with home Synthroid.   Hyperlipidemia Continue with statin.    Pressure injury Pressure Injury 07/13/21 Coccyx Stage 2 -  Partial thickness loss of dermis presenting as a shallow open injury with a red, pink wound bed without slough. (Active)  07/13/21 0000  Location: Coccyx  Location Orientation:   Staging: Stage 2 -  Partial thickness loss of dermis presenting as a shallow open injury with a red, pink wound bed without slough.  Wound Description (Comments):   Present on Admission: Yes      Wound care will be consulted.       DVT prophylaxis:  Code Status: DNR  Family Communication: none at bedside.  Disposition:   Status is: Inpatient  Remains inpatient appropriate because: dehydration, IV fluids,        Consultants:  Neurology.   Procedures: CTA head and neck.  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 Severe left neural foraminal stenosis at C3-4 and moderate bilateral neural foraminal stenosis at C4-5.  MRI brain:  Chronic right ICA occlusion.   Antimicrobials:None.   Subjective: Intermittent numbness of the hands.   Objective: Vitals:   07/13/21 0715 07/13/21 0745 07/13/21 0954 07/13/21 1130  BP: 103/66 107/70 136/72 137/74  Pulse: (!) 51 63 (!) 50 (!) 54  Resp: 12 17 (!) 9 16  Temp:  TempSrc:      SpO2: 100% 99% 100% 100%    Intake/Output Summary (Last 24 hours) at 07/13/2021 1252 Last data filed at 07/13/2021 1017 Gross per 24 hour  Intake 2600.54 ml  Output --  Net 2600.54 ml   There were no vitals filed for this visit.  Examination:  General exam: Appears calm and comfortable  Respiratory system:  Clear to auscultation. Respiratory effort normal. Cardiovascular system: S1 & S2 heard, RRR. No JVD, No pedal edema. Gastrointestinal system: Abdomen is nondistended, soft and nontender. Normal bowel sounds heard. Central nervous system: Alert and oriented to place and person only. Intermittent numbness of the fingers.  Extremities: Symmetric 5 x 5 power. Skin: No rashes, lesions or ulcers Psychiatry:  Mood & affect appropriate.     Data Reviewed: I have personally reviewed following labs and imaging studies  CBC: Recent Labs  Lab 07/12/21 2126 07/13/21 0338  WBC 4.6 4.1  NEUTROABS 3.7 2.9  HGB 10.9* 9.4*  HCT 31.9* 26.9*  MCV 90.6 90.6  PLT 356 143    Basic Metabolic Panel: Recent Labs  Lab 07/12/21 2126 07/13/21 0338  NA 125* 125*  K 4.8 4.5  CL 87* 93*  CO2 29 25  GLUCOSE 95 55*  BUN 20 16  CREATININE 0.62 0.59*  CALCIUM 9.3 8.5*  MG 2.1 1.8  PHOS  --  3.0    GFR: CrCl cannot be calculated (Unknown ideal weight.).  Liver Function Tests: Recent Labs  Lab 07/12/21 2126 07/13/21 0338  AST 42* 34  ALT 32 27  ALKPHOS 74 59  BILITOT 0.6 0.4  PROT 6.5 5.3*  ALBUMIN 3.3* 2.8*    CBG: No results for input(s): GLUCAP in the last 168 hours.   Recent Results (from the past 240 hour(s))  Blood culture (routine x 2)     Status: None (Preliminary result)   Collection Time: 07/12/21  9:35 PM   Specimen: BLOOD  Result Value Ref Range Status   Specimen Description BLOOD SITE NOT SPECIFIED  Final   Special Requests   Final    BOTTLES DRAWN AEROBIC AND ANAEROBIC Blood Culture adequate volume   Culture   Final    NO GROWTH < 12 HOURS Performed at [facility], [address] [place], [place] [address]    Report Status PENDING  Incomplete  Resp Panel by RT-PCR (Flu A&B, Covid) Nasopharyngeal Swab     Status: None   Collection Time: 07/12/21  9:37 PM   Specimen: Nasopharyngeal Swab; Nasopharyngeal(NP) swabs in vial transport medium  Result Value Ref  Range Status   SARS Coronavirus 2 by RT PCR NEGATIVE NEGATIVE Final    Comment: (NOTE) SARS-CoV-2 target nucleic acids are NOT DETECTED.  The SARS-CoV-2 RNA is generally detectable in upper respiratory specimens during the acute phase of infection. The lowest concentration of SARS-CoV-2 viral copies this assay can detect is 138 copies/mL. A negative result does not preclude SARS-Cov-2 infection and should not be used as the sole basis for treatment or other patient management decisions. A negative result may occur with  improper specimen collection/handling, submission of specimen other than nasopharyngeal swab, presence of viral mutation(s) within the areas targeted by this assay, and inadequate number of viral copies(<138 copies/mL). A negative result must be combined with clinical observations, patient history, and epidemiological information. The expected result is Negative.  Fact Sheet for Patients:  EntrepreneurPulse.com.au  Fact Sheet for Healthcare Providers:  IncredibleEmployment.be  This test is no t yet approved or  cleared by the [place] and  has been authorized for detection and/or diagnosis of SARS-CoV-2 by FDA under an Emergency Use Authorization (EUA). This EUA will remain  in effect (meaning this test can be used) for the duration of the COVID-19 declaration under Section 564(b)(1) of the Act, 21 U.S.C.section 360bbb-3(b)(1), unless the authorization is terminated  or revoked sooner.       Influenza A by PCR NEGATIVE NEGATIVE Final   Influenza B by PCR NEGATIVE NEGATIVE Final    Comment: (NOTE) The Xpert Xpress SARS-CoV-2/FLU/RSV plus assay is intended as an aid in the diagnosis of influenza from Nasopharyngeal swab specimens and should not be used as a sole basis for treatment. Nasal washings and aspirates are unacceptable for Xpert Xpress SARS-CoV-2/FLU/RSV testing.  Fact Sheet for  Patients: EntrepreneurPulse.com.au  Fact Sheet for Healthcare Providers: IncredibleEmployment.be  This test is not yet approved or cleared by the [place] FDA and has been authorized for detection and/or diagnosis of SARS-CoV-2 by FDA under an Emergency Use Authorization (EUA). This EUA will remain in effect (meaning this test can be used) for the duration of the COVID-19 declaration under Section 564(b)(1) of the Act, 21 U.S.C. section 360bbb-3(b)(1), unless the authorization is terminated or revoked.  Performed at [facility], [place] [address] [place], [place] [address]   Blood culture (routine x 2)     Status: None (Preliminary result)   Collection Time: 07/12/21  9:40 PM   Specimen: BLOOD RIGHT FOREARM  Result Value Ref Range Status   Specimen Description BLOOD RIGHT FOREARM  Final   Special Requests   Final    BOTTLES DRAWN AEROBIC AND ANAEROBIC Blood Culture results may not be optimal due to an inadequate volume of blood received in culture bottles   Culture   Final    NO GROWTH < 12 HOURS Performed at [facility], [place] [address], [place],  [address]    Report Status PENDING  Incomplete         Radiology Studies: CT HEAD WO CONTRAST (5MM)  Result Date: 07/13/2021 CLINICAL DATA:  Acute stroke suspected EXAM: CT HEAD WITHOUT CONTRAST TECHNIQUE: Contiguous axial images were obtained from the base of the skull through the vertex without intravenous contrast. RADIATION DOSE REDUCTION: This exam was performed according to the departmental dose-optimization program which includes automated exposure control, adjustment of the mA and/or kV according to patient size and/or use of iterative reconstruction technique. COMPARISON:  None. FINDINGS: Brain: No evidence of acute infarction, hemorrhage, hydrocephalus, extra-axial collection or mass lesion/mass effect. Vascular: No hyperdense vessel or unexpected calcification.  Skull: Normal. Negative for fracture or focal lesion. Sinuses/Orbits: No acute finding. Other: These results were communicated to Dr. Leonel Ramsay at 4:31 am on 07/13/2021 by text page via the [facility] messaging system. IMPRESSION: Negative head CT. Electronically Signed   By: Jorje Guild M.D.   On: 07/13/2021 04:31   MR BRAIN WO CONTRAST  Result Date: 07/13/2021 CLINICAL DATA:  Neuro deficit, acute, stroke suspected. Left upper and lower extremity numbness. History of tonsillar cancer. EXAM: MRI HEAD WITHOUT CONTRAST TECHNIQUE: Multiplanar, multiecho pulse sequences of the brain and surrounding structures were obtained without intravenous contrast. COMPARISON:  Head CT and CTA 07/13/2021 FINDINGS: Brain: There is no evidence of an acute infarct, mass, midline shift, or extra-axial fluid collection. Small T2 hyperintensities in the cerebral white matter bilaterally are nonspecific but compatible with mild chronic small vessel ischemic disease. There may be 1 or 2 chronic microhemorrhages  in the left frontal lobe. The ventricles and sulci are within normal limits for age. Vascular: Abnormal appearance of the distal right internal carotid artery corresponding to known occlusion. Skull and upper cervical spine: Unremarkable bone marrow signal. Sinuses/Orbits: Mild bilateral ethmoid air cell mucosal thickening. Small bilateral mastoid effusions. Other: None. IMPRESSION: 1. No acute intracranial abnormality. 2. Mild chronic small vessel ischemic disease. 3. Chronic right ICA occlusion. Electronically Signed   By: Logan Bores M.D.   On: 07/13/2021 08:56   MR CERVICAL SPINE WO CONTRAST  Result Date: 07/13/2021 CLINICAL DATA:  Myelopathy, acute, cervical spine. Left upper and lower extremity numbness. History of tonsillar cancer. EXAM: MRI CERVICAL SPINE WITHOUT CONTRAST TECHNIQUE: Multiplanar, multisequence MR imaging of the cervical spine was performed. No intravenous contrast was administered. COMPARISON:   Cervical spine CT 07/13/2021 FINDINGS: Alignment: Straightening of the normal cervical lordosis. Trace anterolisthesis of C2 on C3. Vertebrae: No acute fracture. Remote mild T2 superior endplate compression fracture. Prominent fatty marrow changes throughout the spine. Cord: 1.5 mm focus of T2 hypointensity in the right dorsal spinal cord at C2-3 with prominent susceptibility artifact suggesting chronic blood products as there is no calcification visible on CT and no cord edema. Normal cord signal and morphology elsewhere. Posterior Fossa, vertebral arteries, paraspinal tissues: Posterior fossa more fully evaluated on the separate head MRI. Preserved vertebral artery flow voids. Disc levels: C2-3: Anterolisthesis with disc uncovering, uncovertebral spurring, and facet hypertrophy result in mild left neural foraminal stenosis without spinal stenosis. Bilateral facet ankylosis and partial interbody ankylosis. C3-4: Asymmetric left uncovertebral spurring and asymmetric left facet arthrosis result in severe left neural foraminal stenosis without spinal stenosis. Left facet ankylosis and partial interbody ankylosis. C4-5: Uncovertebral spurring and mild-to-moderate facet arthrosis result in moderate bilateral neural foraminal stenosis without spinal stenosis. C5-6: Bridging anterior vertebral osteophytes. Minimal uncovertebral spurring and mild facet arthrosis without significant stenosis. C6-7: Bridging anterior vertebral osteophytes.  No stenosis. C7-T1: Mild facet arthrosis without stenosis. IMPRESSION: 1. 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 2. Cervical spondylosis and facet arthrosis without spinal stenosis. 3. Severe left neural foraminal stenosis at C3-4 and moderate bilateral neural foraminal stenosis at C4-5. Electronically Signed   By: Logan Bores M.D.   On: 07/13/2021 09:33   CT C-SPINE NO CHARGE  Result Date: 07/13/2021 CLINICAL DATA:  Acute stroke suspected EXAM: CT  Cervical Spine with contrast TECHNIQUE: Multiplanar CT images of the cervical spine were reconstructed from contemporary CTA of the Neck. RADIATION DOSE REDUCTION: This exam was performed according to the departmental dose-optimization program which includes automated exposure control, adjustment of the mA and/or kV according to patient size and/or use of iterative reconstruction technique. CONTRAST:  None additional COMPARISON:  None similar FINDINGS: Alignment: No traumatic malalignment. Skull base and vertebrae: No acute fracture or focal bone lesion. Generalized osteopenia. Remote T2 superior endplate fracture. Soft tissues and spinal canal: Post treatment neck. No perispinal or visible intra canal hemorrhage/collection. Disc levels: Degenerative ankylosis at C2-C4. Degenerative ankylosis at C5-C7. Generalized facet spurring. Up to moderate foraminal narrowing at C4-5 bilaterally. Upper chest: Reported separately IMPRESSION: No evidence of acute injury to the cervical spine. Spinal degeneration and multilevel ankylosis. Electronically Signed   By: Jorje Guild M.D.   On: 07/13/2021 05:07   DG Chest Portable 1 View  Result Date: 07/12/2021 CLINICAL DATA:  Concern for pneumonia. EXAM: PORTABLE CHEST 1 VIEW COMPARISON:  Chest radiograph dated 06/17/2021. FINDINGS: Left retrocardiac consolidation with air bronchograms may represent  atelectasis or infiltrate. Clinical correlation recommended. No large pleural effusion. No pneumothorax. Mild cardiomegaly. No acute osseous pathology. IMPRESSION: Left retrocardiac consolidation may represent atelectasis or infiltrate. Electronically Signed   By: Anner Crete M.D.   On: 07/12/2021 22:11   CT ANGIO HEAD NECK W WO CM (CODE STROKE)  Result Date: 07/13/2021 CLINICAL DATA:  Acute stroke suspected EXAM: CT ANGIOGRAPHY HEAD AND NECK TECHNIQUE: Multidetector CT imaging of the head and neck was performed using the standard protocol during bolus administration of  intravenous contrast. Multiplanar CT image reconstructions and MIPs were obtained to evaluate the vascular anatomy. Carotid stenosis measurements (when applicable) are obtained utilizing NASCET criteria, using the distal internal carotid diameter as the denominator. RADIATION DOSE REDUCTION: This exam was performed according to the departmental dose-optimization program which includes automated exposure control, adjustment of the mA and/or kV according to patient size and/or use of iterative reconstruction technique. CONTRAST:  Dose is not known on this in progress study COMPARISON:  CTA of the neck 06/19/2016 FINDINGS: CTA NECK FINDINGS Aortic arch: Partial coverage shows diffuse atheromatous plaque with 3 vessel branching. Right carotid system: Bulky plaque at the bifurcation with chronic proximal ICA occlusion continuing to the intracranial segments. No interval change Left carotid system: Diffuse atheromatous wall thickening and intermittent calcification. Interval stenting of the proximal left ICA with no evidence of in stent stenosis or new narrowing. Vertebral arteries: Proximal subclavian atherosclerosis without flow reducing stenosis. Bilateral vertebral origin plaque measuring at least 60% on coronal reformats. Mild downstream plaque without significant V2 or V3 segment stenosis. No dissection. Skeleton: Cervical spine degeneration with multilevel ankylosis. 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 Neck dissection on the right at least Upper chest: Biapical radiation fibrosis.  No acute finding. Review of the MIP images confirms the above findings CTA HEAD FINDINGS Anterior circulation: Reconstituted right ICA at the paraclinoid segment where the ophthalmic arteries enhancing and where there is a large right posterior communicating artery. Anterior communicating artery is also present. No branch occlusion, beading, or aneurysm. Posterior  circulation: Right dominant vertebral artery. The vertebral and basilar arteries are smoothly contoured and widely patent. No branch occlusion, beading, or aneurysm. Venous sinuses: Diffusely patent Anatomic variants: None significant Review of the MIP images confirms the above findings IMPRESSION: 1. No emergent finding. 2. 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 Intact circle-of-Willis. 3. Left proximal carotid stenting since prior. No in stent stenosis. 4. 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 Electronically Signed   By: Jorje Guild M.D.   On: 07/13/2021 04:41        Scheduled Meds:  aspirin  81 mg Per Tube QPM   atorvastatin  20 mg Per Tube Daily   levothyroxine  75 mcg Per Tube QAC breakfast   loratadine  10 mg Per Tube Daily   midodrine  5 mg Per Tube TID WC   Continuous Infusions:   LOS: 0 days        Hosie Poisson, MD [facility]   To contact the attending provider between 7A-7P or the covering provider during after hours 7P-7A, please log into the web site [email or web address] and access using universal  password for that web site. If you do not have the password, please call the hospital operator.  07/13/2021, 12:52 PM

## 2021-07-13 NOTE — Progress Notes (Addendum)
Pt admitted to [facility] from [facility] ED.  Pt is A&OX4 and neuro intact with slight left mouth drooping and mild slurred speech.  Pt was evaluated for these symptoms prior in ED.  Vitals taken and within normal range with exceptions of Temp 97.4 and Hr 56.  Pt placed on telemetry and CCMD notified.  CHG bath completed. Pt is currently not in pain, oriented to unit and call light within reach.     07/13/21 1719  Vitals  Temp (!) 97.4 F (36.3 C)  Temp Source Oral  BP 138/82  MAP (mmHg) 100  BP Location Right Arm  BP Method Automatic  Patient Position (if appropriate) Lying  Pulse Rate (!) 58  Pulse Rate Source Monitor  ECG Heart Rate (!) 56  Resp 16  Level of Consciousness  Level of Consciousness Alert  Oxygen Therapy  SpO2 100 %  O2 Device Nasal Cannula  O2 Flow Rate (L/min) 2 L/min  Pain Assessment  Pain Scale 0-10  Pain Score 0  POSS Scale (Pasero Opioid Sedation Scale)  POSS *See Group Information* 1-Acceptable,Awake and alert  PCA/Epidural/Spinal Assessment  Respiratory Pattern Regular;Unlabored  Glasgow Coma Scale  Eye Opening 4  Best Verbal Response (NON-intubated) 5  Best Motor Response 6  Glasgow Coma Scale Score 15  MEWS Score  MEWS Temp 0  MEWS Systolic 0  MEWS Pulse 0  MEWS RR 0  MEWS LOC 0  MEWS Score 0  MEWS Score Color Green

## 2021-07-13 NOTE — Progress Notes (Addendum)
STROKE TEAM PROGRESS NOTE   ATTENDING NOTE: I reviewed above note and agree with the assessment and plan. Pt was seen and examined.   76 year old male with history of tonsil cancer status post radiation, hypertension, OSA, chronic right carotid occlusion, left ICA status post stenting admitted for generalized weakness, hypotension and bilateral hand numbness.  Patient denies any unilateral symptoms.  Of note, on presentation patient has low BP and hypothermia, now improved after IV fluid resuscitation.  Patient did complain of diarrhea at home and decreased p.o. intake.  CT no acute abnormality.  CTA head and neck showed right ICA chronic occlusion, left ICA stent patent, bilateral VA origin 60% stenosis.  MRI showed no acute infarct.  EF 55 to 60%.  Sodium 125, creatinine 0.59, hemoglobin 9.4.  LDL 41 and A1c 5.7.  On exam, patient lying in bed, no family at bedside.  Patient has severe dysarthria and mild drowsiness, not a good historian but orientated to self, age and place.  However not orientated to time.  Follows all simple commands.  No gaze palsy, visual fields full.  However found to have right nasolabial fold flattening, which patient denies to be chronic and no documentation in the previous notes.  Tongue midline.  Bilateral upper extremity 3/5, bilateral lower extremity proximal 2/5, distally 4/5.  Sensation subjectively symmetrical, finger-to-nose bilaterally grossly intact however very slow in action.  Etiology with patient symptoms most likely due to hypotension, hypothermia, lethargy.  MRI negative for acute stroke.  However he does have right nasolabial fold flattening, could be MRI negative small stroke in the setting of hypotension.  Recommend aspirin 81 and Plavix 75 DAPT for 3 weeks and then aspirin alone.  Continue Lipitor 20.  Continue midodrine and avoid low BP.  Diarrhea management per EDP.  Discussed with Dr. Karleen Hampshire.  For detailed assessment and plan, please refer to above as I  have made changes wherever appropriate.   Neurology will sign off. Please call with questions. Pt will follow up with stroke clinic NP at [facility] in about 4 weeks. Thanks for the consult.   Alexander Hawking, MD PhD Stroke Neurology 07/13/2021 7:23 PM    INTERVAL HISTORY No family at the bedside.  Patient appeared drowsy and agitated. Patient reports bilateral finger numbness and generalized weakness after having severe diarrhea for past few days.  Vitals:   07/13/21 0630 07/13/21 0645 07/13/21 0700 07/13/21 0715  BP: 109/70 112/72 105/66 103/66  Pulse: (!) 58 (!) 57 (!) 56 (!) 51  Resp: 16 16 18 12   Temp:      TempSrc:      SpO2: 98% 100% 100% 100%   CBC:  Recent Labs  Lab 07/12/21 2126 07/13/21 0338  WBC 4.6 4.1  NEUTROABS 3.7 2.9  HGB 10.9* 9.4*  HCT 31.9* 26.9*  MCV 90.6 90.6  PLT 356 448   Basic Metabolic Panel:  Recent Labs  Lab 07/12/21 2126 07/13/21 0338  NA 125* 125*  K 4.8 4.5  CL 87* 93*  CO2 29 25  GLUCOSE 95 55*  BUN 20 16  CREATININE 0.62 0.59*  CALCIUM 9.3 8.5*  MG 2.1 1.8  PHOS  --  3.0   Lipid Panel: No results for input(s): CHOL, TRIG, HDL, CHOLHDL, VLDL, LDLCALC in the last 168 hours. HgbA1c: No results for input(s): HGBA1C in the last 168 hours. Urine Drug Screen: No results for input(s): LABOPIA, COCAINSCRNUR, LABBENZ, AMPHETMU, THCU, LABBARB in the last 168 hours.  Alcohol Level No results for input(s): [facility]  in the last 168 hours.  IMAGING past 24 hours CT HEAD WO CONTRAST (5MM)  Result Date: 07/13/2021 CLINICAL DATA:  Acute stroke suspected EXAM: CT HEAD WITHOUT CONTRAST TECHNIQUE: Contiguous axial images were obtained from the base of the skull through the vertex without intravenous contrast. RADIATION DOSE REDUCTION: This exam was performed according to the departmental dose-optimization program which includes automated exposure control, adjustment of the mA and/or kV according to patient size and/or use of iterative reconstruction technique.  COMPARISON:  None. FINDINGS: Brain: No evidence of acute infarction, hemorrhage, hydrocephalus, extra-axial collection or mass lesion/mass effect. Vascular: No hyperdense vessel or unexpected calcification. Skull: Normal. Negative for fracture or focal lesion. Sinuses/Orbits: No acute finding. Other: These results were communicated to Dr. Leonel Ramsay at 4:31 am on 07/13/2021 by text page via the [facility] messaging system. IMPRESSION: Negative head CT. Electronically Signed   By: Jorje Guild M.D.   On: 07/13/2021 04:31   CT C-SPINE NO CHARGE  Result Date: 07/13/2021 CLINICAL DATA:  Acute stroke suspected EXAM: CT Cervical Spine with contrast TECHNIQUE: Multiplanar CT images of the cervical spine were reconstructed from contemporary CTA of the Neck. RADIATION DOSE REDUCTION: This exam was performed according to the departmental dose-optimization program which includes automated exposure control, adjustment of the mA and/or kV according to patient size and/or use of iterative reconstruction technique. CONTRAST:  None additional COMPARISON:  None similar FINDINGS: Alignment: No traumatic malalignment. Skull base and vertebrae: No acute fracture or focal bone lesion. Generalized osteopenia. Remote T2 superior endplate fracture. Soft tissues and spinal canal: Post treatment neck. No perispinal or visible intra canal hemorrhage/collection. Disc levels: Degenerative ankylosis at C2-C4. Degenerative ankylosis at C5-C7. Generalized facet spurring. Up to moderate foraminal narrowing at C4-5 bilaterally. Upper chest: Reported separately IMPRESSION: No evidence of acute injury to the cervical spine. Spinal degeneration and multilevel ankylosis. Electronically Signed   By: Jorje Guild M.D.   On: 07/13/2021 05:07   DG Chest Portable 1 View  Result Date: 07/12/2021 CLINICAL DATA:  Concern for pneumonia. EXAM: PORTABLE CHEST 1 VIEW COMPARISON:  Chest radiograph dated 06/17/2021. FINDINGS: Left retrocardiac consolidation  with air bronchograms may represent atelectasis or infiltrate. Clinical correlation recommended. No large pleural effusion. No pneumothorax. Mild cardiomegaly. No acute osseous pathology. IMPRESSION: Left retrocardiac consolidation may represent atelectasis or infiltrate. Electronically Signed   By: Anner Crete M.D.   On: 07/12/2021 22:11   CT ANGIO HEAD NECK W WO CM (CODE STROKE)  Result Date: 07/13/2021 CLINICAL DATA:  Acute stroke suspected EXAM: CT ANGIOGRAPHY HEAD AND NECK TECHNIQUE: Multidetector CT imaging of the head and neck was performed using the standard protocol during bolus administration of intravenous contrast. Multiplanar CT image reconstructions and MIPs were obtained to evaluate the vascular anatomy. Carotid stenosis measurements (when applicable) are obtained utilizing NASCET criteria, using the distal internal carotid diameter as the denominator. RADIATION DOSE REDUCTION: This exam was performed according to the departmental dose-optimization program which includes automated exposure control, adjustment of the mA and/or kV according to patient size and/or use of iterative reconstruction technique. CONTRAST:  Dose is not known on this in progress study COMPARISON:  CTA of the neck 06/19/2016 FINDINGS: CTA NECK FINDINGS Aortic arch: Partial coverage shows diffuse atheromatous plaque with 3 vessel branching. Right carotid system: Bulky plaque at the bifurcation with chronic proximal ICA occlusion continuing to the intracranial segments. No interval change Left carotid system: Diffuse atheromatous wall thickening and intermittent calcification. Interval stenting of the proximal left ICA with no evidence of  in stent stenosis or new narrowing. Vertebral arteries: Proximal subclavian atherosclerosis without flow reducing stenosis. Bilateral vertebral origin plaque measuring at least 60% on coronal reformats. Mild downstream plaque without significant V2 or V3 segment stenosis. No dissection.  Skeleton: Cervical spine degeneration with multilevel ankylosis. 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 Neck dissection on the right at least Upper chest: Biapical radiation fibrosis.  No acute finding. Review of the MIP images confirms the above findings CTA HEAD FINDINGS Anterior circulation: Reconstituted right ICA at the paraclinoid segment where the ophthalmic arteries enhancing and where there is a large right posterior communicating artery. Anterior communicating artery is also present. No branch occlusion, beading, or aneurysm. Posterior circulation: Right dominant vertebral artery. The vertebral and basilar arteries are smoothly contoured and widely patent. No branch occlusion, beading, or aneurysm. Venous sinuses: Diffusely patent Anatomic variants: None significant Review of the MIP images confirms the above findings IMPRESSION: 1. No emergent finding. 2. 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 Intact circle-of-Willis. 3. Left proximal carotid stenting since prior. No in stent stenosis. 4. 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 Electronically Signed   By: Jorje Guild M.D.   On: 07/13/2021 04:41    PHYSICAL EXAM  Temp:  [93.7 F (34.3 C)-97.5 F (36.4 C)] 97.5 F (36.4 C) (01/27 0445) Pulse Rate:  [48-72] 50 (01/27 1245) Resp:  [8-21] 18 (01/27 1245) BP: (84-146)/(52-85) 126/71 (01/27 1245) SpO2:  [97 %-100 %] 100 % (01/27 1245)  General - ill-appearing elderly caucasian male.  Cardiovascular - Regular rhythm and rate.  Mental Status -  Level of arousal and orientation to time, place, and person were intact. Language including expression, naming, repetition, comprehension was assessed and found intact. Attention span and concentration were normal. Recent and remote memory were intact. Fund of Knowledge was assessed and was intact.  Cranial Nerves II - XII  - II - Visual field intact OU. III, IV, VI - Extraocular movements intact. V - Facial sensation intact bilaterally. VII - Facial movement intact bilaterally. VIII - Hearing & vestibular intact bilaterally. X - Palate elevates symmetrically. XI - Chin turning & shoulder shrug intact bilaterally. XII - Tongue protrusion intact.  Motor Strength - The patients strength was normal in all extremities and pronator drift was absent.  Bulk was normal and fasciculations were absent.   Motor Tone - Muscle tone was assessed at the neck and appendages and was normal.  Reflexes - The patients reflexes were symmetrical in all extremities and he had no pathological reflexes.  Sensory - Numbness in bilateral thumb, pointer and middle fingers. No sensory deficits in LE.  Coordination - The patient had normal movements in the hands and feet with no ataxia or dysmetria.  Tremor was absent.  Gait and Station - deferred.  ASSESSMENT/PLAN Mr. ORVA GWALTNEY is a 76 y.o. male with history of tonsillar cancer s/p radiation, R carotid occlusion, HLD, hypothyroidism, chronic back pain, allergic rhinitis presenting with dehydration and severe diarrhea.   Generalized weakness:  likely due to hypotension and dehydration from diarrhea CT head No acute abnormality.  CTA head & neck: No emergent finding, chronic R ICA occlusion, intact circle of Willis CT Cspine: no acute injury in cervical spine. Spinal degeneration and multilevel ankylosis. MRI C Spine: Punctate focus of likely chronic hemorrhage in the spinal cord at C2-3. Cervical spondylosis and facet arthrosis without spinal stenosis. Severe left neural foraminal stenosis  at C3-4 and moderate bilateral neural foraminal stenosis at C4-5. MRI  No acute intracranial abnormality. Mild chronic small vessel ischemic disease. Chronic right ICA occlusion. 2D Echo  EF 55-60% LDL 41 HgbA1c 5.7 VTE prophylaxis - SCDs aspirin 81 mg daily prior to admission, now on  aspirin 81 mg daily and clopidogrel 75 mg daily x 3 weeks then ASA alone Therapy recommendations:  pending Disposition:  pending  Hypotension Home meds:  midodrine Stable Avoid low BP Long-term BP goal normotensive  Hyperlipidemia Home meds:  Lipitor 20, resumed in hospital LDL 41 goal < 70 Continue statin at discharge  Other Stroke Risk Factors Advanced Age >/= 57  Obstructive sleep apnea, on CPAP at home  Other Active Problems Acute Diarrhea Dehydration Hyponatremia Generalized Weakness  Hospital day # 0  France Ravens, MD PGY1 Resident  To contact Stroke Continuity provider, please refer to [email or web address] After hours, contact General Neurology

## 2021-07-13 NOTE — Progress Notes (Signed)
Notified by RN that pt developed left arm numbness approximately 30 minutes before I was notified. Also has BP that is soft.   Examined pt at bedside. Pt with weakness of left arm and leg and cannot hold them up. Has mild asymmetry of face. Minimal extension of tongue. Decreased sensation to touch in left hand/arm.  Activated code stroke. Neurology present at bedside.   CT head CTA Head and neck ordered. Neurology will have CT configure a CT neck from CTA films to evaluate C spine.

## 2021-07-13 NOTE — ED Notes (Addendum)
Pt began to complain of left hand and arm numbness at approx 0310. Contacted admitting docter and then was told to contact house coverage. MD notified and advised to activate Code Stroke. Pt also noted to be hypotensive. MD aware and 549ml LR bolus ordered.

## 2021-07-13 NOTE — Evaluation (Signed)
Physical Therapy Evaluation Patient Details Name: Alexander Duran MRN: [id number] DOB: 1945-09-04 Today's Date: 07/13/2021  History of Present Illness  Pt is a 76 y/o male presenting on 1/26 with diarrhea.  Admitted with dehydration, complicated by generalized weakness and hyponatremia. Noted recent admission 06/17/21 for covid infection. L sided numbness and hypotension began 1/27, code stroke activated. CT/CTA negative. MRI brain/cervical spine negative for acute abnormalities. 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  Clinical Impression  Pt admitted with above diagnosis. Pt is questionable historian but reports he is able to ambulate without DME.  Pt with very cachetic appearance.  Evaluation was very limited due to pt not willing to participate.  He was initially pleasant with OT but when PT entered, pt became agitated and stating he would not move today; only agreed to repositioning and extremity testing.  Pt again pleasant at end of session. Suspect pt will need assist for transfers.  AM-PAC difficult to grade due to pt not participating - scored a 7 but needs further assessment.  Pt currently with functional limitations due to the deficits listed below (see PT Problem List). Pt will benefit from skilled PT to increase their independence and safety with mobility to allow discharge to the venue listed below.          Recommendations for follow up therapy are one component of a multi-disciplinary discharge planning process, led by the attending physician.  Recommendations may be updated based on patient status, additional functional criteria and insurance authorization.  Follow Up Recommendations Skilled nursing-short term rehab (<3 hours/day)    Assistance Recommended at Discharge Frequent or constant Supervision/Assistance  Patient can return home with the following  Two people to help with walking and/or transfers;Two people to help with  bathing/dressing/bathroom    Equipment Recommendations Other (comment) (needs further assessment)  Recommendations for Other Services       Functional Status Assessment Patient has had a recent decline in their functional status and demonstrates the ability to make significant improvements in function in a reasonable and predictable amount of time.     Precautions / Restrictions Precautions Precautions: Fall Precaution Comments: g-tube      Mobility  Bed Mobility               General bed mobility comments: pt adamently/cursing declined OOB, total +2 to reposition in bed    Transfers                        Ambulation/Gait                  Stairs            Wheelchair Mobility    Modified Rankin (Stroke Patients Only)       Balance       Sitting balance - Comments: refused                                     Pertinent Vitals/Pain Pain Assessment Pain Assessment: Faces Faces Pain Scale: Hurts little more Pain Location: back, neck, shoulders Pain Descriptors / Indicators: Discomfort, Guarding, Aching Pain Intervention(s): Limited activity within patient's tolerance, Monitored during session, Repositioned    Home Living Family/patient expects to be discharged to:: Private residence Living Arrangements: Spouse/significant other Available Help at Discharge: Family;Available 24 hours/day Type of Home: House Home Access: Stairs to enter Entrance  Stairs-Rails: None Entrance Stairs-Number of Steps: 4 steps   Home Layout: One level Home Equipment: Conservation officer, nature (2 wheels);BSC/3in1 Additional Comments: somewhat questionable historian    Prior Function Prior Level of Function : Independent/Modified Independent (pt reports doing well prior to onset of diarrhea, since has declined)             Mobility Comments: Reports ambulating with no DME ADLs Comments: Reports independent     Hand Dominance         Extremity/Trunk Assessment   Upper Extremity Assessment Upper Extremity Assessment: Defer to OT evaluation    Lower Extremity Assessment Lower Extremity Assessment: LLE deficits/detail;RLE deficits/detail;Difficult to assess due to impaired cognition RLE Deficits / Details: Pt with limited williningness to participate.  ROM: grossly WFL but reports painful in hips/groin bil; MMT: 1/5 throughout but limited effort from pt LLE Deficits / Details: Pt with limited williningness to participate.  ROM: grossly WFL but reports painful in hips/groin bil; MMT: 1/5 throughout but limited effort from pt    Cervical / Trunk Assessment Cervical / Trunk Assessment: Kyphotic  Communication   Communication: No difficulties  Cognition Arousal/Alertness: Awake/alert Behavior During Therapy: Agitated, Flat affect Overall Cognitive Status: Impaired/Different from baseline Area of Impairment: Awareness, Problem solving                           Awareness: Emergent Problem Solving: Slow processing, Difficulty sequencing, Requires verbal cues General Comments: pt with decreased awareness and problem solving. initally pleasant but becamse very agitated when asked home setup questions reporting "can't you see that in the record?" and cursing/agitated when told from physical therapy - stating "I'm not getting up", comments about torture, letting him rest.  Again, became pleasant when just assisted with repositioning        General Comments General comments (skin integrity, edema, etc.): On 2 L VSS    Exercises     Assessment/Plan    PT Assessment Patient needs continued PT services  PT Problem List Decreased strength;Decreased balance;Decreased mobility;Decreased knowledge of use of DME;Cardiopulmonary status limiting activity;Decreased safety awareness;Decreased range of motion;Decreased activity tolerance;Decreased cognition;Pain       PT Treatment Interventions DME instruction;Gait  training;Functional mobility training;Therapeutic activities;Therapeutic exercise;Balance training;Patient/family education;Cognitive remediation    PT Goals (Current goals can be found in the Care Plan section)  Acute Rehab PT Goals Patient Stated Goal: rest, be left alone PT Goal Formulation: With patient Time For Goal Achievement: 07/27/21 Potential to Achieve Goals: Fair    Frequency Min 2X/week     Co-evaluation PT/OT/SLP Co-Evaluation/Treatment: Yes Reason for Co-Treatment: For patient/therapist safety (limited williningness to work with therapy today) PT goals addressed during session: Strengthening/ROM         AM-PAC PT "6 Clicks" Mobility  Outcome Measure Help needed turning from your back to your side while in a flat bed without using bedrails?: A Lot Help needed moving from lying on your back to sitting on the side of a flat bed without using bedrails?: Total Help needed moving to and from a bed to a chair (including a wheelchair)?: Total Help needed standing up from a chair using your arms (e.g., wheelchair or bedside chair)?: Total Help needed to walk in hospital room?: Total Help needed climbing 3-5 steps with a railing? : Total 6 Click Score: 7    End of Session   Activity Tolerance: Other (comment);Patient limited by fatigue (Self limiting) Patient left: in bed;with call bell/phone within reach (in  ED) Nurse Communication: Mobility status PT Visit Diagnosis: Other abnormalities of gait and mobility (R26.89);Muscle weakness (generalized) (M62.81)    Time: 6578-4696 PT Time Calculation (min) (ACUTE ONLY): 20 min   Charges:   PT Evaluation $PT Eval Low Complexity: 1 Low          Kambria Grima, PT Acute Rehab Services Pager [phone] [facility] [phone]   Karlton Lemon 07/13/2021, 2:15 PM

## 2021-07-13 NOTE — Evaluation (Signed)
Occupational Therapy Evaluation Patient Details Name: Alexander Duran MRN: [id number] DOB: 1945-07-08 Today's Date: 07/13/2021   History of Present Illness Pt is a 76 y/o male presenting on 1/26 with diarrhea.  Admitted with dehydration, complicated by generalized weakness and hyponatremia. Noted recent admission 06/17/21 for covid infection. L sided numbness and hypotension began 1/27, code stroke activated. CT/CTA negative. MRI brain/cervical spine negative for acute abnormalities. 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   Clinical Impression   Patient admitted for above and limited by problem list below, including generalized weakness, decreased activity tolerance, and pain.  He reports prior to having diarrhea, he was doing well taking care of himself and moving using RW.  He currently requires +2 total assist to reposition in bed, requires total assist for self care from bed level.  He is able to squeeze with bilateral hands, but unable to maintain arms raised against gravity or bring hand to mouth without assist(question participation, as pt able to move R UE to reach call bell at end of session).  He is oriented and follows commands, but limited engagement with therapist as pt becomes agitated when confirming situation, home setup.  Based on performance today, believe he will benefit from continued OT services acutely and after dc at SNF level to optimize return to PLOF with ADLs and mobility.      Recommendations for follow up therapy are one component of a multi-disciplinary discharge planning process, led by the attending physician.  Recommendations may be updated based on patient status, additional functional criteria and insurance authorization.   Follow Up Recommendations  Skilled nursing-short term rehab (<3 hours/day)    Assistance Recommended at Discharge Frequent or constant Supervision/Assistance  Patient can return home with the  following Two people to help with walking and/or transfers;Two people to help with bathing/dressing/bathroom    Functional Status Assessment  Patient has had a recent decline in their functional status and demonstrates the ability to make significant improvements in function in a reasonable and predictable amount of time.  Equipment Recommendations  Wheelchair (measurements OT);Wheelchair cushion (measurements OT);Hospital bed    Recommendations for Other Services       Precautions / Restrictions Precautions Precautions: Fall Precaution Comments: g-tube Restrictions Weight Bearing Restrictions: No      Mobility Bed Mobility Overal bed mobility: Needs Assistance             General bed mobility comments: pt declined OOB, total +2 to reposition in bed    Transfers                          Balance                                           ADL either performed or assessed with clinical judgement   ADL Overall ADL's : Needs assistance/impaired                                     Functional mobility during ADLs: Total assistance;+2 for physical assistance;+2 for safety/equipment General ADL Comments: total assist for all self care at this time     Vision   Vision Assessment?: No apparent visual deficits     Perception     Praxis  Pertinent Vitals/Pain Pain Assessment Pain Assessment: Faces Faces Pain Scale: Hurts little more Pain Location: back, neck, shoulders Pain Descriptors / Indicators: Discomfort, Guarding, Aching Pain Intervention(s): Limited activity within patient's tolerance, Monitored during session, Repositioned     Hand Dominance Right   Extremity/Trunk Assessment Upper Extremity Assessment Upper Extremity Assessment: Generalized weakness (difficutly to assess due to pt participation; grossly 3-/5 grasp but unable to hold arms up against gravity.  when pt wants to move R arm to reach for call  bell, he is able)   Lower Extremity Assessment Lower Extremity Assessment: Defer to PT evaluation   Cervical / Trunk Assessment Cervical / Trunk Assessment: Kyphotic   Communication Communication Communication: No difficulties   Cognition Arousal/Alertness: Awake/alert Behavior During Therapy: Agitated, Flat affect Overall Cognitive Status: Impaired/Different from baseline Area of Impairment: Awareness, Problem solving                           Awareness: Emergent Problem Solving: Slow processing, Difficulty sequencing, Requires verbal cues General Comments: pt with decreased awareness and problem solving. initally pleasant but becamse very agitated when asked home setup questions reporting "can't you see that in the record?"     General Comments  on 2L VSS    Exercises     Shoulder Instructions      Home Living Family/patient expects to be discharged to:: Private residence Living Arrangements: Spouse/significant other Available Help at Discharge: Family;Available 24 hours/day Type of Home: House Home Access: Stairs to enter [employer] of Steps: 4 steps Entrance Stairs-Rails: None Home Layout: One level     Bathroom Shower/Tub: Teacher, early years/pre: Standard     Home Equipment: Conservation officer, nature (2 wheels);BSC/3in1          Prior Functioning/Environment Prior Level of Function : Independent/Modified Independent (pt reports doing well prior to onset of diarrhea, since has declined)             Mobility Comments: Reports ambulating with no DME ADLs Comments: Reports independent        OT Problem List: Decreased strength;Decreased range of motion;Decreased activity tolerance;Impaired balance (sitting and/or standing);Decreased cognition;Decreased safety awareness;Decreased knowledge of use of DME or AE;Decreased knowledge of precautions;Cardiopulmonary status limiting activity;Pain;Impaired UE functional use      OT  Treatment/Interventions: Self-care/ADL training;Therapeutic exercise;DME and/or AE instruction;Therapeutic activities;Cognitive remediation/compensation;Patient/family education;Balance training    OT Goals(Current goals can be found in the care plan section) Acute Rehab OT Goals Patient Stated Goal: get stronger/feel better OT Goal Formulation: With patient Time For Goal Achievement: 07/27/21 Potential to Achieve Goals: Fair  OT Frequency: Min 2X/week    Co-evaluation PT/OT/SLP Co-Evaluation/Treatment: Yes Reason for Co-Treatment: For patient/therapist safety;To address functional/ADL transfers   OT goals addressed during session: ADL's and self-care      AM-PAC OT "6 Clicks" Daily Activity     Outcome Measure Help from another person eating meals?: Total Help from another person taking care of personal grooming?: Total Help from another person toileting, which includes using toliet, bedpan, or urinal?: Total Help from another person bathing (including washing, rinsing, drying)?: Total Help from another person to put on and taking off regular upper body clothing?: Total Help from another person to put on and taking off regular lower body clothing?: Total 6 Click Score: 6   End of Session Nurse Communication: Mobility status  Activity Tolerance: Patient limited by fatigue;Patient limited by pain;Other (comment) (and participation) Patient left: in bed;with call bell/phone within  reach  OT Visit Diagnosis: Other abnormalities of gait and mobility (R26.89);Muscle weakness (generalized) (M62.81);Pain Pain - part of body:  (neck, shoulders, back)                Time: 8473-0856 OT Time Calculation (min): 19 min Charges:  OT General Charges $OT Visit: 1 Visit OT Evaluation $OT Eval Moderate Complexity: 1 Mod  Jolaine Artist, OT Acute Rehabilitation Services Pager [phone] Office [phone]   Delight Stare 07/13/2021, 12:02 PM

## 2021-07-14 ENCOUNTER — Inpatient Hospital Stay (HOSPITAL_COMMUNITY): Payer: No Typology Code available for payment source

## 2021-07-14 DIAGNOSIS — R531 Weakness: Secondary | ICD-10-CM | POA: Diagnosis not present

## 2021-07-14 DIAGNOSIS — R197 Diarrhea, unspecified: Secondary | ICD-10-CM | POA: Diagnosis not present

## 2021-07-14 DIAGNOSIS — E871 Hypo-osmolality and hyponatremia: Secondary | ICD-10-CM | POA: Diagnosis not present

## 2021-07-14 DIAGNOSIS — E860 Dehydration: Secondary | ICD-10-CM | POA: Diagnosis not present

## 2021-07-14 LAB — CBC
HCT: 27.3 % — ABNORMAL LOW (ref 39.0–52.0)
Hemoglobin: 9.7 g/dL — ABNORMAL LOW (ref 13.0–17.0)
MCH: 32.1 pg (ref 26.0–34.0)
MCHC: 35.5 g/dL (ref 30.0–36.0)
MCV: 90.4 fL (ref 80.0–100.0)
Platelets: 297 10*3/uL (ref 150–400)
RBC: 3.02 MIL/uL — ABNORMAL LOW (ref 4.22–5.81)
RDW: 15.7 % — ABNORMAL HIGH (ref 11.5–15.5)
WBC: 2.8 10*3/uL — ABNORMAL LOW (ref 4.0–10.5)
nRBC: 0 % (ref 0.0–0.2)

## 2021-07-14 LAB — IRON AND TIBC
Iron: 53 ug/dL (ref 45–182)
Saturation Ratios: 18 % (ref 17.9–39.5)
TIBC: 295 ug/dL (ref 250–450)
UIBC: 242 ug/dL

## 2021-07-14 LAB — BASIC METABOLIC PANEL
Anion gap: 10 (ref 5–15)
BUN: 15 mg/dL (ref 8–23)
CO2: 21 mmol/L — ABNORMAL LOW (ref 22–32)
Calcium: 8.1 mg/dL — ABNORMAL LOW (ref 8.9–10.3)
Chloride: 96 mmol/L — ABNORMAL LOW (ref 98–111)
Creatinine, Ser: 0.63 mg/dL (ref 0.61–1.24)
GFR, Estimated: >60 mL/min (ref >60)
Glucose, Bld: 43 mg/dL — CL (ref 70–99)
Potassium: 4.2 mmol/L (ref 3.5–5.1)
Sodium: 127 mmol/L — ABNORMAL LOW (ref 135–145)

## 2021-07-14 LAB — LACTIC ACID, PLASMA
Lactic Acid, Venous: 0.5 mmol/L (ref 0.5–1.9)
Lactic Acid, Venous: 0.7 mmol/L (ref 0.5–1.9)

## 2021-07-14 LAB — GLUCOSE, CAPILLARY
Glucose-Capillary: 140 mg/dL — ABNORMAL HIGH (ref 70–99)
Glucose-Capillary: 92 mg/dL (ref 70–99)
Glucose-Capillary: 80 mg/dL (ref 70–99)

## 2021-07-14 LAB — T3, FREE: T3, Free: 0.6 pg/mL — ABNORMAL LOW (ref 2.0–4.4)

## 2021-07-14 LAB — RETICULOCYTES
Immature Retic Fract: 8.6 % (ref 2.3–15.9)
RBC.: 3.33 MIL/uL — ABNORMAL LOW (ref 4.22–5.81)
Retic Count, Absolute: 59.9 10*3/uL (ref 19.0–186.0)
Retic Ct Pct: 1.8 % (ref 0.4–3.1)

## 2021-07-14 LAB — SODIUM
Sodium: 129 mmol/L — ABNORMAL LOW (ref 135–145)
Sodium: 127 mmol/L — ABNORMAL LOW (ref 135–145)

## 2021-07-14 LAB — FERRITIN: Ferritin: 287 ng/mL (ref 24–336)

## 2021-07-14 LAB — FOLATE: Folate: 43.6 ng/mL (ref >5.9)

## 2021-07-14 LAB — VITAMIN B12: Vitamin B-12: 731 pg/mL (ref 180–914)

## 2021-07-14 IMAGING — DX DG CHEST 1V PORT
1 series · 1 of 1 positions shown · non-contrast
Comparison: [DATE] and older studies.

CLINICAL DATA: Fever Evaluate atelectasis or infiltrate. pleural
effusion. pneumothorax.

EXAM:
PORTABLE CHEST 1 VIEW

[chest]
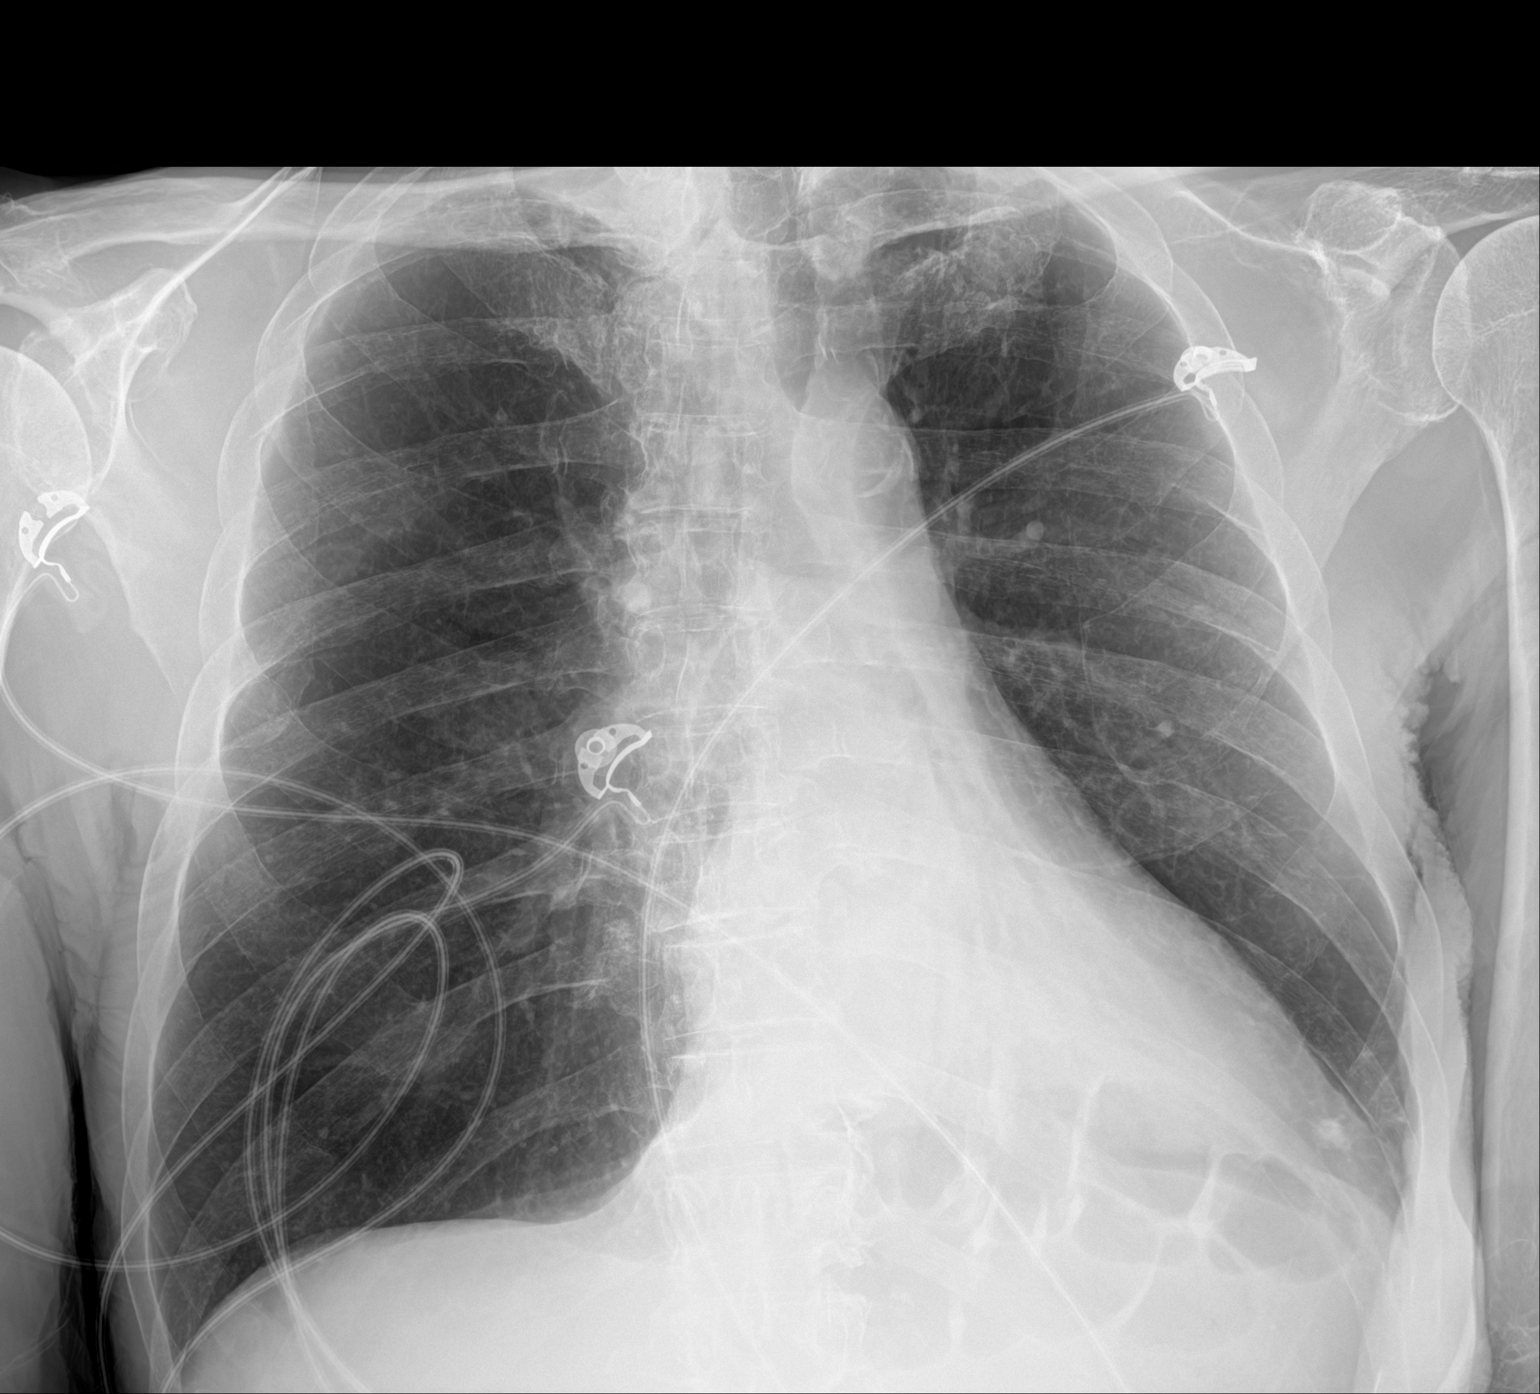

[1 of 1 positions shown; findings below may reference images not displayed]

FINDINGS: Left retrocardiac lung base opacity is similar to the most recent
prior exam. Remainder of the lungs is clear. Lungs are
hyperexpanded.

Possible small left effusion.  No evidence of a pneumothorax.

Cardiac silhouette normal in size.  No mediastinal or hilar masses.
IMPRESSION: 1. No significant change from the most recent prior study.
2. Left lower lobe, retrocardiac, opacity is consistent with
pneumonia, atelectasis or a combination.

## 2021-07-14 MED ORDER — OCUVITE-LUTEIN PO CAPS
1.0000 | ORAL_CAPSULE | Freq: Every day | ORAL | Status: DC
  Filled 2021-07-14: qty 1

## 2021-07-14 MED ORDER — OSMOLITE 1.5 CAL PO LIQD
237.0000 mL | Freq: Every day | ORAL | Status: DC
Start: 2021-07-15 — End: 2021-07-14

## 2021-07-14 MED ORDER — LACTATED RINGERS IV BOLUS
1000.0000 mL | Freq: Once | INTRAVENOUS | Status: AC
  Administered 2021-07-14: 1000 mL via INTRAVENOUS

## 2021-07-14 MED ORDER — HEPARIN SODIUM (PORCINE) 5000 UNIT/ML IJ SOLN
5000.0000 [IU] | Freq: Three times a day (TID) | INTRAMUSCULAR | Status: DC
  Administered 2021-07-14 – 2021-07-18 (×12): 5000 [IU] via SUBCUTANEOUS
  Filled 2021-07-14 (×12): qty 1

## 2021-07-14 MED ORDER — SODIUM CHLORIDE 0.9 % IV SOLN
500.0000 mg | INTRAVENOUS | Status: DC
  Administered 2021-07-14 – 2021-07-16 (×3): 500 mg via INTRAVENOUS
  Filled 2021-07-14 (×3): qty 5

## 2021-07-14 MED ORDER — SODIUM CHLORIDE 0.9 % IV SOLN: INTRAVENOUS | Status: DC

## 2021-07-14 MED ORDER — FREE WATER: 60.0000 mL | Freq: Every day | Status: DC

## 2021-07-14 MED ORDER — DEXTROSE-NACL 5-0.9 % IV SOLN: INTRAVENOUS | Status: DC

## 2021-07-14 MED ORDER — SODIUM CHLORIDE 0.9 % IV SOLN
1.0000 g | INTRAVENOUS | Status: DC
  Administered 2021-07-15 – 2021-07-16 (×3): 1 g via INTRAVENOUS
  Filled 2021-07-14 (×3): qty 10

## 2021-07-14 MED ORDER — GABAPENTIN 250 MG/5ML PO SOLN
100.0000 mg | Freq: Two times a day (BID) | ORAL | Status: DC
  Administered 2021-07-14 – 2021-07-27 (×21): 100 mg
  Filled 2021-07-14 (×27): qty 2

## 2021-07-14 MED ORDER — LEVOTHYROXINE SODIUM 100 MCG PO TABS
100.0000 ug | ORAL_TABLET | Freq: Every day | ORAL | Status: DC
  Administered 2021-07-15 – 2021-07-23 (×9): 100 ug
  Filled 2021-07-14 (×9): qty 1

## 2021-07-14 MED ORDER — FREE WATER
30.0000 mL | Status: DC
  Administered 2021-07-14 – 2021-07-26 (×72): 30 mL

## 2021-07-14 MED ORDER — OSMOLITE 1.5 CAL PO LIQD: 120.0000 mL | Freq: Every day | ORAL | Status: DC

## 2021-07-14 MED ORDER — OSMOLITE 1.5 CAL PO LIQD
1000.0000 mL | ORAL | Status: DC
  Administered 2021-07-14 – 2021-07-25 (×13): 1000 mL
  Filled 2021-07-14 (×17): qty 1000

## 2021-07-14 MED ORDER — GABAPENTIN 100 MG PO CAPS: 100.0000 mg | ORAL_CAPSULE | Freq: Two times a day (BID) | ORAL | Status: DC

## 2021-07-14 MED ORDER — DEXTROSE 50 % IV SOLN
INTRAVENOUS | Status: AC
  Administered 2021-07-14: 50 mL
  Filled 2021-07-14: qty 50

## 2021-07-14 NOTE — Progress Notes (Addendum)
Initial Nutrition Assessment  DOCUMENTATION CODES:   Not applicable  INTERVENTION:   Osmolite 1.5 @60ml /hr- Initiate at 37ml/hr and increase by 60ml/hr q 8 hours until goal rate is reached.   Free water flushes 42ml q4 hours to maintain tube patency   Regimen provides 2160kcal/day, 90g/day protein and 1281ml/day of free water   Pt at high refeed risk; recommend monitor potassium, magnesium and phosphorus labs daily until stable  Ocuvite daily for wound healing (provides zinc, vitamin A, vitamin C, Vitamin E, copper, and selenium)  NUTRITION DIAGNOSIS:   Inadequate oral intake related to dysphagia as evidenced by NPO status (pt with chronic G-tube).  GOAL:   Patient will meet greater than or equal to 90% of their needs  MONITOR:   Labs, Weight trends, TF tolerance, Skin, I & O's  REASON FOR ASSESSMENT:   Consult Enteral/tube feeding initiation and management  ASSESSMENT:   76 y.o. 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  RD working remotely.  Unable to reach pt via phone after multiple attempts. Spoke with RN who reports pt alert and oriented today but is weak with some slurred speech. Spoke with pt's spouse via phone. Spouse reports she is frustrated with the whole " tube feed " situation. Spouse is agitated and unable to provide much information. Spouse is unsure what pt's home tube feed regimen is. Per TOC note from last admission pt receives tube feed supplies from [place] but RD spoke with [facility] who reports that they do not follow this pt. Spouse is unsure of the formula but she said that it was made by [facility]; she reports that she has cases of this at home. Per RN, pt reports that  he is on [employer]. Spouse reports that pt tells her how much formula to give and she  administers it twice daily at 0900 and 1700. Spouse is unsure how much pt gets but reports that it has been changed by the company many times r/t hyponatremia, constipation and failure to gain weight. Spouse reports diarrhea started after tube feeds were increased and pt was started on bowel regimen for constipation. Per chart, pt remains weight stable since having G-tube placed. Diarrhea improved today; plan is to restart tube feeds; will start slowly and advance to goal over 24 hrs. Pt is likely at refeed risk. Osmolite 1.5 in the cartons is currently unavailable; will start continuous feeds. Pt with hypoglycemia today and was started on IV dextrose. RD will obtain nutrition related history and exam at follow up.   Medications reviewed and include: aspirin, plavix, synthroid, azithromycin, ceftriaxone, NaCl w/ 5% dextrose @75ml /hr  Labs reviewed: Na 127(L) Wbc- 2.8(L), Hgb 9.7(L), Hct 27.3(L)  NUTRITION - FOCUSED PHYSICAL EXAM: Unable to perform at this time   Diet Order:   Diet Order             Diet NPO time specified  Diet effective now                  EDUCATION NEEDS:   No education needs have been identified at this time  Skin:  Skin Assessment: Reviewed RN Assessment (Stage II coccyx)  Last BM:  1/27- type 4  Height:   Ht Readings from Last 1 Encounters:  07/14/21 5\' 6"  (1.676 m)    Weight:   Wt Readings from Last 1 Encounters:  07/14/21 59.2 kg    Ideal Body Weight:  64.5 kg  BMI:  Body mass index is 21.06 kg/m.  Estimated Nutritional Needs:   Kcal:  1800-2100kcal/day  Protein:  90-105g/day  Fluid:  1.6-1.8L/day  Koleen Distance MS, RD, LDN Please refer to [facility] for RD and/or RD on-call/weekend/after hours pager

## 2021-07-14 NOTE — Consult Note (Signed)
Renal Service Consult Note [facility]  Alexander Duran 07/14/2021 Sol Blazing, MD Requesting Physician: Dr Karleen Hampshire  Reason for Consult: Hyponatremia HPI: The patient is a 76 y.o. year-old w/ hx of tonsil cancer, DJD, hx radiation therapy, HTN, OSA, wt loss that presented w/ uncontrolled diarrhea and generalized weakness x about 24 hrs at home. He was started on G-tube feeds recently earlier in Jan 2023, due to long-term radiation therapy effects on his esophagus (rx'd radiation/ surg in 2003 for tonsillar cancer). In ED serum Na was 125 (normal 138 from 06/22/21). Bp was soft, HR normal, RR normal, normal SpO2. WBC 4K. CXR showed retrocardiac infiltrate w/o edema. Pt rec'd IVF"s and Na improved to 127 today. Asked to see for hyponatremia.    Pt was given 1 L bolus on 1/26 day of admission, then had LR at 75 cc/hr, another 1.5 L bolus yesterday and 2 L LR bolus today. Then today LR was dc'd and D5NS was started at 75 cc/hr then ^'d to 100 cc/hr. Total I/O's are 3.8 L in and 350 cc recorded UOP (out) = net +3.8 L since admission.    BP's have been running soft in the 90s - 100s, w/ some BP's in the 140's as well. On 2L [place]. Repeat Na at 12 pm today was 129.   ROS - denies CP, no joint pain, no HA, no blurry vision, no rash, no diarrhea, no nausea/ vomiting, no dysuria, no difficulty voiding   Past Medical History  Past Medical History:  Diagnosis Date   Arthritis    Cancer ([place])    Tonsil cancer   Carotid artery stenosis without cerebral infarction 08/30/2015   Difficulty in swallowing    History of radiation therapy    Hypertension    Hypothyroidism 05/09/2014   Other headache syndrome 08/10/2015   Sleep apnea    Weight loss 08/08/2015   Past Surgical History  Past Surgical History:  Procedure Laterality Date   BACK SURGERY     CAROTID PTA/STENT INTERVENTION Left 08/02/2016   Procedure: Carotid PTA/Stent Intervention;  Surgeon: Elam Dutch, MD;  Location: [place] [facility];  Service: Cardiovascular;  Laterality: Left;   ESOPHAGOGASTRODUODENOSCOPY N/A 11/15/2016   Procedure: ESOPHAGOGASTRODUODENOSCOPY (EGD);  Surgeon: Doran Stabler, MD;  Location: [facility];  Service: Endoscopy;  Laterality: N/A;   FOREIGN BODY REMOVAL N/A 11/15/2016   Procedure: FOREIGN BODY REMOVAL;  Surgeon: Doran Stabler, MD;  Location: [place];  Service: Endoscopy;  Laterality: N/A;   LAPAROSCOPIC GASTROSTOMY N/A 06/21/2021   Procedure: LAPAROSCOPIC GASTROSTOMY TUBE PLACEMENT;  Surgeon: Clovis Riley, MD;  Location: [place];  Service: General;  Laterality: N/A;   PERIPHERAL VASCULAR CATHETERIZATION N/A 07/04/2016   Procedure: Aortic Arch Angiography;  Surgeon: Conrad [place], MD;  Location: [place] [facility];  Service: Cardiovascular;  Laterality: N/A;   PERIPHERAL VASCULAR CATHETERIZATION N/A 07/04/2016   Procedure: Carotid & cerebral  Angiography;  Surgeon: Conrad Corning, MD;  Location: [place] [facility];  Service: Cardiovascular;  Laterality: N/A;   TOTAL HIP ARTHROPLASTY Left 10/26/2019   Procedure: TOTAL HIP ARTHROPLASTY ANTERIOR APPROACH;  Surgeon: Paralee Cancel, MD;  Location: [facility];  Service: Orthopedics;  Laterality: Left;  70 mins   TUMOR REMOVAL     in neck   Family History  Family History  Problem Relation Age of Onset   Cancer Father        lung cancer   COPD Mother  Macular degeneration Mother    Colon cancer Neg Hx    Stomach cancer Neg Hx    Pancreatic cancer Neg Hx    Social History  reports that he quit smoking about 4 years ago. His smoking use included cigarettes. He has a 35.00 pack-year smoking history. He has never used smokeless tobacco. He reports that he does not currently use alcohol after a past usage of about 2.0 standard drinks per week. He reports that he does not currently use drugs after having used the following drugs: Marijuana. Allergies  Allergies  Allergen Reactions   Iodinated Contrast Media     Other reaction(s):  Weakness present   Home medications Prior to Admission medications   Medication Sig Start Date End Date Taking? Authorizing Provider  albuterol (VENTOLIN HFA) 108 (90 Base) MCG/ACT inhaler Inhale 2 puffs into the lungs every 6 (six) hours as needed for wheezing or shortness of breath.   Yes [provider]  aspirin 81 MG chewable tablet Chew 81 mg by mouth every evening.    Yes [provider]  atorvastatin (LIPITOR) 20 MG tablet Take 20 mg by mouth daily.   Yes [provider]  levothyroxine (SYNTHROID, LEVOTHROID) 75 MCG tablet Take 75 mcg by mouth daily before breakfast.    Yes [provider]  midodrine (PROAMATINE) 5 MG tablet Place 1 tablet (5 mg total) into feeding tube 3 (three) times daily with meals. 06/27/21  Yes Ghimire, Henreitta Leber, MD  Nutritional Supplements (FEEDING SUPPLEMENT, OSMOLITE 1.5 CAL,) LIQD Place 355 mLs into feeding tube 4 (four) times daily. 06/27/21 09/25/21 Yes Ghimire, Henreitta Leber, MD  OVER THE COUNTER MEDICATION TheraTears Liquid Eye Drops- Place 1-2 drops into both eyes one to three times as day as needed for dryness or irritation   Yes [provider]  acetaminophen (TYLENOL) 160 MG/5ML suspension Place 20.3 mLs (650 mg total) into feeding tube every 8 (eight) hours as needed for mild pain. Patient not taking: Reported on 07/14/2021 06/27/21   Jonetta Osgood, MD  fluticasone [facility]) 50 MCG/ACT nasal spray Place 2 sprays into both nostrils daily. Patient not taking: Reported on 07/14/2021 06/28/21   Jonetta Osgood, MD  loratadine (CLARITIN) 10 MG tablet Place 1 tablet (10 mg total) into feeding tube daily. Patient not taking: Reported on 07/14/2021 06/28/21   Jonetta Osgood, MD  pantoprazole sodium (PROTONIX) 40 mg Place 40 mg into feeding tube daily. Patient not taking: Reported on 07/14/2021 06/27/21 09/25/21  Jonetta Osgood, MD     Vitals:   07/14/21 8469 07/14/21 0826 07/14/21 1000 07/14/21 1135  BP: (!)  87/61 (!) 90/55  (!) 142/77  Pulse: 64 (!) 57  99  Resp: 20 15  18   Temp: (!) 97.4 F (36.3 C)   98.2 F (36.8 C)  TempSrc: Oral   Oral  SpO2: 97% 100%  100%  Weight:   59.2 kg   Height:   5\' 6"  (1.676 m)    Exam Gen alert, no distress No rash, cyanosis or gangrene Sclera anicteric, throat clear  No jvd or bruits, postop R neck change chronic Chest clear bilat to bases, no rales/ wheezing RRR no MRG Abd soft ntnd no mass or ascites +bs GU normal male MS no joint effusions or deformity Ext no LE or UE edema, no wounds or ulcers Neuro is alert, Ox 3 , nf         Home meds include - asa 81, lipitor 20, synthroid, midodrine 5  tid per tube, osmolite prn, protonix 40 qd, prns/ vits/ supps      UA 07/13/21 - negative     Serum osm 256, UNa 33,  UOsm 533 ^     Na 125- 129,  BUN 15  cr 0.63  CO2 21  CA 8.5  Alb 3.0  uric acid 2.7  LFT's ok    WBC 2.8K  Hb 9.7  plt wnl     CXR = COPD/ flattened diaphragms, no edema of consolidation     Current meds = asa, lipitor, plavix, 30 ml q 4hrs free water, nurontin 100 bid, hep SQ tid, midodrine 5 mg ac tid, ocuvite-lutein, IV azithro/ rocephin, osmolite TF's, free water 30 ml q 4h, neurontin 100 bid, synthroid, MSO4 prn.           Assessment/ Plan: Hyponatremic hyponatremia - pt recently started on G-tube feeds due to long-term damage of radiation therapy on his swallowing/ esophageal function. Started in early January 2023. He was constipated and took a laxative then had uncontrolled diarrhea for 24hrs.  Unable to stop he came to ED.  Na was 125. He rec'd about 4 L of bolus isotonic fluids and also has rec'd IVF"s also isotonic.  UNa 33 which is borderline and was done early am on 1/27 after receiving 1 L NS bolus. UOsm is high. Suspect this is hypovolemic hyponatremia. Na+ up this afternoon to 129.  Will resume 0.9% NS at 100 cc/hr. Get f/u labs tomorrow am.  Will follow.  Nutrition - getting TF"s via G-tube H/o tonsillar cancer - rx'd 2003  w/ surgery and radiation      Kelly Splinter  MD 07/14/2021, 6:09 PM  Recent Labs  Lab 07/13/21 0338 07/14/21 0609  WBC 4.1 2.8*  HGB 9.4* 9.7*   Recent Labs  Lab 07/12/21 2126 07/13/21 0338 07/13/21 1723 07/14/21 0609  K 4.8 4.5 4.3 4.2  BUN 20 16 13 15   CREATININE 0.62 0.59* 0.55* 0.63  ALBUMIN 3.3* 2.8*  --   --   CALCIUM 9.3 8.5* 8.5* 8.1*  PHOS  --  3.0  --   --

## 2021-07-14 NOTE — Progress Notes (Signed)
Patient with critical glucose 43 this am on lab work, given 1 ampule Dextrose 50%

## 2021-07-14 NOTE — Progress Notes (Signed)
PROGRESS NOTE    [name]  LNL:[id number] DOB: 11/20/1945 DOA: 07/12/2021 PCP: Windy Fast, MD    Chief Complaint  Patient presents with   Diarrhea    Brief Narrative:  Alexander Duran is a 76 y.o. 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  Of note, patient was recently hospitalized starting 06/17/2021 for COVID-19 infection, with associated positive COVID-19 test on the day of admission. Earlier this am pt had trouble with left arm numbness and a code stroke was called. MRI of the brain was negative for acute stroke, pt has chronic right ICA stenosis.  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   Assessment & Plan:   Principal Problem:   Dehydration Active Problems:   Hypothyroidism   Diarrhea   Generalized weakness   Acute hyponatremia   HLD (hyperlipidemia)   Pressure injury of skin   Dehydration probably from diarrhea . Pt reports that he was constipated and he was asked to increased the rate of the tube feeds and take stool softeners and laxatives. Since then he had been having multiple episodes of diarrhea.  Only 2 BM since morning, still loose, with some abdominal discomfort.  Continue with gentle hydration and repeat renal parameters in a.m. C. difficile PCR ordered and pending Since diarrhea has resolved we will slowly restart his tube feeds.   Generalized weakness with some tingling and numbness in the fingers MRI of the brain is negative for acute intracranial abnormalities or acute stroke. 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 Neurology consulted recommended adding Plavix to the aspirin and  statin. Will start the patient on gabapentin low dose to see if the tingling and numbness improves.     Hypothermia, hypotension, SIRS  Hypothermia improved.  BP parameters improved with fluid boluses.  CXR this morning showing left lower lobe consolidations. Will do a trial of antibiotics and watch him.  COVID-19 PCR is negative.  Urinalysis is negative for infection.    Hyponatremia Probably secondary to hypovolemia from dehydration in the setting of GI losses in the form of diarrhea. Gently hydrate and repeat sodium every 8 hours.sodium has improved to 129.  Differential include SIADH in the context of recent COVID-19 infection. Tsh elevated. Will get am cortisol levels.  Continue to monitor.   Hypothyroidism TSH high, low free t3, free t4 level normal.  Increase synthroid to 100 mcg daily.    Hyperlipidemia Continue with statin.    Pressure injury Pressure Injury 07/13/21 Coccyx Stage 2 -  Partial thickness loss of dermis presenting as a shallow open injury with a red, pink wound bed without slough. (Active)  07/13/21 0000  Location: Coccyx  Location Orientation:   Staging: Stage 2 -  Partial thickness loss of dermis presenting as a shallow open injury with a red, pink wound bed without slough.  Wound Description (Comments):   Present on Admission: Yes   Wound care will be consulted.    Nutrition:  Dietary consulted for initiation of tube feeds.    DVT prophylaxis: Heparin.  Code Status: DNR  Family Communication: none at bedside.  Disposition:   Status is: Inpatient  Remains inpatient appropriate because: dehydration, IV fluids, IV antibiotics.  Consultants:  Neurology.   Procedures: CTA head and neck.  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 Severe left neural foraminal stenosis at C3-4 and moderate bilateral neural foraminal  stenosis at C4-5.  MRI brain:  Chronic right ICA occlusion.   Antimicrobials:None.   Subjective: Intermittent numbness of the hands persistent even with improvement in sodium and dehydration.   Objective: Vitals:   07/14/21 0814 07/14/21 0826 07/14/21 1000 07/14/21 1135  BP: (!) 87/61 (!) 90/55  (!) 142/77  Pulse: 64 (!) 57  99  Resp: 20 15  18   Temp: (!) 97.4 F (36.3 C)   98.2 F (36.8 C)  TempSrc: Oral   Oral  SpO2: 97% 100%  100%  Weight:   59.2 kg   Height:   5\' 6"  (1.676 m)     Intake/Output Summary (Last 24 hours) at 07/14/2021 1607 Last data filed at 07/14/2021 0600 Gross per 24 hour  Intake 1580 ml  Output 350 ml  Net 1230 ml   Filed Weights   07/14/21 1000  Weight: 59.2 kg    Examination:  General exam: Appears calm and comfortable  Respiratory system: Clear to auscultation. Respiratory effort normal. Cardiovascular system: S1 & S2 heard, RRR. No JVD, No pedal edema. Gastrointestinal system: Abdomen is nondistended, soft and nontender. S/p PEG tube. Normal bowel sounds heard. Central nervous system: Alert and oriented. No focal neurological deficits. Extremities: Symmetric 5 x 5 power. Skin: No rashes, lesions or ulcers Psychiatry: Mood & affect appropriate.      Data Reviewed: I have personally reviewed following labs and imaging studies  CBC: Recent Labs  Lab 07/12/21 2126 07/13/21 0338 07/14/21 0609  WBC 4.6 4.1 2.8*  NEUTROABS 3.7 2.9  --   HGB 10.9* 9.4* 9.7*  HCT 31.9* 26.9* 27.3*  MCV 90.6 90.6 90.4  PLT 356 323 449    Basic Metabolic Panel: Recent Labs  Lab 07/12/21 2126 07/13/21 0338 07/13/21 1723 07/13/21 1829 07/14/21 0609 07/14/21 1232  NA 125* 125* 124* 125* 127* 129*  K 4.8 4.5 4.3  --  4.2  --   CL 87* 93* 92*  --  96*  --   CO2 29 25 24   --  21*  --   GLUCOSE 95 55* 61*  --  43*  --   BUN 20 16 13   --  15  --   CREATININE 0.62 0.59* 0.55*  --  0.63  --   CALCIUM 9.3 8.5* 8.5*  --  8.1*  --   MG 2.1 1.8  --    --   --   --   PHOS  --  3.0  --   --   --   --     GFR: Estimated Creatinine Clearance: 68.9 mL/min (by C-G formula based on SCr of 0.63 mg/dL).  Liver Function Tests: Recent Labs  Lab 07/12/21 2126 07/13/21 0338  AST 42* 34  ALT 32 27  ALKPHOS 74 59  BILITOT 0.6 0.4  PROT 6.5 5.3*  ALBUMIN 3.3* 2.8*    CBG: Recent Labs  Lab 07/14/21 0713  GLUCAP 140*     Recent Results (from the past 240 hour(s))  Blood culture (routine x 2)     Status: None (Preliminary result)   Collection Time: 07/12/21  9:35 PM   Specimen: BLOOD  Result Value Ref Range Status   Specimen Description BLOOD SITE NOT SPECIFIED  Final   Special Requests   Final    BOTTLES DRAWN AEROBIC AND ANAEROBIC Blood Culture adequate volume   Culture   Final    NO GROWTH 2 DAYS Performed at [facility], [address], [place], [place] [address]    Report Status PENDING  Incomplete  Resp Panel by RT-PCR (Flu A&B, Covid) Nasopharyngeal Swab     Status: None   Collection Time: 07/12/21  9:37 PM   Specimen: Nasopharyngeal Swab; Nasopharyngeal(NP) swabs in vial transport medium  Result Value Ref Range Status   SARS Coronavirus 2 by RT PCR NEGATIVE NEGATIVE Final    Comment: (NOTE) SARS-CoV-2 target nucleic acids are NOT DETECTED.  The SARS-CoV-2 RNA is generally detectable in upper respiratory specimens during the acute phase of infection. The lowest concentration of SARS-CoV-2 viral copies this assay can detect is 138 copies/mL. A negative result does not preclude SARS-Cov-2 infection and should not be used as the sole basis for treatment or other patient management decisions. A negative result may occur with  improper specimen collection/handling, submission of specimen other than nasopharyngeal swab, presence of viral mutation(s) within the areas targeted by this assay, and inadequate number of viral copies(<138 copies/mL). A negative result must be combined with clinical observations, patient  history, and epidemiological information. The expected result is Negative.  Fact Sheet for Patients:  EntrepreneurPulse.com.au  Fact Sheet for Healthcare Providers:  IncredibleEmployment.be  This test is no t yet approved or cleared by the [place] FDA and  has been authorized for detection and/or diagnosis of SARS-CoV-2 by FDA under an Emergency Use Authorization (EUA). This EUA will remain  in effect (meaning this test can be used) for the duration of the COVID-19 declaration under Section 564(b)(1) of the Act, 21 U.S.C.section 360bbb-3(b)(1), unless the authorization is terminated  or revoked sooner.       Influenza A by PCR NEGATIVE NEGATIVE Final   Influenza B by PCR NEGATIVE NEGATIVE Final    Comment: (NOTE) The Xpert Xpress SARS-CoV-2/FLU/RSV plus assay is intended as an aid in the diagnosis of influenza from Nasopharyngeal swab specimens and should not be used as a sole basis for treatment. Nasal washings and aspirates are unacceptable for Xpert Xpress SARS-CoV-2/FLU/RSV testing.  Fact Sheet for Patients: EntrepreneurPulse.com.au  Fact Sheet for Healthcare Providers: IncredibleEmployment.be  This test is not yet approved or cleared by the [place] FDA and has been authorized for detection and/or diagnosis of SARS-CoV-2 by FDA under an Emergency Use Authorization (EUA). This EUA will remain in effect (meaning this test can be used) for the duration of the COVID-19 declaration under Section 564(b)(1) of the Act, 21 U.S.C. section 360bbb-3(b)(1), unless the authorization is terminated or revoked.  Performed at [facility], [place] [address] [place], [place] [address]   Blood culture (routine x 2)     Status: None (Preliminary result)   Collection Time: 07/12/21  9:40 PM   Specimen: BLOOD RIGHT FOREARM  Result Value Ref Range Status   Specimen Description BLOOD RIGHT FOREARM   Final   Special Requests   Final    BOTTLES DRAWN AEROBIC AND ANAEROBIC Blood Culture results may not be optimal due to an inadequate volume of blood received in culture bottles   Culture   Final    NO GROWTH 2 DAYS Performed at [facility], [place] [address], [place], [place] [address]    Report Status PENDING  Incomplete         Radiology  Studies: CT HEAD WO CONTRAST (5MM)  Result Date: 07/13/2021 CLINICAL DATA:  Acute stroke suspected EXAM: CT HEAD WITHOUT CONTRAST TECHNIQUE: Contiguous axial images were obtained from the base of the skull through the vertex without intravenous contrast. RADIATION DOSE REDUCTION: This exam was performed according to the departmental dose-optimization program which includes automated exposure control, adjustment of the mA and/or kV according to patient size and/or use of iterative reconstruction technique. COMPARISON:  None. FINDINGS: Brain: No evidence of acute infarction, hemorrhage, hydrocephalus, extra-axial collection or mass lesion/mass effect. Vascular: No hyperdense vessel or unexpected calcification. Skull: Normal. Negative for fracture or focal lesion. Sinuses/Orbits: No acute finding. Other: These results were communicated to Dr. Leonel Ramsay at 4:31 am on 07/13/2021 by text page via the [facility] messaging system. IMPRESSION: Negative head CT. Electronically Signed   By: Jorje Guild M.D.   On: 07/13/2021 04:31   MR BRAIN WO CONTRAST  Result Date: 07/13/2021 CLINICAL DATA:  Neuro deficit, acute, stroke suspected. Left upper and lower extremity numbness. History of tonsillar cancer. EXAM: MRI HEAD WITHOUT CONTRAST TECHNIQUE: Multiplanar, multiecho pulse sequences of the brain and surrounding structures were obtained without intravenous contrast. COMPARISON:  Head CT and CTA 07/13/2021 FINDINGS: Brain: There is no evidence of an acute infarct, mass, midline shift, or extra-axial fluid collection. Small T2 hyperintensities in the cerebral white  matter bilaterally are nonspecific but compatible with mild chronic small vessel ischemic disease. There may be 1 or 2 chronic microhemorrhages in the left frontal lobe. The ventricles and sulci are within normal limits for age. Vascular: Abnormal appearance of the distal right internal carotid artery corresponding to known occlusion. Skull and upper cervical spine: Unremarkable bone marrow signal. Sinuses/Orbits: Mild bilateral ethmoid air cell mucosal thickening. Small bilateral mastoid effusions. Other: None. IMPRESSION: 1. No acute intracranial abnormality. 2. Mild chronic small vessel ischemic disease. 3. Chronic right ICA occlusion. Electronically Signed   By: Logan Bores M.D.   On: 07/13/2021 08:56   MR CERVICAL SPINE WO CONTRAST  Result Date: 07/13/2021 CLINICAL DATA:  Myelopathy, acute, cervical spine. Left upper and lower extremity numbness. History of tonsillar cancer. EXAM: MRI CERVICAL SPINE WITHOUT CONTRAST TECHNIQUE: Multiplanar, multisequence MR imaging of the cervical spine was performed. No intravenous contrast was administered. COMPARISON:  Cervical spine CT 07/13/2021 FINDINGS: Alignment: Straightening of the normal cervical lordosis. Trace anterolisthesis of C2 on C3. Vertebrae: No acute fracture. Remote mild T2 superior endplate compression fracture. Prominent fatty marrow changes throughout the spine. Cord: 1.5 mm focus of T2 hypointensity in the right dorsal spinal cord at C2-3 with prominent susceptibility artifact suggesting chronic blood products as there is no calcification visible on CT and no cord edema. Normal cord signal and morphology elsewhere. Posterior Fossa, vertebral arteries, paraspinal tissues: Posterior fossa more fully evaluated on the separate head MRI. Preserved vertebral artery flow voids. Disc levels: C2-3: Anterolisthesis with disc uncovering, uncovertebral spurring, and facet hypertrophy result in mild left neural foraminal stenosis without spinal stenosis.  Bilateral facet ankylosis and partial interbody ankylosis. C3-4: Asymmetric left uncovertebral spurring and asymmetric left facet arthrosis result in severe left neural foraminal stenosis without spinal stenosis. Left facet ankylosis and partial interbody ankylosis. C4-5: Uncovertebral spurring and mild-to-moderate facet arthrosis result in moderate bilateral neural foraminal stenosis without spinal stenosis. C5-6: Bridging anterior vertebral osteophytes. Minimal uncovertebral spurring and mild facet arthrosis without significant stenosis. C6-7: Bridging anterior vertebral osteophytes.  No stenosis. C7-T1: Mild facet arthrosis without stenosis. IMPRESSION: 1. Punctate focus of likely chronic hemorrhage in the spinal cord  at C2-3. 2. Cervical spondylosis and facet arthrosis without spinal stenosis. 3. Severe left neural foraminal stenosis at C3-4 and moderate bilateral neural foraminal stenosis at C4-5. Electronically Signed   By: Logan Bores M.D.   On: 07/13/2021 09:33   CT C-SPINE NO CHARGE  Result Date: 07/13/2021 CLINICAL DATA:  Acute stroke suspected EXAM: CT Cervical Spine with contrast TECHNIQUE: Multiplanar CT images of the cervical spine were reconstructed from contemporary CTA of the Neck. RADIATION DOSE REDUCTION: This exam was performed according to the departmental dose-optimization program which includes automated exposure control, adjustment of the mA and/or kV according to patient size and/or use of iterative reconstruction technique. CONTRAST:  None additional COMPARISON:  None similar FINDINGS: Alignment: No traumatic malalignment. Skull base and vertebrae: No acute fracture or focal bone lesion. Generalized osteopenia. Remote T2 superior endplate fracture. Soft tissues and spinal canal: Post treatment neck. No perispinal or visible intra canal hemorrhage/collection. Disc levels: Degenerative ankylosis at C2-C4. Degenerative ankylosis at C5-C7. Generalized facet spurring. Up to moderate  foraminal narrowing at C4-5 bilaterally. Upper chest: Reported separately IMPRESSION: No evidence of acute injury to the cervical spine. Spinal degeneration and multilevel ankylosis. Electronically Signed   By: Jorje Guild M.D.   On: 07/13/2021 05:07   DG CHEST PORT 1 VIEW  Result Date: 07/14/2021 CLINICAL DATA:  Fever Evaluate atelectasis or infiltrate. pleural effusion. pneumothorax. EXAM: PORTABLE CHEST 1 VIEW COMPARISON:  07/12/2021 and older studies. FINDINGS: Left retrocardiac lung base opacity is similar to the most recent prior exam. Remainder of the lungs is clear. Lungs are hyperexpanded. Possible small left effusion.  No evidence of a pneumothorax. Cardiac silhouette normal in size.  No mediastinal or hilar masses. IMPRESSION: 1. No significant change from the most recent prior study. 2. Left lower lobe, retrocardiac, opacity is consistent with pneumonia, atelectasis or a combination. Electronically Signed   By: Lajean Manes M.D.   On: 07/14/2021 09:29   DG Chest Portable 1 View  Result Date: 07/12/2021 CLINICAL DATA:  Concern for pneumonia. EXAM: PORTABLE CHEST 1 VIEW COMPARISON:  Chest radiograph dated 06/17/2021. FINDINGS: Left retrocardiac consolidation with air bronchograms may represent atelectasis or infiltrate. Clinical correlation recommended. No large pleural effusion. No pneumothorax. Mild cardiomegaly. No acute osseous pathology. IMPRESSION: Left retrocardiac consolidation may represent atelectasis or infiltrate. Electronically Signed   By: Anner Crete M.D.   On: 07/12/2021 22:11   CT ANGIO HEAD NECK W WO CM (CODE STROKE)  Result Date: 07/13/2021 CLINICAL DATA:  Acute stroke suspected EXAM: CT ANGIOGRAPHY HEAD AND NECK TECHNIQUE: Multidetector CT imaging of the head and neck was performed using the standard protocol during bolus administration of intravenous contrast. Multiplanar CT image reconstructions and MIPs were obtained to evaluate the vascular anatomy. Carotid  stenosis measurements (when applicable) are obtained utilizing NASCET criteria, using the distal internal carotid diameter as the denominator. RADIATION DOSE REDUCTION: This exam was performed according to the departmental dose-optimization program which includes automated exposure control, adjustment of the mA and/or kV according to patient size and/or use of iterative reconstruction technique. CONTRAST:  Dose is not known on this in progress study COMPARISON:  CTA of the neck 06/19/2016 FINDINGS: CTA NECK FINDINGS Aortic arch: Partial coverage shows diffuse atheromatous plaque with 3 vessel branching. Right carotid system: Bulky plaque at the bifurcation with chronic proximal ICA occlusion continuing to the intracranial segments. No interval change Left carotid system: Diffuse atheromatous wall thickening and intermittent calcification. Interval stenting of the proximal left ICA with no evidence of in  stent stenosis or new narrowing. Vertebral arteries: Proximal subclavian atherosclerosis without flow reducing stenosis. Bilateral vertebral origin plaque measuring at least 60% on coronal reformats. Mild downstream plaque without significant V2 or V3 segment stenosis. No dissection. Skeleton: Cervical spine degeneration with multilevel ankylosis. 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 Neck dissection on the right at least Upper chest: Biapical radiation fibrosis.  No acute finding. Review of the MIP images confirms the above findings CTA HEAD FINDINGS Anterior circulation: Reconstituted right ICA at the paraclinoid segment where the ophthalmic arteries enhancing and where there is a large right posterior communicating artery. Anterior communicating artery is also present. No branch occlusion, beading, or aneurysm. Posterior circulation: Right dominant vertebral artery. The vertebral and basilar arteries are smoothly contoured and widely patent. No branch  occlusion, beading, or aneurysm. Venous sinuses: Diffusely patent Anatomic variants: None significant Review of the MIP images confirms the above findings IMPRESSION: 1. No emergent finding. 2. 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 Intact circle-of-Willis. 3. Left proximal carotid stenting since prior. No in stent stenosis. 4. 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 Electronically Signed   By: Jorje Guild M.D.   On: 07/13/2021 04:41        Scheduled Meds:  aspirin  81 mg Per Tube QPM   atorvastatin  20 mg Per Tube Daily   clopidogrel  75 mg Per Tube Daily   free water  30 mL Per Tube Q4H   heparin injection (subcutaneous)  5,000 Units Subcutaneous Q8H   [START ON 07/15/2021] levothyroxine  100 mcg Per Tube QAC breakfast   loratadine  10 mg Per Tube Daily   midodrine  5 mg Per Tube TID WC   [START ON 07/15/2021] multivitamin-lutein  1 capsule Per Tube Daily   Continuous Infusions:  azithromycin 500 mg (07/14/21 1323)   [START ON 07/15/2021] cefTRIAXone (ROCEPHIN)  IV     dextrose 5 % and 0.9% NaCl 75 mL/hr at 07/14/21 0938   feeding supplement (OSMOLITE 1.5 CAL) 1,000 mL (07/14/21 1335)     LOS: 1 day        Hosie Poisson, MD [facility]   To contact the attending provider between 7A-7P or the covering provider during after hours 7P-7A, please log into the web site [email or web address] and access using universal [place] password for that web site. If you do not have the password, please call the hospital operator.  07/14/2021, 4:07 PM

## 2021-07-15 DIAGNOSIS — E871 Hypo-osmolality and hyponatremia: Secondary | ICD-10-CM | POA: Diagnosis not present

## 2021-07-15 DIAGNOSIS — Z515 Encounter for palliative care: Secondary | ICD-10-CM

## 2021-07-15 DIAGNOSIS — E46 Unspecified protein-calorie malnutrition: Secondary | ICD-10-CM

## 2021-07-15 DIAGNOSIS — E860 Dehydration: Secondary | ICD-10-CM | POA: Diagnosis not present

## 2021-07-15 DIAGNOSIS — R627 Adult failure to thrive: Secondary | ICD-10-CM

## 2021-07-15 DIAGNOSIS — Z7189 Other specified counseling: Secondary | ICD-10-CM | POA: Diagnosis not present

## 2021-07-15 DIAGNOSIS — R531 Weakness: Secondary | ICD-10-CM | POA: Diagnosis not present

## 2021-07-15 DIAGNOSIS — R197 Diarrhea, unspecified: Secondary | ICD-10-CM | POA: Diagnosis not present

## 2021-07-15 LAB — CBC
HCT: 32 % — ABNORMAL LOW (ref 39.0–52.0)
Hemoglobin: 10.9 g/dL — ABNORMAL LOW (ref 13.0–17.0)
MCH: 30.8 pg (ref 26.0–34.0)
MCHC: 34.1 g/dL (ref 30.0–36.0)
MCV: 90.4 fL (ref 80.0–100.0)
Platelets: 371 10*3/uL (ref 150–400)
RBC: 3.54 MIL/uL — ABNORMAL LOW (ref 4.22–5.81)
RDW: 15.7 % — ABNORMAL HIGH (ref 11.5–15.5)
WBC: 4.2 10*3/uL (ref 4.0–10.5)
nRBC: 0 % (ref 0.0–0.2)

## 2021-07-15 LAB — GLUCOSE, CAPILLARY
Glucose-Capillary: 109 mg/dL — ABNORMAL HIGH (ref 70–99)
Glucose-Capillary: 112 mg/dL — ABNORMAL HIGH (ref 70–99)
Glucose-Capillary: 113 mg/dL — ABNORMAL HIGH (ref 70–99)
Glucose-Capillary: 85 mg/dL (ref 70–99)
Glucose-Capillary: 88 mg/dL (ref 70–99)
Glucose-Capillary: 89 mg/dL (ref 70–99)

## 2021-07-15 LAB — MAGNESIUM: Magnesium: 1.7 mg/dL (ref 1.7–2.4)

## 2021-07-15 LAB — BASIC METABOLIC PANEL
Anion gap: 5 (ref 5–15)
BUN: 9 mg/dL (ref 8–23)
CO2: 27 mmol/L (ref 22–32)
Calcium: 8 mg/dL — ABNORMAL LOW (ref 8.9–10.3)
Chloride: 95 mmol/L — ABNORMAL LOW (ref 98–111)
Creatinine, Ser: 0.49 mg/dL — ABNORMAL LOW (ref 0.61–1.24)
GFR, Estimated: >60 mL/min (ref >60)
Glucose, Bld: 86 mg/dL (ref 70–99)
Potassium: 3.8 mmol/L (ref 3.5–5.1)
Sodium: 127 mmol/L — ABNORMAL LOW (ref 135–145)

## 2021-07-15 LAB — SODIUM
Sodium: 128 mmol/L — ABNORMAL LOW (ref 135–145)
Sodium: 124 mmol/L — ABNORMAL LOW (ref 135–145)

## 2021-07-15 LAB — PHOSPHORUS: Phosphorus: 3.1 mg/dL (ref 2.5–4.6)

## 2021-07-15 MED ORDER — UREA 15 G PO PACK
15.0000 g | PACK | Freq: Two times a day (BID) | ORAL | Status: DC
  Administered 2021-07-15 (×2): 15 g
  Filled 2021-07-15 (×3): qty 1

## 2021-07-15 MED ORDER — FUROSEMIDE 20 MG PO TABS
10.0000 mg | ORAL_TABLET | Freq: Two times a day (BID) | ORAL | Status: DC
  Administered 2021-07-15 – 2021-07-18 (×7): 10 mg
  Filled 2021-07-15 (×7): qty 1

## 2021-07-15 MED ORDER — ADULT MULTIVITAMIN W/MINERALS CH
1.0000 | ORAL_TABLET | Freq: Every day | ORAL | Status: DC
  Administered 2021-07-15 – 2021-07-27 (×12): 1
  Filled 2021-07-15 (×13): qty 1

## 2021-07-15 MED ORDER — PROSIGHT PO TABS: 1.0000 | ORAL_TABLET | Freq: Every day | ORAL | Status: DC

## 2021-07-15 MED ORDER — ALPRAZOLAM 0.25 MG PO TABS
0.2500 mg | ORAL_TABLET | Freq: Two times a day (BID) | ORAL | Status: DC | PRN
  Administered 2021-07-15 – 2021-07-16 (×3): 0.25 mg
  Filled 2021-07-15 (×3): qty 1

## 2021-07-15 NOTE — TOC Initial Note (Signed)
Transition of Care [facility]) - Initial/Assessment Note    Patient Details  Name: Alexander Duran MRN: [id number] Date of Birth: 1946/04/10  Transition of Care [facility]) CM/SW Contact:    Milas Gain, [place] Phone Number: 07/15/2021, 9:50 AM  Clinical Narrative:                  CSW received consult for possible SNF placement at time of discharge. CSW spoke with patient regarding PT recommendation of SNF placement at time of discharge. Patient reports he comes from home with spouse. Patient expressed understanding of PT recommendation and declined SNF placement at time of discharge. Patient request to go home with palliative services when medically ready for dc. CSW informed MD of patients request. CSW informed patient that MD was informed of his request. Patient thanked CSW.No further questions reported at this time. CSW to continue to follow and assist with discharge planning needs.  Expected Discharge Plan: Home/Self Care (home self care tbd if agreeable to hh services) Barriers to Discharge: Continued Medical Work up   Patient Goals and CMS Choice Patient states their goals for this hospitalization and ongoing recovery are:: to go home CMS Medicare.gov Compare Post Acute Care list provided to:: Patient Choice offered to / list presented to : Patient  Expected Discharge Plan and Services Expected Discharge Plan: Home/Self Care (home self care tbd if agreeable to hh services) In-house Referral: Clinical Social Work     Living arrangements for the past 2 months: Single Family Home                                      Prior Living Arrangements/Services Living arrangements for the past 2 months: Single Family Home Lives with:: Self, Spouse Patient language and need for interpreter reviewed:: Yes Do you feel safe going back to the place where you live?: Yes (Patient wants to go back home with palliative services)      Need for Family Participation in Patient Care: Yes  (Comment) Care giver support system in place?: Yes (comment)   Criminal Activity/Legal Involvement Pertinent to Current Situation/Hospitalization: No - Comment as needed  Activities of Daily Living Home Assistive Devices/Equipment: Cane (specify quad or straight), Walker (specify type), Bedside commode/3-in-1 ADL Screening (condition at time of admission) Patient's cognitive ability adequate to safely complete daily activities?: Yes Is the patient deaf or have difficulty hearing?: Yes Does the patient have difficulty seeing, even when wearing glasses/contacts?: No Does the patient have difficulty concentrating, remembering, or making decisions?: No Patient able to express need for assistance with ADLs?: Yes Does the patient have difficulty dressing or bathing?: Yes Independently performs ADLs?: No Communication: Independent Dressing (OT): Needs assistance Is this a change from baseline?: Pre-admission baseline Grooming: Needs assistance Is this a change from baseline?: Pre-admission baseline Feeding: Independent Bathing: Needs assistance Is this a change from baseline?: Pre-admission baseline Toileting: Needs assistance Is this a change from baseline?: Pre-admission baseline In/Out Bed: Needs assistance Is this a change from baseline?: Pre-admission baseline Walks in Home: Independent with device (comment) Does the patient have difficulty walking or climbing stairs?: Yes Weakness of Legs: Both Weakness of Arms/Hands: None  Permission Sought/Granted Permission sought to share information with : Case Manager, Family Supports, Customer service manager                Emotional Assessment   Attitude/Demeanor/Rapport: Gracious Affect (typically observed): Calm Orientation: : Oriented to  Self, Oriented to Place, Oriented to  Time, Oriented to Situation Alcohol / Substance Use: Not Applicable Psych Involvement: No (comment)  Admission diagnosis:  Dehydration  [E86.0] Generalized weakness [R53.1] Diarrhea, unspecified type [R19.7] Patient Active Problem List   Diagnosis Date Noted   Diarrhea 07/13/2021   Generalized weakness 07/13/2021   Acute hyponatremia 07/13/2021   HLD (hyperlipidemia) 07/13/2021   Pressure injury of skin 07/13/2021   Dehydration 07/12/2021   Protein-calorie malnutrition, severe 06/21/2021   LLL pneumonia 06/17/2021   DNR (do not resuscitate) 06/17/2021   COVID-19 virus infection 06/17/2021   Dysphagia 04/26/2021   Pulmonary infiltrates on CXR 04/26/2021   S/P left THA, AA 10/26/2019   Diaphoresis 11/22/2017   Shoulder pain 11/22/2017   Hyponatremia 11/22/2017   Chest pain 11/21/2017   Esophageal obstruction due to food impaction    Left carotid stenosis 08/02/2016   Carotid disease, bilateral ([place]) 09/14/2015   Carotid artery stenosis without cerebral infarction 08/30/2015   Other headache syndrome 08/10/2015   Nausea in adult 08/10/2015   Weight loss 08/08/2015   Hypothyroidism 05/09/2014   Essential hypertension 05/09/2014   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 [place] [address] Phone: [phone] Fax: [phone]  [id number] [address] [place] [address] Phone: [phone] Fax: [phone]  [facility] [address] [place] [address] Phone: [phone] Fax: [phone]     Social Determinants of Health (SDOH) Interventions    Readmission Risk Interventions Readmission Risk Prevention Plan 06/27/2021  Transportation Screening Complete  PCP or Specialist Appt within 5-7 Days Complete  Home Care Screening Complete   Medication Review (RN CM) Complete  Some recent data might be hidden

## 2021-07-15 NOTE — Progress Notes (Signed)
PROGRESS NOTE    Alexander Duran  GYK:[id number] DOB: 1945/09/16 DOA: 07/12/2021 PCP: Windy Fast, MD    Chief Complaint  Patient presents with   Diarrhea    Brief Narrative:  Alexander Duran is a 76 y.o. 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  Of note, patient was recently hospitalized starting 06/17/2021 for COVID-19 infection, with associated positive COVID-19 test on the day of admission. Earlier this am pt had trouble with left arm numbness and a code stroke was called. MRI of the brain was negative for acute stroke, pt has chronic right ICA stenosis.  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   Assessment & Plan:   Principal Problem:   Dehydration Active Problems:   Hypothyroidism   Diarrhea   Generalized weakness   Acute hyponatremia   HLD (hyperlipidemia)   Pressure injury of skin   Dehydration probably from diarrhea . Pt reports that he was constipated and he was asked to increased the rate of the tube feeds and take stool softeners and laxatives. Since then he had been having multiple episodes of diarrhea.  No diarrhea so far.  Since diarrhea has resolved we have started tube feeds.    Generalized weakness with some tingling and numbness in the fingers MRI of the brain is negative for acute intracranial abnormalities or acute stroke. 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 Neurology consulted recommended adding Plavix to the aspirin and statin. Pt reports the tingling and numbness improved.     Hypothermia, hypotension, SIRS  Hypothermia improved.  BP parameters improved with fluid boluses.  CXR  this morning showing left lower lobe consolidations. Will do a trial of antibiotics and watch him.  COVID-19 PCR is negative.  Urinalysis is negative for infection.    Hyponatremia Probably secondary to hypovolemia from dehydration in the setting of GI losses in the form of diarrhea. ? A component of SIADH.  SODIUM hasn't improved much . Its still around 127.  Differential include SIADH in the context of recent COVID-19 infection. Tsh elevated.  Continue to monitor.   Hypothyroidism TSH high, low free t3, free t4 level normal.  Increase synthroid to 100 mcg daily.    Hyperlipidemia Continue with statin.    Pressure injury Pressure Injury 07/13/21 Coccyx Stage 2 -  Partial thickness loss of dermis presenting as a shallow open injury with a red, pink wound bed without slough. (Active)  07/13/21 0000  Location: Coccyx  Location Orientation:   Staging: Stage 2 -  Partial thickness loss of dermis presenting as a shallow open injury with a red, pink wound bed without slough.  Wound Description (Comments):   Present on Admission: Yes   Wound care will be consulted.    Nutrition:  Dietary consulted for initiation of tube feeds.    Normocytic anemia Anemia of chronic diseas;  Hemoglobin around 10.9.   In view of deconditioning, recurrent admissions, palliative care consulted for goals of care discussions.    DVT prophylaxis: Heparin.  Code Status: DNR  Family Communication: none at bedside.  Disposition:   Status is: Inpatient  Remains inpatient appropriate because: hyponatremia.        Consultants:  Neurology.  Procedures: CTA head and neck.  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 Severe left neural foraminal stenosis at C3-4 and moderate bilateral neural foraminal stenosis at C4-5.  MRI brain:  Chronic right ICA occlusion.    Antimicrobials:None.   Subjective: Pt reported that he wanted to left alone. Frustrated. Reports that numbness has improved.   Objective: Vitals:   07/15/21 0019 07/15/21 0100 07/15/21 0429 07/15/21 0803  BP: 117/66 (!) 148/87 133/74 (!) 157/83  Pulse: (!) 50 (!) 51 (!) 52 (!) 50  Resp: (!) 9 15 15 11   Temp: (!) 97.4 F (36.3 C) (!) 97.5 F (36.4 C)  (!) 97.4 F (36.3 C)  TempSrc: Oral Oral  Oral  SpO2: 100% 100% 100% 100%  Weight:   55.6 kg   Height:        Intake/Output Summary (Last 24 hours) at 07/15/2021 0937 Last data filed at 07/15/2021 0804 Gross per 24 hour  Intake 2036.66 ml  Output 1800 ml  Net 236.66 ml    Filed Weights   07/14/21 1000 07/15/21 0429  Weight: 59.2 kg 55.6 kg    Examination:  General exam: elderly gentleman not in distress.  Respiratory system: Clear to auscultation. Respiratory effort normal. Cardiovascular system: S1 & S2 heard, RRR. No JVD, No pedal edema. Gastrointestinal system: Abdomen is nondistended, soft and nontender. Normal bowel sounds heard. Central nervous system: Alert and oriented. No focal neurological deficits. Extremities: no pedal edema.  Skin: No rashes, lesions or ulcers Psychiatry: anxious, frustrated.      Data Reviewed: I have personally reviewed following labs and imaging studies  CBC: Recent Labs  Lab 07/12/21 2126 07/13/21 0338 07/14/21 0609 07/15/21 0412  WBC 4.6 4.1 2.8* 4.2  NEUTROABS 3.7 2.9  --   --   HGB 10.9* 9.4* 9.7* 10.9*  HCT 31.9* 26.9* 27.3* 32.0*  MCV 90.6 90.6 90.4 90.4  PLT 356 323 297 371     Basic Metabolic Panel: Recent Labs  Lab 07/12/21 2126 07/13/21 0338 07/13/21 1723 07/13/21 1829 07/14/21 0609 07/14/21 1232 07/14/21 2035 07/15/21 0412  NA 125* 125* 124* 125* 127* 129* 127* 127*  K 4.8 4.5 4.3  --  4.2  --   --  3.8  CL 87* 93* 92*  --  96*  --   --  95*  CO2 29 25 24   --  21*  --   --  27  GLUCOSE 95 55* 61*  --  43*  --   --  86  BUN 20 16 13   --  15   --   --  9  CREATININE 0.62 0.59* 0.55*  --  0.63  --   --  0.49*  CALCIUM 9.3 8.5* 8.5*  --  8.1*  --   --  8.0*  MG 2.1 1.8  --   --   --   --   --  1.7  PHOS  --  3.0  --   --   --   --   --  3.1     GFR: Estimated Creatinine Clearance: 64.7 mL/min (A) (by C-G formula based on SCr of 0.49 mg/dL (L)).  Liver Function Tests: Recent Labs  Lab 07/12/21 2126 07/13/21 0338  AST 42* 34  ALT 32 27  ALKPHOS 74 59  BILITOT 0.6 0.4  PROT 6.5 5.3*  ALBUMIN 3.3* 2.8*     CBG: Recent Labs  Lab 07/14/21 1705 07/14/21 2044 07/15/21 0023 07/15/21 0431 07/15/21 0807  GLUCAP 92 80 85 89 88      Recent Results (from the past 240 hour(s))  Blood culture (routine x 2)     Status: None (Preliminary result)   Collection Time: 07/12/21  9:35 PM   Specimen: BLOOD  Result Value Ref Range Status   Specimen Description BLOOD SITE NOT SPECIFIED  Final   Special Requests   Final    BOTTLES DRAWN AEROBIC AND ANAEROBIC Blood Culture adequate volume   Culture   Final    NO GROWTH 3 DAYS Performed at [facility], [place] [address], [place], [place] [address]    Report Status PENDING  Incomplete  Resp Panel by RT-PCR (Flu A&B, Covid) Nasopharyngeal Swab     Status: None   Collection Time: 07/12/21  9:37 PM   Specimen: Nasopharyngeal Swab; Nasopharyngeal(NP) swabs in vial transport medium  Result Value Ref Range Status   SARS Coronavirus 2 by RT PCR NEGATIVE NEGATIVE Final    Comment: (NOTE) SARS-CoV-2 target nucleic acids are NOT DETECTED.  The SARS-CoV-2 RNA is generally detectable in upper respiratory specimens during the acute phase of infection. The lowest concentration of SARS-CoV-2 viral copies this assay can detect is 138 copies/mL. A negative result does not preclude SARS-Cov-2 infection and should not be used as the sole basis for treatment or other patient management decisions. A negative result may occur with  improper specimen collection/handling, submission of  specimen other than nasopharyngeal swab, presence of viral mutation(s) within the areas targeted by this assay, and inadequate number of viral copies(<138 copies/mL). A negative result must be combined with clinical observations, patient history, and epidemiological information. The expected result is Negative.  Fact Sheet for Patients:  EntrepreneurPulse.com.au  Fact Sheet for Healthcare Providers:  IncredibleEmployment.be  This test is no t yet approved or cleared by the [place] FDA and  has been authorized for detection and/or diagnosis of SARS-CoV-2 by FDA under an Emergency Use Authorization (EUA). This EUA will remain  in effect (meaning this test can be used) for the duration of the COVID-19 declaration under Section 564(b)(1) of the Act, 21 U.S.C.section 360bbb-3(b)(1), unless the authorization is terminated  or revoked sooner.       Influenza A by PCR NEGATIVE NEGATIVE Final   Influenza B by PCR NEGATIVE NEGATIVE Final    Comment: (NOTE) The Xpert Xpress SARS-CoV-2/FLU/RSV plus assay is intended as an aid in the diagnosis of influenza from Nasopharyngeal swab specimens and should not be used as a sole basis for treatment. Nasal washings and aspirates are unacceptable for Xpert Xpress SARS-CoV-2/FLU/RSV testing.  Fact Sheet for Patients: EntrepreneurPulse.com.au  Fact Sheet for Healthcare Providers: IncredibleEmployment.be  This test is not yet approved or cleared by the [place] FDA and has been authorized for detection and/or diagnosis of SARS-CoV-2 by FDA under an Emergency Use Authorization (EUA). This EUA will remain in effect (meaning this test can be used) for the duration of the COVID-19 declaration under Section 564(b)(1) of the Act, 21 U.S.C. section 360bbb-3(b)(1), unless the authorization is terminated or revoked.  Performed at [facility], [place] [address],  [place], [place] [address]   Blood culture (routine x 2)     Status: None (Preliminary result)   Collection Time: 07/12/21  9:40 PM   Specimen: BLOOD RIGHT FOREARM  Result Value Ref Range Status   Specimen Description BLOOD RIGHT FOREARM  Final   Special Requests  Final    BOTTLES DRAWN AEROBIC AND ANAEROBIC Blood Culture results may not be optimal due to an inadequate volume of blood received in culture bottles   Culture   Final    NO GROWTH 3 DAYS Performed at [facility], [place] [address] [place], [place] [address]    Report Status PENDING  Incomplete          Radiology Studies: DG CHEST PORT 1 VIEW  Result Date: 07/14/2021 CLINICAL DATA:  Fever Evaluate atelectasis or infiltrate. pleural effusion. pneumothorax. EXAM: PORTABLE CHEST 1 VIEW COMPARISON:  07/12/2021 and older studies. FINDINGS: Left retrocardiac lung base opacity is similar to the most recent prior exam. Remainder of the lungs is clear. Lungs are hyperexpanded. Possible small left effusion.  No evidence of a pneumothorax. Cardiac silhouette normal in size.  No mediastinal or hilar masses. IMPRESSION: 1. No significant change from the most recent prior study. 2. Left lower lobe, retrocardiac, opacity is consistent with pneumonia, atelectasis or a combination. Electronically Signed   By: Lajean Manes M.D.   On: 07/14/2021 09:29        Scheduled Meds:  aspirin  81 mg Per Tube QPM   atorvastatin  20 mg Per Tube Daily   clopidogrel  75 mg Per Tube Daily   free water  30 mL Per Tube Q4H   furosemide  10 mg Per Tube BID   gabapentin  100 mg Per Tube BID   heparin injection (subcutaneous)  5,000 Units Subcutaneous Q8H   levothyroxine  100 mcg Per Tube QAC breakfast   loratadine  10 mg Per Tube Daily   midodrine  5 mg Per Tube TID WC   multivitamin with minerals  1 tablet Per Tube Daily   urea  15 g Per Tube BID   Continuous Infusions:  sodium chloride 100 mL/hr at 07/15/21 0653   azithromycin 500 mg (07/14/21  1323)   cefTRIAXone (ROCEPHIN)  IV 1 g (07/15/21 0053)   feeding supplement (OSMOLITE 1.5 CAL) 40 mL/hr at 07/15/21 0535     LOS: 2 days        Hosie Poisson, MD [facility]   To contact the attending provider between 7A-7P or the covering provider during after hours 7P-7A, please log into the web site [email or web address] and access using universal [place] password for that web site. If you do not have the password, please call the hospital operator.  07/15/2021, 9:37 AM

## 2021-07-15 NOTE — Consult Note (Signed)
Palliative Care Consult Note                                  Date: 07/15/2021   Patient Name: Alexander Duran  DOB: 10/28/1945  MRN: [id number]  Age / Sex: 76 y.o., male  PCP: Windy Fast, MD Referring Physician: Hosie Poisson, MD  Reason for Consultation: Establishing goals of care  HPI/Patient Profile: 76 y.o. 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  Of note, patient was recently hospitalized starting 06/17/2021 for COVID-19 infection, with associated positive COVID-19 test on the day of admission. admitted on 07/12/2021 with dehydration, diarrhea, generalized weakness, malnutrition, and others.   PMT was consulted for goals of care conversation  Past Medical History:  Diagnosis Date   Arthritis    Cancer ([place])    Tonsil cancer   Carotid artery stenosis without cerebral infarction 08/30/2015   Difficulty in swallowing    History of radiation therapy    Hypertension    Hypothyroidism 05/09/2014   Other headache syndrome 08/10/2015   Sleep apnea    Weight loss 08/08/2015    Subjective:   This NP Walden Field reviewed medical records, received report from team, assessed the patient and then meet at the patient's bedside to discuss diagnosis, prognosis, GOC, EOL wishes disposition and options.  I met with the patient at the bedside.  No family was present   Concept of Palliative Care was introduced as specialized medical care for people and their families living with serious illness.  If focuses on providing relief from the symptoms and stress of a serious illness.  The goal is to improve quality of life for both the patient and the family. Values and goals of care important to patient and  family were attempted to be elicited.  Created space and opportunity for patient  and family to explore thoughts and feelings regarding current medical situation   Natural trajectory and current clinical status were discussed. Questions and concerns addressed. Patient  encouraged to call with questions or concerns.    Patient/Family Understanding of Illness: He states that when he went home with the G-tube initially things went great.  His vitals were good and he was getting his tube feeds.  PT and OT was going well.  [facility] and they said his sodium was low so they increased his tube feeding and apparently change the formulation.  He became constipated and was started on a stool softener and after taking 1-2 doses of Colace (which she had to chew and spit out the capsule lining because he cannot swallow) he had diarrhea for 24 hours straight and became weak and dehydrated so he went to the ER.  In the grand scheme of things he admits "I know I am dying.  He notes that at this point he knows he is not able to sustain himself to where he can get better and he does not see himself getting significantly better.  Life Review: Deferred  Patient Values: Comfort, dignity  Goals: Consideration of palliative care versus hospice services  Today's Discussion: I had a prolonged discussion with the patient where we reviewed his previous illnesses, underlying comorbidities, and acute  illness now.  He again shared that he knows he is dying and that he is thankful for the life he had.  He seems to not want to be a burden on his family.  He has a previous experience with [facility] services.  He is requesting evaluation for home palliative.  We had a good discussion about palliative care as an outpatient versus hospice at home.  We discussed the nuances, differences in philosophy, and differences in levels of support.  He states that he would like to speak to  someone from [facility] to help him decide which is most appropriate.  I shared that I feel hospice is most appropriate in his current situation.  I agreed to engage [facility] hospice and palliative care to come meet with him.  Overall he seems at peace with his current situation.  I provided emotional general support through therapeutic listening, therapeutic silence, empathy, sharing of stories, and other techniques.  I answered all questions and addressed all concerns to the best of my ability.  Review of Systems  Constitutional:  Positive for fatigue.  Respiratory:  Negative for chest tightness and shortness of breath.   Cardiovascular:  Negative for chest pain.  Gastrointestinal:  Negative for abdominal pain, diarrhea (resolved), nausea and vomiting.  Neurological:  Positive for weakness.   Objective:   Primary Diagnoses: Present on Admission:  Dehydration  Diarrhea  Acute hyponatremia  Hypothyroidism  HLD (hyperlipidemia)   Physical Exam Vitals and nursing note reviewed.  Constitutional:      General: He is not in acute distress.    Appearance: He is cachectic. He is ill-appearing.     Comments: Appears weak  HENT:     Head: Normocephalic and atraumatic.  Cardiovascular:     Rate and Rhythm: Normal rate and regular rhythm.  Pulmonary:     Effort: Pulmonary effort is normal. No respiratory distress.     Breath sounds: No wheezing or rhonchi.  Abdominal:     General: Abdomen is flat.     Palpations: Abdomen is soft.  Skin:    General: Skin is warm and dry.  Neurological:     General: No focal deficit present.     Mental Status: He is alert.  Psychiatric:        Mood and Affect: Mood normal.        Behavior: Behavior normal.    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 He has not done well with this after discharge and is back for diarrhea, dehydration. He states he knows he's dying and doesn't see himself getting better. He has requested engaging [facility] palliative vs. Hospice.  SUMMARY OF RECOMMENDATIONS   TOC referral for engagement of [facility] hospice vs. Palliative Continued current treatments otherwise Continued emotional support of patient and family PMT will continue to follow  Symptom Management:  Per primary  team PMT is available to assist as needed  Prognosis:  Unable to determine  Discharge Planning:  To Be Determined   Discussed with: Medical team, nursing team, patient   Thank you for allowing [place] to participate in the care of Sheralyn Boatman PMT will continue to support holistically.  Time Total: 75 mins  Greater than 50%  of this time was spent counseling and coordinating care related to the above assessment and plan.  Signed by: Walden Field, NP Palliative Medicine Team  Team Phone # [phone] (Nights/Weekends)  07/15/2021, 2:04 PM

## 2021-07-15 NOTE — Progress Notes (Signed)
[facility])   Request received to discuss outpatient palliative services in the home versus hospice services in the home from PMT.  Per PMT, pt would like to discuss but was not ready to do so today.   [facility] will plan to meet with the patient Monday morning to discuss services we can provide.  Thank you, Venia Carbon RN, BSN [facility] Liaison

## 2021-07-15 NOTE — Progress Notes (Addendum)
[facility] Progress Note  Subjective: seen in room. No c/o's.  Na+ dropped to 127 this am on NS at 100 cc/hr.   Vitals:   07/15/21 0019 07/15/21 0100 07/15/21 0429 07/15/21 0803  BP: 117/66 (!) 148/87 133/74 (!) 157/83  Pulse: (!) 50 (!) 51 (!) 52 (!) 50  Resp: (!) 9 15 15 11   Temp: (!) 97.4 F (36.3 C) (!) 97.5 F (36.4 C)  (!) 97.4 F (36.3 C)  TempSrc: Oral Oral  Oral  SpO2: 100% 100% 100% 100%  Weight:   55.6 kg   Height:        Exam: Gen alert, no distress No jvd or bruits, postop R neck tissue loss (chronic) Chest clear bilat to bases, no rales/ wheezing RRR no MRG Abd soft ntnd no mass or ascites +bs Ext no LE or UE edema, no wounds or ulcers Neuro is alert, Ox 3 , nf       Home meds include - asa 81, lipitor 20, synthroid, midodrine 5 tid per tube, osmolite prn, protonix 40 qd, prns/ vits/ supps       UA 07/13/21 - negative     Serum osm 256, UNa 33,  UOsm 533 ^     Na 125- 129,  BUN 15  cr 0.63  CO2 21  CA 8.5  Alb 3.0  uric acid 2.7  LFT's ok    WBC 2.8K  Hb 9.7  plt wnl     CXR = COPD/ flattened diaphragms, no edema of consolidation     Current meds = asa, lipitor, plavix, 30 ml q 4hrs free water, nurontin 100 bid, hep SQ tid, midodrine 5 mg ac tid, ocuvite-lutein, IV azithro/ rocephin, osmolite TF's, free water 30 ml q 4h, neurontin 100 bid, synthroid, MSO4 prn.             Assessment/ Plan: Hyponatremia - pt recently started on G-tube feeds due to long-term damage of radiation therapy on his swallowing/ esophageal function. Started in early January 2023. Admitted w/ uncontrolled diarrhea for 24hrs, in ED Na+ was 125. He got IVF's and improved then got worse again. UNa 33 and UOsm is high. Overnight didn't respond to more isotonic fluids, instead Na+ dropped again. This may be a more SIADH-like picture (pain, nausea, other). Have dc'd the IVF"s. Will start on po urea and low-dose po lasix. Needs fluid restriction 1000cc also, but w/ G-tube feeds  that may well be difficult to maintain. Will follow.  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   Inpatient medications:  aspirin  81 mg Per Tube QPM   atorvastatin  20 mg Per Tube Daily   clopidogrel  75 mg Per Tube Daily   free water  30 mL Per Tube Q4H   furosemide  10 mg Per Tube BID   gabapentin  100 mg Per Tube BID   heparin injection (subcutaneous)  5,000 Units Subcutaneous Q8H   levothyroxine  100 mcg Per Tube QAC breakfast   loratadine  10 mg Per Tube Daily   midodrine  5 mg Per Tube TID WC   multivitamin with minerals  1 tablet Per Tube Daily   urea  15 g Per Tube BID    azithromycin 500 mg (07/14/21 1323)   cefTRIAXone (ROCEPHIN)  IV 1 g (07/15/21 0053)   feeding supplement (OSMOLITE 1.5 CAL) 40 mL/hr at 07/15/21 0535   acetaminophen **OR** acetaminophen, morphine injection

## 2021-07-16 DIAGNOSIS — E860 Dehydration: Secondary | ICD-10-CM | POA: Diagnosis not present

## 2021-07-16 DIAGNOSIS — E871 Hypo-osmolality and hyponatremia: Secondary | ICD-10-CM | POA: Diagnosis not present

## 2021-07-16 DIAGNOSIS — R531 Weakness: Secondary | ICD-10-CM | POA: Diagnosis not present

## 2021-07-16 DIAGNOSIS — R197 Diarrhea, unspecified: Secondary | ICD-10-CM | POA: Diagnosis not present

## 2021-07-16 LAB — SODIUM
Sodium: 126 mmol/L — ABNORMAL LOW (ref 135–145)
Sodium: 127 mmol/L — ABNORMAL LOW (ref 135–145)

## 2021-07-16 LAB — GLUCOSE, CAPILLARY
Glucose-Capillary: 105 mg/dL — ABNORMAL HIGH (ref 70–99)
Glucose-Capillary: 62 mg/dL — ABNORMAL LOW (ref 70–99)
Glucose-Capillary: 82 mg/dL (ref 70–99)
Glucose-Capillary: 89 mg/dL (ref 70–99)
Glucose-Capillary: 91 mg/dL (ref 70–99)
Glucose-Capillary: 97 mg/dL (ref 70–99)
Glucose-Capillary: 99 mg/dL (ref 70–99)

## 2021-07-16 LAB — BASIC METABOLIC PANEL
Anion gap: 7 (ref 5–15)
BUN: 23 mg/dL (ref 8–23)
CO2: 29 mmol/L (ref 22–32)
Calcium: 8.4 mg/dL — ABNORMAL LOW (ref 8.9–10.3)
Chloride: 91 mmol/L — ABNORMAL LOW (ref 98–111)
Creatinine, Ser: 0.49 mg/dL — ABNORMAL LOW (ref 0.61–1.24)
GFR, Estimated: >60 mL/min (ref >60)
Glucose, Bld: 93 mg/dL (ref 70–99)
Potassium: 4.3 mmol/L (ref 3.5–5.1)
Sodium: 127 mmol/L — ABNORMAL LOW (ref 135–145)

## 2021-07-16 LAB — METHYLMALONIC ACID, SERUM: Methylmalonic Acid, Quantitative: 106 nmol/L (ref 0–378)

## 2021-07-16 LAB — CORTISOL: Cortisol, Plasma: 16 ug/dL

## 2021-07-16 MED ORDER — LOPERAMIDE HCL 1 MG/7.5ML PO SUSP
2.0000 mg | ORAL | Status: DC | PRN
  Filled 2021-07-16: qty 15

## 2021-07-16 MED ORDER — UREA 15 G PO PACK
30.0000 g | PACK | Freq: Two times a day (BID) | ORAL | Status: DC
  Administered 2021-07-16 – 2021-07-25 (×18): 30 g
  Filled 2021-07-16 (×20): qty 2

## 2021-07-16 MED ORDER — DEXTROSE 50 % IV SOLN
INTRAVENOUS | Status: AC
  Administered 2021-07-16: 25 mL
  Filled 2021-07-16: qty 50

## 2021-07-16 MED ORDER — DEXTROSE 50 % IV SOLN: 25.0000 mL | Freq: Once | INTRAVENOUS | Status: AC

## 2021-07-16 NOTE — Consult Note (Signed)
[facility] Nurse Consult Note: Reason for Consult: Consult requested for sacrum.  Pt is frequently incontinent of large amt loose stools and it is difficult to keep the location from becoming soiled. He is very emaciated with a protruding sacrum bone. Wound type: Stage 2 pressure injury to sacrum; 1.5X1.5X.1cm, pink and moist. Pressure Injury POA: Yes Dressing procedure/placement/frequency: Topical treatment orders provided for bedside nurses to perform as follows: Apply small foam dressing (not large sacrum dressing) over sacrum location and change Q 3 days or PRN soiling. Please re-consult if further assistance is needed.  Thank-you,  Julien Girt MSN, [place], [place], [place], [place]

## 2021-07-16 NOTE — Progress Notes (Signed)
Nephrology Follow-Up Consult note   Assessment/Recommendations: Alexander Duran is a/an 76 y.o. 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     Dehydration: Secondary to diarrhea.  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  Sodium relatively stable 127.  Continue with fluid restriction and Lasix 10 mg twice daily.  Increase urea to 30 g twice daily.  Goal sodium 133 by tomorrow morning.  Monitor sodium twice daily  Generalized weakness: Management per primary team.  Agree with palliative care involvement  Hypothyroidism: TSH high here.  Synthroid recently increased  Hyperlipidemia: Statin     Recommendations conveyed to primary service.    [facility] 07/16/2021 10:33 AM  ___________________________________________________________  CC: Hyponatremia  Interval History/Subjective: Patient states he feels pretty terrible today.  Frustrated that things are going better.  Frustrated that he is incontinent of stool.  Unable to voice any more specific complaints.  Sodium decreased to 124 yesterday afternoon but 127 this morning.  2.7 L urine output.  Medications:  Current Facility-Administered Medications  Medication Dose Route Frequency Provider Last Rate Last Admin   acetaminophen (TYLENOL) tablet 650 mg  650 mg Per Tube Q6H PRN Howerter, Justin B, DO   650 mg at 07/15/21 1124   Or   acetaminophen (TYLENOL) suppository 650 mg  650 mg Rectal Q6H PRN Howerter, Justin B, DO       ALPRAZolam Duanne Moron) tablet 0.25 mg  0.25 mg Per Tube BID PRN Hosie Poisson, MD   0.25 mg at 07/15/21 2135   aspirin chewable tablet 81 mg  81 mg Per Tube QPM Howerter, Justin B, DO   81 mg at 07/15/21 1809   atorvastatin (LIPITOR) tablet 20 mg  20 mg Per Tube Daily Howerter, Justin B, DO   20 mg  at 07/16/21 0918   azithromycin (ZITHROMAX) 500 mg in sodium chloride 0.9 % 250 mL IVPB  500 mg Intravenous Q24H Hosie Poisson, MD 250 mL/hr at 07/16/21 0937 500 mg at 07/16/21 0937   cefTRIAXone (ROCEPHIN) 1 g in sodium chloride 0.9 % 100 mL IVPB  1 g Intravenous Q24H Hosie Poisson, MD 200 mL/hr at 07/15/21 2138 1 g at 07/15/21 2138   clopidogrel (PLAVIX) tablet 75 mg  75 mg Per Tube Daily Hosie Poisson, MD   75 mg at 07/16/21 0921   feeding supplement (OSMOLITE 1.5 CAL) liquid 1,000 mL  1,000 mL Per Tube Continuous Hosie Poisson, MD 60 mL/hr at 07/16/21 0048 1,000 mL at 07/16/21 0048   free water 30 mL  30 mL Per Tube Q4H Hosie Poisson, MD   30 mL at 07/16/21 7628   furosemide (LASIX) tablet 10 mg  10 mg Per Tube BID Roney Jaffe, MD   10 mg at 07/16/21 0919   gabapentin (NEURONTIN) 250 MG/5ML solution 100 mg  100 mg Per Tube BID Hosie Poisson, MD   100 mg at 07/16/21 0931   heparin injection 5,000 Units  5,000 Units Subcutaneous Q8H Hosie Poisson, MD   5,000 Units at 07/16/21 0533   levothyroxine (SYNTHROID) tablet 100 mcg  100 mcg Per Tube QAC breakfast Hosie Poisson, MD   100 mcg at 07/16/21 0533   loperamide HCl (IMODIUM) 1 MG/7.5ML suspension 2 mg  2 mg Per Tube PRN Hosie Poisson, MD       loratadine (CLARITIN) tablet 10 mg  10 mg Per Tube  Daily Howerter, Justin B, DO   10 mg at 07/16/21 0919   midodrine (PROAMATINE) tablet 5 mg  5 mg Per Tube TID WC Howerter, Justin B, DO   5 mg at 07/14/21 1703   morphine 2 MG/ML injection 2 mg  2 mg Intravenous Q2H PRN Howerter, Justin B, DO       multivitamin with minerals tablet 1 tablet  1 tablet Per Tube Daily Hosie Poisson, MD   1 tablet at 07/16/21 0918   urea (URE-NA) oral packet 30 g  30 g Per Tube BID Reesa Chew, MD   30 g at 07/16/21 9450      Review of Systems: 10 systems reviewed and negative except per interval history/subjective  Physical Exam: Vitals:   07/16/21 0413 07/16/21 0800  BP: (!) 145/76 133/75  Pulse: (!) 52 (!)  57  Resp: 10 16  Temp:  (!) 97.4 F (36.3 C)  SpO2: 99% 100%   Total I/O In: -  Out: 500 [Urine:500]  Intake/Output Summary (Last 24 hours) at 07/16/2021 1033 Last data filed at 07/16/2021 0809 Gross per 24 hour  Intake 1691.33 ml  Output 2700 ml  Net -1008.67 ml   Constitutional: Chronically ill-appearing, lying in bed, no distress ENMT: ears and nose without scars or lesions, dry mucous membranes CV: normal rate, no edema Respiratory: Bilateral chest rise, normal work of breathing Gastrointestinal: soft, non-tender, bowel sounds present Skin: no visible lesions or rashes Psych: alert, judgement/insight appropriate, depressed mood, appropriate affect   Test Results I personally reviewed new and old clinical labs and radiology tests Lab Results  Component Value Date   NA 127 (L) 07/16/2021   K 4.3 07/16/2021   CL 91 (L) 07/16/2021   CO2 29 07/16/2021   BUN 23 07/16/2021   CREATININE 0.49 (L) 07/16/2021   CALCIUM 8.4 (L) 07/16/2021   ALBUMIN 2.8 (L) 07/13/2021   PHOS 3.1 07/15/2021

## 2021-07-16 NOTE — Progress Notes (Signed)
PROGRESS NOTE    Alexander Duran  GYI:[id number] DOB: 05-07-1946 DOA: 07/12/2021 PCP: Windy Fast, MD    Chief Complaint  Patient presents with   Diarrhea    Brief Narrative:  Alexander Duran is a 76 y.o. 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  Of note, patient was recently hospitalized starting 06/17/2021 for COVID-19 infection, with associated positive COVID-19 test on the day of admission. Earlier this am pt had trouble with left arm numbness and a code stroke was called. MRI of the brain was negative for acute stroke, pt has chronic right ICA stenosis.  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 Pt seen and examined at bedside. [facility] at bedside.  Pt wants to go home ultimately.    Assessment & Plan:   Principal Problem:   Dehydration Active Problems:   Hypothyroidism   Diarrhea   Generalized weakness   Acute hyponatremia   HLD (hyperlipidemia)   Pressure injury of skin   Dehydration probably from diarrhea . Pt reports that he was constipated and he was asked to increased the rate of the tube feeds and take stool softeners and laxatives. Since then he had been having multiple episodes of diarrhea.  No diarrhea so far.  Since diarrhea has improved, tube feeds were restarted.    Generalized weakness with some tingling and numbness in the fingers MRI of the brain is negative for acute intracranial abnormalities or acute stroke. 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 Neurology consulted recommended adding Plavix to the aspirin and statin. Pt reports the tingling and numbness improved.     Hypothermia,  hypotension, SIRS  Hypothermia improved.  BP parameters improved with fluid boluses.  CXR this morning showing left lower lobe consolidations. Will do a trial of antibiotics and watch him.  COVID-19 PCR is negative.  Urinalysis is negative for infection.    Hyponatremia Probably secondary to hypovolemia from dehydration in the setting of GI losses in the form of diarrhea. ? A component of SIADH.  SODIUM hasn't improved much . Its still around 127.  Differential include SIADH in the context of recent COVID-19 infection. Continue with fluid restriction, continue with urea and lasix . Appreciate nephrology recommendations.  Tsh elevated. Cortisol is wnl this morning.  Continue to monitor.   Hypothyroidism TSH high, low free t3, free t4 level normal.  Increased synthroid to 100 mcg daily.    Hyperlipidemia Continue with statin.    Pressure injury Pressure Injury 07/13/21 Sacrum Medial Stage 2 -  Partial thickness loss of dermis presenting as a shallow open injury with a red, pink wound bed without slough. (Active)  07/13/21 0000  Location: Sacrum  Location Orientation: Medial  Staging: Stage 2 -  Partial thickness loss of dermis presenting as a shallow open injury with a red, pink wound bed without slough.  Wound Description (Comments):   Present on Admission: Yes   Wound care consulted.    Nutrition:  Dietary consulted for initiation of tube feeds.    Normocytic anemia Anemia of chronic diseas;  Hemoglobin around 10.9.    Hypoglycemia:  - unclear , asymptomatic. On tube feeds. Will continue to monitor.   In view of deconditioning,  recurrent admissions, palliative care consulted for goals of care discussions.    DVT prophylaxis: Heparin.  Code Status: DNR  Family Communication: none at bedside.  Disposition:   Status is: Inpatient  Remains inpatient appropriate because: hyponatremia.        Consultants:  Neurology.   Procedures: CTA head and neck.   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 Severe left neural foraminal stenosis at C3-4 and moderate bilateral neural foraminal stenosis at C4-5.  MRI brain:  Chronic right ICA occlusion.   Antimicrobials:None.   Subjective: No new complaints.  Objective: Vitals:   07/15/21 2355 07/16/21 0413 07/16/21 0800 07/16/21 1150  BP: (!) 143/81 (!) 145/76 133/75 110/73  Pulse: 63 (!) 52 (!) 57 61  Resp: 13 10 16 16   Temp:   (!) 97.4 F (36.3 C)   TempSrc:   Oral Oral  SpO2: 99% 99% 100% 100%  Weight:      Height:        Intake/Output Summary (Last 24 hours) at 07/16/2021 1204 Last data filed at 07/16/2021 1046 Gross per 24 hour  Intake 1691.33 ml  Output 3100 ml  Net -1408.67 ml    Filed Weights   07/14/21 1000 07/15/21 0429  Weight: 59.2 kg 55.6 kg    Examination:  General exam: elderly patient, not in distress.  Respiratory system: Clear to auscultation. Respiratory effort normal. Cardiovascular system: S1 & S2 heard, RRR. No JVD, No pedal edema. Gastrointestinal system: Abdomen is nondistended, soft and nontender. Normal bowel sounds heard. Central nervous system: Alert and oriented. No focal neurological deficits. Extremities: Symmetric 5 x 5 power. Skin: No rashes, lesions or ulcers Psychiatry: Mood & affect appropriate.        Data Reviewed: I have personally reviewed following labs and imaging studies  CBC: Recent Labs  Lab 07/12/21 2126 07/13/21 0338 07/14/21 0609 07/15/21 0412  WBC 4.6 4.1 2.8* 4.2  NEUTROABS 3.7 2.9  --   --   HGB 10.9* 9.4* 9.7* 10.9*  HCT 31.9* 26.9* 27.3* 32.0*  MCV 90.6 90.6 90.4 90.4  PLT 356 323 297 371     Basic Metabolic Panel: Recent Labs  Lab 07/12/21 2126 07/13/21 0338 07/13/21 1723 07/13/21 1829 07/14/21 0609 07/14/21 1232 07/14/21 2035 07/15/21 0412 07/15/21 1231 07/15/21 2109 07/16/21 0609   NA 125* 125* 124*   < > 127*   < > 127* 127* 128* 124* 127*  K 4.8 4.5 4.3  --  4.2  --   --  3.8  --   --  4.3  CL 87* 93* 92*  --  96*  --   --  95*  --   --  91*  CO2 29 25 24   --  21*  --   --  27  --   --  29  GLUCOSE 95 55* 61*  --  43*  --   --  86  --   --  93  BUN 20 16 13   --  15  --   --  9  --   --  23  CREATININE 0.62 0.59* 0.55*  --  0.63  --   --  0.49*  --   --  0.49*  CALCIUM 9.3 8.5* 8.5*  --  8.1*  --   --  8.0*  --   --  8.4*  MG 2.1 1.8  --   --   --   --   --  1.7  --   --   --   PHOS  --  3.0  --   --   --   --   --  3.1  --   --   --    < > = values in this interval not displayed.     GFR: Estimated Creatinine Clearance: 64.7 mL/min (A) (by C-G formula based on SCr of 0.49 mg/dL (L)).  Liver Function Tests: Recent Labs  Lab 07/12/21 2126 07/13/21 0338  AST 42* 34  ALT 32 27  ALKPHOS 74 59  BILITOT 0.6 0.4  PROT 6.5 5.3*  ALBUMIN 3.3* 2.8*     CBG: Recent Labs  Lab 07/15/21 2033 07/15/21 2358 07/16/21 0418 07/16/21 0806 07/16/21 1147  GLUCAP 112* 97 99 82 62*      Recent Results (from the past 240 hour(s))  Blood culture (routine x 2)     Status: None (Preliminary result)   Collection Time: 07/12/21  9:35 PM   Specimen: BLOOD  Result Value Ref Range Status   Specimen Description BLOOD SITE NOT SPECIFIED  Final   Special Requests   Final    BOTTLES DRAWN AEROBIC AND ANAEROBIC Blood Culture adequate volume   Culture   Final    NO GROWTH 3 DAYS Performed at [facility], [place] [address], [place], [place] [address]    Report Status PENDING  Incomplete  Resp Panel by RT-PCR (Flu A&B, Covid) Nasopharyngeal Swab     Status: None   Collection Time: 07/12/21  9:37 PM   Specimen: Nasopharyngeal Swab; Nasopharyngeal(NP) swabs in vial transport medium  Result Value Ref Range Status   SARS Coronavirus 2 by RT PCR NEGATIVE NEGATIVE Final    Comment: (NOTE) SARS-CoV-2 target nucleic acids are NOT DETECTED.  The SARS-CoV-2 RNA is  generally detectable in upper respiratory specimens during the acute phase of infection. The lowest concentration of SARS-CoV-2 viral copies this assay can detect is 138 copies/mL. A negative result does not preclude SARS-Cov-2 infection and should not be used as the sole basis for treatment or other patient management decisions. A negative result may occur with  improper specimen collection/handling, submission of specimen other than nasopharyngeal swab, presence of viral mutation(s) within the areas targeted by this assay, and inadequate number of viral copies(<138 copies/mL). A negative result must be combined with clinical observations, patient history, and epidemiological information. The expected result is Negative.  Fact Sheet for Patients:  EntrepreneurPulse.com.au  Fact Sheet for Healthcare Providers:  IncredibleEmployment.be  This test is no t yet approved or cleared by the [place] FDA and  has been authorized for detection and/or diagnosis of SARS-CoV-2 by FDA under an Emergency Use Authorization (EUA). This EUA will remain  in effect (meaning this test can be used) for the duration of the COVID-19 declaration under Section 564(b)(1) of the Act, 21 U.S.C.section 360bbb-3(b)(1), unless the authorization is terminated  or revoked sooner.       Influenza A by PCR NEGATIVE NEGATIVE Final   Influenza B by PCR NEGATIVE NEGATIVE Final    Comment: (NOTE) The Xpert Xpress SARS-CoV-2/FLU/RSV plus assay is intended as an aid in the diagnosis of influenza from Nasopharyngeal swab specimens and should not be used as a sole basis for treatment. Nasal washings and aspirates are unacceptable for Xpert Xpress SARS-CoV-2/FLU/RSV testing.  Fact Sheet for Patients: EntrepreneurPulse.com.au  Fact Sheet for Healthcare Providers: IncredibleEmployment.be  This test is not yet approved or cleared by the [place] FDA  and has been authorized for detection and/or diagnosis of SARS-CoV-2 by FDA under an Emergency Use Authorization (EUA). This EUA will remain in effect (meaning this test can be used) for the duration of the COVID-19 declaration under Section 564(b)(1) of the Act, 21 U.S.C. section 360bbb-3(b)(1), unless the authorization is terminated or revoked.  Performed at [facility], [place] [address] [place], [place] [address]   Blood culture (routine x 2)     Status: None (Preliminary result)   Collection Time: 07/12/21  9:40 PM   Specimen: BLOOD RIGHT FOREARM  Result Value Ref Range Status   Specimen Description BLOOD RIGHT FOREARM  Final   Special Requests   Final    BOTTLES DRAWN AEROBIC AND ANAEROBIC Blood Culture results may not be optimal due to an inadequate volume of blood received in culture bottles   Culture   Final    NO GROWTH 3 DAYS Performed at [facility], [place] [address], [place], [place] [address]    Report Status PENDING  Incomplete          Radiology Studies: No results found.      Scheduled Meds:  aspirin  81 mg Per Tube QPM   atorvastatin  20 mg Per Tube Daily   clopidogrel  75 mg Per Tube Daily   free water  30 mL Per Tube Q4H   furosemide  10 mg Per Tube BID   gabapentin  100 mg Per Tube BID   heparin injection (subcutaneous)  5,000 Units Subcutaneous Q8H   levothyroxine  100 mcg Per Tube QAC breakfast   loratadine  10 mg Per Tube Daily   midodrine  5 mg Per Tube TID WC   multivitamin with minerals  1 tablet Per Tube Daily   urea  30 g Per Tube BID   Continuous Infusions:  azithromycin 500 mg (07/16/21 0937)   cefTRIAXone (ROCEPHIN)  IV 1 g (07/15/21 2138)   feeding supplement (OSMOLITE 1.5 CAL) 1,000 mL (07/16/21 0048)     LOS: 3 days        Hosie Poisson, MD [facility]   To contact the attending provider between 7A-7P or the covering provider during after hours 7P-7A, please log into the web site  [email or web address] and access using universal [place] password for that web site. If you do not have the password, please call the hospital operator.  07/16/2021, 12:04 PM

## 2021-07-16 NOTE — Progress Notes (Addendum)
OT Cancellation Note  Patient Details Name: Alexander Duran MRN: [id number] DOB: 01-29-1946   Cancelled Treatment:    Reason Eval/Treat Not Completed: Patient declined, no reason specified- pt declined  OT at this time, reports "I want to get up, but I"m just not feeling up to it."  Pt voices concern about not having control of his bowels, offered briefs but pt declined.  Voicing "I have a lot on my mind".  Pt requests OT to return in a couple days.  Will follow and see as able.   Jolaine Artist, OT Acute Rehabilitation Services Pager [phone] Office [phone]   Delight Stare 07/16/2021, 1:29 PM

## 2021-07-16 NOTE — Progress Notes (Addendum)
Zacarias Pontes [phone] [facility])  Hospital Liaison RN note  Received request from [facility], [name] for hospice services at home after discharge. Chart and patient information under review by Hospice physician. Hospice eligibility pending at this time.   Met with patient in room to initiate education related to hospice philosophy, services, and team approach to care as well as answer questions. Patient/family verbalized understanding of information given. Per discussion, the plan for discharge home is still undetermined at this time but likely 2-3 days.  DME needs discussed. Patient has the following equipment in the home. Walker, cane, BSC, shower chair. Patient denies any equipment needs at this time, he is currently on oxygen but is very hopeful he will not need this at discharge.   Please send signed and completed DNR with patient/family. Please provide symptoms at discharge as needed for ongoing symptom management.   [facility] information and contact numbers given to patient.  Please call with any hospice related questions or concerns.   Thank you for the opportunity to participate in this patient's care.  Jhonnie Garner, Therapist, sports, BSN [employer] [phone]

## 2021-07-16 NOTE — Progress Notes (Signed)
PT Cancellation Note  Patient Details Name: Alexander Duran MRN: [id number] DOB: [birth year]   Cancelled Treatment:    Reason Eval/Treat Not Completed: Patient declined, no reason specified Attempted to see pt with OT, however pt stating, "I want to get up, but I"m just not feeling up to it."  Pt voices concern about not having control of his bowels, offered briefs but pt declined.  Voicing "I have a lot on my mind".    **Per notes, pt plans to d/c home with hospice. Also of note, pt with stage 2 pressure wound on sacrum. If pt plans to d/c home, recommend hospital bed with overlay mattress to protect skin/further wounds from developing. May need lift pending mobility which has not been assessed at this time. Will follow up as able.   Marguarite Arbour A Saidee Geremia 07/16/2021, 4:17 PM Marisa Severin, PT, DPT Acute Rehabilitation Services Pager [phone] Office [phone]

## 2021-07-17 ENCOUNTER — Inpatient Hospital Stay (HOSPITAL_COMMUNITY): Payer: No Typology Code available for payment source

## 2021-07-17 DIAGNOSIS — E860 Dehydration: Secondary | ICD-10-CM | POA: Diagnosis not present

## 2021-07-17 DIAGNOSIS — E871 Hypo-osmolality and hyponatremia: Secondary | ICD-10-CM | POA: Diagnosis not present

## 2021-07-17 DIAGNOSIS — Z66 Do not resuscitate: Secondary | ICD-10-CM

## 2021-07-17 DIAGNOSIS — R197 Diarrhea, unspecified: Secondary | ICD-10-CM | POA: Diagnosis not present

## 2021-07-17 DIAGNOSIS — R531 Weakness: Secondary | ICD-10-CM | POA: Diagnosis not present

## 2021-07-17 DIAGNOSIS — E785 Hyperlipidemia, unspecified: Secondary | ICD-10-CM

## 2021-07-17 LAB — GLUCOSE, CAPILLARY
Glucose-Capillary: 103 mg/dL — ABNORMAL HIGH (ref 70–99)
Glucose-Capillary: 88 mg/dL (ref 70–99)
Glucose-Capillary: 128 mg/dL — ABNORMAL HIGH (ref 70–99)
Glucose-Capillary: 91 mg/dL (ref 70–99)
Glucose-Capillary: 85 mg/dL (ref 70–99)

## 2021-07-17 LAB — CULTURE, BLOOD (ROUTINE X 2)
Culture: NO GROWTH
Culture: NO GROWTH
Special Requests: ADEQUATE

## 2021-07-17 LAB — VITAMIN B1: Vitamin B1 (Thiamine): 86.6 nmol/L (ref 66.5–200.0)

## 2021-07-17 LAB — SODIUM
Sodium: 127 mmol/L — ABNORMAL LOW (ref 135–145)
Sodium: 129 mmol/L — ABNORMAL LOW (ref 135–145)

## 2021-07-17 LAB — MRSA NEXT GEN BY PCR, NASAL: MRSA by PCR Next Gen: NOT DETECTED

## 2021-07-17 IMAGING — CT CT CHEST W/O CM
2 of 4 series · 15 of 36 positions shown, 18 images · non-contrast
Comparison: Chest CT [DATE].

CLINICAL DATA: Hemoptysis.



[Series 4: thorax 2.0 · axial · 0.71mm/px · z∈[+1168,+1472]mm · 12 of 170 slices shown, 15 images]
[im 9/170  mediastinal]
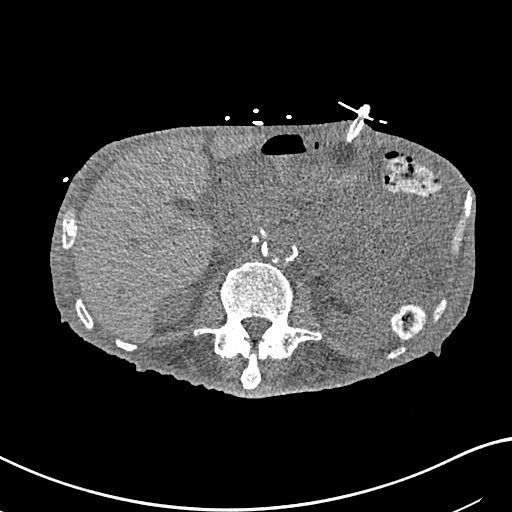
[im 9/170  lung]
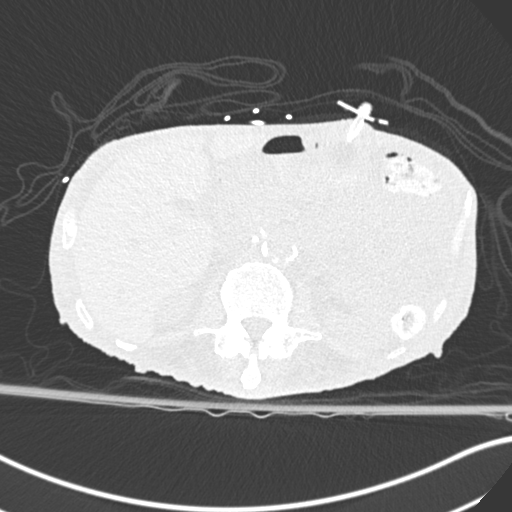
[im 27/170  lung]
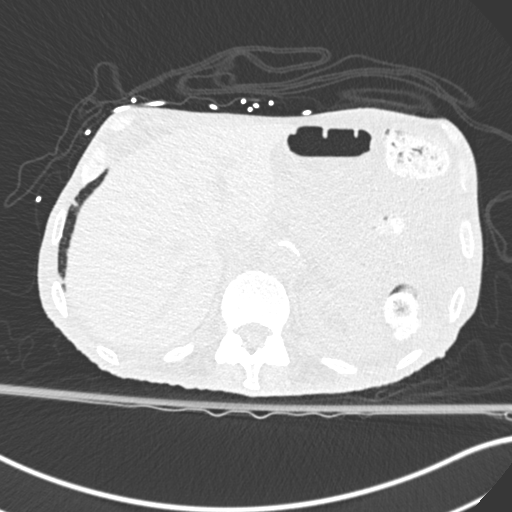
[im 36/170  lung]
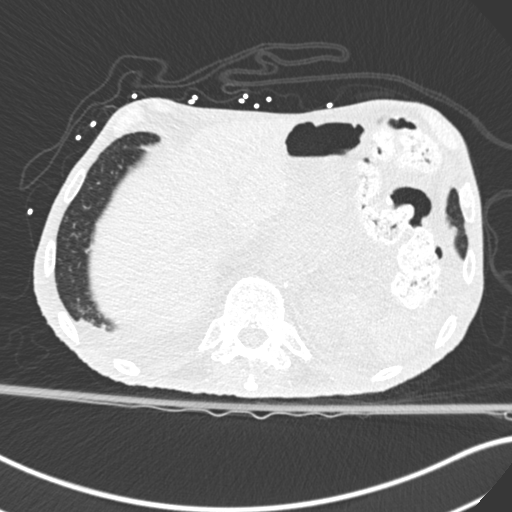
[im 54/170  lung]
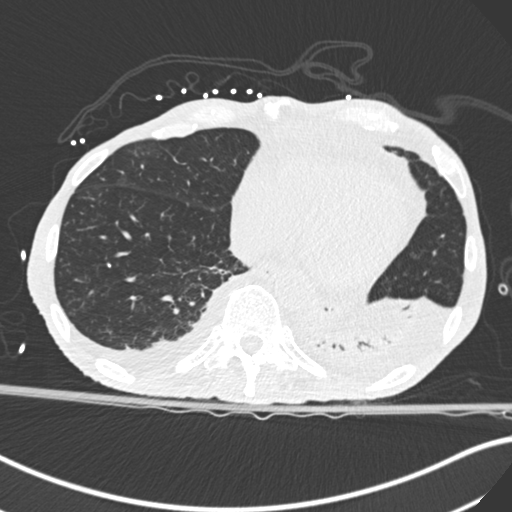
[im 63/170  mediastinal]
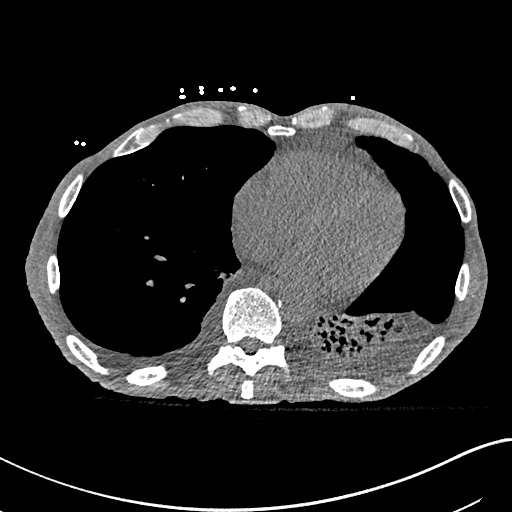
[im 63/170  lung]
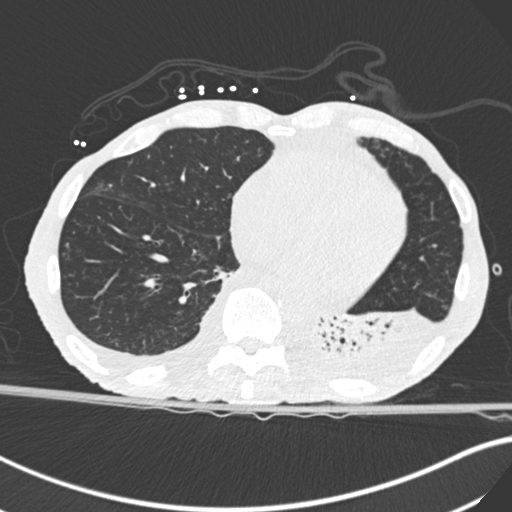
[im 81/170  lung]
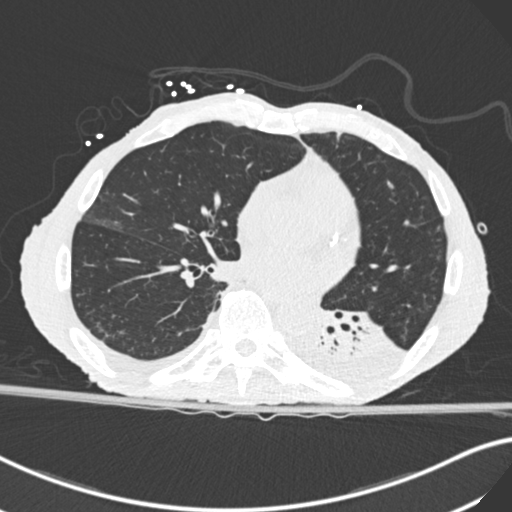
[im 89/170  lung]
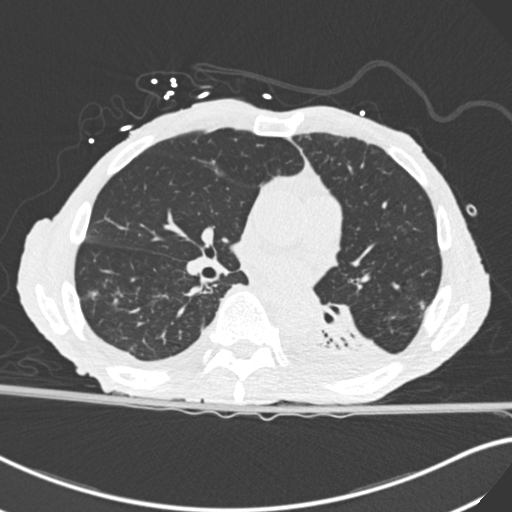
[im 107/170  lung]
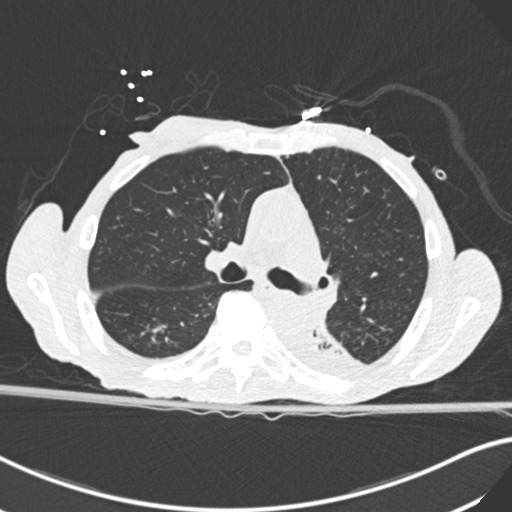
[im 116/170  mediastinal]
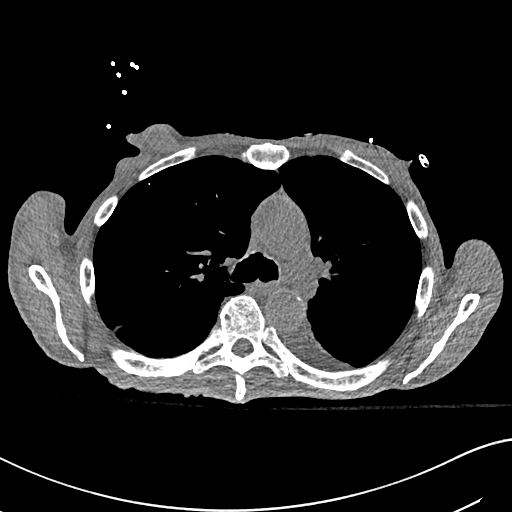
[im 116/170  lung]
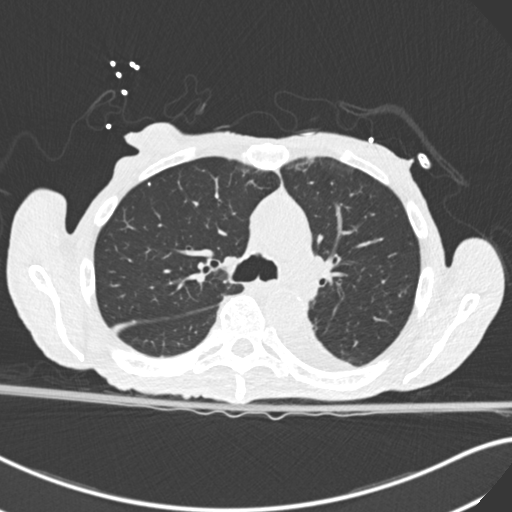
[im 134/170  lung]
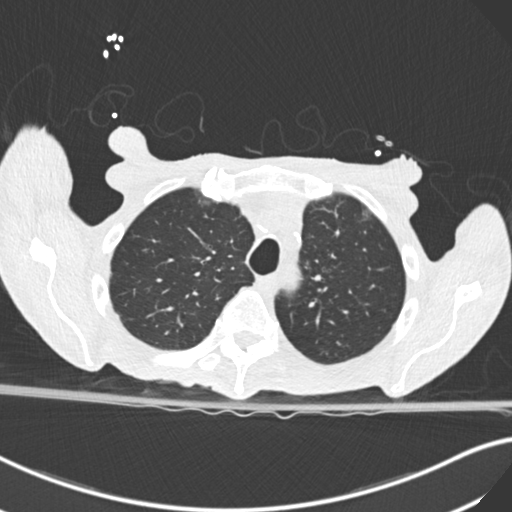
[im 143/170  lung]
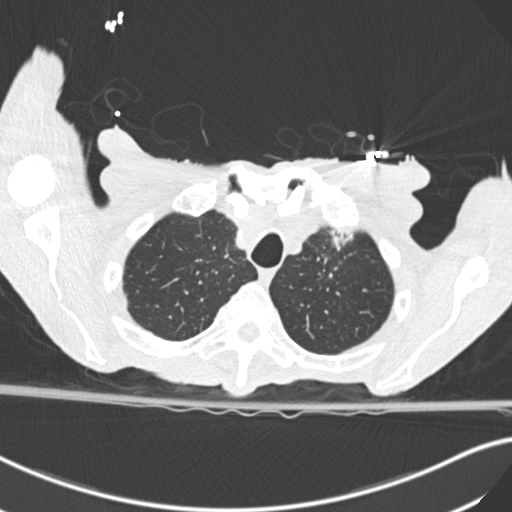
[im 161/170  lung]
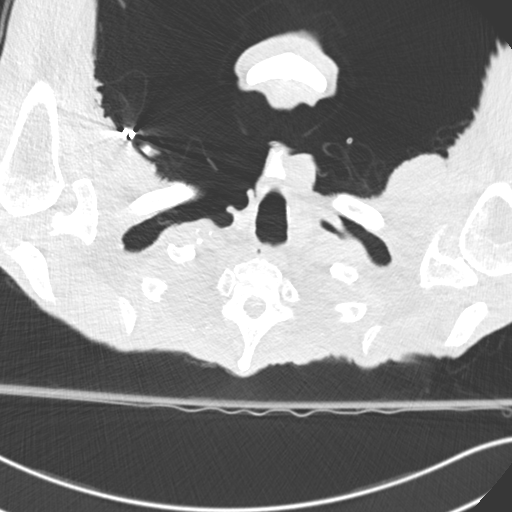

[Series 7: coronal · coronal · 0.68mm/px · 3 of 75 slices shown]
[im 15/75  lung]
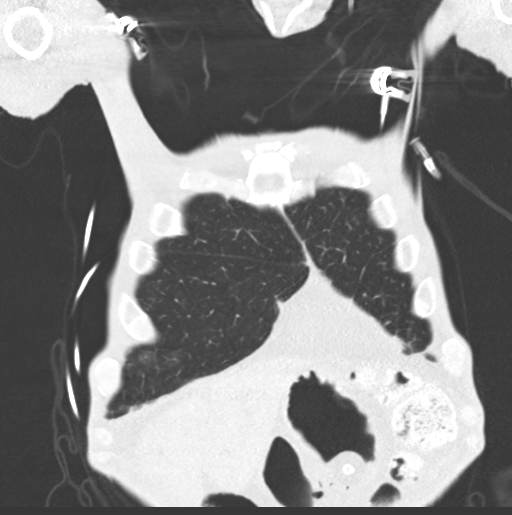
[im 30/75  lung]
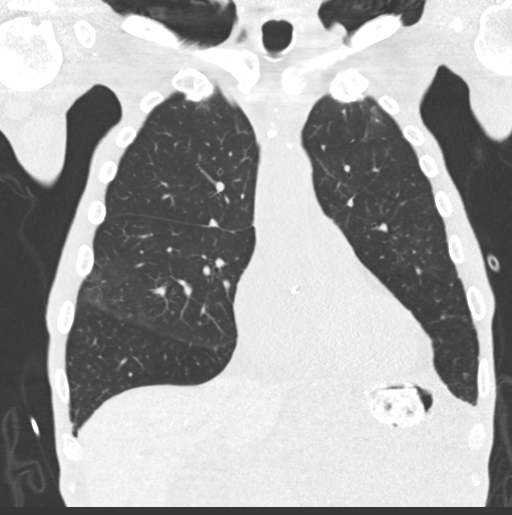
[im 45/75  lung]
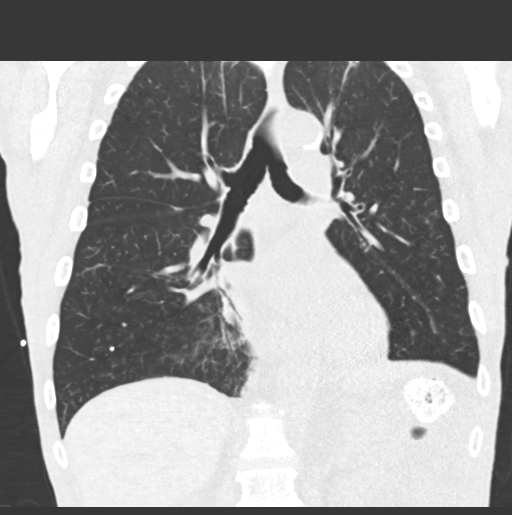

[15 of 36 positions shown; findings below may reference images not displayed]

FINDINGS: Cardiovascular: Heart and aorta are normal in size. There is no
pericardial effusion. There are atherosclerotic calcifications of
the aorta and coronary arteries.

Mediastinum/Nodes: No enlarged mediastinal or axillary lymph nodes.
Thyroid gland, trachea, and esophagus demonstrate no significant
findings.

Lungs/Pleura: Bilateral pleuroparenchymal scarring appears unchanged
from prior. There are small bilateral pleural effusions, left
greater than right. There is airspace consolidation with air
bronchograms throughout the entire right lower lobe. Tree-in-bud and
ill-defined nodular densities are seen in the right lower lobe and
left upper lobe. Calcified granuloma is noted in the right upper
lobe. No pneumothorax visualized. Trachea and central airways appear
patent.

Upper Abdomen: Percutaneous gastrostomy tube present.

Musculoskeletal: No acute fractures.
IMPRESSION: 1. 1. Airspace consolidation involving the entire left lower lobe
compatible with pneumonia. Follow-up imaging recommended in 4-6
weeks to confirm complete resolution and to exclude underlying
pathology.
2. Small bilateral pleural effusions.
3. Tree-in-bud and ill-defined nodular densities in the right lower
lobe and left upper lobe, likely infectious/inflammatory.
4. Aortic Atherosclerosis ([1N]-[1N]).

## 2021-07-17 MED ORDER — ALPRAZOLAM 0.25 MG PO TABS: 0.2500 mg | ORAL_TABLET | Freq: Three times a day (TID) | ORAL | Status: DC | PRN

## 2021-07-17 MED ORDER — TRAMADOL HCL 50 MG PO TABS
50.0000 mg | ORAL_TABLET | Freq: Three times a day (TID) | ORAL | Status: DC | PRN
  Administered 2021-07-17 – 2021-07-21 (×5): 50 mg
  Filled 2021-07-17 (×5): qty 1

## 2021-07-17 MED ORDER — VANCOMYCIN HCL 750 MG/150ML IV SOLN
750.0000 mg | Freq: Two times a day (BID) | INTRAVENOUS | Status: DC
  Administered 2021-07-17 (×2): 750 mg via INTRAVENOUS
  Filled 2021-07-17 (×3): qty 150

## 2021-07-17 MED ORDER — OXYCODONE HCL 5 MG/5ML PO SOLN
5.0000 mg | ORAL | Status: DC | PRN
  Administered 2021-07-23: 5 mg
  Filled 2021-07-17: qty 5

## 2021-07-17 MED ORDER — VANCOMYCIN HCL IN DEXTROSE 1-5 GM/200ML-% IV SOLN
1000.0000 mg | Freq: Once | INTRAVENOUS | Status: AC
Start: 2021-07-17 — End: 2021-07-17
  Administered 2021-07-17: 1000 mg via INTRAVENOUS
  Filled 2021-07-17: qty 200

## 2021-07-17 MED ORDER — SODIUM CHLORIDE 0.9 % IV SOLN
2.0000 g | Freq: Three times a day (TID) | INTRAVENOUS | Status: DC
  Administered 2021-07-17 – 2021-07-18 (×5): 2 g via INTRAVENOUS
  Filled 2021-07-17 (×5): qty 2

## 2021-07-17 NOTE — Progress Notes (Signed)
Pharmacy Antibiotic Note  Alexander Duran is a 76 y.o. male admitted on 07/12/2021 with dehydration, now with PNA .  Pharmacy has been consulted for Vancomycin  dosing.  Plan: Vancomycin 1000 mg IV now, then 750 mg IV q12h  Height: 5\' 6"  (167.6 cm) Weight: 55.6 kg (122 lb 9.2 oz) IBW/kg (Calculated) : 63.8  Temp (24hrs), Avg:97.4 F (36.3 C), Min:97.3 F (36.3 C), Max:97.5 F (36.4 C)  Recent Labs  Lab 07/12/21 2126 07/13/21 0338 07/13/21 1723 07/14/21 0303 07/14/21 0609 07/15/21 0412 07/16/21 0609  WBC 4.6 4.1  --   --  2.8* 4.2  --   CREATININE 0.62 0.59* 0.55*  --  0.63 0.49* 0.49*  LATICACIDVEN  --   --   --  0.5 0.7  --   --     Estimated Creatinine Clearance: 64.7 mL/min (A) (by C-G formula based on SCr of 0.49 mg/dL (L)).    Allergies  Allergen Reactions   Iodinated Contrast Media     Other reaction(s): Weakness present    Caryl Pina 07/17/2021 1:14 AM

## 2021-07-17 NOTE — Progress Notes (Addendum)
Palliative Care Progress Note, Assessment & Plan   Patient Name: Alexander Duran       Date: 07/17/2021 DOB: May 06, 1946  Age: 76 y.o. MRN#: [id number] Attending Physician: Hosie Poisson, MD Primary Care Physician: Windy Fast, MD Admit Date: 07/12/2021  Reason for Consultation/Follow-up: Establishing goals of care  Subjective: Patient is sitting in bed in no apparent distress.  Tube feeds running without complication.  She greets me and is able to make his wishes known.  He has no acute complaints at this time.  No family at bedside.  HPI: 76 y.o. 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    Of note, patient was recently hospitalized starting 06/17/2021 for COVID-19 infection, with associated positive COVID-19 test on the day of admission and admitted on 07/12/2021 with dehydration, diarrhea, generalized weakness, malnutrition, and others.  During the course of this hospitalization patient met with [facility] to discuss disposition options for palliative and hospice.  Patient has decided to be discharged with hospice services in the home.  Summary of counseling/coordination of care: After reviewing the patient's chart, I assessed the patient at bedside.  He remembers speaking with my colleague Randall Hiss as well as the [facility] nursing staff.  Patient shares his plan is to work with physical therapy and Occupational Therapy while completing IV antibiotics during this hospitalization.  He believes he will be discharged to home in 2 to 3 days at which time he would like to  begin care in the home with hospice services.  Patient has met with [facility] and made it clear that he would like to continue his tube feeds.  Patient understands that PT and OT will not be continued once he is in the home.  However, he shares he would like to have his best shot if feeling strong as possible before returning home.  Therapeutic silence and active listening provided for patient to share his thoughts and emotions regarding his current health status.  Patient shares he is taking control of his medical decisions and feels good in his heart and soul that he is making the right decision for himself.  He shares he may have weeks, months, or even a year to live, but he is doing it on his terms.  Questions and concerns were addressed.  Educated patient that I will make his goals and wishes known to the rest of the medical staff.   Goals are clear and plan is set. PMT will shadow chart for decline. Please reach out to PMT if pt declines, if patient requests, or if PMT needs to intervene.  Code Status: DNR  Prognosis: Unable to determine  Discharge Planning: Home with Hospice  Recommendations/Plan: Work with PT and OT while hospitalized Patient is NOT on comfort measures Patient plans to work with hospice at home once he is discharged Treat the treatable  Care plan was discussed with Dr. Karleen Hampshire, nursing, patient  Physical Exam Vitals and nursing note reviewed.  Constitutional:      General: He  is not in acute distress.    Appearance: Normal appearance. He is not toxic-appearing.  HENT:     Head: Normocephalic.     Nose: Nose normal.     Mouth/Throat:     Mouth: Mucous membranes are moist.  Eyes:     Pupils: Pupils are equal, round, and reactive to light.  Cardiovascular:     Rate and Rhythm: Normal rate.     Pulses: Normal pulses.  Pulmonary:     Effort: Pulmonary effort is normal.  Abdominal:     Palpations: Abdomen is soft.  Musculoskeletal:     Cervical back:  Normal range of motion.     Comments: Generalized weakness  Neurological:     Mental Status: He is alert and oriented to person, place, and time. Mental status is at baseline.  Psychiatric:        Mood and Affect: Mood normal.        Behavior: Behavior normal.        Thought Content: Thought content normal.        Judgment: Judgment normal.            Palliative Assessment/Data: 40%    Total Time 30 minutes  Greater than 50%  of this time was spent counseling and coordinating care related to the above assessment and plan.  Thank you for allowing the Palliative Medicine Team to assist in the care of this patient.  [place] Ilsa Iha, FNP-BC Palliative Medicine Team Team Phone # [phone]

## 2021-07-17 NOTE — Progress Notes (Addendum)
Nutrition Follow-up  DOCUMENTATION CODES:   Severe malnutrition in context of chronic illness  INTERVENTION:   Continue TF via PEG: Osmolite 1.5, increase to goal rate of 60 ml/h to provide 2160 kcal, 90 g protein, 1277 ml free water daily.   Free water flushes 30 ml every 4 hours for tube patency.  Continue MVI with minerals daily via tube.  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  Ongoing   GOAL:   Patient will meet greater than or equal to 90% of their needs  Progressing with TF advancement  MONITOR:   Labs, Weight trends, TF tolerance, Skin, I & O's  REASON FOR ASSESSMENT:   Consult Enteral/tube feeding initiation and management  ASSESSMENT:   76 y.o. 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  Starting antibiotics for LLL PNA today.  Per discussion with student RN, patient is tolerating TF well via PEG. Currently receiving Osmolite 1.5 at 40 ml/h (goal rate is 60 ml/h).   Remains NPO.  Patient states that he is going to transition to comfort care starting today. He will be receiving Hospice at home. He plans to continue TF via PEG at home. He was receiving Isosource TF formula at home PTA, but is glad to be getting Osmolite 1.5 now and when he goes home he will continue with this formula.   Labs reviewed. Na 127 Mag & Phos WNL 1/29 K WNL 1/30 CBG: 103-88-128  Medications reviewed and include Lasix, MVI with minerals, urea.  Patient meets criteria for severe malnutrition.   NUTRITION - FOCUSED PHYSICAL EXAM:  Flowsheet Row Most Recent Value  Orbital Region Moderate  depletion  Upper Arm Region Severe depletion  Thoracic and Lumbar Region Severe depletion  Buccal Region Moderate depletion  Temple Region Moderate depletion  Clavicle Bone Region Severe depletion  Clavicle and Acromion Bone Region Severe depletion  Scapular Bone Region Severe depletion  Dorsal Hand Moderate depletion  Patellar Region Severe depletion  Anterior Thigh Region Severe depletion  Posterior Calf Region Severe depletion  Edema (RD Assessment) None  Hair Reviewed  Eyes Reviewed  Mouth Reviewed  Skin Reviewed  Nails Reviewed       Diet Order:   Diet Order             Diet NPO time specified  Diet effective now                   EDUCATION NEEDS:   No education needs have been identified at this time  Skin:  Skin Assessment: Skin Integrity Issues: Skin Integrity Issues:: Stage II Stage II: sacrum  Last BM:  1/31 type 7  Height:   Ht Readings from Last 1 Encounters:  07/14/21 5\' 6"  (1.676 m)    Weight:   Wt Readings from Last 1 Encounters:  07/15/21 55.6 kg    Ideal Body Weight:  64.5 kg  BMI:  Body mass index is 19.78 kg/m.  Estimated Nutritional Needs:   Kcal:  1800-2100kcal/day  Protein:  90-105g/day  Fluid:  1.6-1.8L/day    Lucas Mallow RD, LDN, CNSC Please refer to Amion for contact information.

## 2021-07-17 NOTE — Progress Notes (Signed)
Alexander Duran [phone] [facility])  Hospital Liaison RN note   Received request from [facility], [name] for hospice services at home after discharge. Chart and patient information reviewed by Hospice physician. Hospice eligibility confirmed.  Liaison spoke with patient yesterday to explain services and answer questions.   Alexander Duran [phone] [facility])  Hospital Liaison RN note   Received request from [facility], [name] for hospice services at home after discharge. Chart and patient information under review by Hospice physician. Hospice eligibility pending at this time.   A Please do not hesitate to call with questions.    Thank you,    Farrel Gordon, RN, [place] (listed on [facility] under [place])     [phone]

## 2021-07-17 NOTE — Progress Notes (Signed)
Nephrology Follow-Up Consult note   Assessment/Recommendations: Alexander Duran is a/an 76 y.o. 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     Dehydration: Secondary to diarrhea.  Now is improved  Pneumonia: Visualized on CT scan.  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  Sodium relatively stable 127.  Continue with fluid restriction and Lasix 10 mg twice daily.  Continue urea to 30 g twice daily.  Goal sodium 133 by tomorrow morning.  Monitor sodium twice daily  Generalized weakness/Frustration: Management per primary team.  Agree with palliative care involvement  Hypothyroidism: TSH high here.  Synthroid recently increased  Hyperlipidemia: Statin     Recommendations conveyed to primary service.    [facility] 07/17/2021 9:33 AM  ___________________________________________________________  CC: Hyponatremia  Interval History/Subjective: Sodium almost exactly the same at 127.  Urine output continues to be robust.  CT chest with concern for pneumonia.  Started on antibiotics overnight.  Patient is fairly frustrated this morning.  He feels like people are being rough with him coming to get him in the middle of the night to do CT scans.  He says he felt a lot better when his sodium was lower because he could drink as much as he wanted.  He is frustrated with his current medical care and feels like a "lab rat."  Medications:  Current Facility-Administered Medications  Medication Dose Route Frequency Provider Last Rate Last Admin   acetaminophen (TYLENOL) tablet 650 mg  650 mg Per Tube Q6H PRN Howerter, Justin B, DO   650 mg at 07/16/21 2144   Or   acetaminophen (TYLENOL) suppository 650 mg  650 mg Rectal Q6H PRN Howerter, Justin B, DO       ALPRAZolam  Duanne Moron) tablet 0.25 mg  0.25 mg Per Tube BID PRN Hosie Poisson, MD   0.25 mg at 07/16/21 2144   aspirin chewable tablet 81 mg  81 mg Per Tube QPM Howerter, Justin B, DO   81 mg at 07/16/21 1724   atorvastatin (LIPITOR) tablet 20 mg  20 mg Per Tube Daily Howerter, Justin B, DO   20 mg at 07/17/21 1884   ceFEPIme (MAXIPIME) 2 g in sodium chloride 0.9 % 100 mL IVPB  2 g Intravenous Q8H Chotiner, Yevonne Aline, MD 200 mL/hr at 07/17/21 0807 2 g at 07/17/21 0807   clopidogrel (PLAVIX) tablet 75 mg  75 mg Per Tube Daily Hosie Poisson, MD   75 mg at 07/16/21 0921   feeding supplement (OSMOLITE 1.5 CAL) liquid 1,000 mL  1,000 mL Per Tube Continuous Hosie Poisson, MD 60 mL/hr at 07/17/21 0802 1,000 mL at 07/17/21 0802   free water 30 mL  30 mL Per Tube Q4H Hosie Poisson, MD   30 mL at 07/17/21 0826   furosemide (LASIX) tablet 10 mg  10 mg Per Tube BID Roney Jaffe, MD   10 mg at 07/17/21 1660   gabapentin (NEURONTIN) 250 MG/5ML solution 100 mg  100 mg Per Tube BID Hosie Poisson, MD   100 mg at 07/16/21 2100   heparin injection 5,000 Units  5,000 Units Subcutaneous Q8H Hosie Poisson, MD   5,000 Units at 07/17/21 0700   levothyroxine (SYNTHROID) tablet 100 mcg  100 mcg Per Tube QAC breakfast Hosie Poisson, MD   100 mcg at 07/17/21 0700   loperamide HCl (IMODIUM) 1 MG/7.5ML  suspension 2 mg  2 mg Per Tube PRN Hosie Poisson, MD       loratadine (CLARITIN) tablet 10 mg  10 mg Per Tube Daily Howerter, Justin B, DO   10 mg at 07/17/21 9233   midodrine (PROAMATINE) tablet 5 mg  5 mg Per Tube TID WC Howerter, Justin B, DO   5 mg at 07/16/21 1208   morphine 2 MG/ML injection 2 mg  2 mg Intravenous Q2H PRN Howerter, Justin B, DO       multivitamin with minerals tablet 1 tablet  1 tablet Per Tube Daily Hosie Poisson, MD   1 tablet at 07/17/21 0808   traMADol (ULTRAM) tablet 50 mg  50 mg Per Tube Q8H PRN Georgette Shell, MD   50 mg at 07/17/21 0857   urea (URE-NA) oral packet 30 g  30 g Per Tube BID Reesa Chew,  MD   30 g at 07/17/21 0807   vancomycin (VANCOREADY) IVPB 750 mg/150 mL  750 mg Intravenous Q12H Hosie Poisson, MD          Review of Systems: 10 systems reviewed and negative except per interval history/subjective  Physical Exam: Vitals:   07/17/21 0016 07/17/21 0754  BP: 109/66 116/73  Pulse: (!) 55 67  Resp: 16 14  Temp: 97.6 F (36.4 C) (!) 97.4 F (36.3 C)  SpO2: 100% 100%   Total I/O In: -  Out: 1200 [Urine:1200]  Intake/Output Summary (Last 24 hours) at 07/17/2021 0076 Last data filed at 07/17/2021 2263 Gross per 24 hour  Intake 479.34 ml  Output 3800 ml  Net -3320.66 ml   Constitutional: Chronically ill-appearing, lying in bed, no distress ENMT: ears and nose without scars or lesions, dry mucous membranes CV: normal rate, no edema Respiratory: Bilateral chest rise, normal work of breathing Gastrointestinal: soft, non-tender, bowel sounds present Skin: no visible lesions or rashes Psych: alert, angry mood appropriate affect   Test Results I personally reviewed new and old clinical labs and radiology tests Lab Results  Component Value Date   NA 127 (L) 07/17/2021   K 4.3 07/16/2021   CL 91 (L) 07/16/2021   CO2 29 07/16/2021   BUN 23 07/16/2021   CREATININE 0.49 (L) 07/16/2021   CALCIUM 8.4 (L) 07/16/2021   ALBUMIN 2.8 (L) 07/13/2021   PHOS 3.1 07/15/2021

## 2021-07-17 NOTE — Progress Notes (Signed)
PROGRESS NOTE    Alexander Duran  UXN:[id number] DOB: [birth year] DOA: 07/12/2021 PCP: Windy Fast, MD    Chief Complaint  Patient presents with   Diarrhea    Brief Narrative:  Alexander Duran is a 76 y.o. 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  Of note, patient was recently hospitalized starting 06/17/2021 for COVID-19 infection, with associated positive COVID-19 test on the day of admission. On the day of admission, pt had trouble with left arm numbness and a code stroke was called. MRI of the brain was negative for acute stroke, pt has chronic right ICA stenosis.  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  Palliative care consulted in view of his recurrent admissions, increased debility and deconditioning. He wanted to go home initially with hospice but now he would like to go home with comfort measures in place. At the same time, he is also requesting to work with PT/OT and get strong enough to get out of bed and walk before going home.     Assessment & Plan:   Principal Problem:   Dehydration Active Problems:   Hypothyroidism   Diarrhea   Generalized weakness   Acute hyponatremia   HLD (hyperlipidemia)   Pressure injury of skin   Dehydration probably from diarrhea . Pt reports at home he was constipated and he was asked to increased the rate of the tube feeds and take stool softeners and laxatives. Since then he had been having multiple episodes of diarrhea.  No diarrhea so far.  Since diarrhea has improved, tube feeds were restarted.    Generalized weakness with some tingling and  numbness in the fingers MRI of the brain is negative for acute intracranial abnormalities or acute stroke. 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 Neurology consulted recommended adding Plavix to the aspirin and statin. We have added gabapentin to help with neuropathic pain.  Pt reports the tingling and numbness improved.     Hypothermia, hypotension, SIRS / Left lower lobe pneumonia Hypothermia improved.  BP parameters improved with fluid boluses.  CXR this morning showing left lower lobe consolidations. Started the patient on IV rocephin and zithromax. On 1/30 pt started cough with blood tinged sputum. CT chest showed . Airspace consolidation involving the entire left lower lobe compatible with pneumonia. Tree-in-bud and ill-defined nodular densities in the right lower lobe and left upper lobe, likely infectious/inflammatory. Transitioned to IV vancomycin and IV cefepime.  Unclear if he has post covid pneumonia.  COVID-19 PCR is negative.  Urinalysis is negative for infection.    Hyponatremia Probably initially  secondary to hypovolemia from dehydration in the setting of GI losses in the form of diarrhea. ? A component of SIADH. In view of his left lower lung pneumonia.  Differential include SIADH in the context of recent COVID-19 infection. Continue with fluid restriction, continue with urea and lasix . Sodium remains stable around 127.  Appreciate nephrology recommendations and following.  Tsh elevated. Cortisol is wnl this morning.  Continue to monitor.   Hypothyroidism TSH high, low free t3, free t4 level normal. Pt was on synthroid 75  mcg daily.  Increased synthroid to 100 mcg daily.    Hyperlipidemia Continue with statin.    Pressure injury present on admission.  Pressure Injury 07/13/21 Sacrum Medial Stage 2 -  Partial thickness loss of dermis presenting as a shallow open injury with a red, pink wound bed  without slough. (Active)  07/13/21 0000  Location: Sacrum  Location Orientation: Medial  Staging: Stage 2 -  Partial thickness loss of dermis presenting as a shallow open injury with a red, pink wound bed without slough.  Wound Description (Comments):   Present on Admission: Yes   Wound care consulted.    Nutrition:  Dietary consulted for initiation of tube feeds.    Normocytic anemia Anemia of chronic diseas;  Hemoglobin around 10.9.    Hypoglycemia:  - unclear , asymptomatic. On tube feeds. Will continue to monitor.   In view of deconditioning, recurrent admissions, palliative care consulted for goals of care discussions.    DVT prophylaxis: Heparin.  Code Status: DNR  Family Communication: none at bedside.  Disposition:   Status is: Inpatient  Remains inpatient appropriate because: hyponatremia.        Consultants:  Neurology.   Procedures: CTA head and neck.  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 Severe left neural foraminal stenosis at C3-4 and moderate bilateral neural foraminal stenosis at C4-5.  MRI brain:  Chronic right ICA occlusion.   Antimicrobials: Antibiotics Given (last 72 hours)     Date/Time Action Medication Dose Rate   07/15/21 0053 New Bag/Given   cefTRIAXone (ROCEPHIN) 1 g in sodium chloride 0.9 % 100 mL IVPB 1 g 200 mL/hr   07/15/21 1138 New Bag/Given   azithromycin (ZITHROMAX) 500 mg in sodium chloride 0.9 % 250 mL IVPB 500 mg 250 mL/hr   07/15/21 2138 New Bag/Given   cefTRIAXone (ROCEPHIN) 1 g in sodium chloride 0.9 % 100 mL IVPB 1 g 200 mL/hr   07/16/21 9833 New Bag/Given   azithromycin (ZITHROMAX) 500 mg in sodium chloride 0.9 % 250 mL IVPB 500 mg 250 mL/hr   07/16/21 2134 New Bag/Given   cefTRIAXone (ROCEPHIN) 1 g in sodium chloride 0.9 % 100 mL IVPB 1 g 200 mL/hr   07/17/21 0148 New Bag/Given   ceFEPIme  (MAXIPIME) 2 g in sodium chloride 0.9 % 100 mL IVPB 2 g 200 mL/hr   07/17/21 0303 New Bag/Given   vancomycin (VANCOCIN) IVPB 1000 mg/200 mL premix 1,000 mg 200 mL/hr   07/17/21 8250 New Bag/Given   ceFEPIme (MAXIPIME) 2 g in sodium chloride 0.9 % 100 mL IVPB 2 g 200 mL/hr   07/17/21 1049 New Bag/Given   vancomycin (VANCOREADY) IVPB 750 mg/150 mL 750 mg 150 mL/hr       Subjective: Pt reports pain is controlled with tramadol.   Objective: Vitals:   07/16/21 2000 07/17/21 0016 07/17/21 0754 07/17/21 1208  BP: (!) 141/79 109/66 116/73 (!) 143/70  Pulse: 63 (!) 55 67   Resp: 19 16 14    Temp: (!) 97.3 F (36.3 C) 97.6 F (36.4 C) (!) 97.4 F (36.3 C) (!) 97.3 F (36.3 C)  TempSrc: Oral Oral Oral Oral  SpO2: 100% 100% 100%   Weight:      Height:        Intake/Output Summary (Last 24 hours) at 07/17/2021 1308 Last data filed at 07/17/2021 1000 Gross per 24 hour  Intake 519.34 ml  Output 3400 ml  Net -2880.66 ml    Filed Weights   07/14/21 1000 07/15/21 0429  Weight: 59.2 kg 55.6 kg    Examination:  General exam: Appears calm and comfortable  Respiratory system: diminished air entry at bases, on 2 lit of [place] oxygen.  Cardiovascular system: S1 & S2 heard, RRR. No JVD,  No pedal edema. Gastrointestinal system: Abdomen is nondistended, soft and nontender.  Normal bowel sounds heard. Central nervous system: Alert and oriented. No focal neurological deficits. Extremities: Symmetric 5 x 5 power. Skin: No rashes, lesions or ulcers Psychiatry:  Mood & affect appropriate.         Data Reviewed: I have personally reviewed following labs and imaging studies  CBC: Recent Labs  Lab 07/12/21 2126 07/13/21 0338 07/14/21 0609 07/15/21 0412  WBC 4.6 4.1 2.8* 4.2  NEUTROABS 3.7 2.9  --   --   HGB 10.9* 9.4* 9.7* 10.9*  HCT 31.9* 26.9* 27.3* 32.0*  MCV 90.6 90.6 90.4 90.4  PLT 356 323 297 371     Basic Metabolic Panel: Recent Labs  Lab 07/12/21 2126 07/13/21 0338  07/13/21 1723 07/13/21 1829 07/14/21 0609 07/14/21 1232 07/15/21 0412 07/15/21 1231 07/15/21 2109 07/16/21 0609 07/16/21 1237 07/16/21 2145 07/17/21 0447  NA 125* 125* 124*   < > 127*   < > 127*   < > 124* 127* 126* 127* 127*  K 4.8 4.5 4.3  --  4.2  --  3.8  --   --  4.3  --   --   --   CL 87* 93* 92*  --  96*  --  95*  --   --  91*  --   --   --   CO2 29 25 24   --  21*  --  27  --   --  29  --   --   --   GLUCOSE 95 55* 61*  --  43*  --  86  --   --  93  --   --   --   BUN 20 16 13   --  15  --  9  --   --  23  --   --   --   CREATININE 0.62 0.59* 0.55*  --  0.63  --  0.49*  --   --  0.49*  --   --   --   CALCIUM 9.3 8.5* 8.5*  --  8.1*  --  8.0*  --   --  8.4*  --   --   --   MG 2.1 1.8  --   --   --   --  1.7  --   --   --   --   --   --   PHOS  --  3.0  --   --   --   --  3.1  --   --   --   --   --   --    < > = values in this interval not displayed.     GFR: Estimated Creatinine Clearance: 64.7 mL/min (A) (by C-G formula based on SCr of 0.49 mg/dL (L)).  Liver Function Tests: Recent Labs  Lab 07/12/21 2126 07/13/21 0338  AST 42* 34  ALT 32 27  ALKPHOS 74 59  BILITOT 0.6 0.4  PROT 6.5 5.3*  ALBUMIN 3.3* 2.8*     CBG: Recent Labs  Lab 07/16/21 2034 07/16/21 2357 07/17/21 0437 07/17/21 0752 07/17/21 1207  GLUCAP 105*  91 103* 88 128*      Recent Results (from the past 240 hour(s))  Blood culture (routine x 2)     Status: None (Preliminary result)   Collection Time: 07/12/21  9:35 PM   Specimen: BLOOD  Result Value Ref Range Status   Specimen Description BLOOD SITE NOT SPECIFIED  Final   Special Requests   Final    BOTTLES DRAWN AEROBIC AND ANAEROBIC Blood Culture adequate volume   Culture   Final    NO GROWTH 4 DAYS Performed at [facility], [address], [place], [place] [address]    Report Status PENDING  Incomplete  Resp Panel by RT-PCR (Flu A&B, Covid) Nasopharyngeal Swab     Status: None   Collection Time: 07/12/21  9:37 PM    Specimen: Nasopharyngeal Swab; Nasopharyngeal(NP) swabs in vial transport medium  Result Value Ref Range Status   SARS Coronavirus 2 by RT PCR NEGATIVE NEGATIVE Final    Comment: (NOTE) SARS-CoV-2 target nucleic acids are NOT DETECTED.  The SARS-CoV-2 RNA is generally detectable in upper respiratory specimens during the acute phase of infection. The lowest concentration of SARS-CoV-2 viral copies this assay can detect is 138 copies/mL. A negative result does not preclude SARS-Cov-2 infection and should not be used as the sole basis for treatment or other patient management decisions. A negative result may occur with  improper specimen collection/handling, submission of specimen other than nasopharyngeal swab, presence of viral mutation(s) within the areas targeted by this assay, and inadequate number of viral copies(<138 copies/mL). A negative result must be combined with clinical observations, patient history, and epidemiological information. The expected result is Negative.  Fact Sheet for Patients:  EntrepreneurPulse.com.au  Fact Sheet for Healthcare Providers:  IncredibleEmployment.be  This test is no t yet approved or cleared by the [place] FDA and  has been authorized for detection and/or diagnosis of SARS-CoV-2 by FDA under an Emergency Use Authorization (EUA). This EUA will remain  in effect (meaning this test can be used) for the duration of the COVID-19 declaration under Section 564(b)(1) of the Act, 21 U.S.C.section 360bbb-3(b)(1), unless the authorization is terminated  or revoked sooner.       Influenza A by PCR NEGATIVE NEGATIVE Final   Influenza B by PCR NEGATIVE NEGATIVE Final    Comment: (NOTE) The Xpert Xpress SARS-CoV-2/FLU/RSV plus assay is intended as an aid in the diagnosis of influenza from Nasopharyngeal swab specimens and should not be used as a sole basis for treatment. Nasal washings and aspirates are  unacceptable for Xpert Xpress SARS-CoV-2/FLU/RSV testing.  Fact Sheet for Patients: EntrepreneurPulse.com.au  Fact Sheet for Healthcare Providers: IncredibleEmployment.be  This test is not yet approved or cleared by the [place] FDA and has been authorized for detection and/or diagnosis of SARS-CoV-2 by FDA under an Emergency Use Authorization (EUA). This EUA will remain in effect (meaning this test can be used) for the duration of the COVID-19 declaration under Section 564(b)(1) of the Act, 21 U.S.C. section 360bbb-3(b)(1), unless the authorization is terminated or revoked.  Performed at [facility], [place] [address], [place], [place] [address]   Blood culture (routine x 2)     Status: None (Preliminary result)   Collection Time: 07/12/21  9:40 PM   Specimen: BLOOD RIGHT FOREARM  Result Value Ref Range Status   Specimen Description BLOOD RIGHT FOREARM  Final   Special Requests   Final    BOTTLES DRAWN AEROBIC AND ANAEROBIC Blood Culture results may  not be optimal due to an inadequate volume of blood received in culture bottles   Culture   Final    NO GROWTH 4 DAYS Performed at [facility], [place] [address], [place], [place] [address]    Report Status PENDING  Incomplete          Radiology Studies: CT CHEST WO CONTRAST  Result Date: 07/17/2021 CLINICAL DATA:  Hemoptysis. EXAM: CT CHEST WITHOUT CONTRAST TECHNIQUE: Multidetector CT imaging of the chest was performed following the standard protocol without IV contrast. RADIATION DOSE REDUCTION: This exam was performed according to the departmental dose-optimization program which includes automated exposure control, adjustment of the mA and/or kV according to patient size and/or use of iterative reconstruction technique. COMPARISON:  Chest CT 03/09/2021. FINDINGS: Cardiovascular: Heart and aorta are normal in size. There is no pericardial effusion. There are atherosclerotic  calcifications of the aorta and coronary arteries. Mediastinum/Nodes: No enlarged mediastinal or axillary lymph nodes. Thyroid gland, trachea, and esophagus demonstrate no significant findings. Lungs/Pleura: Bilateral pleuroparenchymal scarring appears unchanged from prior. There are small bilateral pleural effusions, left greater than right. There is airspace consolidation with air bronchograms throughout the entire right lower lobe. Tree-in-bud and ill-defined nodular densities are seen in the right lower lobe and left upper lobe. Calcified granuloma is noted in the right upper lobe. No pneumothorax visualized. Trachea and central airways appear patent. Upper Abdomen: Percutaneous gastrostomy tube present. Musculoskeletal: No acute fractures. IMPRESSION: 1. 1. Airspace consolidation involving the entire left lower lobe compatible with pneumonia. Follow-up imaging recommended in 4-6 weeks to confirm complete resolution and to exclude underlying pathology. 2. Small bilateral pleural effusions. 3. Tree-in-bud and ill-defined nodular densities in the right lower lobe and left upper lobe, likely infectious/inflammatory. 4. Aortic Atherosclerosis (ICD10-I70.0). Electronically Signed   By: Ronney Asters M.D.   On: 07/17/2021 00:54        Scheduled Meds:  aspirin  81 mg Per Tube QPM   atorvastatin  20 mg Per Tube Daily   clopidogrel  75 mg Per Tube Daily   free water  30 mL Per Tube Q4H   furosemide  10 mg Per Tube BID   gabapentin  100 mg Per Tube BID   heparin injection (subcutaneous)  5,000 Units Subcutaneous Q8H   levothyroxine  100 mcg Per Tube QAC breakfast   loratadine  10 mg Per Tube Daily   midodrine  5 mg Per Tube TID WC   multivitamin with minerals  1 tablet Per Tube Daily   urea  30 g Per Tube BID   Continuous Infusions:  ceFEPime (MAXIPIME) IV 2 g (07/17/21 0807)   feeding supplement (OSMOLITE 1.5 CAL) 1,000 mL (07/17/21 0802)   vancomycin 750 mg (07/17/21 1049)     LOS: 4 days         Hosie Poisson, MD [facility]   To contact the attending provider between 7A-7P or the covering provider during after hours 7P-7A, please log into the web site [email or web address] and access using universal [place] password for that web site. If you do not have the password, please call the hospital operator.  07/17/2021, 1:08 PM

## 2021-07-17 NOTE — Progress Notes (Signed)
Notified by Radiology that CT scan of chest shows LLL pneumonia.   Placed on cefepime and vancomycin

## 2021-07-18 DIAGNOSIS — R531 Weakness: Secondary | ICD-10-CM | POA: Diagnosis not present

## 2021-07-18 DIAGNOSIS — R197 Diarrhea, unspecified: Secondary | ICD-10-CM | POA: Diagnosis not present

## 2021-07-18 DIAGNOSIS — E871 Hypo-osmolality and hyponatremia: Secondary | ICD-10-CM | POA: Diagnosis not present

## 2021-07-18 DIAGNOSIS — E860 Dehydration: Secondary | ICD-10-CM | POA: Diagnosis not present

## 2021-07-18 LAB — GLUCOSE, CAPILLARY
Glucose-Capillary: 113 mg/dL — ABNORMAL HIGH (ref 70–99)
Glucose-Capillary: 114 mg/dL — ABNORMAL HIGH (ref 70–99)
Glucose-Capillary: 120 mg/dL — ABNORMAL HIGH (ref 70–99)
Glucose-Capillary: 62 mg/dL — ABNORMAL LOW (ref 70–99)
Glucose-Capillary: 65 mg/dL — ABNORMAL LOW (ref 70–99)
Glucose-Capillary: 78 mg/dL (ref 70–99)
Glucose-Capillary: 91 mg/dL (ref 70–99)
Glucose-Capillary: 93 mg/dL (ref 70–99)

## 2021-07-18 LAB — CBC WITH DIFFERENTIAL/PLATELET
Abs Immature Granulocytes: 0.02 10*3/uL (ref 0.00–0.07)
Basophils Absolute: 0 10*3/uL (ref 0.0–0.1)
Basophils Relative: 1 %
Eosinophils Absolute: 0.2 10*3/uL (ref 0.0–0.5)
Eosinophils Relative: 5 %
HCT: 28.8 % — ABNORMAL LOW (ref 39.0–52.0)
Hemoglobin: 10.1 g/dL — ABNORMAL LOW (ref 13.0–17.0)
Immature Granulocytes: 1 %
Lymphocytes Relative: 13 %
Lymphs Abs: 0.4 10*3/uL — ABNORMAL LOW (ref 0.7–4.0)
MCH: 31.9 pg (ref 26.0–34.0)
MCHC: 35.1 g/dL (ref 30.0–36.0)
MCV: 90.9 fL (ref 80.0–100.0)
Monocytes Absolute: 0.6 10*3/uL (ref 0.1–1.0)
Monocytes Relative: 16 %
Neutro Abs: 2.1 10*3/uL (ref 1.7–7.7)
Neutrophils Relative %: 64 %
Platelets: 297 10*3/uL (ref 150–400)
RBC: 3.17 MIL/uL — ABNORMAL LOW (ref 4.22–5.81)
RDW: 16 % — ABNORMAL HIGH (ref 11.5–15.5)
WBC: 3.4 10*3/uL — ABNORMAL LOW (ref 4.0–10.5)
nRBC: 0 % (ref 0.0–0.2)

## 2021-07-18 LAB — BASIC METABOLIC PANEL
Anion gap: 8 (ref 5–15)
Anion gap: 7 (ref 5–15)
BUN: 57 mg/dL — ABNORMAL HIGH (ref 8–23)
BUN: 52 mg/dL — ABNORMAL HIGH (ref 8–23)
CO2: 28 mmol/L (ref 22–32)
CO2: 32 mmol/L (ref 22–32)
Calcium: 8.4 mg/dL — ABNORMAL LOW (ref 8.9–10.3)
Calcium: 8.7 mg/dL — ABNORMAL LOW (ref 8.9–10.3)
Chloride: 92 mmol/L — ABNORMAL LOW (ref 98–111)
Chloride: 92 mmol/L — ABNORMAL LOW (ref 98–111)
Creatinine, Ser: 0.51 mg/dL — ABNORMAL LOW (ref 0.61–1.24)
Creatinine, Ser: 0.5 mg/dL — ABNORMAL LOW (ref 0.61–1.24)
GFR, Estimated: >60 mL/min (ref >60)
GFR, Estimated: >60 mL/min (ref >60)
Glucose, Bld: 79 mg/dL (ref 70–99)
Glucose, Bld: 96 mg/dL (ref 70–99)
Potassium: 4.4 mmol/L (ref 3.5–5.1)
Potassium: 4.3 mmol/L (ref 3.5–5.1)
Sodium: 128 mmol/L — ABNORMAL LOW (ref 135–145)
Sodium: 131 mmol/L — ABNORMAL LOW (ref 135–145)

## 2021-07-18 LAB — CBC
HCT: 30.2 % — ABNORMAL LOW (ref 39.0–52.0)
Hemoglobin: 10.5 g/dL — ABNORMAL LOW (ref 13.0–17.0)
MCH: 31.5 pg (ref 26.0–34.0)
MCHC: 34.8 g/dL (ref 30.0–36.0)
MCV: 90.7 fL (ref 80.0–100.0)
Platelets: 319 10*3/uL (ref 150–400)
RBC: 3.33 MIL/uL — ABNORMAL LOW (ref 4.22–5.81)
RDW: 16.1 % — ABNORMAL HIGH (ref 11.5–15.5)
WBC: 3.4 10*3/uL — ABNORMAL LOW (ref 4.0–10.5)
nRBC: 0 % (ref 0.0–0.2)

## 2021-07-18 LAB — OCCULT BLOOD X 1 CARD TO LAB, STOOL: Fecal Occult Bld: POSITIVE — AB

## 2021-07-18 MED ORDER — SODIUM CHLORIDE 0.9 % IV SOLN
1.0000 g | INTRAVENOUS | Status: DC
  Administered 2021-07-18 – 2021-07-22 (×5): 1 g via INTRAVENOUS
  Filled 2021-07-18 (×5): qty 10

## 2021-07-18 MED ORDER — SODIUM CHLORIDE 0.9 % IV BOLUS
500.0000 mL | Freq: Once | INTRAVENOUS | Status: AC
  Administered 2021-07-18: 500 mL via INTRAVENOUS

## 2021-07-18 MED ORDER — LOPERAMIDE HCL 2 MG PO CAPS: 2.0000 mg | ORAL_CAPSULE | Freq: Every day | ORAL | Status: DC

## 2021-07-18 MED ORDER — DEXTROSE 50 % IV SOLN
12.5000 g | INTRAVENOUS | Status: AC
  Administered 2021-07-18: 12.5 g via INTRAVENOUS

## 2021-07-18 MED ORDER — PANTOPRAZOLE SODIUM 40 MG IV SOLR
40.0000 mg | Freq: Two times a day (BID) | INTRAVENOUS | Status: DC
  Administered 2021-07-18 – 2021-07-27 (×17): 40 mg via INTRAVENOUS
  Filled 2021-07-18 (×5): qty 40
  Filled 2021-07-18: qty 10
  Filled 2021-07-18 (×2): qty 40
  Filled 2021-07-18: qty 10
  Filled 2021-07-18 (×2): qty 40
  Filled 2021-07-18 (×2): qty 10
  Filled 2021-07-18: qty 40
  Filled 2021-07-18 (×3): qty 10
  Filled 2021-07-18 (×2): qty 40

## 2021-07-18 MED ORDER — SODIUM CHLORIDE 0.9 % IV SOLN: INTRAVENOUS | Status: DC

## 2021-07-18 MED ORDER — DEXTROSE 50 % IV SOLN
INTRAVENOUS | Status: AC
  Filled 2021-07-18: qty 50

## 2021-07-18 MED ORDER — LOPERAMIDE HCL 1 MG/7.5ML PO SUSP
2.0000 mg | Freq: Every day | ORAL | Status: DC
  Administered 2021-07-18 – 2021-07-19 (×2): 2 mg
  Filled 2021-07-18 (×2): qty 15

## 2021-07-18 NOTE — Progress Notes (Signed)
PT Cancellation Note  Patient Details Name: Alexander Duran MRN: [id number] DOB: July 11, 1945   Cancelled Treatment:    Reason Eval/Treat Not Completed: Patient declined, no reason specified. Pt reporting he did not sleep well last night and requested PT return later in afternoon once he gets a nap. Will plan to follow-up later as time permits.   Moishe Spice, PT, DPT Acute Rehabilitation Services  Pager: [phone] Office: [place] 07/18/2021, 8:23 AM

## 2021-07-18 NOTE — Final Progress Note (Signed)
Unable to get Temp orally or Axillary tried 2 different therm. Patient did not to do Rectal temp at this time. Patient wants more sleep. Hard for him to sleep with CBG'S Q 4 hrs along with V.S. q 4 hrs and lab draws during the night. Emotional support attempted .

## 2021-07-18 NOTE — Progress Notes (Addendum)
[place] [phone] [facility]   [facility] HLT continues to follow patient for D/C planning. Per MD, patient has not been medically cleared to D/C and will likely be hospitalized for an additional one-two days. [place] staff updated.   Please call with any questions/concerns.    Thank you for the opportunity to participate in this patient's care.   Daphene Calamity, MSW [facility] Liaison  [phone]

## 2021-07-18 NOTE — Progress Notes (Signed)
PROGRESS NOTE    Alexander Duran  IWP:[id number] DOB: 10-28-1945 DOA: 07/12/2021 PCP: Windy Fast, MD    Chief Complaint  Patient presents with   Diarrhea    Brief Narrative:  Alexander Duran is a 76 y.o. 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  Of note, patient was recently hospitalized starting 06/17/2021 for COVID-19 infection, with associated positive COVID-19 test on the day of admission. On the day of admission, pt had trouble with left arm numbness and a code stroke was called. MRI of the brain was negative for acute stroke, pt has chronic right ICA stenosis.  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  Palliative care consulted in view of his recurrent admissions, increased debility and deconditioning. He wanted to go home initially with hospice but now he would like to go home with comfort measures in place. At the same time, he is also requesting to work with PT/OT and get strong enough to get out of bed and walk before going home.     Assessment & Plan:   Principal Problem:   Dehydration Active Problems:   Hypothyroidism   Diarrhea   Generalized weakness   Acute hyponatremia   HLD (hyperlipidemia)   Pressure injury of skin   Dehydration probably from diarrhea . Pt reports at home he was constipated and he was asked to increased the rate of the tube feeds and take stool softeners and laxatives. Since then he had been having multiple episodes of diarrhea.  He reports continued diarrhea in the hospital Use Imodium as needed   Generalized weakness with some tingling and numbness in the  fingers of left hand and left leg as well as significant dysarthria MRI of the brain is negative for acute intracranial abnormalities or acute stroke. 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 He was seen by neurology who felt that he may have had a small infarct that was not apparent on MRI Recommendations were to continue aspirin and add Plavix for the next 3 weeks We have added gabapentin to help with neuropathic pain.  Pt reports the tingling and numbness improved.  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  BP parameters improved with fluid boluses, currently normotensive CXR this morning showing left lower lobe consolidations. Started the patient on IV rocephin and zithromax. On 1/30 pt started cough with blood tinged sputum. CT chest showed . Airspace consolidation involving the entire left lower lobe compatible with pneumonia. Tree-in-bud and ill-defined nodular densities in the right lower  lobe and left upper lobe, likely infectious/inflammatory. MRSA PCR negative, vancomycin discontinued Currently is not on any supplemental oxygen, will de-escalate cefepime to ceftriaxone for now Unclear if he has post covid pneumonia.  COVID-19 PCR is negative.  Urinalysis is negative for  infection.    Hyponatremia Probably initially  secondary to hypovolemia from dehydration in the setting of GI losses in the form of diarrhea. ? A component of SIADH. In view of his left lower lung pneumonia.  Differential include SIADH in the context of recent COVID-19 infection. Continue with fluid restriction, continue with urea and lasix . Sodium remains stable around [place] nephrology recommendations and following.  Tsh elevated. Cortisol is wnl this morning.  Continue to monitor.   Hypothyroidism TSH high, low free t3, free t4 level normal. Pt was on synthroid 75 mcg daily.  Increased synthroid to 100 mcg daily.    Hyperlipidemia Continue with statin.  Generalized weakness -Patient undergoing PT/OT -Reports that prior to admission he was ambulating with a walker as needed -His wife sometimes helps care for him  Pressure injury present on admission.  Pressure Injury 07/13/21 Sacrum Medial Stage 2 -  Partial thickness loss of dermis presenting as a shallow open injury with a red, pink wound bed without slough. (Active)  07/13/21 0000  Location: Sacrum  Location Orientation: Medial  Staging: Stage 2 -  Partial thickness loss of dermis presenting as a shallow open injury with a red, pink wound bed without slough.  Wound Description (Comments):   Present on Admission: Yes   Wound care consulted.    Nutrition:  Dietary consulted for initiation of tube feeds.    Normocytic anemia Anemia of chronic diseas;  Hemoglobin around 10.9.    Hypoglycemia:  - unclear , asymptomatic. On tube feeds. Will continue to monitor.   Goals of care -Seen by palliative care and patient does elect DNR status -He will plan on enrolling in hospice care services upon discharge home -He is currently not on comfort care and wishes to continue to "treat the treatable".   DVT prophylaxis: SCDs Code Status: DNR  Family Communication: none at bedside.  Disposition:   Status is:  Inpatient  Remains inpatient appropriate because: hyponatremia.        Consultants:  Neurology.  Nephrology Palliative care  Procedures: CTA head and neck.  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 Severe left neural foraminal stenosis at C3-4 and moderate bilateral neural foraminal stenosis at C4-5.  MRI brain:  Chronic right ICA occlusion.   Antimicrobials: Antibiotics Given (last 72 hours)     Date/Time Action Medication Dose Rate   07/15/21 2138 New Bag/Given   cefTRIAXone (ROCEPHIN) 1 g in sodium chloride 0.9 % 100 mL IVPB 1 g 200 mL/hr   07/16/21 0937 New Bag/Given   azithromycin (ZITHROMAX) 500 mg in sodium chloride 0.9 % 250 mL IVPB 500 mg 250 mL/hr   07/16/21 2134 New Bag/Given   cefTRIAXone (ROCEPHIN) 1 g in sodium chloride 0.9 % 100 mL IVPB 1 g 200 mL/hr   07/17/21 0148 New Bag/Given   ceFEPIme (MAXIPIME) 2 g in sodium chloride 0.9 % 100 mL IVPB 2 g 200 mL/hr   07/17/21 0303 New Bag/Given   vancomycin (VANCOCIN) IVPB 1000 mg/200 mL premix 1,000 mg 200 mL/hr   07/17/21 0807 New Bag/Given   ceFEPIme (MAXIPIME) 2 g in sodium chloride 0.9 % 100 mL IVPB 2  g 200 mL/hr   07/17/21 1049 New Bag/Given   vancomycin (VANCOREADY) IVPB 750 mg/150 mL 750 mg 150 mL/hr   07/17/21 1730 New Bag/Given   ceFEPIme (MAXIPIME) 2 g in sodium chloride 0.9 % 100 mL IVPB 2 g 200 mL/hr   07/17/21 2129 New Bag/Given   vancomycin (VANCOREADY) IVPB 750 mg/150 mL 750 mg 150 mL/hr   07/18/21 0106 New Bag/Given   ceFEPIme (MAXIPIME) 2 g in sodium chloride 0.9 % 100 mL IVPB 2 g 200 mL/hr   07/18/21 0911 New Bag/Given   ceFEPIme (MAXIPIME) 2 g in sodium chloride 0.9 % 100 mL IVPB 2 g 200 mL/hr   07/18/21 1727 New Bag/Given   cefTRIAXone (ROCEPHIN) 1 g in sodium chloride 0.9 % 100 mL IVPB 1 g 200 mL/hr       Subjective: Patient became dizzy on standing.  Reports frequent stools.   Staff reports that stools were "rusty colored" earlier today.  Denies any abdominal pain.  He has not had any nausea or vomiting.  He feels generally weak.  He has not had any cough.  He denies any shortness of breath at this time.  Objective: Vitals:   07/18/21 1323 07/18/21 1549 07/18/21 1600 07/18/21 1914  BP: 134/90 (!) 108/56 136/73 119/83  Pulse: 75 80 71 67  Resp: 15 16 11 16   Temp:  98.7 F (37.1 C)  97.7 F (36.5 C)  TempSrc:  Oral  Oral  SpO2: 98% 99% 99% 100%  Weight:      Height:        Intake/Output Summary (Last 24 hours) at 07/18/2021 2027 Last data filed at 07/18/2021 1551 Gross per 24 hour  Intake 520 ml  Output 3800 ml  Net -3280 ml   Filed Weights   07/14/21 1000 07/15/21 0429 07/18/21 0412  Weight: 59.2 kg 55.6 kg 53.5 kg    Examination:  General exam: Appears calm and comfortable  Respiratory system: diminished air entry at bases, normal resp effort  Cardiovascular system: S1 & S2 heard, RRR. No JVD,  No pedal edema. Gastrointestinal system: Abdomen is nondistended, soft and nontender.  Normal bowel sounds heard. Central nervous system: Alert and oriented. No focal neurological deficits. Extremities: Symmetric 5 x 5 power. Skin: No rashes, lesions or ulcers Psychiatry:  Mood & affect appropriate.         Data Reviewed: I have personally reviewed following labs and imaging studies  CBC: Recent Labs  Lab 07/12/21 2126 07/13/21 0338 07/14/21 0609 07/15/21 0412 07/18/21 0242 07/18/21 1641  WBC 4.6 4.1 2.8* 4.2 3.4* 3.4*  NEUTROABS 3.7 2.9  --   --  2.1  --   HGB 10.9* 9.4* 9.7* 10.9* 10.1* 10.5*  HCT 31.9* 26.9* 27.3* 32.0* 28.8* 30.2*  MCV 90.6 90.6 90.4 90.4 90.9 90.7  PLT 356 323 297 371 297 502    Basic Metabolic Panel: Recent Labs  Lab 07/12/21 2126 07/13/21 0338 07/13/21 1723 07/14/21 0609 07/14/21 1232 07/15/21 0412 07/15/21 1231 07/16/21 0609 07/16/21 1237 07/16/21 2145 07/17/21 0447 07/17/21 1229 07/18/21 0242  07/18/21 1641  NA 125* 125*   < > 127*   < > 127*   < > 127*   < > 127* 127* 129* 128* 131*  K 4.8 4.5   < > 4.2  --  3.8  --  4.3  --   --   --   --  4.4 4.3  CL 87* 93*   < > 96*  --  95*  --  91*  --   --   --   --  92* 92*  CO2 29 25   < > 21*  --  27  --  29  --   --   --   --  28 32  GLUCOSE 95 55*   < > 43*  --  86  --  93  --   --   --   --  79 96  BUN 20 16   < > 15  --  9  --  23  --   --   --   --  57* 52*  CREATININE 0.62 0.59*   < > 0.63  --  0.49*  --  0.49*  --   --   --   --  0.51* 0.50*  CALCIUM 9.3 8.5*   < > 8.1*  --  8.0*  --  8.4*  --   --   --   --  8.4* 8.7*  MG 2.1 1.8  --   --   --  1.7  --   --   --   --   --   --   --   --   PHOS  --  3.0  --   --   --  3.1  --   --   --   --   --   --   --   --    < > = values in this interval not displayed.    GFR: Estimated Creatinine Clearance: 62.2 mL/min (A) (by C-G formula based on SCr of 0.5 mg/dL (L)).  Liver Function Tests: Recent Labs  Lab 07/12/21 2126 07/13/21 0338  AST 42* 34  ALT 32 27  ALKPHOS 74 59  BILITOT 0.6 0.4  PROT 6.5 5.3*  ALBUMIN 3.3* 2.8*    CBG: Recent Labs  Lab 07/18/21 0431 07/18/21 0739 07/18/21 1053 07/18/21 1552 07/18/21 2001  GLUCAP 78 114* 120* 113* 91     Recent Results (from the past 240 hour(s))  Blood culture (routine x 2)     Status: None   Collection Time: 07/12/21  9:35 PM   Specimen: BLOOD  Result Value Ref Range Status   Specimen Description BLOOD SITE NOT SPECIFIED  Final   Special Requests   Final    BOTTLES DRAWN AEROBIC AND ANAEROBIC Blood Culture adequate volume   Culture   Final    NO GROWTH 5 DAYS Performed at [facility], [place] [address] [place], [place] [address]    Report Status 07/17/2021 FINAL  Final  Resp Panel by RT-PCR (Flu A&B, Covid) Nasopharyngeal Swab     Status: None   Collection Time: 07/12/21  9:37 PM   Specimen: Nasopharyngeal Swab; Nasopharyngeal(NP) swabs in vial transport medium  Result Value Ref Range Status   SARS  Coronavirus 2 by RT PCR NEGATIVE NEGATIVE Final    Comment: (NOTE) SARS-CoV-2 target nucleic acids are NOT DETECTED.  The SARS-CoV-2 RNA is generally detectable in upper respiratory specimens during the acute phase of infection. The lowest concentration of SARS-CoV-2 viral copies this assay can detect is 138 copies/mL. A negative result does not preclude SARS-Cov-2 infection and should not be used as the sole basis for treatment or other patient management decisions. A negative result may occur with  improper specimen collection/handling, submission of specimen other than nasopharyngeal swab, presence of viral mutation(s) within the areas targeted by this assay, and inadequate number of viral copies(<138 copies/mL).  A negative result must be combined with clinical observations, patient history, and epidemiological information. The expected result is Negative.  Fact Sheet for Patients:  EntrepreneurPulse.com.au  Fact Sheet for Healthcare Providers:  IncredibleEmployment.be  This test is no t yet approved or cleared by the [place] FDA and  has been authorized for detection and/or diagnosis of SARS-CoV-2 by FDA under an Emergency Use Authorization (EUA). This EUA will remain  in effect (meaning this test can be used) for the duration of the COVID-19 declaration under Section 564(b)(1) of the Act, 21 U.S.C.section 360bbb-3(b)(1), unless the authorization is terminated  or revoked sooner.       Influenza A by PCR NEGATIVE NEGATIVE Final   Influenza B by PCR NEGATIVE NEGATIVE Final    Comment: (NOTE) The Xpert Xpress SARS-CoV-2/FLU/RSV plus assay is intended as an aid in the diagnosis of influenza from Nasopharyngeal swab specimens and should not be used as a sole basis for treatment. Nasal washings and aspirates are unacceptable for Xpert Xpress SARS-CoV-2/FLU/RSV testing.  Fact Sheet for  Patients: EntrepreneurPulse.com.au  Fact Sheet for Healthcare Providers: IncredibleEmployment.be  This test is not yet approved or cleared by the [place] FDA and has been authorized for detection and/or diagnosis of SARS-CoV-2 by FDA under an Emergency Use Authorization (EUA). This EUA will remain in effect (meaning this test can be used) for the duration of the COVID-19 declaration under Section 564(b)(1) of the Act, 21 U.S.C. section 360bbb-3(b)(1), unless the authorization is terminated or revoked.  Performed at [facility], [place] [address] [place], [place] [address]   Blood culture (routine x 2)     Status: None   Collection Time: 07/12/21  9:40 PM   Specimen: BLOOD RIGHT FOREARM  Result Value Ref Range Status   Specimen Description BLOOD RIGHT FOREARM  Final   Special Requests   Final    BOTTLES DRAWN AEROBIC AND ANAEROBIC Blood Culture results may not be optimal due to an inadequate volume of blood received in culture bottles   Culture   Final    NO GROWTH 5 DAYS Performed at [facility], [place] [address] [place], [place] [address]    Report Status 07/17/2021 FINAL  Final  MRSA Next Gen by PCR, Nasal     Status: None   Collection Time: 07/17/21  8:44 AM   Specimen: Nasal Mucosa; Nasal Swab  Result Value Ref Range Status   MRSA by PCR Next Gen NOT DETECTED NOT DETECTED Final    Comment: (NOTE) The GeneXpert MRSA Assay (FDA approved for NASAL specimens only), is one component of a comprehensive MRSA colonization surveillance program. It is not intended to diagnose MRSA infection nor to guide or monitor treatment for MRSA infections. Test performance is not FDA approved in patients less than 35 years old. Performed at [facility], [place] [address] [place], [place] [address]           Radiology Studies: CT CHEST WO CONTRAST  Result Date: 07/17/2021 CLINICAL DATA:  Hemoptysis. EXAM: CT CHEST WITHOUT  CONTRAST TECHNIQUE: Multidetector CT imaging of the chest was performed following the standard protocol without IV contrast. RADIATION DOSE REDUCTION: This exam was performed according to the departmental dose-optimization program which includes automated exposure control, adjustment of the mA and/or kV according to patient size and/or use of iterative reconstruction technique. COMPARISON:  Chest CT 03/09/2021. FINDINGS: Cardiovascular: Heart and aorta are normal in size. There is no pericardial effusion. There are atherosclerotic calcifications of the  aorta and coronary arteries. Mediastinum/Nodes: No enlarged mediastinal or axillary lymph nodes. Thyroid gland, trachea, and esophagus demonstrate no significant findings. Lungs/Pleura: Bilateral pleuroparenchymal scarring appears unchanged from prior. There are small bilateral pleural effusions, left greater than right. There is airspace consolidation with air bronchograms throughout the entire right lower lobe. Tree-in-bud and ill-defined nodular densities are seen in the right lower lobe and left upper lobe. Calcified granuloma is noted in the right upper lobe. No pneumothorax visualized. Trachea and central airways appear patent. Upper Abdomen: Percutaneous gastrostomy tube present. Musculoskeletal: No acute fractures. IMPRESSION: 1. 1. Airspace consolidation involving the entire left lower lobe compatible with pneumonia. Follow-up imaging recommended in 4-6 weeks to confirm complete resolution and to exclude underlying pathology. 2. Small bilateral pleural effusions. 3. Tree-in-bud and ill-defined nodular densities in the right lower lobe and left upper lobe, likely infectious/inflammatory. 4. Aortic Atherosclerosis (ICD10-I70.0). Electronically Signed   By: Ronney Asters M.D.   On: 07/17/2021 00:54        Scheduled Meds:  aspirin  81 mg Per Tube QPM   atorvastatin  20 mg Per Tube Daily   free water  30 mL Per Tube Q4H   gabapentin  100 mg Per Tube BID    levothyroxine  100 mcg Per Tube QAC breakfast   loperamide HCl  2 mg Per Tube Daily   loratadine  10 mg Per Tube Daily   midodrine  5 mg Per Tube TID WC   multivitamin with minerals  1 tablet Per Tube Daily   pantoprazole (PROTONIX) IV  40 mg Intravenous Q12H   urea  30 g Per Tube BID   Continuous Infusions:  sodium chloride     cefTRIAXone (ROCEPHIN)  IV 1 g (07/18/21 1727)   feeding supplement (OSMOLITE 1.5 CAL) 50 mL/hr at 07/18/21 0547     LOS: 5 days        Kathie Dike, MD [facility]   To contact the attending provider between 7A-7P or the covering provider during after hours 7P-7A, please log into the web site [email or web address] and access using universal [place] password for that web site. If you do not have the password, please call the hospital operator.  07/18/2021, 8:27 PM

## 2021-07-18 NOTE — Progress Notes (Signed)
Physical Therapy Treatment Patient Details Name: Alexander Duran MRN: [id number] DOB: [birth year] Today's Date: 07/18/2021   History of Present Illness Pt is a 76 y/o male presenting on 1/26 with diarrhea.  Admitted with dehydration, complicated by generalized weakness and hyponatremia. Noted recent admission 06/17/21 for covid infection. L sided numbness and hypotension began 1/27, code stroke activated. CT/CTA negative. MRI brain/cervical spine negative for acute abnormalities. 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    PT Comments    Pt was able to progress to sitting EOB and coming to stand at EOB 1x with minA today. However, pt limited to standing briefly due to having symptomatic orthostatic hypotension, see General Comments below for BP measurements. Pt also with noted bloody stool upon entry. RN and MD made aware of both concerns. Pt is motivated to becoming more independent so he can function close to if not at his baseline prior to returning home. Pt would benefit from a HEP and therabands for d/c as he plans to go home with hospice instead. Will continue to follow acutely.    Recommendations for follow up therapy are one component of a multi-disciplinary discharge planning process, led by the attending physician.  Recommendations may be updated based on patient status, additional functional criteria and insurance authorization.  Follow Up Recommendations  Skilled nursing-short term rehab (<3 hours/day) (plans to d/c home with hospice though)     Assistance Recommended at Discharge Frequent or constant Supervision/Assistance  Patient can return home with the following Two people to help with walking and/or transfers;Two people to help with bathing/dressing/bathroom;A lot of help with walking and/or transfers;A lot of help with bathing/dressing/bathroom;Direct supervision/assist for medications management;Direct supervision/assist for financial  management;Assist for transportation;Help with stairs or ramp for entrance;Assistance with cooking/housework   Equipment Recommendations  Wheelchair (measurements PT);Wheelchair cushion (measurements PT);Hospital bed (hospital bed with overlay mattress to protect skin/further wounds from developing; pt declining hospital bed and w/c though)    Recommendations for Other Services       Precautions / Restrictions Precautions Precautions: Fall Precaution Comments: g-tube; orthostatic hypotension Restrictions Weight Bearing Restrictions: No     Mobility  Bed Mobility Overal bed mobility: Needs Assistance Bed Mobility: Rolling, Sidelying to Sit, Sit to Sidelying Rolling: Supervision Sidelying to sit: Min assist     Sit to sidelying: Max assist General bed mobility comments: rolling for hygiene with supervision using bed rails, transitioned to EOB with min assist for trunk support; required max assist to return to supine due to orthrostatic after standing    Transfers Overall transfer level: Needs assistance Equipment used: Rolling walker (2 wheels) Transfers: Sit to/from Stand Sit to Stand: Min assist           General transfer comment: to power up and steady from EOB, pt pulling up on RW even though cued to push up from bed.    Ambulation/Gait               General Gait Details: Deferred due to being orthostatic   Stairs             Wheelchair Mobility    Modified Rankin (Stroke Patients Only)       Balance Overall balance assessment: Needs assistance Sitting-balance support: No upper extremity supported, Feet supported Sitting balance-Leahy Scale: Fair     Standing balance support: Bilateral upper extremity supported, During functional activity Standing balance-Leahy Scale: Poor Standing balance comment: relies on BUE and external support  Cognition Arousal/Alertness: Awake/alert Behavior During Therapy:  WFL for tasks assessed/performed Overall Cognitive Status: Impaired/Different from baseline Area of Impairment: Memory, Awareness, Problem solving                     Memory: Decreased short-term memory     Awareness: Emergent Problem Solving: Slow processing, Requires verbal cues General Comments: pt perseverating on needing to change to bolus feedings throughout session, reinforced therapy would speak to RN about his request but pt continues to ask        Exercises General Exercises - Lower Extremity Heel Slides: AROM, Strengthening, Right, 10 reps, Supine    General Comments General comments (skin integrity, edema, etc.): on RA SpO2 decreased to mid-80s% when standing but rebounded at rest on RA to 90s%, RN notified of bloody stool, BP:  114/62 supine prior ,  70/52 standing ,  134/90 upright in bed; encouraged pt to sit up in bed to improve upright tolerance      Pertinent Vitals/Pain Pain Assessment Pain Assessment: Faces Faces Pain Scale: No hurt Pain Intervention(s): Monitored during session    Home Living                          Prior Function            PT Goals (current goals can now be found in the care plan section) Acute Rehab PT Goals Patient Stated Goal: to get back to doing what he was at baseline PT Goal Formulation: With patient Time For Goal Achievement: 07/27/21 Potential to Achieve Goals: Fair Progress towards PT goals: Progressing toward goals    Frequency    Min 3X/week      PT Plan Discharge plan needs to be updated;Frequency needs to be updated    Co-evaluation PT/OT/SLP Co-Evaluation/Treatment: Yes Reason for Co-Treatment: For patient/therapist safety;To address functional/ADL transfers PT goals addressed during session: Mobility/safety with mobility;Balance;Strengthening/ROM OT goals addressed during session: ADL's and self-care      AM-PAC PT "6 Clicks" Mobility   Outcome Measure  Help needed turning from  your back to your side while in a flat bed without using bedrails?: A Little Help needed moving from lying on your back to sitting on the side of a flat bed without using bedrails?: A Little Help needed moving to and from a bed to a chair (including a wheelchair)?: A Lot Help needed standing up from a chair using your arms (e.g., wheelchair or bedside chair)?: A Little Help needed to walk in hospital room?: Total Help needed climbing 3-5 steps with a railing? : Total 6 Click Score: 13    End of Session   Activity Tolerance: Other (comment) (limited by orthostatic hypotension) Patient left: in bed;with call bell/phone within reach;with bed alarm set;with family/visitor present Nurse Communication: Mobility status;Other (comment) (vitals, bloody stool) PT Visit Diagnosis: Other abnormalities of gait and mobility (R26.89);Muscle weakness (generalized) (M62.81);Unsteadiness on feet (R26.81);Difficulty in walking, not elsewhere classified (R26.2)     Time: 8921-1941 PT Time Calculation (min) (ACUTE ONLY): 37 min  Charges:  $Therapeutic Activity: 8-22 mins                     Moishe Spice, PT, DPT Acute Rehabilitation Services  Pager: [phone] Office: [place] 07/18/2021, 2:43 PM

## 2021-07-18 NOTE — Progress Notes (Signed)
Nephrology Follow-Up Consult note   Assessment/Recommendations: Alexander Duran is a/an 76 y.o. 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     Dehydration: Secondary to diarrhea.  Now is improved  Pneumonia: Visualized on CT scan.  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  Sodium relatively stable 128.  Sodium has remained stable at 128 without significant symptoms.  He is currently on urea 30 g twice daily, Lasix 10 mg twice daily, and fluid restriction.  However, he is starting to venture towards a more palliative approach in his care.  His goal is to get more sleep at night and to get home so he can receive hospice.  I think it will take some finesse to find a perfect balance between medical care and achieving his comfort goals.  I do think that I can sign off at this time.  Most of these medications can be continued for comfort to maintain sodium but whenever the medications or fluid restriction become harder than having hyponatremia I think these medications can be stopped with more focus on comfort.  Generalized weakness/Frustration: Management per primary team.  Agree with palliative care involvement  Hypothyroidism: TSH high here.  Synthroid recently increased  Hyperlipidemia: Statin   We will sign off at this time.  Please do not hesitate to contact us if further help is needed.  Recommendations conveyed to primary service.    [facility] 07/18/2021 10:08 AM  ___________________________________________________________  CC: Hyponatremia  Interval History/Subjective: Patient continues to feel fairly angry but overall feels more heard and happy that there is a future plan.  He is understanding of most of what is going on.  He continues to be frustrated  with early morning lab checks as well as vital signs overnight.  He really wants to get some sleep overnight.  His goal is to get home so he can receive hospice services there.  He does want to continue tube feeds and get physical therapy.  Medications:  Current Facility-Administered Medications  Medication Dose Route Frequency Provider Last Rate Last Admin   acetaminophen (TYLENOL) tablet 650 mg  650 mg Per Tube Q6H PRN Howerter, Justin B, DO   650 mg at 07/16/21 2144   Or   acetaminophen (TYLENOL) suppository 650 mg  650 mg Rectal Q6H PRN Howerter, Justin B, DO       ALPRAZolam (XANAX) tablet 0.25 mg  0.25 mg Per Tube TID PRN Hosie Poisson, MD       aspirin chewable tablet 81 mg  81 mg Per Tube QPM Howerter, Justin B, DO   81 mg at 07/17/21 1726   atorvastatin (LIPITOR) tablet 20 mg  20 mg Per Tube Daily Howerter, Justin B, DO   20 mg at 07/18/21 0908   ceFEPIme (MAXIPIME) 2 g in sodium chloride 0.9 % 100 mL IVPB  2 g Intravenous Q8H Chotiner, Yevonne Aline, MD 200 mL/hr at 07/18/21 0911 2 g at 07/18/21 0911   clopidogrel (PLAVIX) tablet 75 mg  75 mg Per Tube Daily Hosie Poisson, MD   75 mg at 07/16/21 0921   feeding supplement (OSMOLITE 1.5 CAL) liquid 1,000 mL  1,000 mL Per Tube Continuous Hosie Poisson, MD 50 mL/hr at 07/18/21 0547 Rate Change at 07/18/21 0547   free water 30 mL  30 mL Per Tube Q4H Hosie Poisson, MD  30 mL at 07/18/21 0911   gabapentin (NEURONTIN) 250 MG/5ML solution 100 mg  100 mg Per Tube BID Hosie Poisson, MD   100 mg at 07/16/21 2100   heparin injection 5,000 Units  5,000 Units Subcutaneous Q8H Hosie Poisson, MD   5,000 Units at 07/18/21 0177   levothyroxine (SYNTHROID) tablet 100 mcg  100 mcg Per Tube QAC breakfast Hosie Poisson, MD   100 mcg at 07/18/21 9390   loperamide HCl (IMODIUM) 1 MG/7.5ML suspension 2 mg  2 mg Per Tube PRN Hosie Poisson, MD       loratadine (CLARITIN) tablet 10 mg  10 mg Per Tube Daily Howerter, Justin B, DO   10 mg at 07/17/21 3009   midodrine  (PROAMATINE) tablet 5 mg  5 mg Per Tube TID WC Howerter, Justin B, DO   5 mg at 07/16/21 1208   morphine 2 MG/ML injection 2 mg  2 mg Intravenous Q2H PRN Howerter, Justin B, DO       multivitamin with minerals tablet 1 tablet  1 tablet Per Tube Daily Hosie Poisson, MD   1 tablet at 07/18/21 0908   oxyCODONE (ROXICODONE) 5 MG/5ML solution 5 mg  5 mg Per Tube Q4H PRN Hosie Poisson, MD       traMADol (ULTRAM) tablet 50 mg  50 mg Per Tube Q8H PRN Georgette Shell, MD   50 mg at 07/18/21 0106   urea (URE-NA) oral packet 30 g  30 g Per Tube BID Reesa Chew, MD   30 g at 07/18/21 2330      Review of Systems: 10 systems reviewed and negative except per interval history/subjective  Physical Exam: Vitals:   07/18/21 0651 07/18/21 0737  BP:  132/78  Pulse:  67  Resp:  16  Temp:  (!) 97.4 F (36.3 C)  SpO2: 100% 100%   Total I/O In: -  Out: 300 [Urine:300]  Intake/Output Summary (Last 24 hours) at 07/18/2021 1008 Last data filed at 07/18/2021 0762 Gross per 24 hour  Intake 870 ml  Output 2700 ml  Net -1830 ml   Constitutional: Chronically ill-appearing, lying in bed, no distress ENMT: ears and nose without scars or lesions, dry mucous membranes CV: normal rate, no edema Respiratory: Bilateral chest rise, normal work of breathing Gastrointestinal: soft, non-tender, bowel sounds present Skin: no visible lesions or rashes Psych: alert, mood and affect appropriate   Test Results I personally reviewed new and old clinical labs and radiology tests Lab Results  Component Value Date   NA 128 (L) 07/18/2021   K 4.4 07/18/2021   CL 92 (L) 07/18/2021   CO2 28 07/18/2021   BUN 57 (H) 07/18/2021   CREATININE 0.51 (L) 07/18/2021   CALCIUM 8.4 (L) 07/18/2021   ALBUMIN 2.8 (L) 07/13/2021   PHOS 3.1 07/15/2021

## 2021-07-18 NOTE — Progress Notes (Addendum)
CBG 62. Patient voiced no complaints skin warm and dry.  Stephanine R.N.[place] and agree with 1/2 Amp. D50% to be given since patient is NPO . Rechecked CBG 93 at 01:03 . Cont. To monitor patient.

## 2021-07-18 NOTE — Progress Notes (Addendum)
Occupational Therapy Treatment Patient Details Name: Alexander Duran MRN: [id number] DOB: Aug 20, 1945 Today's Date: 07/18/2021   History of present illness Pt is a 76 y/o male presenting on 1/26 with diarrhea.  Admitted with dehydration, complicated by generalized weakness and hyponatremia. Noted recent admission 06/17/21 for covid infection. L sided numbness and hypotension began 1/27, code stroke activated. CT/CTA negative. MRI brain/cervical spine negative for acute abnormalities. 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   OT comments  Pt supine in bed and agreeable to OT/PT session.  Requires total assist for hygiene after +BM in bed, RN notified of bloody stool.  Patient agreeable to EOB, completing with min assist; standing at EOB briefly with min assist using RW but becoming dizzy with + orthostatic and required max assist to return supine.  Upright in bed with setup for grooming tasks.  Updated dc plan to no follow up, as he plans to dc home with hospice care.  He will require physical assist for all transfers, mobility and ADLs at dc. Will follow acutely.   BP: 114/62 supine prior  70/52 standing  134/90 upright in bed    Recommendations for follow up therapy are one component of a multi-disciplinary discharge planning process, led by the attending physician.  Recommendations may be updated based on patient status, additional functional criteria and insurance authorization.    Follow Up Recommendations  No OT follow up (plan to dc home with hospice)    Assistance Recommended at Discharge Frequent or constant Supervision/Assistance  Patient can return home with the following  A lot of help with walking and/or transfers;A lot of help with bathing/dressing/bathroom;Direct supervision/assist for medications management;Direct supervision/assist for financial management;Assist for transportation;Help with stairs or ramp for entrance   Equipment  Recommendations  Wheelchair (measurements OT);Wheelchair cushion (measurements OT);Hospital bed    Recommendations for Other Services      Precautions / Restrictions Precautions Precautions: Fall Precaution Comments: g-tube Restrictions Weight Bearing Restrictions: No       Mobility Bed Mobility Overal bed mobility: Needs Assistance Bed Mobility: Rolling, Sidelying to Sit, Sit to Sidelying Rolling: Supervision Sidelying to sit: Min assist     Sit to sidelying: Max assist General bed mobility comments: rolling for hygiene with supervision, transitioned to EOB with min assist for trunk support; required max assist to return to supine due to orthrostatic after standing    Transfers Overall transfer level: Needs assistance Equipment used: Rolling walker (2 wheels) Transfers: Sit to/from Stand Sit to Stand: Min assist           General transfer comment: to power up and steady     Balance Overall balance assessment: Needs assistance Sitting-balance support: No upper extremity supported, Feet supported Sitting balance-Leahy Scale: Fair     Standing balance support: Bilateral upper extremity supported, During functional activity Standing balance-Leahy Scale: Poor Standing balance comment: relies on BUE and external support                           ADL either performed or assessed with clinical judgement   ADL Overall ADL's : Needs assistance/impaired     Grooming: Set up;Sitting               Lower Body Dressing: Sit to/from stand;Maximal assistance       Toileting- Clothing Manipulation and Hygiene: Total assistance;Bed level       Functional mobility during ADLs: Minimal assistance;Rolling walker (2 wheels);Cueing for  safety General ADL Comments: pt became orthostatic in standing    Extremity/Trunk Assessment              Vision       Perception     Praxis      Cognition Arousal/Alertness: Awake/alert Behavior During  Therapy: WFL for tasks assessed/performed Overall Cognitive Status: Impaired/Different from baseline Area of Impairment: Memory, Awareness, Problem solving                     Memory: Decreased short-term memory     Awareness: Emergent Problem Solving: Slow processing, Requires verbal cues General Comments: pt perseverating on needing to change to bolus feedings throughout session, reinforced therapy would speak to RN about his request but pt continues to ask        Exercises      Shoulder Instructions       General Comments on RA VSS, RN notified of bloody stool    Pertinent Vitals/ Pain       Pain Assessment Pain Assessment: Faces Faces Pain Scale: No hurt  Home Living                                          Prior Functioning/Environment              Frequency  Min 2X/week        Progress Toward Goals  OT Goals(current goals can now be found in the care plan section)  Progress towards OT goals: Progressing toward goals  Acute Rehab OT Goals Patient Stated Goal: get home OT Goal Formulation: With patient Time For Goal Achievement: 07/27/21 Potential to Achieve Goals: [place] Frequency remains appropriate;Discharge plan needs to be updated    Co-evaluation    PT/OT/SLP Co-Evaluation/Treatment: Yes Reason for Co-Treatment: For patient/therapist safety;To address functional/ADL transfers   OT goals addressed during session: ADL's and self-care      AM-PAC OT "6 Clicks" Daily Activity     Outcome Measure   Help from another person eating meals?: Total Help from another person taking care of personal grooming?: A Little Help from another person toileting, which includes using toliet, bedpan, or urinal?: Total Help from another person bathing (including washing, rinsing, drying)?: A Lot Help from another person to put on and taking off regular upper body clothing?: A Little Help from another person to put on and taking  off regular lower body clothing?: A Lot 6 Click Score: 12    End of Session Equipment Utilized During Treatment: Rolling walker (2 wheels);Gait belt  OT Visit Diagnosis: Other abnormalities of gait and mobility (R26.89);Muscle weakness (generalized) (M62.81);Pain   Activity Tolerance Treatment limited secondary to medical complications (Comment)   Patient Left in bed;with call bell/phone within reach;with bed alarm set;with family/visitor present   Nurse Communication Mobility status        Time: 2774-1287 OT Time Calculation (min): 37 min  Charges: OT General Charges $OT Visit: 1 Visit OT Treatments $Self Care/Home Management : 8-22 mins  Jolaine Artist, [place] Pager [phone] Office [place] 07/18/2021, 1:43 PM

## 2021-07-19 ENCOUNTER — Inpatient Hospital Stay (HOSPITAL_COMMUNITY): Payer: No Typology Code available for payment source

## 2021-07-19 DIAGNOSIS — E871 Hypo-osmolality and hyponatremia: Secondary | ICD-10-CM | POA: Diagnosis not present

## 2021-07-19 DIAGNOSIS — R531 Weakness: Secondary | ICD-10-CM | POA: Diagnosis not present

## 2021-07-19 DIAGNOSIS — E860 Dehydration: Secondary | ICD-10-CM | POA: Diagnosis not present

## 2021-07-19 DIAGNOSIS — R197 Diarrhea, unspecified: Secondary | ICD-10-CM | POA: Diagnosis not present

## 2021-07-19 LAB — BASIC METABOLIC PANEL
Anion gap: 3 — ABNORMAL LOW (ref 5–15)
BUN: 52 mg/dL — ABNORMAL HIGH (ref 8–23)
CO2: 34 mmol/L — ABNORMAL HIGH (ref 22–32)
Calcium: 8.5 mg/dL — ABNORMAL LOW (ref 8.9–10.3)
Chloride: 96 mmol/L — ABNORMAL LOW (ref 98–111)
Creatinine, Ser: 0.52 mg/dL — ABNORMAL LOW (ref 0.61–1.24)
GFR, Estimated: >60 mL/min (ref >60)
Glucose, Bld: 85 mg/dL (ref 70–99)
Potassium: 4.5 mmol/L (ref 3.5–5.1)
Sodium: 133 mmol/L — ABNORMAL LOW (ref 135–145)

## 2021-07-19 LAB — GLUCOSE, CAPILLARY
Glucose-Capillary: 87 mg/dL (ref 70–99)
Glucose-Capillary: 107 mg/dL — ABNORMAL HIGH (ref 70–99)
Glucose-Capillary: 74 mg/dL (ref 70–99)
Glucose-Capillary: 87 mg/dL (ref 70–99)
Glucose-Capillary: 82 mg/dL (ref 70–99)

## 2021-07-19 LAB — CBC
HCT: 28.2 % — ABNORMAL LOW (ref 39.0–52.0)
Hemoglobin: 9.5 g/dL — ABNORMAL LOW (ref 13.0–17.0)
MCH: 31.1 pg (ref 26.0–34.0)
MCHC: 33.7 g/dL (ref 30.0–36.0)
MCV: 92.5 fL (ref 80.0–100.0)
Platelets: 276 10*3/uL (ref 150–400)
RBC: 3.05 MIL/uL — ABNORMAL LOW (ref 4.22–5.81)
RDW: 16.4 % — ABNORMAL HIGH (ref 11.5–15.5)
WBC: 3.8 10*3/uL — ABNORMAL LOW (ref 4.0–10.5)
nRBC: 0 % (ref 0.0–0.2)

## 2021-07-19 MED ORDER — POLYETHYLENE GLYCOL 3350 17 G PO PACK
17.0000 g | PACK | Freq: Every day | ORAL | Status: DC
  Administered 2021-07-19: 17 g via NASOGASTRIC
  Filled 2021-07-19 (×2): qty 1

## 2021-07-19 NOTE — Progress Notes (Signed)
Physical Therapy Treatment Patient Details Name: Alexander Duran MRN: [id number] DOB: [birth year] Today's Date: 07/19/2021   History of Present Illness Pt is a 75 y/o male presenting on 1/26 with diarrhea.  Admitted with dehydration, complicated by generalized weakness and hyponatremia. Noted recent admission 06/17/21 for covid infection. L sided numbness and hypotension began 1/27, code stroke activated. CT/CTA negative. MRI brain/cervical spine negative for acute abnormalities. 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    PT Comments    Pt was able to stand with improved upright posture and for a longer period of time of ~1 min this date, but continues to be limited in mobility by orthostatic hypotension, see below. Focused remainder of session on producing a HEP handout and educating pt on exercises with theraband provided for home use at d/c. Will continue to follow acutely. Current recommendations remain appropriate.  BP:  116/69 supine 97/64 sitting 44/33 sitting directly after standing with reported lightheadedness 185/83 supine 145/73 several min after placed supine     Recommendations for follow up therapy are one component of a multi-disciplinary discharge planning process, led by the attending physician.  Recommendations may be updated based on patient status, additional functional criteria and insurance authorization.  Follow Up Recommendations  Skilled nursing-short term rehab (<3 hours/day) (plans to d/c home with hospice though)     Assistance Recommended at Discharge Frequent or constant Supervision/Assistance  Patient can return home with the following Two people to help with walking and/or transfers;Two people to help with bathing/dressing/bathroom;A lot of help with walking and/or transfers;A lot of help with bathing/dressing/bathroom;Direct supervision/assist for medications management;Direct supervision/assist for financial  management;Assist for transportation;Help with stairs or ramp for entrance;Assistance with cooking/housework   Equipment Recommendations  Wheelchair (measurements PT);Wheelchair cushion (measurements PT);Hospital bed (hospital bed with overlay mattress to protect skin/further wounds from developing; pt declining hospital bed and w/c though)    Recommendations for Other Services       Precautions / Restrictions Precautions Precautions: Fall Precaution Comments: g-tube; orthostatic hypotension Restrictions Weight Bearing Restrictions: No     Mobility  Bed Mobility Overal bed mobility: Needs Assistance Bed Mobility: Supine to Sit, Sit to Supine     Supine to sit: Min guard, HOB elevated Sit to supine: Mod assist, +2 for physical assistance, +2 for safety/equipment   General bed mobility comments: Extra time and use of bed rail to sit up R EOB with HOB elevated, min guard. ModAx2 to control trunk and legs with return to supine due to pt becoming orthostatic after standing.    Transfers Overall transfer level: Needs assistance Equipment used: Rolling walker (2 wheels) Transfers: Sit to/from Stand Sit to Stand: Min assist, +2 safety/equipment           General transfer comment: to power up and steady from EOB, cues provided to push up from bed and extend hips and look superiorly to achieve upright position.    Ambulation/Gait               General Gait Details: Deferred due to being orthostatic   Stairs             Wheelchair Mobility    Modified Rankin (Stroke Patients Only)       Balance Overall balance assessment: Needs assistance Sitting-balance support: No upper extremity supported, Feet supported Sitting balance-Leahy Scale: Fair     Standing balance support: Bilateral upper extremity supported, During functional activity Standing balance-Leahy Scale: Poor Standing balance comment: relies on  BUE and external support                             Cognition Arousal/Alertness: Awake/alert Behavior During Therapy: WFL for tasks assessed/performed Overall Cognitive Status: Impaired/Different from baseline Area of Impairment: Memory, Awareness, Problem solving                     Memory: Decreased short-term memory     Awareness: Emergent Problem Solving: Slow processing, Requires verbal cues General Comments: Pt repeating comments at times. Needs extra time to process and respond to cues.        Exercises      General Comments General comments (skin integrity, edema, etc.): BP: 116/69 supine, 97/64 sitting, 44/33 sitting directly after standing with reported lightheadedness, 185/83 supine, 145/73 several min after placed supine; provided pt with medium resistance theraband and [employer] HEP handout on theraband exercises, consisting of: seated elbow flexion with self-anchor, seated elbow extension with self-anchor, diagonal bil UE movements against theraband bil, supine hip abduction against theraband, seated LAQ with theraband, heel slides, and quad sets      Pertinent Vitals/Pain Pain Assessment Pain Assessment: Faces Faces Pain Scale: No hurt Pain Intervention(s): Monitored during session    Home Living                          Prior Function            PT Goals (current goals can now be found in the care plan section) Acute Rehab PT Goals Patient Stated Goal: to improve PT Goal Formulation: With patient Time For Goal Achievement: 07/27/21 Potential to Achieve Goals: Fair Progress towards PT goals: Progressing toward goals    Frequency    Min 3X/week      PT Plan Current plan remains appropriate    Co-evaluation              AM-PAC PT "6 Clicks" Mobility   Outcome Measure  Help needed turning from your back to your side while in a flat bed without using bedrails?: A Little Help needed moving from lying on your back to sitting on the side of a flat bed without  using bedrails?: A Little Help needed moving to and from a bed to a chair (including a wheelchair)?: A Lot Help needed standing up from a chair using your arms (e.g., wheelchair or bedside chair)?: A Little Help needed to walk in hospital room?: Total Help needed climbing 3-5 steps with a railing? : Total 6 Click Score: 13    End of Session   Activity Tolerance: Other (comment) (limited by orthostatic hypotension) Patient left: in bed;with call bell/phone within reach;with nursing/sitter in room (NT performing bath and pericare) Nurse Communication: Mobility status;Other (comment) (BP) PT Visit Diagnosis: Other abnormalities of gait and mobility (R26.89);Muscle weakness (generalized) (M62.81);Unsteadiness on feet (R26.81);Difficulty in walking, not elsewhere classified (R26.2)     Time: 5329-9242 PT Time Calculation (min) (ACUTE ONLY): 27 min  Charges:  $Therapeutic Activity: 23-37 mins                     Moishe Spice, PT, DPT Acute Rehabilitation Services  Pager: [phone] Office: [place] 07/19/2021, 1:47 PM

## 2021-07-19 NOTE — Consult Note (Signed)
Consultation  Referring Provider:   Dr. Roderic Palau Primary Care Physician:  Windy Fast, MD Primary Gastroenterologist:  Dr. Loletha Carrow       Reason for Consultation:    rectal bleeding   Impression    GI bleed without hemodynamic compromise  In the setting of Plavix and ASA   WBC 3.8 HGB 9.5 MCV 92.5 Platelets 276 BUN 52 Cr 0.52  07/14/2021 Iron 53 Ferritin 287 B12 731 FOBT positive Patient did have elevation of BUN without elevation of creatinine, minor change in hemoglobin 1 g drop.  Last endoscopy 2018.  History of oropharyngeal cancer status postradiation therapy with history of dysphagia On G-tube feedings  Pneumonia, recent COVID infection 01/01 On Rocephin  Hyponatremia Nephrology following, question component of GI losses versus SIADH Has improved this visit  Diarrhea Last BM this morning Had constipation previous   Hypotension On midodrine BP in room 90/55   Plan   -Patient high risk for endoscopic procedures at this time in setting of pneumonia, hypotension, currently on Plavix, last dose 01/30 -Unknown if at this time an endoscopic procedure would be helpful, last endoscopy 2018 with entire dilatation that helps, patient has known esophageal motility disorder-we will defer to Dr. Lorenso Courier.  In the meantime continue supportive care and monitor. -Question the need for long-term Plavix in this patient. -Protonix 40 mg IV BID. -continue G tube feedings --Continue to monitor H&H with transfusion as needed to maintain hemoglobin greater than 7. -KUB to evaluate for overflow diarrhea with previous history of constipation -Palliative care has been consulted   Thank you for your kind consultation, we will continue to follow.         HPI:   Alexander Duran is a 76 y.o. male with past medical history significant for hypertension, hypothyroidism, sleep apnea, history of carotid artery stenosis status post stent on left, reflux on Protonix outpatient, history of  oropharyngeal cancer status post radiation therapy with history of dysphagia on G-tube feeds, weight loss.  Of note patient was recently hospitalized 06/17/2021 for COVID infection. Surgeries include cardiac catheterization 2018, carotid PTA/stent intervention on the left 2018, EGD with foreign body removal 2018, laparoscopic gastrostomy 06/21/2021. He 5-pack-year smoking history, quit 4 years ago denies current alcohol use. Presented to [facility]/12/2021 with diarrhea, hyponatremia and dehydration.  Patient has had palliative consult.  Hospital course includes Diarrhea resolving, was taking laxatives and stool softeners at home which were discontinued.  Tube feedings have resumed. 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 Plavix started 07/13/2021, last dose was 01/30 according to medication log. Patient found to have left lower lobe pneumonia on 01/30, transition to IV vancomycin and IV cefepime. Hyponatremia possibly secondary to dehydration and GI losses in form of diarrhea, also concern for SIADH with left lower lung pneumonia.  Nephrology was following.  GI was consulted due to new onset bloody stools, FOBT positive, in setting of newly placed Plavix and aspirin. Patient's BUN 01/30 was 23, 02/01 increased to 57 currently at 52 without elevation of creatinine currently at 0.52. Sodium 133, potassium 4.5, white blood cell count 3.8, platelets 276 Hemoglobin on admission 10.9, on 07/15/2021 was 10.9 currently 9.5. MCV 92.5 FOBT positive  Patient is difficult to understand but has good mentation, answers questions appropriately. When approached in the room patient states "I am dying"-patient understands that he has  multiple comorbid conditions, would like to to try to do as much as he can for quality of life right now. Patient states  he has been here a week ago. States outpatient he was having a low sodium, tried to increase his tube feedings but became constipated.  Only added stool softener and began to have loose stools. Last bowel movement was this morning, per nurses note patient had 3 loose stools last night, brown small quantity.  Patient denies melena or hematochezia. Patient states he has had progressive dysphagia, going to the point where he is unable to swallow his own secretions.  Denies odynophagia. Patient denies history of GI bleed. Has never had a colonoscopy, and states he wishes he does not have a colonoscopy. Patient denies abdominal pain. Patient had physical therapy yesterday states had dizziness with standing.   Previous GI work up: Last office visit 03/21/2020 Dr. Loletha Carrow Patient had esophageal motility disorder suggestive of achalasia, motility study did not show increased LES tone to suggest that Botox would be helpful. 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    He is at high risk for endoscopic procedures due to his difficult airway management and antiplatelet therapy from severe underlying cerebrovascular disease.  Last known motility study at [facility] did not show increased LES tone to suggest that Botox injection would be helpful. At that time due to insufficient nutrition and weight loss patient was started on feeding tube placement.  04/11/2017  esophageal manometry at [facility]  found to have esophageal dysmotility.  11/15/2016 endoscopy with Dr. Loletha Carrow - Dilation in the entire esophagus. - Abnormal esophageal motility, consistent with aperistalsis. - Food in the esophagus. - Normal stomach. - Normal examined duodenum. - No specimens collected.  Past Medical History:  Diagnosis Date   Arthritis    Cancer ([place])    Tonsil cancer   Carotid artery stenosis without cerebral infarction 08/30/2015   Difficulty in swallowing    History of radiation  therapy    Hypertension    Hypothyroidism 05/09/2014   Other headache syndrome 08/10/2015   Sleep apnea    Weight loss 08/08/2015    Surgical History:  He  has a past surgical history that includes Tumor removal; Back surgery; Cardiac catheterization (N/A, 07/04/2016); Cardiac catheterization (N/A, 07/04/2016); CAROTID PTA/STENT INTERVENTION (Left, 08/02/2016); Esophagogastroduodenoscopy (N/A, 11/15/2016); Foreign Body Removal (N/A, 11/15/2016); Total hip arthroplasty (Left, 10/26/2019); and Laparoscopic gastrostomy (N/A, 06/21/2021). Family History:  His family history includes COPD in his mother; Cancer in his father; Macular degeneration in his mother. Social History:   reports that he quit smoking about 4 years ago. His smoking use included cigarettes. He has a 35.00 pack-year smoking history. He has never used smokeless tobacco. He reports that he does not currently use alcohol after a past usage of about 2.0 standard drinks per week. He reports that he does not currently use drugs after having used the following drugs: Marijuana.  Prior to Admission medications   Medication Sig Start Date End Date Taking? Authorizing Provider  albuterol (VENTOLIN HFA) 108 (90 Base) MCG/ACT inhaler Inhale 2 puffs into the lungs every 6 (six) hours as needed for wheezing or shortness of breath.   Yes [provider]  aspirin 81 MG chewable tablet Chew 81 mg by mouth every evening.    Yes [provider]  atorvastatin (LIPITOR) 20 MG tablet Take 20 mg by mouth daily.   Yes [provider]  levothyroxine (SYNTHROID, LEVOTHROID) 75 MCG tablet Take 75  mcg by mouth daily before breakfast.    Yes [provider]  midodrine (PROAMATINE) 5 MG tablet Place 1 tablet (5 mg total) into feeding tube 3 (three) times daily with meals. 06/27/21  Yes Ghimire, Henreitta Leber, MD  Nutritional Supplements (FEEDING SUPPLEMENT, OSMOLITE 1.5 CAL,) LIQD Place 355 mLs into feeding tube 4 (four) times daily.  06/27/21 09/25/21 Yes Ghimire, Henreitta Leber, MD  OVER THE COUNTER MEDICATION TheraTears Liquid Eye Drops- Place 1-2 drops into both eyes one to three times as day as needed for dryness or irritation   Yes [provider]  acetaminophen (TYLENOL) 160 MG/5ML suspension Place 20.3 mLs (650 mg total) into feeding tube every 8 (eight) hours as needed for mild pain. Patient not taking: Reported on 07/14/2021 06/27/21   Jonetta Osgood, MD  fluticasone [facility]) 50 MCG/ACT nasal spray Place 2 sprays into both nostrils daily. Patient not taking: Reported on 07/14/2021 06/28/21   Jonetta Osgood, MD  loratadine (CLARITIN) 10 MG tablet Place 1 tablet (10 mg total) into feeding tube daily. Patient not taking: Reported on 07/14/2021 06/28/21   Jonetta Osgood, MD  pantoprazole sodium (PROTONIX) 40 mg Place 40 mg into feeding tube daily. Patient not taking: Reported on 07/14/2021 06/27/21 09/25/21  Jonetta Osgood, MD    Current Facility-Administered Medications  Medication Dose Route Frequency Provider Last Rate Last Admin   0.9 %  sodium chloride infusion   Intravenous Continuous Kathie Dike, MD 75 mL/hr at 07/19/21 0240 New Bag at 07/19/21 0240   acetaminophen (TYLENOL) tablet 650 mg  650 mg Per Tube Q6H PRN Howerter, Justin B, DO   650 mg at 07/16/21 2144   Or   acetaminophen (TYLENOL) suppository 650 mg  650 mg Rectal Q6H PRN Howerter, Justin B, DO       ALPRAZolam Duanne Moron) tablet 0.25 mg  0.25 mg Per Tube TID PRN Hosie Poisson, MD       atorvastatin (LIPITOR) tablet 20 mg  20 mg Per Tube Daily Howerter, Justin B, DO   20 mg at 07/18/21 0908   cefTRIAXone (ROCEPHIN) 1 g in sodium chloride 0.9 % 100 mL IVPB  1 g Intravenous Q24H Kathie Dike, MD 200 mL/hr at 07/18/21 1727 1 g at 07/18/21 1727   feeding supplement (OSMOLITE 1.5 CAL) liquid 1,000 mL  1,000 mL Per Tube Continuous Hosie Poisson, MD 60 mL/hr at 07/19/21 0422 1,000 mL at 07/19/21 0422   free water 30 mL  30 mL Per Tube  Q4H Hosie Poisson, MD   30 mL at 07/19/21 0420   gabapentin (NEURONTIN) 250 MG/5ML solution 100 mg  100 mg Per Tube BID Hosie Poisson, MD   100 mg at 07/16/21 2100   levothyroxine (SYNTHROID) tablet 100 mcg  100 mcg Per Tube QAC breakfast Hosie Poisson, MD   100 mcg at 07/18/21 1696   loperamide HCl (IMODIUM) 1 MG/7.5ML suspension 2 mg  2 mg Per Tube PRN Hosie Poisson, MD       loperamide HCl (IMODIUM) 1 MG/7.5ML suspension 2 mg  2 mg Per Tube Daily Pierce, Dwayne A, RPH   2 mg at 07/18/21 1723   loratadine (CLARITIN) tablet 10 mg  10 mg Per Tube Daily Howerter, Justin B, DO   10 mg at 07/17/21 0808   midodrine (PROAMATINE) tablet 5 mg  5 mg Per Tube TID WC Howerter, Justin B, DO   5 mg at 07/16/21 1208   morphine 2 MG/ML injection 2 mg  2 mg Intravenous Q2H  PRN Howerter, Larkin Ina B, DO       multivitamin with minerals tablet 1 tablet  1 tablet Per Tube Daily Hosie Poisson, MD   1 tablet at 07/18/21 0908   oxyCODONE (ROXICODONE) 5 MG/5ML solution 5 mg  5 mg Per Tube Q4H PRN Hosie Poisson, MD       pantoprazole (PROTONIX) injection 40 mg  40 mg Intravenous Q12H Kathie Dike, MD   40 mg at 07/18/21 1723   traMADol (ULTRAM) tablet 50 mg  50 mg Per Tube Q8H PRN Georgette Shell, MD   50 mg at 07/18/21 1950   urea (URE-NA) oral packet 30 g  30 g Per Tube BID Reesa Chew, MD   30 g at 07/18/21 2110    Allergies as of 07/12/2021 - Review Complete 07/12/2021  Allergen Reaction Noted   Iodinated contrast media  03/09/2021    Review of Systems:    Constitutional: No weight loss, fever, chills, weakness or fatigue HEENT: Eyes: No change in vision               Ears, Nose, Throat:  No change in hearing or congestion Skin: No rash or itching Cardiovascular: No chest pain, chest pressure or palpitations   Respiratory: No SOB or cough Gastrointestinal: See HPI and otherwise negative Genitourinary: No dysuria or change in urinary frequency Neurological: No headache, dizziness or  syncope Musculoskeletal: No new muscle or joint pain Hematologic: No bleeding or bruising Psychiatric: No history of depression or anxiety     Physical Exam:  Vital signs in last 24 hours: Temp:  [97.4 F (36.3 C)-98.7 F (37.1 C)] 97.4 F (36.3 C) (02/02 0353) Pulse Rate:  [62-80] 65 (02/02 0353) Resp:  [11-18] 15 (02/02 0353) BP: (70-136)/(52-90) 98/64 (02/02 0353) SpO2:  [96 %-100 %] 96 % (02/02 0353) Weight:  [51.1 kg] 51.1 kg (02/02 0427) Last BM Date: 07/18/21  General: Cachectic, chronically ill-appearing male, difficult to understand Head: Prior neck surgery on the right side with sunken neck Eyes: sclerae anicteric,conjunctive pink  Heart:  regular rate and rhythm, no murmurs or gallops Pulm: Clear anteriorly; no wheezing Abdomen:  Soft, Flat AB, skin exam left-sided G-tube, Sluggish bowel sounds. mild tenderness in the lower abdomen. Without guarding and Without rebound, without organomegaly. Extremities:  Without edema. Msk:  Symmetrical without gross deformities. Peripheral pulses intact.  Neurologic:  Alert and  oriented x4;  grossly normal neurologically. Skin:   Dry and intact without significant lesions or rashes. Psychiatric: Demonstrates good judgement and reason without abnormal affect or behaviors.  LAB RESULTS: Recent Labs    07/18/21 0242 07/18/21 1641 07/19/21 0338  WBC 3.4* 3.4* 3.8*  HGB 10.1* 10.5* 9.5*  HCT 28.8* 30.2* 28.2*  PLT 297 319 276   BMET Recent Labs    07/18/21 0242 07/18/21 1641 07/19/21 0338  NA 128* 131* 133*  K 4.4 4.3 4.5  CL 92* 92* 96*  CO2 28 32 34*  GLUCOSE 79 96 85  BUN 57* 52* 52*  CREATININE 0.51* 0.50* 0.52*  CALCIUM 8.4* 8.7* 8.5*   LFT No results for input(s): PROT, ALBUMIN, AST, ALT, ALKPHOS, BILITOT, BILIDIR, IBILI in the last 72 hours. PT/INR No results for input(s): LABPROT, INR in the last 72 hours.  STUDIES: No results found.   Vladimir Crofts  07/19/2021, 8:05 AM

## 2021-07-19 NOTE — Progress Notes (Signed)
[facility] [id number] [facility] ([facility])    [facility] HLT continues to follow patient for D/C planning. Per MD, he is not medically cleared to D/C and will likely be hospitalized for an additional one-two days. [place] staff updated.    Please call with any questions/concerns.    Thank you for the opportunity to participate in this patient's care.   Daphene Calamity, MSW [facility] Liaison  [phone]

## 2021-07-19 NOTE — Progress Notes (Signed)
PROGRESS NOTE    Alexander Duran  JOA:[id number] DOB: 1945/09/28 DOA: 07/12/2021 PCP: Windy Fast, MD    Chief Complaint  Patient presents with   Diarrhea    Brief Narrative:  Alexander Duran is a 76 y.o. 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  Of note, patient was recently hospitalized starting 06/17/2021 for COVID-19 infection, with associated positive COVID-19 test on the day of admission. On the day of admission, pt had trouble with left arm numbness and a code stroke was called. MRI of the brain was negative for acute stroke, pt has chronic right ICA stenosis.  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  Palliative care consulted in view of his recurrent admissions, increased debility and deconditioning. He wanted to go home initially with hospice but now he would like to go home with comfort measures in place. At the same time, he is also requesting to work with PT/OT and get strong enough to get out of bed and walk before going home.     Assessment & Plan:   Principal Problem:   Dehydration Active Problems:   Hypothyroidism   Diarrhea   Generalized weakness   Acute hyponatremia   HLD (hyperlipidemia)   Pressure injury of skin   Dehydration probably from diarrhea . Pt reports at home he was constipated and he was asked to increased the rate of the tube feeds and take stool softeners and laxatives. Since then he had been having multiple episodes of diarrhea.  He reports continued diarrhea in the hospital Abdominal x-ray done 2/2 shows large stool burden in colon Started on  MiraLAX   Generalized weakness with some tingling and numbness in the fingers of left hand and left leg as well as significant dysarthria MRI of the brain is negative for acute intracranial abnormalities or acute stroke. 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 He was seen by neurology who felt that he may have had a small infarct that was not apparent on MRI Recommendations were to continue aspirin and add Plavix for the next 3 weeks We have added gabapentin to help with neuropathic pain.  Pt reports the tingling and numbness improved.  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  BP parameters improved with fluid boluses, currently normotensive CXR this morning showing left lower lobe consolidations. Started the patient on IV rocephin and zithromax. On 1/30 pt started cough with blood tinged sputum. CT chest showed . Airspace consolidation involving the entire left lower  lobe compatible with pneumonia. Tree-in-bud and ill-defined nodular densities in the right lower lobe and left upper lobe, likely infectious/inflammatory. MRSA PCR negative, vancomycin discontinued Currently is not on any supplemental oxygen, he is being treated with antibiotics with  ceftriaxone Unclear if he has post covid pneumonia.  COVID-19 PCR is negative.  Urinalysis is negative for infection.    Hyponatremia Probably initially  secondary to hypovolemia from dehydration in the setting of GI losses in the form of diarrhea. ? A component of SIADH. In view of his left lower lung pneumonia.  Differential include SIADH in the context of recent COVID-19 infection. Continue with fluid restriction, continue with urea and lasix . Sodium remains stable around 133  Appreciate nephrology recommendations and following.  Tsh elevated. Cortisol is wnl  Continue to monitor.   Hypothyroidism TSH high, low free t3, free t4 level normal. Pt was on synthroid 75 mcg daily.  Increased synthroid to 100 mcg daily.    Hyperlipidemia Continue with statin.  Generalized weakness -Patient undergoing PT/OT -Reports that prior to admission he was ambulating with a walker as needed -His wife sometimes helps care for him  Pressure injury present on admission.  Pressure Injury 07/13/21 Sacrum Medial Stage 2 -  Partial thickness loss of dermis presenting as a shallow open injury with a red, pink wound bed without slough. (Active)  07/13/21 0000  Location: Sacrum  Location Orientation: Medial  Staging: Stage 2 -  Partial thickness loss of dermis presenting as a shallow open injury with a red, pink wound bed without slough.  Wound Description (Comments):   Present on Admission: Yes   Wound care consulted.    Nutrition:  Dietary consulted for initiation of tube feeds.    Normocytic anemia Anemia of chronic disease;  May have some element of acute blood loss with GI bleeding Currently hemoglobin 9.5   Hypoglycemia:  - unclear , asymptomatic. On tube feeds. Will continue to monitor.   Goals of care -Seen by palliative care and patient does elect DNR status -He will plan on enrolling in hospice care services upon discharge home -He is currently not on comfort care and wishes  to continue to "treat the treatable".   DVT prophylaxis: SCDs Code Status: DNR  Family Communication: none at bedside.  Disposition:   Status is: Inpatient  Remains inpatient appropriate because: hyponatremia.        Consultants:  Neurology.  Nephrology Palliative care Gastroenterology  Procedures: CTA head and neck.  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 Severe left neural foraminal stenosis at C3-4 and moderate bilateral neural foraminal stenosis at C4-5.  MRI brain:  Chronic right ICA occlusion.   Antimicrobials: Antibiotics Given (last 72 hours)     Date/Time Action Medication Dose Rate   07/17/21 0148 New Bag/Given   ceFEPIme (MAXIPIME) 2 g in sodium chloride 0.9 % 100 mL IVPB 2 g 200 mL/hr   07/17/21 0303 New Bag/Given   vancomycin (VANCOCIN) IVPB 1000 mg/200 mL premix 1,000 mg 200 mL/hr   07/17/21 0807 New Bag/Given   ceFEPIme (MAXIPIME) 2 g in sodium chloride 0.9 % 100 mL IVPB 2 g 200 mL/hr   07/17/21 1049 New Bag/Given   vancomycin (VANCOREADY) IVPB 750 mg/150 mL 750 mg 150 mL/hr   07/17/21 1730 New Bag/Given   ceFEPIme (MAXIPIME) 2 g in sodium chloride 0.9 % 100 mL IVPB 2 g 200 mL/hr   07/17/21 2129  New Bag/Given   vancomycin (VANCOREADY) IVPB 750 mg/150 mL 750 mg 150 mL/hr   07/18/21 0106 New Bag/Given   ceFEPIme (MAXIPIME) 2 g in sodium chloride 0.9 % 100 mL IVPB 2 g 200 mL/hr   07/18/21 0911 New Bag/Given   ceFEPIme (MAXIPIME) 2 g in sodium chloride 0.9 % 100 mL IVPB 2 g 200 mL/hr   07/18/21 1727 New Bag/Given   cefTRIAXone (ROCEPHIN) 1 g in sodium chloride 0.9 % 100 mL IVPB 1 g 200 mL/hr   07/19/21 1727 New Bag/Given   cefTRIAXone (ROCEPHIN) 1 g in sodium chloride 0.9 % 100 mL IVPB 1 g 200 mL/hr       Subjective: Reports 4-5 bowel movements today which he says contain blood.  No nausea or vomiting.  No shortness of breath.  He did  feel as though he was going to pass out again when he tried to stand.  Noted to be orthostatic.  Objective: Vitals:   07/19/21 0800 07/19/21 1201 07/19/21 1640 07/19/21 1947  BP: (!) 90/55 (!) 145/73 112/72 104/68  Pulse: (!) 58 (!) 59 63 62  Resp: 18 18 18 10   Temp: (!) 97.5 F (36.4 C) 97.8 F (36.6 C) 97.7 F (36.5 C) 98.4 F (36.9 C)  TempSrc: Oral Oral Oral Oral  SpO2: 98% 98% 98% 96%  Weight:      Height:        Intake/Output Summary (Last 24 hours) at 07/19/2021 2137 Last data filed at 07/19/2021 1640 Gross per 24 hour  Intake 1710 ml  Output 2900 ml  Net -1190 ml   Filed Weights   07/15/21 0429 07/18/21 0412 07/19/21 0427  Weight: 55.6 kg 53.5 kg 51.1 kg    Examination:  General exam: Appears calm and comfortable  Respiratory system: diminished air entry at bases, normal resp effort  Cardiovascular system: S1 & S2 heard, RRR. No JVD,  No pedal edema. Gastrointestinal system: Abdomen is nondistended, soft and nontender.  Normal bowel sounds heard. Central nervous system: Alert and oriented. No focal neurological deficits. Extremities: Symmetric 5 x 5 power. Skin: No rashes, lesions or ulcers Psychiatry:  Mood & affect appropriate.     Data Reviewed: I have personally reviewed following labs and imaging studies  CBC: Recent Labs  Lab 07/13/21 0338 07/14/21 0609 07/15/21 0412 07/18/21 0242 07/18/21 1641 07/19/21 0338  WBC 4.1 2.8* 4.2 3.4* 3.4* 3.8*  NEUTROABS 2.9  --   --  2.1  --   --   HGB 9.4* 9.7* 10.9* 10.1* 10.5* 9.5*  HCT 26.9* 27.3* 32.0* 28.8* 30.2* 28.2*  MCV 90.6 90.4 90.4 90.9 90.7 92.5  PLT 323 297 371 297 319 350    Basic Metabolic Panel: Recent Labs  Lab 07/13/21 0338 07/13/21 1723 07/15/21 0412 07/15/21 1231 07/16/21 0609 07/16/21 1237 07/17/21 0447 07/17/21 1229 07/18/21 0242 07/18/21 1641 07/19/21 0338  NA 125*   < > 127*   < > 127*   < > 127* 129* 128* 131* 133*  K 4.5   < > 3.8  --  4.3  --   --   --  4.4 4.3 4.5  CL  93*   < > 95*  --  91*  --   --   --  92* 92* 96*  CO2 25   < > 27  --  29  --   --   --  28 32 34*  GLUCOSE 55*   < > 86  --  93  --   --   --  79 96 85  BUN 16   < > 9  --  23  --   --   --  57* 52* 52*  CREATININE 0.59*   < > 0.49*  --  0.49*  --   --   --  0.51* 0.50* 0.52*  CALCIUM 8.5*   < > 8.0*  --  8.4*  --   --   --  8.4* 8.7* 8.5*  MG 1.8  --  1.7  --   --   --   --   --   --   --   --   PHOS 3.0  --  3.1  --   --   --   --   --   --   --   --    < > = values in this interval not displayed.    GFR: Estimated Creatinine Clearance: 59.4 mL/min (A) (by C-G formula based on SCr of 0.52 mg/dL (L)).  Liver Function Tests: Recent Labs  Lab 07/13/21 0338  AST 34  ALT 27  ALKPHOS 59  BILITOT 0.4  PROT 5.3*  ALBUMIN 2.8*    CBG: Recent Labs  Lab 07/19/21 0355 07/19/21 0836 07/19/21 1155 07/19/21 1636 07/19/21 2042  GLUCAP 87 107* 74 87 82     Recent Results (from the past 240 hour(s))  Blood culture (routine x 2)     Status: None   Collection Time: 07/12/21  9:35 PM   Specimen: BLOOD  Result Value Ref Range Status   Specimen Description BLOOD SITE NOT SPECIFIED  Final   Special Requests   Final    BOTTLES DRAWN AEROBIC AND ANAEROBIC Blood Culture adequate volume   Culture   Final    NO GROWTH 5 DAYS Performed at [facility], [place] [address], [place], [place] [address]    Report Status 07/17/2021 FINAL  Final  Resp Panel by RT-PCR (Flu A&B, Covid) Nasopharyngeal Swab     Status: None   Collection Time: 07/12/21  9:37 PM   Specimen: Nasopharyngeal Swab; Nasopharyngeal(NP) swabs in vial transport medium  Result Value Ref Range Status   SARS Coronavirus 2 by RT PCR NEGATIVE NEGATIVE Final    Comment: (NOTE) SARS-CoV-2 target nucleic acids are NOT DETECTED.  The SARS-CoV-2 RNA is generally detectable in upper respiratory specimens during the acute phase of infection. The lowest concentration of SARS-CoV-2 viral copies this assay can detect is 138  copies/mL. A negative result does not preclude SARS-Cov-2 infection and should not be used as the sole basis for treatment or other patient management decisions. A negative result may occur with  improper specimen collection/handling, submission of specimen other than nasopharyngeal swab, presence of viral mutation(s) within the areas targeted by this assay, and inadequate number of viral copies(<138 copies/mL). A negative result must be combined with clinical observations, patient history, and epidemiological information. The expected result is Negative.  Fact Sheet for Patients:  EntrepreneurPulse.com.au  Fact Sheet for Healthcare Providers:  IncredibleEmployment.be  This test is no t yet approved or cleared by the [place] FDA and  has been authorized for detection and/or diagnosis of SARS-CoV-2 by FDA under an Emergency Use Authorization (EUA). This EUA will remain  in effect (meaning this test can be used) for the duration of the COVID-19 declaration under Section 564(b)(1) of the Act, 21 U.S.C.section 360bbb-3(b)(1), unless the authorization is terminated  or revoked sooner.       Influenza A by PCR NEGATIVE NEGATIVE Final  Influenza B by PCR NEGATIVE NEGATIVE Final    Comment: (NOTE) The Xpert Xpress SARS-CoV-2/FLU/RSV plus assay is intended as an aid in the diagnosis of influenza from Nasopharyngeal swab specimens and should not be used as a sole basis for treatment. Nasal washings and aspirates are unacceptable for Xpert Xpress SARS-CoV-2/FLU/RSV testing.  Fact Sheet for Patients: EntrepreneurPulse.com.au  Fact Sheet for Healthcare Providers: IncredibleEmployment.be  This test is not yet approved or cleared by the [place] FDA and has been authorized for detection and/or diagnosis of SARS-CoV-2 by FDA under an Emergency Use Authorization (EUA). This EUA will remain in effect (meaning  this test can be used) for the duration of the COVID-19 declaration under Section 564(b)(1) of the Act, 21 U.S.C. section 360bbb-3(b)(1), unless the authorization is terminated or revoked.  Performed at [facility], [place] [address] [place], [place] [address]   Blood culture (routine x 2)     Status: None   Collection Time: 07/12/21  9:40 PM   Specimen: BLOOD RIGHT FOREARM  Result Value Ref Range Status   Specimen Description BLOOD RIGHT FOREARM  Final   Special Requests   Final    BOTTLES DRAWN AEROBIC AND ANAEROBIC Blood Culture results may not be optimal due to an inadequate volume of blood received in culture bottles   Culture   Final    NO GROWTH 5 DAYS Performed at [facility], [place] [address], [place], [place] [address]    Report Status 07/17/2021 FINAL  Final  MRSA Next Gen by PCR, Nasal     Status: None   Collection Time: 07/17/21  8:44 AM   Specimen: Nasal Mucosa; Nasal Swab  Result Value Ref Range Status   MRSA by PCR Next Gen NOT DETECTED NOT DETECTED Final    Comment: (NOTE) The GeneXpert MRSA Assay (FDA approved for NASAL specimens only), is one component of a comprehensive MRSA colonization surveillance program. It is not intended to diagnose MRSA infection nor to guide or monitor treatment for MRSA infections. Test performance is not FDA approved in patients less than 67 years old. Performed at [facility], [place] [address] [place], [place] [address]           Radiology Studies: DG Abd 1 View  Result Date: 07/19/2021 CLINICAL DATA:  Diarrhea, general weakness EXAM: ABDOMEN - 1 VIEW COMPARISON:  None. FINDINGS: Nonobstructive pattern of bowel gas. Percutaneous gastrostomy tube projects over the general vicinity of the stomach. No obvious free air in the abdomen on single supine radiograph. Large burden of stool and stool balls throughout the left colon. IMPRESSION: 1. Nonobstructive pattern of bowel gas. Percutaneous gastrostomy tube  projects over the general vicinity of the stomach. 2.  Large burden of stool and stool balls throughout the left colon. Electronically Signed   By: Delanna Ahmadi M.D.   On: 07/19/2021 10:55        Scheduled Meds:  atorvastatin  20 mg Per Tube Daily   free water  30 mL Per Tube Q4H   gabapentin  100 mg Per Tube BID   levothyroxine  100 mcg Per Tube QAC breakfast   loratadine  10 mg Per Tube Daily   midodrine  5 mg Per Tube TID WC   multivitamin with minerals  1 tablet Per Tube Daily   pantoprazole (PROTONIX) IV  40 mg Intravenous Q12H   polyethylene glycol  17 g Per NG tube Daily   urea  30 g Per Tube BID  Continuous Infusions:  sodium chloride 75 mL/hr at 07/19/21 0240   cefTRIAXone (ROCEPHIN)  IV 1 g (07/19/21 1727)   feeding supplement (OSMOLITE 1.5 CAL) 1,000 mL (07/19/21 2050)     LOS: 6 days        Kathie Dike, MD [facility]   To contact the attending provider between 7A-7P or the covering provider during after hours 7P-7A, please log into the web site [email or web address] and access using universal [place] password for that web site. If you do not have the password, please call the hospital operator.  07/19/2021, 9:37 PM

## 2021-07-19 NOTE — Progress Notes (Signed)
Patient has requested a neighbor to be able to ask questions/ hear information/ acces to his record so that she can help them understand his situation. Her name is Brynda Greathouse,  [phone]. She has some medical type background as well. --JM

## 2021-07-19 NOTE — Progress Notes (Signed)
Nutrition Follow-up  DOCUMENTATION CODES:   Severe malnutrition in context of chronic illness  INTERVENTION:   Continue TF via PEG: Osmolite 1.5 at 60 ml/h to provide 2160 kcal, 90 g protein, 1277 ml free water daily.   Free water flushes 30 ml every 4 hours for tube patency.  Continue MVI with minerals daily via tube.  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  Ongoing   GOAL:   Patient will meet greater than or equal to 90% of their needs  Met with TF  MONITOR:   Labs, Weight trends, TF tolerance, Skin, I & O's  REASON FOR ASSESSMENT:   Consult Enteral/tube feeding initiation and management  ASSESSMENT:   76 y.o. 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  Patient is tolerating TF at goal rate well. He reports no signs of intolerance. He states that he was just visited by a doctor who is hoping to be able to stretch his esophagus so he may be able to eat and drink again. He says this will prolong his life and he is staying positive. He is unsure when the procedure may take place because he has to be off of Eliquis for 5 days before the procedure. Plans for discharge home with hospice. He would like to transition to bolus tube feedings. [facility] is out of Osmolite 1.5 237 ml containers. They are on backorder. Will continue 24h/d TF for now.   Remains NPO.  Labs reviewed. Na 133 CBG: [id number]  Medications reviewed and include Imodium, MVI with minerals, Protonix, urea.  Weight down to 51.1 kg today Admission weight 59.2 kg  Diet Order:   Diet Order              Diet NPO time specified  Diet effective now                   EDUCATION NEEDS:   No education needs have been identified at this time  Skin:  Skin Assessment: Skin Integrity Issues: Skin Integrity Issues:: Stage II Stage II: sacrum  Last BM:  2/2 type 5  Height:   Ht Readings from Last 1 Encounters:  07/14/21 _0  (1.676 m)    Weight:   Wt Readings from Last 1 Encounters:  07/19/21 51.1 kg    Ideal Body Weight:  64.5 kg  BMI:  Body mass index is 18.18 kg/m.  Estimated Nutritional Needs:   Kcal:  1800-2100kcal/day  Protein:  90-105g/day  Fluid:  1.6-1.8L/day    Lucas Mallow RD, LDN, CNSC Please refer to Amion for contact information.

## 2021-07-20 DIAGNOSIS — R197 Diarrhea, unspecified: Secondary | ICD-10-CM | POA: Diagnosis not present

## 2021-07-20 DIAGNOSIS — E860 Dehydration: Secondary | ICD-10-CM | POA: Diagnosis not present

## 2021-07-20 DIAGNOSIS — R531 Weakness: Secondary | ICD-10-CM | POA: Diagnosis not present

## 2021-07-20 DIAGNOSIS — E871 Hypo-osmolality and hyponatremia: Secondary | ICD-10-CM | POA: Diagnosis not present

## 2021-07-20 LAB — CBC
HCT: 27.6 % — ABNORMAL LOW (ref 39.0–52.0)
Hemoglobin: 9.1 g/dL — ABNORMAL LOW (ref 13.0–17.0)
MCH: 30.8 pg (ref 26.0–34.0)
MCHC: 33 g/dL (ref 30.0–36.0)
MCV: 93.6 fL (ref 80.0–100.0)
Platelets: 266 10*3/uL (ref 150–400)
RBC: 2.95 MIL/uL — ABNORMAL LOW (ref 4.22–5.81)
RDW: 16.5 % — ABNORMAL HIGH (ref 11.5–15.5)
WBC: 2.8 10*3/uL — ABNORMAL LOW (ref 4.0–10.5)
nRBC: 0 % (ref 0.0–0.2)

## 2021-07-20 LAB — RENAL FUNCTION PANEL
Albumin: 2.3 g/dL — ABNORMAL LOW (ref 3.5–5.0)
Anion gap: 6 (ref 5–15)
BUN: 48 mg/dL — ABNORMAL HIGH (ref 8–23)
CO2: 29 mmol/L (ref 22–32)
Calcium: 8.2 mg/dL — ABNORMAL LOW (ref 8.9–10.3)
Chloride: 97 mmol/L — ABNORMAL LOW (ref 98–111)
Creatinine, Ser: 0.42 mg/dL — ABNORMAL LOW (ref 0.61–1.24)
GFR, Estimated: >60 mL/min (ref >60)
Glucose, Bld: 101 mg/dL — ABNORMAL HIGH (ref 70–99)
Phosphorus: 2.4 mg/dL — ABNORMAL LOW (ref 2.5–4.6)
Potassium: 4.3 mmol/L (ref 3.5–5.1)
Sodium: 132 mmol/L — ABNORMAL LOW (ref 135–145)

## 2021-07-20 LAB — GLUCOSE, CAPILLARY
Glucose-Capillary: 106 mg/dL — ABNORMAL HIGH (ref 70–99)
Glucose-Capillary: 70 mg/dL (ref 70–99)
Glucose-Capillary: 80 mg/dL (ref 70–99)
Glucose-Capillary: 87 mg/dL (ref 70–99)
Glucose-Capillary: 89 mg/dL (ref 70–99)
Glucose-Capillary: 94 mg/dL (ref 70–99)

## 2021-07-20 MED ORDER — SODIUM CHLORIDE 0.9 % IV BOLUS
1000.0000 mL | Freq: Once | INTRAVENOUS | Status: AC
  Administered 2021-07-20: 1000 mL via INTRAVENOUS

## 2021-07-20 MED ORDER — SALINE SPRAY 0.65 % NA SOLN
1.0000 | NASAL | Status: DC | PRN
  Administered 2021-07-21: 1 via NASAL
  Filled 2021-07-20 (×2): qty 44

## 2021-07-20 MED ORDER — ALBUMIN HUMAN 25 % IV SOLN
50.0000 g | Freq: Once | INTRAVENOUS | Status: AC
  Administered 2021-07-20: 50 g via INTRAVENOUS
  Filled 2021-07-20: qty 200

## 2021-07-20 MED ORDER — POLYETHYLENE GLYCOL 3350 17 G PO PACK
17.0000 g | PACK | Freq: Every day | ORAL | Status: DC | PRN
  Administered 2021-07-21 – 2021-07-22 (×2): 17 g via NASOGASTRIC
  Filled 2021-07-20 (×2): qty 1

## 2021-07-20 MED ORDER — HYDROCORTISONE ACETATE 25 MG RE SUPP
25.0000 mg | Freq: Two times a day (BID) | RECTAL | Status: DC
  Administered 2021-07-21 – 2021-07-22 (×4): 25 mg via RECTAL
  Filled 2021-07-20 (×6): qty 1

## 2021-07-20 NOTE — Progress Notes (Signed)
PROGRESS NOTE    Alexander Duran  GUY:[id number] DOB: 1945-09-23 DOA: 07/12/2021 PCP: Alexander Fast, MD    Chief Complaint  Patient presents with   Diarrhea    Brief Narrative:  Alexander Duran is a 76 y.o. 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  Of note, patient was recently hospitalized starting 06/17/2021 for COVID-19 infection, with associated positive COVID-19 test on the day of admission. On the day of admission, pt had trouble with left arm numbness and a code stroke was called. MRI of the brain was negative for acute stroke, pt has chronic right ICA stenosis.  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  Palliative care consulted in view of his recurrent admissions, increased debility and deconditioning. He wanted to go home initially with hospice but now he would like to go home with comfort measures in place. At the same time, he is also requesting to work with PT/OT and get strong enough to get out of bed and walk before going home.     Assessment & Plan:   Principal Problem:   Dehydration Active Problems:   Hypothyroidism   Diarrhea   Generalized weakness   Acute hyponatremia   HLD (hyperlipidemia)   Pressure injury of skin   Dehydration probably from diarrhea . Pt reports at home he was constipated and he was asked to increased the rate of the tube feeds and take stool softeners and laxatives. Since then he had been having multiple episodes of diarrhea.  He reports continued diarrhea in the hospital Abdominal x-ray done 2/2 shows large stool burden in colon Started on MiraLAX, but  patient is resistant to take this   Generalized weakness with some tingling and numbness in the fingers of left hand and left leg as well as significant dysarthria MRI of the brain is negative for acute intracranial abnormalities or acute stroke. 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 He was seen by neurology who felt that he may have had a small infarct that was not apparent on MRI Recommendations were to continue aspirin and add Plavix for the next 3 weeks We have added gabapentin to help with neuropathic pain.  Pt reports the tingling and numbness improved.  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  BP parameters improved with fluid boluses, currently normotensive CXR this morning showing left lower lobe consolidations. Started the patient on IV rocephin and zithromax. On 1/30 pt started cough with blood tinged  sputum. CT chest showed . Airspace consolidation involving the entire left lower lobe compatible with pneumonia. Tree-in-bud and ill-defined nodular densities in the right lower lobe and left upper lobe, likely infectious/inflammatory. MRSA PCR negative, vancomycin discontinued Currently is not on any supplemental oxygen,  he is being treated with antibiotics with ceftriaxone Unclear if he has post covid pneumonia.  COVID-19 PCR is negative.  Urinalysis is negative for infection.    Hyponatremia Probably initially  secondary to hypovolemia from dehydration in the setting of GI losses in the form of diarrhea. ? A component of SIADH. In view of his left lower lung pneumonia.  Differential include SIADH in the context of recent COVID-19 infection. Continue with fluid restriction, continue with urea and lasix . Sodium remains stable around 133  Appreciate nephrology recommendations and following.  Tsh elevated. Cortisol is wnl  Continue to monitor.  Orthostatic hypotension Patient becomes significantly symptomatic on standing -will continue volume resuscitation -continue midodrine -apply ted dose -cortisol normal  Hypothyroidism TSH high, low free t3, free t4 level normal. Pt was on synthroid 75 mcg daily.  Increased synthroid to 100 mcg daily.    Hyperlipidemia Continue with statin.  Generalized weakness -Patient undergoing PT/OT -Reports that prior to admission he was ambulating with a walker as needed -His wife sometimes helps care for him -his goal is for him to return home with hospice services, but I am concerned he may not be functional enough to do that, since it appears that it is only him and his wife at home and that he was her caregiver prior to admission  Pressure injury present on admission.  Pressure Injury 07/13/21 Sacrum Medial Stage 2 -  Partial thickness loss of dermis presenting as a shallow open injury with a red, pink wound bed without slough. (Active)  07/13/21 0000  Location: Sacrum  Location Orientation: Medial  Staging: Stage 2 -  Partial thickness loss of dermis presenting as a shallow open injury with a red, pink wound bed without slough.  Wound Description (Comments):   Present on Admission: Yes   Wound care consulted.    Nutrition:  Dietary consulted for tube  feeds.    Normocytic anemia Anemia of chronic disease;  May have some element of acute blood loss with GI bleeding Currently hemoglobin 9.1   Hypoglycemia:  - unclear , asymptomatic. On tube feeds. Will continue to monitor.   Goals of care -Seen by palliative care and patient does elect DNR status -He will plan on enrolling in hospice care services upon discharge home -He is currently not on comfort care and wishes to continue to "treat the treatable".   DVT prophylaxis: SCDs Code Status: DNR  Family Communication: updated wife over the phone 2/3 Disposition:   Status is: Inpatient  Remains inpatient appropriate because: hyponatremia.        Consultants:  Neurology.(Signed off)  Nephrology (signed off) Palliative care Gastroenterology  Procedures: CTA head and neck.  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 Severe left neural foraminal stenosis at C3-4 and moderate bilateral neural foraminal stenosis at C4-5.  MRI brain:  Chronic right ICA occlusion.   Antimicrobials: Antibiotics Given (last 72 hours)     Date/Time Action Medication Dose Rate   07/18/21 0106 New Bag/Given   ceFEPIme (MAXIPIME) 2 g in sodium chloride 0.9 % 100 mL IVPB 2 g 200 mL/hr   07/18/21 0911 New Bag/Given   ceFEPIme (  MAXIPIME) 2 g in sodium chloride 0.9 % 100 mL IVPB 2 g 200 mL/hr   07/18/21 1727 New Bag/Given   cefTRIAXone (ROCEPHIN) 1 g in sodium chloride 0.9 % 100 mL IVPB 1 g 200 mL/hr   07/19/21 1727 New Bag/Given   cefTRIAXone (ROCEPHIN) 1 g in sodium chloride 0.9 % 100 mL IVPB 1 g 200 mL/hr   07/20/21 1659 New Bag/Given   cefTRIAXone (ROCEPHIN) 1 g in sodium chloride 0.9 % 100 mL IVPB 1 g 200 mL/hr       Subjective: Feels as though he will pass out when he stands. Has small streaks of stool that he reports contain small amount of fresh blood. He is coughing up rusty  colored sputum. Also reports passing clots from his nose  Objective: Vitals:   07/20/21 0847 07/20/21 1155 07/20/21 1820 07/20/21 1943  BP: 122/64 (!) 147/73 115/68 124/72  Pulse: 65 60 62 (!) 59  Resp: 10 10 (!) 7 16  Temp: 97.7 F (36.5 C) 97.7 F (36.5 C) 97.7 F (36.5 C) 97.7 F (36.5 C)  TempSrc: Oral Oral Oral Oral  SpO2: 98% 98% 100% 100%  Weight:      Height:        Intake/Output Summary (Last 24 hours) at 07/20/2021 2304 Last data filed at 07/20/2021 2132 Gross per 24 hour  Intake 1813.89 ml  Output 2650 ml  Net -836.11 ml   Filed Weights   07/18/21 0412 07/19/21 0427 07/20/21 0637  Weight: 53.5 kg 51.1 kg 51.2 kg    Examination:  General exam: Appears calm and comfortable  Respiratory system: diminished air entry at bases, normal resp effort  Cardiovascular system: S1 & S2 heard, RRR. No JVD,  No pedal edema. Gastrointestinal system: Abdomen is nondistended, soft and nontender.  Normal bowel sounds heard. Central nervous system: Alert and oriented. No focal neurological deficits. Extremities: Symmetric 5 x 5 power. Skin: No rashes, lesions or ulcers Psychiatry:  Mood & affect appropriate.     Data Reviewed: I have personally reviewed following labs and imaging studies  CBC: Recent Labs  Lab 07/15/21 0412 07/18/21 0242 07/18/21 1641 07/19/21 0338 07/20/21 0255  WBC 4.2 3.4* 3.4* 3.8* 2.8*  NEUTROABS  --  2.1  --   --   --   HGB 10.9* 10.1* 10.5* 9.5* 9.1*  HCT 32.0* 28.8* 30.2* 28.2* 27.6*  MCV 90.4 90.9 90.7 92.5 93.6  PLT 371 297 319 276 604    Basic Metabolic Panel: Recent Labs  Lab 07/15/21 0412 07/15/21 1231 07/16/21 0609 07/16/21 1237 07/17/21 1229 07/18/21 0242 07/18/21 1641 07/19/21 0338 07/20/21 0255  NA 127*   < > 127*   < > 129* 128* 131* 133* 132*  K 3.8  --  4.3  --   --  4.4 4.3 4.5 4.3  CL 95*  --  91*  --   --  92* 92* 96* 97*  CO2 27  --  29  --   --  28 32 34* 29  GLUCOSE 86  --  93  --   --  79 96 85 101*  BUN 9   --  23  --   --  57* 52* 52* 48*  CREATININE 0.49*  --  0.49*  --   --  0.51* 0.50* 0.52* 0.42*  CALCIUM 8.0*  --  8.4*  --   --  8.4* 8.7* 8.5* 8.2*  MG 1.7  --   --   --   --   --   --   --   --  PHOS 3.1  --   --   --   --   --   --   --  2.4*   < > = values in this interval not displayed.    GFR: Estimated Creatinine Clearance: 59.6 mL/min (A) (by C-G formula based on SCr of 0.42 mg/dL (L)).  Liver Function Tests: Recent Labs  Lab 07/20/21 0255  ALBUMIN 2.3*    CBG: Recent Labs  Lab 07/20/21 0402 07/20/21 0850 07/20/21 1153 07/20/21 1709 07/20/21 2009  GLUCAP 89 106* 80 70 87     Recent Results (from the past 240 hour(s))  Blood culture (routine x 2)     Status: None   Collection Time: 07/12/21  9:35 PM   Specimen: BLOOD  Result Value Ref Range Status   Specimen Description BLOOD SITE NOT SPECIFIED  Final   Special Requests   Final    BOTTLES DRAWN AEROBIC AND ANAEROBIC Blood Culture adequate volume   Culture   Final    NO GROWTH 5 DAYS Performed at [facility], [address], [place], [place] [address]    Report Status 07/17/2021 FINAL  Final  Resp Panel by RT-PCR (Flu A&B, Covid) Nasopharyngeal Swab     Status: None   Collection Time: 07/12/21  9:37 PM   Specimen: Nasopharyngeal Swab; Nasopharyngeal(NP) swabs in vial transport medium  Result Value Ref Range Status   SARS Coronavirus 2 by RT PCR NEGATIVE NEGATIVE Final    Comment: (NOTE) SARS-CoV-2 target nucleic acids are NOT DETECTED.  The SARS-CoV-2 RNA is generally detectable in upper respiratory specimens during the acute phase of infection. The lowest concentration of SARS-CoV-2 viral copies this assay can detect is 138 copies/mL. A negative result does not preclude SARS-Cov-2 infection and should not be used as the sole basis for treatment or other patient management decisions. A negative result may occur with  improper specimen collection/handling, submission of specimen other than  nasopharyngeal swab, presence of viral mutation(s) within the areas targeted by this assay, and inadequate number of viral copies(<138 copies/mL). A negative result must be combined with clinical observations, patient history, and epidemiological information. The expected result is Negative.  Fact Sheet for Patients:  EntrepreneurPulse.com.au  Fact Sheet for Healthcare Providers:  IncredibleEmployment.be  This test is no t yet approved or cleared by the [place] FDA and  has been authorized for detection and/or diagnosis of SARS-CoV-2 by FDA under an Emergency Use Authorization (EUA). This EUA will remain  in effect (meaning this test can be used) for the duration of the COVID-19 declaration under Section 564(b)(1) of the Act, 21 U.S.C.section 360bbb-3(b)(1), unless the authorization is terminated  or revoked sooner.       Influenza A by PCR NEGATIVE NEGATIVE Final   Influenza B by PCR NEGATIVE NEGATIVE Final    Comment: (NOTE) The Xpert Xpress SARS-CoV-2/FLU/RSV plus assay is intended as an aid in the diagnosis of influenza from Nasopharyngeal swab specimens and should not be used as a sole basis for treatment. Nasal washings and aspirates are unacceptable for Xpert Xpress SARS-CoV-2/FLU/RSV testing.  Fact Sheet for Patients: EntrepreneurPulse.com.au  Fact Sheet for Healthcare Providers: IncredibleEmployment.be  This test is not yet approved or cleared by the [place] FDA and has been authorized for detection and/or diagnosis of SARS-CoV-2 by FDA under an Emergency Use Authorization (EUA). This EUA will remain in effect (meaning this test can be used) for the duration of the COVID-19 declaration under Section 564(b)(1) of the Act, 21 U.S.C.  section 360bbb-3(b)(1), unless the authorization is terminated or revoked.  Performed at [facility], [place] [address] [place], [place] [address]    Blood culture (routine x 2)     Status: None   Collection Time: 07/12/21  9:40 PM   Specimen: BLOOD RIGHT FOREARM  Result Value Ref Range Status   Specimen Description BLOOD RIGHT FOREARM  Final   Special Requests   Final    BOTTLES DRAWN AEROBIC AND ANAEROBIC Blood Culture results may not be optimal due to an inadequate volume of blood received in culture bottles   Culture   Final    NO GROWTH 5 DAYS Performed at [facility], [place] [address] [place], [place] [address]    Report Status 07/17/2021 FINAL  Final  MRSA Next Gen by PCR, Nasal     Status: None   Collection Time: 07/17/21  8:44 AM   Specimen: Nasal Mucosa; Nasal Swab  Result Value Ref Range Status   MRSA by PCR Next Gen NOT DETECTED NOT DETECTED Final    Comment: (NOTE) The GeneXpert MRSA Assay (FDA approved for NASAL specimens only), is one component of a comprehensive MRSA colonization surveillance program. It is not intended to diagnose MRSA infection nor to guide or monitor treatment for MRSA infections. Test performance is not FDA approved in patients less than 72 years old. Performed at [facility], [place] [address], [place],  [address]           Radiology Studies: DG Abd 1 View  Result Date: 07/19/2021 CLINICAL DATA:  Diarrhea, general weakness EXAM: ABDOMEN - 1 VIEW COMPARISON:  None. FINDINGS: Nonobstructive pattern of bowel gas. Percutaneous gastrostomy tube projects over the general vicinity of the stomach. No obvious free air in the abdomen on single supine radiograph. Large burden of stool and stool balls throughout the left colon. IMPRESSION: 1. Nonobstructive pattern of bowel gas. Percutaneous gastrostomy tube projects over the general vicinity of the stomach. 2.  Large burden of stool and stool balls throughout the left colon. Electronically Signed   By: Delanna Ahmadi M.D.   On: 07/19/2021 10:55        Scheduled Meds:  atorvastatin  20 mg Per Tube Daily   free water  30 mL Per  Tube Q4H   gabapentin  100 mg Per Tube BID   hydrocortisone  25 mg Rectal BID   levothyroxine  100 mcg Per Tube QAC breakfast   loratadine  10 mg Per Tube Daily   midodrine  5 mg Per Tube TID WC   multivitamin with minerals  1 tablet Per Tube Daily   pantoprazole (PROTONIX) IV  40 mg Intravenous Q12H   urea  30 g Per Tube BID   Continuous Infusions:  sodium chloride 75 mL/hr at 07/20/21 2208   cefTRIAXone (ROCEPHIN)  IV Stopped (07/20/21 1729)   feeding supplement (OSMOLITE 1.5 CAL) 1,000 mL (07/20/21 1712)     LOS: 7 days        Kathie Dike, MD [facility]   To contact the attending provider between 7A-7P or the covering provider during after hours 7P-7A, please log into the web site [email or web address] and access using universal [place] password for that web site. If you do not have the password, please call the hospital operator.  07/20/2021, 11:04 PM

## 2021-07-20 NOTE — Progress Notes (Signed)
Occupational Therapy Treatment Patient Details Name: Alexander Duran MRN: [id number] DOB: [birth year] Today's Date: 07/20/2021   History of present illness Pt is a 76 y/o male presenting on 1/26 with diarrhea.  Admitted with dehydration, complicated by generalized weakness and hyponatremia. Noted recent admission 06/17/21 for covid infection. L sided numbness and hypotension began 1/27, code stroke activated. CT/CTA negative. MRI brain/cervical spine negative for acute abnormalities. 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   OT comments  Patient supine in bed, agreeable to OT session but reports frustrated with situation-- concerned about his bleeding (stool, cough, and nose).  Relayed concerns to MD per pt request, also encouraged pt to speak to MD about these concerns as well.  Patient currently requires min assist for bed mobility, limited sitting EOB upright due to reports of dizziness (BP assessed supine and sitting but 2nd measurement while pt returning back to supine).  Patient demonstrating ability to complete LB ADLs with min assist from bed level, setup for grooming bed level. Encouraged progressing to [facility] elevation to more upright position throughout the day. He reports his main goal is to be able to get home and not be a burden on his wife, discussed options with wc, lateral scoots and compensatory techniques to optimize independence. Will continue to follow and progress as able.    Recommendations for follow up therapy are one component of a multi-disciplinary discharge planning process, led by the attending physician.  Recommendations may be updated based on patient status, additional functional criteria and insurance authorization.    Follow Up Recommendations  Skilled nursing-short term rehab (<3 hours/day) (but pt plans to dc home with hospice care)    Assistance Recommended at Discharge Frequent or constant Supervision/Assistance  Patient  can return home with the following  A lot of help with walking and/or transfers;A lot of help with bathing/dressing/bathroom;Direct supervision/assist for medications management;Direct supervision/assist for financial management;Assist for transportation;Help with stairs or ramp for entrance   Equipment Recommendations  Wheelchair (measurements OT);Wheelchair cushion (measurements OT);Hospital bed    Recommendations for Other Services      Precautions / Restrictions Precautions Precautions: Fall Precaution Comments: g-tube; orthostatic hypotension Restrictions Weight Bearing Restrictions: No       Mobility Bed Mobility Overal bed mobility: Needs Assistance Bed Mobility: Rolling, Supine to Sit, Sit to Supine Rolling: Supervision   Supine to sit: Min assist Sit to supine: Min guard   General bed mobility comments: rolling for hygiene with supervision to L and R, min assist for trunk support to ascend and returned to supine with min guard for safety due to dizziness    Transfers                         Balance Overall balance assessment: Needs assistance Sitting-balance support: No upper extremity supported, Feet supported Sitting balance-Leahy Scale: Fair Sitting balance - Comments: min guard for safety, limited tolerance due to dizziness                                   ADL either performed or assessed with clinical judgement   ADL Overall ADL's : Needs assistance/impaired     Grooming: Set up;Sitting               Lower Body Dressing: Minimal assistance;Bed level       Toileting- Clothing Manipulation and Hygiene: Total assistance;Bed level  Toileting - Clothing Manipulation Details (indicate cue type and reason): incontinent of bowel, bed level hygiene     Functional mobility during ADLs: Minimal assistance;Cueing for safety General ADL Comments: pt feeling dizzy at EOB after approx 3 minutes sitting, BP assessed but unreliable  reading due to pt moving back to supine    Extremity/Trunk Assessment              Vision       Perception     Praxis      Cognition Arousal/Alertness: Awake/alert Behavior During Therapy: Flat affect Overall Cognitive Status: Impaired/Different from baseline Area of Impairment: Memory, Awareness, Problem solving                     Memory: Decreased short-term memory     Awareness: Emergent Problem Solving: Slow processing, Requires verbal cues General Comments: increased time required, pt with flat affect and down about his situation.  Relayed concerns to MD.        Exercises      Shoulder Instructions       General Comments BP supine 138/70s, remained steady with second reading but unclear if accurate as pt reports dizziness and BP completed after he returned supine. Pt reports completing exercises this morning with band provided by PT yesterday.    Pertinent Vitals/ Pain       Pain Assessment Pain Assessment: Faces Faces Pain Scale: No hurt  Home Living                                          Prior Functioning/Environment              Frequency  Min 2X/week        Progress Toward Goals  OT Goals(current goals can now be found in the care plan section)  Progress towards OT goals: Progressing toward goals  Acute Rehab OT Goals Patient Stated Goal: home and not be a burden on my wife OT Goal Formulation: With patient Time For Goal Achievement: 07/27/21 Potential to Achieve Goals: Fair  Plan Frequency remains appropriate;Discharge plan needs to be updated    Co-evaluation                 AM-PAC OT "6 Clicks" Daily Activity     Outcome Measure   Help from another person eating meals?: Total Help from another person taking care of personal grooming?: A Little Help from another person toileting, which includes using toliet, bedpan, or urinal?: Total Help from another person bathing (including washing,  rinsing, drying)?: A Lot Help from another person to put on and taking off regular upper body clothing?: A Little Help from another person to put on and taking off regular lower body clothing?: A Little 6 Click Score: 13    End of Session    OT Visit Diagnosis: Other abnormalities of gait and mobility (R26.89);Muscle weakness (generalized) (M62.81);Pain   Activity Tolerance Treatment limited secondary to medical complications (Comment) (dizziness)   Patient Left in bed;with call bell/phone within reach;with bed alarm set;with family/visitor present   Nurse Communication Mobility status        Time: 1610-9604 OT Time Calculation (min): 28 min  Charges: OT General Charges $OT Visit: 1 Visit OT Treatments $Self Care/Home Management : 23-37 mins  Jolaine Artist, OT Acute Rehabilitation Services Pager [phone] Office [place] 07/20/2021,  11:22 AM

## 2021-07-20 NOTE — Progress Notes (Signed)
Physical Therapy Treatment Patient Details Name: Alexander Duran MRN: [id number] DOB: 23-Nov-1945 Today's Date: 07/20/2021   History of Present Illness Pt is a 76 y/o male presenting on 1/26 with diarrhea.  Admitted with dehydration, complicated by generalized weakness and hyponatremia. Noted recent admission 06/17/21 for covid infection. L sided numbness and hypotension began 1/27, code stroke activated. CT/CTA negative. MRI brain/cervical spine negative for acute abnormalities. 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    PT Comments    Pt declining to sit EOB or get OOB due to already trying to mobilize earlier today with OT. Encouraged pt to consider a w/c and lateral scooting to/from chair if his BP continues to be a limiting factor to mobility, but he continues to decline stating "I will not stay in a chair. I will just stay in a bed instead". Thus, focused session on reviewing HEP provided prior session, with pt performing exercises supine in bed with green theraband resistance. Provided blue theraband and educated pt on progression of there ex. Will continue to follow acutely. Current recommendations remain appropriate.   Recommendations for follow up therapy are one component of a multi-disciplinary discharge planning process, led by the attending physician.  Recommendations may be updated based on patient status, additional functional criteria and insurance authorization.  Follow Up Recommendations  Skilled nursing-short term rehab (<3 hours/day) (plans to d/c home with hospice though)     Assistance Recommended at Discharge Frequent or constant Supervision/Assistance  Patient can return home with the following Two people to help with walking and/or transfers;Two people to help with bathing/dressing/bathroom;A lot of help with walking and/or transfers;A lot of help with bathing/dressing/bathroom;Direct supervision/assist for medications  management;Direct supervision/assist for financial management;Assist for transportation;Help with stairs or ramp for entrance;Assistance with cooking/housework   Equipment Recommendations  Wheelchair (measurements PT);Wheelchair cushion (measurements PT);Hospital bed (hospital bed with overlay mattress to protect skin/further wounds from developing; pt declining hospital bed and w/c though)    Recommendations for Other Services       Precautions / Restrictions Precautions Precautions: Fall Precaution Comments: g-tube; orthostatic hypotension Restrictions Weight Bearing Restrictions: No     Mobility  Bed Mobility               General bed mobility comments: Pt declined to sit up EOB.    Transfers                   General transfer comment: Pt declined to get OOB.    Ambulation/Gait               General Gait Details: Pt declined to get OOB.   Stairs             Wheelchair Mobility    Modified Rankin (Stroke Patients Only)       Balance                                            Cognition Arousal/Alertness: Awake/alert Behavior During Therapy: WFL for tasks assessed/performed Overall Cognitive Status: Impaired/Different from baseline Area of Impairment: Memory, Awareness, Problem solving                     Memory: Decreased short-term memory     Awareness: Emergent Problem Solving: Slow processing, Requires verbal cues General Comments: Increased time to process cues. Needs reminders on  how to perform some ther ex.        Exercises General Exercises - Upper Extremity Shoulder Horizontal ABduction: AROM, Strengthening, Both, 10 reps, Supine, Theraband Theraband Level (Shoulder Horizontal Abduction): Level 3 (Green) Elbow Flexion: AROM, Strengthening, Both, 10 reps, Supine, Theraband Theraband Level (Elbow Flexion): Level 3 (Green) Elbow Extension: AROM, Strengthening, Both, 10 reps, Supine,  Theraband Theraband Level (Elbow Extension): Level 3 (Green) General Exercises - Lower Extremity Quad Sets: AROM, Strengthening, Left, 5 reps, Supine Short Arc Quad: AROM, Strengthening, Both, 5 reps, Supine (green theraband) Heel Slides: AROM, Left, 5 reps, Supine Hip ABduction/ADduction: AROM, Strengthening, Both, 10 reps, Supine (green theraband) Straight Leg Raises: AROM, Strengthening, Both, 5 reps, Supine (green theraband) Other Exercises Other Exercises: Bil UE diagonal theraband resistsance, green band, 5x, supine in bed    General Comments General comments (skin integrity, edema, etc.): encouraged pt to consider a w/c and lateral scooting to/from chair if his BP continues to be a limiting factor to mobility, but he continues to decline stating "I will not stay in a chair. I will just stay in a bed instead"; provided pt with blue theraband for progression      Pertinent Vitals/Pain Pain Assessment Pain Assessment: Faces Faces Pain Scale: No hurt Pain Intervention(s): Monitored during session    Home Living                          Prior Function            PT Goals (current goals can now be found in the care plan section) Acute Rehab PT Goals Patient Stated Goal: to improve PT Goal Formulation: With patient Time For Goal Achievement: 07/27/21 Potential to Achieve Goals: Fair Progress towards PT goals: Not progressing toward goals - comment (orthostatic hypotension limiting pt)    Frequency    Min 3X/week      PT Plan Current plan remains appropriate    Co-evaluation              AM-PAC PT "6 Clicks" Mobility   Outcome Measure  Help needed turning from your back to your side while in a flat bed without using bedrails?: A Little Help needed moving from lying on your back to sitting on the side of a flat bed without using bedrails?: A Little Help needed moving to and from a bed to a chair (including a wheelchair)?: A Lot Help needed standing  up from a chair using your arms (e.g., wheelchair or bedside chair)?: A Little Help needed to walk in hospital room?: Total Help needed climbing 3-5 steps with a railing? : Total 6 Click Score: 13    End of Session   Activity Tolerance: Other (comment) (limited by orthostatic hypotension; pt declining to sit EOB or get OOB due to already doing so earlier with OT) Patient left: in bed;with call bell/phone within reach;with bed alarm set Nurse Communication: Mobility status PT Visit Diagnosis: Other abnormalities of gait and mobility (R26.89);Muscle weakness (generalized) (M62.81);Unsteadiness on feet (R26.81);Difficulty in walking, not elsewhere classified (R26.2)     Time: 4696-2952 PT Time Calculation (min) (ACUTE ONLY): 17 min  Charges:  $Therapeutic Exercise: 8-22 mins                     Moishe Spice, PT, DPT Acute Rehabilitation Services  Pager: [phone] Office: [place] 07/20/2021, 1:51 PM

## 2021-07-20 NOTE — Progress Notes (Signed)
Nutrition Follow-up  DOCUMENTATION CODES:   Severe malnutrition in context of chronic illness  INTERVENTION:   Continue TF via PEG: Osmolite 1.5 at 60 ml/h to provide 2160 kcal, 90 g protein, 1277 ml free water daily.   Free water flushes 30 ml every 4 hours for tube patency.  Continue MVI with minerals daily via tube.  After discharge home, recommend change to bolus feedings: Dillard Essex Standard 1.4 1 carton 4 times per day Provides 1820 kcal, 80 gm protein, 936 ml free water daily. Free water flushes 50 ml before and after each bolus feeding for a total of 1336 ml free water daily.  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  Ongoing   GOAL:   Patient will meet greater than or equal to 90% of their needs  Met with TF  MONITOR:   Labs, Weight trends, TF tolerance, Skin, I & O's  REASON FOR ASSESSMENT:   Consult Enteral/tube feeding initiation and management  ASSESSMENT:   76 y.o. 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  Patient continues to tolerate continuous tube feeding (Osmolite 1.5 at 60 ml/h) without difficulty. Discussed changing to bolus regimen with Dillard Essex standard 1.4, but patient does not want to change it today because the nurses are short staffed and he thinks continuous feedings are easier for them. He is okay with switching to bolus feedings after he returns home. This should be okay, since patient was receiving bolus feedings PTA. Dillard Essex is a plant based formula that is easily tolerated and patient should tolerate without difficulty. 8 ounce containers of  Osmolite 1.5 are on backorder.   Labs reviewed. Na 132, phos 2.4 CBG: [phone]  Medications reviewed and include MVI with minerals, Protonix, Miralax, urea.  Weight 51.2 kg today Admission weight 59.2 kg  Diet Order:   Diet Order             Diet NPO time specified  Diet effective now                   EDUCATION NEEDS:   No education needs have been identified at this time  Skin:  Skin Assessment: Skin Integrity Issues: Skin Integrity Issues:: Stage II Stage II: sacrum  Last BM:  2/3 type 6  Height:   Ht Readings from Last 1 Encounters:  07/14/21 '5\' 6"'  (1.676 m)    Weight:   Wt Readings from Last 1 Encounters:  07/20/21 51.2 kg    Ideal Body Weight:  64.5 kg  BMI:  Body mass index is 18.22 kg/m.  Estimated Nutritional Needs:   Kcal:  1800-2100kcal/day  Protein:  80-100 gm  Fluid:  1.6-1.8L/day    Lucas Mallow RD, LDN, CNSC Please refer to Amion for contact information.

## 2021-07-20 NOTE — Progress Notes (Signed)
[place] 4E[facility] ([facility])    [facility] HLT continues to follow patient for D/C planning. Per MD, he is not medically cleared to D/C and will likely be hospitalized for an additional one-two days. [place] staff updated.    Please call with any questions/concerns.    Thank you for the opportunity to participate in this patient's care.   Buck Mam [facility] Liaison [phone]

## 2021-07-20 NOTE — Progress Notes (Signed)
°   07/20/21 1515  Clinical Encounter Type  Visited With Health care provider;Patient  Visit Type Patient actively dying;Spiritual support;Initial  Referral From Nurse  Consult/Referral To Chaplain  Spiritual Encounters  Spiritual Needs Other (Comment);Emotional;Grief support;Prayer (Assurance)  Stress Factors  Patient Stress Factors Health changes;Major life changes  Family Stress Factors Not reviewed   Thank you for the opportunity to meet with Mr. Perrelli. Mr. Hoffmann shared his faith heritage in the [facility] and life long spiritual journey with me. He spoke of his relationship with God and his daily prayer ritual. Mr. Basnett shared his belief in the Triune God: Father, Son, and [employer]. Mr. Winemiller shared that he has received Jesus Christ as his personal Jeanann Lewandowsky and that our visit has reaffirmed within himself, his personal salvation through [place].  Mr Liebman knows that he is actively dying. Though he would like to return home to be with his wife of twenty-five years he realizes that that is highly unlikely. He shared that he has been her primary care giver for several years. Friends and neighbors have been caring for her during his hospitalization. He said that he has been able to speak with her on the phone each morning and evening. He is assured that she will be alright after his death. He is planning to be transferred to hospice care In the near future.  At his request Mr. Tiffany and I prayed for his comfort, along with God's mercy and grace upon him, his loved ones, and his care givers. Miniya Miguez, M.Min., [phone].

## 2021-07-21 DIAGNOSIS — R197 Diarrhea, unspecified: Secondary | ICD-10-CM | POA: Diagnosis not present

## 2021-07-21 DIAGNOSIS — E860 Dehydration: Secondary | ICD-10-CM | POA: Diagnosis not present

## 2021-07-21 DIAGNOSIS — E871 Hypo-osmolality and hyponatremia: Secondary | ICD-10-CM | POA: Diagnosis not present

## 2021-07-21 DIAGNOSIS — D62 Acute posthemorrhagic anemia: Secondary | ICD-10-CM | POA: Diagnosis not present

## 2021-07-21 DIAGNOSIS — I951 Orthostatic hypotension: Secondary | ICD-10-CM | POA: Diagnosis not present

## 2021-07-21 DIAGNOSIS — K922 Gastrointestinal hemorrhage, unspecified: Secondary | ICD-10-CM | POA: Diagnosis not present

## 2021-07-21 DIAGNOSIS — R531 Weakness: Secondary | ICD-10-CM | POA: Diagnosis not present

## 2021-07-21 LAB — CBC
HCT: 24.3 % — ABNORMAL LOW (ref 39.0–52.0)
Hemoglobin: 8 g/dL — ABNORMAL LOW (ref 13.0–17.0)
MCH: 31.1 pg (ref 26.0–34.0)
MCHC: 32.9 g/dL (ref 30.0–36.0)
MCV: 94.6 fL (ref 80.0–100.0)
Platelets: 208 10*3/uL (ref 150–400)
RBC: 2.57 MIL/uL — ABNORMAL LOW (ref 4.22–5.81)
RDW: 16.7 % — ABNORMAL HIGH (ref 11.5–15.5)
WBC: 2.9 10*3/uL — ABNORMAL LOW (ref 4.0–10.5)
nRBC: 0 % (ref 0.0–0.2)

## 2021-07-21 LAB — RENAL FUNCTION PANEL
Albumin: 3.1 g/dL — ABNORMAL LOW (ref 3.5–5.0)
Anion gap: 5 (ref 5–15)
BUN: 51 mg/dL — ABNORMAL HIGH (ref 8–23)
CO2: 29 mmol/L (ref 22–32)
Calcium: 8.5 mg/dL — ABNORMAL LOW (ref 8.9–10.3)
Chloride: 98 mmol/L (ref 98–111)
Creatinine, Ser: 0.48 mg/dL — ABNORMAL LOW (ref 0.61–1.24)
GFR, Estimated: >60 mL/min (ref >60)
Glucose, Bld: 85 mg/dL (ref 70–99)
Phosphorus: 2.3 mg/dL — ABNORMAL LOW (ref 2.5–4.6)
Potassium: 4.3 mmol/L (ref 3.5–5.1)
Sodium: 132 mmol/L — ABNORMAL LOW (ref 135–145)

## 2021-07-21 LAB — GLUCOSE, CAPILLARY
Glucose-Capillary: 101 mg/dL — ABNORMAL HIGH (ref 70–99)
Glucose-Capillary: 104 mg/dL — ABNORMAL HIGH (ref 70–99)
Glucose-Capillary: 107 mg/dL — ABNORMAL HIGH (ref 70–99)
Glucose-Capillary: 82 mg/dL (ref 70–99)
Glucose-Capillary: 85 mg/dL (ref 70–99)
Glucose-Capillary: 88 mg/dL (ref 70–99)
Glucose-Capillary: 94 mg/dL (ref 70–99)

## 2021-07-21 NOTE — Progress Notes (Signed)
[place] 4E14 [facility] ([facility])    [facility] HLT continues to follow patient for D/C planning. Per MD, he is not medically cleared to D/C at this time. [place] staff updated.    Please call with any questions/concerns.    Thank you for the opportunity to participate in this patient's care.  Buck Mam [facility] Liaison  [phone]

## 2021-07-21 NOTE — Progress Notes (Signed)
PROGRESS NOTE    Alexander Duran  RDE:[id number] DOB: 30-May-1946 DOA: 07/12/2021 PCP: Windy Fast, MD    Chief Complaint  Patient presents with   Diarrhea    Brief Narrative:  Alexander Duran is a 76 y.o. 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  Of note, patient was recently hospitalized starting 06/17/2021 for COVID-19 infection, with associated positive COVID-19 test on the day of admission. On the day of admission, pt had trouble with left arm numbness and a code stroke was called. MRI of the brain was negative for acute stroke, pt has chronic right ICA stenosis.  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  Palliative care consulted in view of his recurrent admissions, increased debility and deconditioning. He wanted to go home initially with hospice but now he would like to go home with comfort measures in place. At the same time, he is also requesting to work with PT/OT and get strong enough to get out of bed and walk before going home.     Assessment & Plan:   Principal Problem:   Dehydration Active Problems:   Hypothyroidism   Diarrhea   Generalized weakness   Acute hyponatremia   HLD (hyperlipidemia)   Pressure injury of skin   Dehydration probably from diarrhea . Pt reports at home he was constipated and he was asked to increased the rate of the tube feeds and take stool softeners and laxatives. Since then he had been having multiple episodes of diarrhea.  He reports continued diarrhea in the hospital Abdominal x-ray done 2/2 shows large stool burden in colon Started on MiraLAX, but  patient is resistant to take this   Generalized weakness with some tingling and numbness in the fingers of left hand and left leg as well as significant dysarthria MRI of the brain is negative for acute intracranial abnormalities or acute stroke. 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 He was seen by neurology who felt that he may have had a small infarct that was not apparent on MRI Recommendations were to continue aspirin and add Plavix for the next 3 weeks We have added gabapentin to help with neuropathic pain.  Pt reports the tingling and numbness improved.  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  BP parameters improved with fluid boluses, currently normotensive CXR this morning showing left lower lobe consolidations. Started the patient on IV rocephin and zithromax. On 1/30 pt started cough with blood tinged  sputum. CT chest showed . Airspace consolidation involving the entire left lower lobe compatible with pneumonia. Tree-in-bud and ill-defined nodular densities in the right lower lobe and left upper lobe, likely infectious/inflammatory. MRSA PCR negative, vancomycin discontinued Currently is not on any supplemental oxygen,  he is being treated with antibiotics with ceftriaxone Unclear if he has post covid pneumonia.  COVID-19 PCR is negative.  Urinalysis is negative for infection.    Hyponatremia Probably initially  secondary to hypovolemia from dehydration in the setting of GI losses in the form of diarrhea. ? A component of SIADH. In view of his left lower lung pneumonia.  Differential include SIADH in the context of recent COVID-19 infection. Continue with fluid restriction, continue with urea and lasix . Sodium remains stable around 133  Appreciate nephrology recommendations and following.  Tsh elevated. Cortisol is wnl  Continue to monitor.  Orthostatic hypotension Patient becomes significantly symptomatic on standing -will continue volume resuscitation -continue midodrine -apply ted dose -cortisol normal  Hypothyroidism TSH high, low free t3, free t4 level normal. Pt was on synthroid 75 mcg daily.  Increased synthroid to 100 mcg daily.    Hyperlipidemia Continue with statin.  Generalized weakness -Patient undergoing PT/OT -Reports that prior to admission he was ambulating with a walker as needed -His wife sometimes helps care for him -his goal is for him to return home with hospice services, but I am concerned he may not be functional enough to do that, since it appears that it is only him and his wife at home and that he was her caregiver prior to admission  Pressure injury present on admission.  Pressure Injury 07/13/21 Sacrum Medial Stage 2 -  Partial thickness loss of dermis presenting as a shallow open injury with a red, pink wound bed without slough. (Active)  07/13/21 0000  Location: Sacrum  Location Orientation: Medial  Staging: Stage 2 -  Partial thickness loss of dermis presenting as a shallow open injury with a red, pink wound bed without slough.  Wound Description (Comments):   Present on Admission: Yes   Wound care consulted.    Nutrition:  Dietary consulted for tube  feeds.    Normocytic anemia Anemia of chronic disease;  May have some element of acute blood loss with GI bleeding Currently hemoglobin 8.0   Hypoglycemia:  - unclear , asymptomatic. On tube feeds. Will continue to monitor.   Goals of care -Seen by palliative care and patient does elect DNR status -He will plan on enrolling in hospice care services upon discharge home -He is currently not on comfort care and wishes to continue to "treat the treatable". -His goal is to return home with his wife with hospice services, although with his debility and weakness, I am not sure if this will be a viable option since it does not seem that there is extra help at home.   DVT prophylaxis: SCDs Code Status: DNR  Family Communication: updated wife over the phone 2/3 Disposition:   Status is: Inpatient  Remains inpatient appropriate because: hyponatremia.        Consultants:  Neurology.(Signed off)  Nephrology (signed off) Palliative care Gastroenterology  Procedures: CTA head and neck.  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 Severe left neural foraminal stenosis at C3-4 and moderate bilateral neural foraminal stenosis at C4-5.  MRI brain:  Chronic right ICA occlusion.   Antimicrobials: Antibiotics Given (last 72 hours)  Date/Time Action Medication Dose Rate   07/19/21 1727 New Bag/Given   cefTRIAXone (ROCEPHIN) 1 g in sodium chloride 0.9 % 100 mL IVPB 1 g 200 mL/hr   07/20/21 1659 New Bag/Given   cefTRIAXone (ROCEPHIN) 1 g in sodium chloride 0.9 % 100 mL IVPB 1 g 200 mL/hr   07/21/21 1533 New Bag/Given   cefTRIAXone (ROCEPHIN) 1 g in sodium chloride 0.9 % 100 mL IVPB 1 g 200 mL/hr       Subjective: He took the miralax today. He has thus far has small amount of stool today. No vomiting. Cough is better. Still feels very weak  Objective: Vitals:    07/21/21 0334 07/21/21 0707 07/21/21 1239 07/21/21 1546  BP: 111/68 132/69 106/61 121/76  Pulse: (!) 56 (!) 58 (!) 52 60  Resp: 15 12 14 14   Temp: 97.6 F (36.4 C) (!) 97.5 F (36.4 C) 97.6 F (36.4 C) 97.8 F (36.6 C)  TempSrc: Oral Oral Oral Oral  SpO2: 99% 98% 99% 99%  Weight: 53.3 kg     Height:        Intake/Output Summary (Last 24 hours) at 07/21/2021 1944 Last data filed at 07/21/2021 1833 Gross per 24 hour  Intake 6166.37 ml  Output 3550 ml  Net 2616.37 ml   Filed Weights   07/19/21 0427 07/20/21 0637 07/21/21 0334  Weight: 51.1 kg 51.2 kg 53.3 kg    Examination:  General exam: Alert, awake, oriented x 3 Respiratory system: Clear to auscultation. Respiratory effort normal. Cardiovascular system:RRR. No murmurs, rubs, gallops. Gastrointestinal system: Abdomen is nondistended, soft and nontender. No organomegaly or masses felt. Normal bowel sounds heard. PEG tube in place Central nervous system: Alert and oriented. No focal neurological deficits. Extremities: No C/C/E, +pedal pulses Skin: No rashes, lesions or ulcers Psychiatry: Judgement and insight appear normal. Mood & affect appropriate.      Data Reviewed: I have personally reviewed following labs and imaging studies  CBC: Recent Labs  Lab 07/18/21 0242 07/18/21 1641 07/19/21 0338 07/20/21 0255 07/21/21 0145  WBC 3.4* 3.4* 3.8* 2.8* 2.9*  NEUTROABS 2.1  --   --   --   --   HGB 10.1* 10.5* 9.5* 9.1* 8.0*  HCT 28.8* 30.2* 28.2* 27.6* 24.3*  MCV 90.9 90.7 92.5 93.6 94.6  PLT 297 319 276 266 948    Basic Metabolic Panel: Recent Labs  Lab 07/15/21 0412 07/15/21 1231 07/18/21 0242 07/18/21 1641 07/19/21 0338 07/20/21 0255 07/21/21 0145  NA 127*   < > 128* 131* 133* 132* 132*  K 3.8   < > 4.4 4.3 4.5 4.3 4.3  CL 95*   < > 92* 92* 96* 97* 98  CO2 27   < > 28 32 34* 29 29  GLUCOSE 86   < > 79 96 85 101* 85  BUN 9   < > 57* 52* 52* 48* 51*  CREATININE 0.49*   < > 0.51* 0.50* 0.52* 0.42* 0.48*   CALCIUM 8.0*   < > 8.4* 8.7* 8.5* 8.2* 8.5*  MG 1.7  --   --   --   --   --   --   PHOS 3.1  --   --   --   --  2.4* 2.3*   < > = values in this interval not displayed.    GFR: Estimated Creatinine Clearance: 62 mL/min (A) (by C-G formula based on SCr of 0.48 mg/dL (L)).  Liver Function Tests: Recent Labs  Lab 07/20/21 0255  07/21/21 0145  ALBUMIN 2.3* 3.1*    CBG: Recent Labs  Lab 07/20/21 2353 07/21/21 0404 07/21/21 0750 07/21/21 1059 07/21/21 1548  GLUCAP 88 104* 107* 82 85     Recent Results (from the past 240 hour(s))  Blood culture (routine x 2)     Status: None   Collection Time: 07/12/21  9:35 PM   Specimen: BLOOD  Result Value Ref Range Status   Specimen Description BLOOD SITE NOT SPECIFIED  Final   Special Requests   Final    BOTTLES DRAWN AEROBIC AND ANAEROBIC Blood Culture adequate volume   Culture   Final    NO GROWTH 5 DAYS Performed at [facility], [address], [place], [place] [address]    Report Status 07/17/2021 FINAL  Final  Resp Panel by RT-PCR (Flu A&B, Covid) Nasopharyngeal Swab     Status: None   Collection Time: 07/12/21  9:37 PM   Specimen: Nasopharyngeal Swab; Nasopharyngeal(NP) swabs in vial transport medium  Result Value Ref Range Status   SARS Coronavirus 2 by RT PCR NEGATIVE NEGATIVE Final    Comment: (NOTE) SARS-CoV-2 target nucleic acids are NOT DETECTED.  The SARS-CoV-2 RNA is generally detectable in upper respiratory specimens during the acute phase of infection. The lowest concentration of SARS-CoV-2 viral copies this assay can detect is 138 copies/mL. A negative result does not preclude SARS-Cov-2 infection and should not be used as the sole basis for treatment or other patient management decisions. A negative result may occur with  improper specimen collection/handling, submission of specimen other than nasopharyngeal swab, presence of viral mutation(s) within the areas targeted by this assay, and inadequate  number of viral copies(<138 copies/mL). A negative result must be combined with clinical observations, patient history, and epidemiological information. The expected result is Negative.  Fact Sheet for Patients:  EntrepreneurPulse.com.au  Fact Sheet for Healthcare Providers:  IncredibleEmployment.be  This test is no t yet approved or cleared by the [place] FDA and  has been authorized for detection and/or diagnosis of SARS-CoV-2 by FDA under an Emergency Use Authorization (EUA). This EUA will remain  in effect (meaning this test can be used) for the duration of the COVID-19 declaration under Section 564(b)(1) of the Act, 21 U.S.C.section 360bbb-3(b)(1), unless the authorization is terminated  or revoked sooner.       Influenza A by PCR NEGATIVE NEGATIVE Final   Influenza B by PCR NEGATIVE NEGATIVE Final    Comment: (NOTE) The Xpert Xpress SARS-CoV-2/FLU/RSV plus assay is intended as an aid in the diagnosis of influenza from Nasopharyngeal swab specimens and should not be used as a sole basis for treatment. Nasal washings and aspirates are unacceptable for Xpert Xpress SARS-CoV-2/FLU/RSV testing.  Fact Sheet for Patients: EntrepreneurPulse.com.au  Fact Sheet for Healthcare Providers: IncredibleEmployment.be  This test is not yet approved or cleared by the [place] FDA and has been authorized for detection and/or diagnosis of SARS-CoV-2 by FDA under an Emergency Use Authorization (EUA). This EUA will remain in effect (meaning this test can be used) for the duration of the COVID-19 declaration under Section 564(b)(1) of the Act, 21 U.S.C. section 360bbb-3(b)(1), unless the authorization is terminated or revoked.  Performed at [facility], [place] [address] [place], [place] [address]   Blood culture (routine x 2)     Status: None   Collection Time: 07/12/21  9:40 PM   Specimen: BLOOD  RIGHT FOREARM  Result Value Ref Range Status   Specimen Description BLOOD  RIGHT FOREARM  Final   Special Requests   Final    BOTTLES DRAWN AEROBIC AND ANAEROBIC Blood Culture results may not be optimal due to an inadequate volume of blood received in culture bottles   Culture   Final    NO GROWTH 5 DAYS Performed at [facility], [place] [address], [place], [place] [address]    Report Status 07/17/2021 FINAL  Final  MRSA Next Gen by PCR, Nasal     Status: None   Collection Time: 07/17/21  8:44 AM   Specimen: Nasal Mucosa; Nasal Swab  Result Value Ref Range Status   MRSA by PCR Next Gen NOT DETECTED NOT DETECTED Final    Comment: (NOTE) The GeneXpert MRSA Assay (FDA approved for NASAL specimens only), is one component of a comprehensive MRSA colonization surveillance program. It is not intended to diagnose MRSA infection nor to guide or monitor treatment for MRSA infections. Test performance is not FDA approved in patients less than 61 years old. Performed at [facility], [place] [address] [place], [place] [address]           Radiology Studies: No results found.      Scheduled Meds:  atorvastatin  20 mg Per Tube Daily   free water  30 mL Per Tube Q4H   gabapentin  100 mg Per Tube BID   hydrocortisone  25 mg Rectal BID   levothyroxine  100 mcg Per Tube QAC breakfast   loratadine  10 mg Per Tube Daily   midodrine  5 mg Per Tube TID WC   multivitamin with minerals  1 tablet Per Tube Daily   pantoprazole (PROTONIX) IV  40 mg Intravenous Q12H   urea  30 g Per Tube BID   Continuous Infusions:  sodium chloride 75 mL/hr at 07/21/21 1822   cefTRIAXone (ROCEPHIN)  IV 1 g (07/21/21 1533)   feeding supplement (OSMOLITE 1.5 CAL) 1,000 mL (07/21/21 1113)     LOS: 8 days        Kathie Dike, MD [facility]   To contact the attending provider between 7A-7P or the covering provider during after hours 7P-7A, please log into the web site [email or web address]  and access using universal [place] password for that web site. If you do not have the password, please call the hospital operator.  07/21/2021, 7:44 PM

## 2021-07-21 NOTE — Progress Notes (Signed)
Patient requested to take PRN MyraLax this evening along with his Suppository  Gave bedtime dose early at pts request and comfort with dayshift RN

## 2021-07-21 NOTE — Progress Notes (Signed)
Chaplain responded to [facility] for prayer, support. Brother is bedside.  Pt notes that chaplain visited yesterday, a visit that was much appreciated. Chaplain offered emotional encouragement and oriented pt and brother to availability of chaplaincy services.    Minus [place], [place] Pager: [phone]   07/21/21 1100  Clinical Encounter Type  Visited With Patient and family together  Visit Type Initial;Spiritual support  Referral From Nurse  Consult/Referral To Chaplain  Stress Factors  Patient Stress Factors Major life changes  Family Stress Factors Not reviewed

## 2021-07-22 DIAGNOSIS — E871 Hypo-osmolality and hyponatremia: Secondary | ICD-10-CM | POA: Diagnosis not present

## 2021-07-22 DIAGNOSIS — R197 Diarrhea, unspecified: Secondary | ICD-10-CM | POA: Diagnosis not present

## 2021-07-22 DIAGNOSIS — E860 Dehydration: Secondary | ICD-10-CM | POA: Diagnosis not present

## 2021-07-22 DIAGNOSIS — R531 Weakness: Secondary | ICD-10-CM | POA: Diagnosis not present

## 2021-07-22 LAB — RENAL FUNCTION PANEL
Albumin: 2.7 g/dL — ABNORMAL LOW (ref 3.5–5.0)
Anion gap: 5 (ref 5–15)
BUN: 47 mg/dL — ABNORMAL HIGH (ref 8–23)
CO2: 30 mmol/L (ref 22–32)
Calcium: 8.2 mg/dL — ABNORMAL LOW (ref 8.9–10.3)
Chloride: 97 mmol/L — ABNORMAL LOW (ref 98–111)
Creatinine, Ser: 0.47 mg/dL — ABNORMAL LOW (ref 0.61–1.24)
GFR, Estimated: >60 mL/min (ref >60)
Glucose, Bld: 99 mg/dL (ref 70–99)
Phosphorus: 2.6 mg/dL (ref 2.5–4.6)
Potassium: 4.3 mmol/L (ref 3.5–5.1)
Sodium: 132 mmol/L — ABNORMAL LOW (ref 135–145)

## 2021-07-22 LAB — GLUCOSE, CAPILLARY
Glucose-Capillary: 69 mg/dL — ABNORMAL LOW (ref 70–99)
Glucose-Capillary: 105 mg/dL — ABNORMAL HIGH (ref 70–99)
Glucose-Capillary: 109 mg/dL — ABNORMAL HIGH (ref 70–99)
Glucose-Capillary: 92 mg/dL (ref 70–99)
Glucose-Capillary: 73 mg/dL (ref 70–99)

## 2021-07-22 LAB — CBC
HCT: 26.6 % — ABNORMAL LOW (ref 39.0–52.0)
Hemoglobin: 8.7 g/dL — ABNORMAL LOW (ref 13.0–17.0)
MCH: 31.4 pg (ref 26.0–34.0)
MCHC: 32.7 g/dL (ref 30.0–36.0)
MCV: 96 fL (ref 80.0–100.0)
Platelets: 207 10*3/uL (ref 150–400)
RBC: 2.77 MIL/uL — ABNORMAL LOW (ref 4.22–5.81)
RDW: 16.7 % — ABNORMAL HIGH (ref 11.5–15.5)
WBC: 3.1 10*3/uL — ABNORMAL LOW (ref 4.0–10.5)
nRBC: 0 % (ref 0.0–0.2)

## 2021-07-22 MED ORDER — FLUDROCORTISONE ACETATE 0.1 MG PO TABS
0.2000 mg | ORAL_TABLET | Freq: Every day | ORAL | Status: DC
  Administered 2021-07-22 – 2021-07-27 (×5): 0.2 mg
  Filled 2021-07-22 (×6): qty 2

## 2021-07-22 MED ORDER — FLUDROCORTISONE 0.1 MG/ML ORAL SUSPENSION: 0.2000 mg | Freq: Every day | ORAL | Status: DC

## 2021-07-22 MED ORDER — MIDODRINE HCL 5 MG PO TABS
10.0000 mg | ORAL_TABLET | Freq: Three times a day (TID) | ORAL | Status: DC
  Administered 2021-07-22 – 2021-07-27 (×12): 10 mg
  Filled 2021-07-22 (×11): qty 2

## 2021-07-22 MED ORDER — MAGNESIUM HYDROXIDE 400 MG/5ML PO SUSP
960.0000 mL | Freq: Once | ORAL | Status: AC
  Administered 2021-07-23: 960 mL via RECTAL
  Filled 2021-07-22: qty 473

## 2021-07-22 NOTE — Progress Notes (Signed)
[place] 4E14 [facility] ([facility])    [facility] HLT continues to follow patient for D/C planning. Per MD, he is not medically cleared for discharge at this time. It is also possible that patient may need to discharge to a facility once cleared.    Please call with any questions/concerns.    Thank you for the opportunity to participate in this patient's care.  Clementeen Hoof, BSN, [employer]  [phone]

## 2021-07-22 NOTE — Progress Notes (Signed)
PROGRESS NOTE    [name]  SEG:[id number] DOB: 05/31/1946 DOA: 07/12/2021 PCP: Windy Fast, MD    Chief Complaint  Patient presents with   Diarrhea    Brief Narrative:  Alexander Duran is a 76 y.o. 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  Of note, patient was recently hospitalized starting 06/17/2021 for COVID-19 infection, with associated positive COVID-19 test on the day of admission. On the day of admission, pt had trouble with left arm numbness and a code stroke was called. MRI of the brain was negative for acute stroke, pt has chronic right ICA stenosis.  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  Palliative care consulted in view of his recurrent admissions, increased debility and deconditioning. He wanted to go home initially with hospice but now he would like to go home with comfort measures in place. At the same time, he is also requesting to work with PT/OT and get strong enough to get out of bed and walk before going home.     Assessment & Plan:   Principal Problem:   Dehydration Active Problems:   Tonsil cancer ([place])   Hypothyroidism   LLL pneumonia   DNR (do not resuscitate)   Protein-calorie malnutrition, severe   Diarrhea   Generalized weakness   Acute hyponatremia   HLD (hyperlipidemia)   Pressure injury of skin   GI bleeding   Acute blood loss anemia   Orthostatic hypotension   Dehydration probably from diarrhea . Pt reports at home he was constipated and he was asked to increased the rate of the tube feeds and take stool softeners and laxatives. Since then he had  been having multiple episodes of diarrhea.  He reports continued diarrhea in the hospital Abdominal x-ray done 2/2 shows large stool burden in colon He is started taking MiraLAX, but has not had significant stool output Will give a smog enema today   Generalized weakness with some tingling and numbness in the fingers of left hand and left leg as well as significant dysarthria MRI of the brain is negative for acute intracranial abnormalities or acute stroke. 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 He was seen by neurology who felt that he may have had a small infarct that was not apparent on MRI Recommendations were to continue aspirin and add Plavix for the next 3 weeks We have added gabapentin to help with neuropathic pain.  Pt reports the tingling and numbness improved.  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  BP parameters improved with fluid boluses, currently normotensive CXR this morning showing left lower lobe consolidations. Started the patient on IV rocephin and zithromax. On 1/30 pt started cough with blood tinged sputum. CT chest showed . Airspace consolidation involving the entire left lower lobe compatible with  pneumonia. Tree-in-bud and ill-defined nodular densities in the right lower lobe and left upper lobe, likely infectious/inflammatory. MRSA PCR negative, vancomycin discontinued Currently is not on any supplemental oxygen, he is being treated with antibiotics with ceftriaxone Unclear if he has post covid pneumonia.  COVID-19 PCR is negative.  Urinalysis is negative for infection. -Overall, he is completed a course of ceftriaxone, is afebrile and is on room air.  Further antibiotics will be discontinued.    Hyponatremia Probably initially  secondary to hypovolemia from dehydration in the setting of GI losses in the form of diarrhea. ? A component of SIADH. In view of his left lower lung pneumonia.  Differential include SIADH in the context of recent COVID-19 infection. Continue with fluid restriction, continue with urea and lasix . Sodium remains stable around 132  Appreciate nephrology recommendations and following.  Tsh elevated. Cortisol is wnl  Continue to monitor.  Orthostatic hypotension Patient becomes significantly symptomatic on standing -He has been adequately volume resuscitated and has been making 2 to 3 L of urine a day -continue midodrine -apply ted dose -cortisol normal -We will give a trial of fludrocortisone  Hypothyroidism TSH high, low free t3, free t4 level normal. Pt was on synthroid 75 mcg daily.  Increased synthroid to 100 mcg daily.    Hyperlipidemia Continue with statin.  Generalized weakness -Patient undergoing PT/OT -Reports that prior to admission he was ambulating with a walker as needed -His wife sometimes helps care for him -his goal is for him to return home with hospice services, but I am concerned he may not be functional enough to do that, since it appears that it is only him and his wife at home and that he was her caregiver prior to admission  Pressure injury present on admission.  Pressure Injury 07/13/21 Sacrum Medial Stage 2 -  Partial  thickness loss of dermis presenting as a shallow open injury with a red, pink wound bed without slough. (Active)  07/13/21 0000  Location: Sacrum  Location Orientation: Medial  Staging: Stage 2 -  Partial thickness loss of dermis presenting as a shallow open injury with a red, pink wound bed without slough.  Wound Description (Comments):   Present on Admission: Yes   Wound care consulted.    Nutrition:  Dietary consulted for tube feeds.    Normocytic anemia Anemia of chronic disease;  May have some element of acute blood loss with GI bleeding Currently hemoglobin 8.7   Hypoglycemia:  - unclear , asymptomatic. On tube feeds. Will continue to monitor.   Goals of care -Seen by palliative care and patient does elect DNR status -He will plan on enrolling in hospice care services upon discharge home -He is currently not on comfort care and wishes to continue to "treat the treatable". -His goal is to return home with his wife with hospice services, although with his debility and weakness, I am not sure if this will be a viable option since it does not seem that there is extra help at home.   DVT prophylaxis: SCDs Code Status: DNR  Family Communication: updated wife over the phone 2/3 Disposition:   Status is: Inpatient  Remains inpatient appropriate because: hyponatremia.        Consultants:  Neurology.(Signed off)  Nephrology (signed off) Palliative care Gastroenterology  Procedures: CTA head and neck.  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 Severe left neural foraminal stenosis at C3-4 and moderate bilateral neural foraminal stenosis at C4-5.  MRI brain:  Chronic right ICA occlusion.   Antimicrobials: Antibiotics Given (last 72 hours)     Date/Time Action Medication Dose Rate   07/20/21 1659 New Bag/Given   cefTRIAXone (ROCEPHIN) 1 g in sodium  chloride 0.9 % 100 mL IVPB 1 g 200 mL/hr   07/21/21 1533 New Bag/Given   cefTRIAXone (ROCEPHIN) 1 g in sodium chloride 0.9 % 100 mL IVPB 1 g 200 mL/hr   07/22/21 1600 New Bag/Given   cefTRIAXone (ROCEPHIN) 1 g in sodium chloride 0.9 % 100 mL IVPB 1 g 200 mL/hr       Subjective: Staff reports that he had a very small amount of brown-colored stool.  He reports that his cough is better and is no longer having any hemoptysis.  No clots from his nose.  He did become dizzy on standing again today with significant orthostatic hypotension  Objective: Vitals:   07/22/21 0753 07/22/21 1150 07/22/21 1641 07/22/21 2030  BP: 122/67 125/71 140/74 136/72  Pulse: (!) 58 62 (!) 58 (!) 54  Resp: 10 16 20 11   Temp: 97.9 F (36.6 C) 98.1 F (36.7 C) 98.4 F (36.9 C) 97.7 F (36.5 C)  TempSrc: Oral Oral Oral Oral  SpO2: 99% 97% 98% 97%  Weight:      Height:        Intake/Output Summary (Last 24 hours) at 07/22/2021 2042 Last data filed at 07/22/2021 2029 Gross per 24 hour  Intake 3282.06 ml  Output 1850 ml  Net 1432.06 ml   Filed Weights   07/20/21 0637 07/21/21 0334 07/22/21 0328  Weight: 51.2 kg 53.3 kg 54.5 kg    Examination:  General exam: Alert, awake, oriented x 3, cachectic Respiratory system: Clear to auscultation. Respiratory effort normal. Cardiovascular system:RRR. No murmurs, rubs, gallops. Gastrointestinal system: Abdomen is nondistended, soft and nontender. No organomegaly or masses felt. Normal bowel sounds heard.  PEG tube in place Central nervous system: Alert and oriented. No focal neurological deficits. Extremities: No C/C/E, +pedal pulses Skin: No rashes, lesions or ulcers Psychiatry: Judgement and insight appear normal. Mood & affect appropriate.       Data Reviewed: I have personally reviewed following labs and imaging studies  CBC: Recent Labs  Lab 07/18/21 0242 07/18/21 1641 07/19/21 0338 07/20/21 0255 07/21/21 0145 07/22/21 0154  WBC 3.4* 3.4* 3.8*  2.8* 2.9* 3.1*  NEUTROABS 2.1  --   --   --   --   --   HGB 10.1* 10.5* 9.5* 9.1* 8.0* 8.7*  HCT 28.8* 30.2* 28.2* 27.6* 24.3* 26.6*  MCV 90.9 90.7 92.5 93.6 94.6 96.0  PLT 297 319 276 266 208 599    Basic Metabolic Panel: Recent Labs  Lab 07/18/21 1641 07/19/21 0338 07/20/21 0255 07/21/21 0145 07/22/21 0154  NA 131* 133* 132* 132* 132*  K 4.3 4.5 4.3 4.3 4.3  CL 92* 96* 97* 98 97*  CO2 32 34* 29 29 30   GLUCOSE 96 85 101* 85 99  BUN 52* 52* 48* 51* 47*  CREATININE 0.50* 0.52* 0.42* 0.48* 0.47*  CALCIUM 8.7* 8.5* 8.2* 8.5* 8.2*  PHOS  --   --  2.4* 2.3* 2.6    GFR: Estimated Creatinine Clearance: 63.4 mL/min (A) (by C-G formula based on SCr of 0.47 mg/dL (L)).  Liver Function Tests: Recent Labs  Lab 07/20/21 0255 07/21/21 0145 07/22/21 0154  ALBUMIN 2.3* 3.1* 2.7*    CBG: Recent Labs  Lab 07/22/21 0331 07/22/21 0852 07/22/21 1148 07/22/21 1637 07/22/21 1958  GLUCAP 69* 105* 109* 92 73     Recent Results (from the past 240 hour(s))  Blood culture (routine x 2)     Status: None   Collection Time: 07/12/21  9:35 PM   Specimen: BLOOD  Result Value Ref Range Status   Specimen Description BLOOD SITE NOT SPECIFIED  Final   Special Requests   Final    BOTTLES DRAWN AEROBIC AND ANAEROBIC Blood Culture adequate volume   Culture   Final    NO GROWTH 5 DAYS Performed at [facility], [address], [place], [place] [address]    Report Status 07/17/2021 FINAL  Final  Resp Panel by RT-PCR (Flu A&B, Covid) Nasopharyngeal Swab     Status: None   Collection Time: 07/12/21  9:37 PM   Specimen: Nasopharyngeal Swab; Nasopharyngeal(NP) swabs in vial transport medium  Result Value Ref Range Status   SARS Coronavirus 2 by RT PCR NEGATIVE NEGATIVE Final    Comment: (NOTE) SARS-CoV-2 target nucleic acids are NOT DETECTED.  The SARS-CoV-2 RNA is generally detectable in upper respiratory specimens during the acute phase of infection. The lowest concentration of  SARS-CoV-2 viral copies this assay can detect is 138 copies/mL. A negative result does not preclude SARS-Cov-2 infection and should not be used as the sole basis for treatment or other patient management decisions. A negative result may occur with  improper specimen collection/handling, submission of specimen other than nasopharyngeal swab, presence of viral mutation(s) within the areas targeted by this assay, and inadequate number of viral copies(<138 copies/mL). A negative result must be combined with clinical observations, patient history, and epidemiological information. The expected result is Negative.  Fact Sheet for Patients:  EntrepreneurPulse.com.au  Fact Sheet for Healthcare Providers:  IncredibleEmployment.be  This test is no t yet approved or cleared by the [place] FDA and  has been authorized for detection and/or diagnosis of SARS-CoV-2 by FDA under an Emergency Use Authorization (EUA). This EUA will remain  in effect (meaning this test can be used) for the duration of the COVID-19 declaration under Section 564(b)(1) of the Act, 21 U.S.C.section 360bbb-3(b)(1), unless the authorization is terminated  or revoked sooner.       Influenza A by PCR NEGATIVE NEGATIVE Final   Influenza B by PCR NEGATIVE NEGATIVE Final    Comment: (NOTE) The Xpert Xpress SARS-CoV-2/FLU/RSV plus assay is intended as an aid in the diagnosis of influenza from Nasopharyngeal swab specimens and should not be used as a sole basis for treatment. Nasal washings and aspirates are unacceptable for Xpert Xpress SARS-CoV-2/FLU/RSV testing.  Fact Sheet for Patients: EntrepreneurPulse.com.au  Fact Sheet for Healthcare Providers: IncredibleEmployment.be  This test is not yet approved or cleared by the [place] FDA and has been authorized for detection and/or diagnosis of SARS-CoV-2 by FDA under an Emergency Use  Authorization (EUA). This EUA will remain in effect (meaning this test can be used) for the duration of the COVID-19 declaration under Section 564(b)(1) of the Act, 21 U.S.C. section 360bbb-3(b)(1), unless the authorization is terminated or revoked.  Performed at [facility], [place] [address], [place], [place] [address]  Blood culture (routine x 2)     Status: None   Collection Time: 07/12/21  9:40 PM   Specimen: BLOOD RIGHT FOREARM  Result Value Ref Range Status   Specimen Description BLOOD RIGHT FOREARM  Final   Special Requests   Final    BOTTLES DRAWN AEROBIC AND ANAEROBIC Blood Culture results may not be optimal due to an inadequate volume of blood received in culture bottles   Culture   Final    NO GROWTH 5 DAYS Performed at [facility], [place] [address] [place], [place] [address]    Report Status 07/17/2021 FINAL  Final  MRSA Next Gen by PCR, Nasal     Status: None   Collection Time: 07/17/21  8:44 AM   Specimen: Nasal Mucosa; Nasal Swab  Result Value Ref Range Status   MRSA by PCR Next Gen NOT DETECTED NOT DETECTED Final    Comment: (NOTE) The GeneXpert MRSA Assay (FDA approved for NASAL specimens only), is one component of a comprehensive MRSA colonization surveillance program. It is not intended to diagnose MRSA infection nor to guide or monitor treatment for MRSA infections. Test performance is not FDA approved in patients less than 14 years old. Performed at [facility], [place] [address], [place], [place] [address]           Radiology Studies: No results found.      Scheduled Meds:  atorvastatin  20 mg Per Tube Daily   fludrocortisone  0.2 mg Per Tube Daily   free water  30 mL Per Tube Q4H   gabapentin  100 mg Per Tube BID   hydrocortisone  25 mg Rectal BID   levothyroxine  100 mcg Per Tube QAC breakfast   loratadine  10 mg Per Tube Daily   midodrine  10 mg Per Tube TID WC   multivitamin with minerals  1 tablet Per Tube Daily    pantoprazole (PROTONIX) IV  40 mg Intravenous Q12H   sorbitol, milk of mag, mineral oil, glycerin (SMOG) enema  960 mL Rectal Once   urea  30 g Per Tube BID   Continuous Infusions:  feeding supplement (OSMOLITE 1.5 CAL) 1,000 mL (07/22/21 2040)     LOS: 9 days        Kathie Dike, MD [facility]   To contact the attending provider between 7A-7P or the covering provider during after hours 7P-7A, please log into the web site [email or web address] and access using universal [place] password for that web site. If you do not have the password, please call the hospital operator.  07/22/2021, 8:42 PM

## 2021-07-22 NOTE — Progress Notes (Signed)
Unable to adequately complete pts enema  Passed on to [employer] RN to complete  During Evening dose of suppository I could feel there was a large semi-hard bowl movement present in the descending colon  If enema unsuccessful a possible fecal disimpaction may be successful  Pt refused to do an evening PT session of standing at the bedside  Pt agreeable and pleasant to treatments but expressing some fatigue

## 2021-07-23 DIAGNOSIS — R531 Weakness: Secondary | ICD-10-CM | POA: Diagnosis not present

## 2021-07-23 DIAGNOSIS — R197 Diarrhea, unspecified: Secondary | ICD-10-CM | POA: Diagnosis not present

## 2021-07-23 DIAGNOSIS — E871 Hypo-osmolality and hyponatremia: Secondary | ICD-10-CM | POA: Diagnosis not present

## 2021-07-23 DIAGNOSIS — E860 Dehydration: Secondary | ICD-10-CM | POA: Diagnosis not present

## 2021-07-23 LAB — RENAL FUNCTION PANEL
Albumin: 2.7 g/dL — ABNORMAL LOW (ref 3.5–5.0)
Anion gap: 6 (ref 5–15)
BUN: 43 mg/dL — ABNORMAL HIGH (ref 8–23)
CO2: 29 mmol/L (ref 22–32)
Calcium: 8.2 mg/dL — ABNORMAL LOW (ref 8.9–10.3)
Chloride: 96 mmol/L — ABNORMAL LOW (ref 98–111)
Creatinine, Ser: 0.39 mg/dL — ABNORMAL LOW (ref 0.61–1.24)
GFR, Estimated: >60 mL/min (ref >60)
Glucose, Bld: 89 mg/dL (ref 70–99)
Phosphorus: 2.8 mg/dL (ref 2.5–4.6)
Potassium: 4 mmol/L (ref 3.5–5.1)
Sodium: 131 mmol/L — ABNORMAL LOW (ref 135–145)

## 2021-07-23 LAB — CBC
HCT: 27 % — ABNORMAL LOW (ref 39.0–52.0)
Hemoglobin: 8.8 g/dL — ABNORMAL LOW (ref 13.0–17.0)
MCH: 30.9 pg (ref 26.0–34.0)
MCHC: 32.6 g/dL (ref 30.0–36.0)
MCV: 94.7 fL (ref 80.0–100.0)
Platelets: 205 10*3/uL (ref 150–400)
RBC: 2.85 MIL/uL — ABNORMAL LOW (ref 4.22–5.81)
RDW: 16.7 % — ABNORMAL HIGH (ref 11.5–15.5)
WBC: 3 10*3/uL — ABNORMAL LOW (ref 4.0–10.5)
nRBC: 0 % (ref 0.0–0.2)

## 2021-07-23 LAB — GLUCOSE, CAPILLARY
Glucose-Capillary: 106 mg/dL — ABNORMAL HIGH (ref 70–99)
Glucose-Capillary: 73 mg/dL (ref 70–99)
Glucose-Capillary: 75 mg/dL (ref 70–99)
Glucose-Capillary: 91 mg/dL (ref 70–99)
Glucose-Capillary: 96 mg/dL (ref 70–99)
Glucose-Capillary: 97 mg/dL (ref 70–99)

## 2021-07-23 LAB — VITAMIN B12: Vitamin B-12: 692 pg/mL (ref 180–914)

## 2021-07-23 LAB — TSH: TSH: 21.212 u[IU]/mL — ABNORMAL HIGH (ref 0.350–4.500)

## 2021-07-23 MED ORDER — LINACLOTIDE 145 MCG PO CAPS
145.0000 ug | ORAL_CAPSULE | Freq: Every day | ORAL | Status: DC
  Filled 2021-07-23: qty 1

## 2021-07-23 MED ORDER — POLYETHYLENE GLYCOL 3350 17 G PO PACK
17.0000 g | PACK | Freq: Two times a day (BID) | ORAL | Status: DC
  Administered 2021-07-23 (×2): 17 g via NASOGASTRIC
  Filled 2021-07-23 (×2): qty 1

## 2021-07-23 MED ORDER — LEVOTHYROXINE SODIUM 100 MCG/5ML IV SOLN
50.0000 ug | Freq: Every day | INTRAVENOUS | Status: DC
  Administered 2021-07-24 – 2021-07-27 (×4): 50 ug via INTRAVENOUS
  Filled 2021-07-23 (×4): qty 5

## 2021-07-23 NOTE — Progress Notes (Signed)
[place] 4E14 [facility] ([facility])    [facility] HLT continues to follow patient for D/C planning. Per MD, he is not medically cleared for discharge at this time. It is also possible that patient may need to discharge to a facility once cleared.    A Please do not hesitate to call with questions.    Thank you,    Farrel Gordon, RN, [place] (listed on [facility] under [facility])     [phone]

## 2021-07-23 NOTE — Progress Notes (Signed)
PROGRESS NOTE    Alexander Duran  OMV:[id number] DOB: 1945-09-09 DOA: 07/12/2021 PCP: Windy Fast, MD    Chief Complaint  Patient presents with   Diarrhea    Brief Narrative:  Alexander Duran is a 76 y.o. 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  Of note, patient was recently hospitalized starting 06/17/2021 for COVID-19 infection, with associated positive COVID-19 test on the day of admission. On the day of admission, pt had trouble with left arm numbness and a code stroke was called. MRI of the brain was negative for acute stroke, pt has chronic right ICA stenosis.  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  Palliative care consulted in view of his recurrent admissions, increased debility and deconditioning. He wanted to go home initially with hospice but now he would like to go home with comfort measures in place. At the same time, he is also requesting to work with PT/OT and get strong enough to get out of bed and walk before going home.     Assessment & Plan:   Principal Problem:   Dehydration Active Problems:   Tonsil cancer ([place])   Hypothyroidism   LLL pneumonia   DNR (do not resuscitate)   Protein-calorie malnutrition, severe   Diarrhea   Generalized weakness   Acute hyponatremia   HLD (hyperlipidemia)   Pressure injury of skin   GI bleeding   Acute blood loss anemia   Orthostatic hypotension   Dehydration probably from diarrhea . Pt reports at home he was constipated and he was asked to increased the rate of the tube feeds and take stool softeners and laxatives. Since then he had  been having multiple episodes of diarrhea.  He reports continued diarrhea in the hospital Abdominal x-ray done 2/2 shows large stool burden in colon He is started taking MiraLAX, but has not had significant stool output He was manually disimpacted on 2/6 We will add Linzess to bowel regimen   Generalized weakness with some tingling and numbness in the fingers of left hand and left leg as well as significant dysarthria MRI of the brain is negative for acute intracranial abnormalities or acute stroke. 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 He was seen by neurology who felt that he may have had a small infarct that was not apparent on MRI Recommendations were to continue aspirin and add Plavix for the next 3 weeks We have added gabapentin to help with neuropathic pain.  Pt reports the tingling and numbness improved.  He does not feel that his dysarthria is back to baseline  GI bleeding -Noted by staff to have "rust colored stools" which was FOBT positive -Patient's aspirin and Plavix have been placed on hold -Hemoglobin has been stable continue to monitor -Started on Protonix twice daily -Seen by GI, EGD was not recommended at this time due to associated risk  -Overall hemoglobin has been stable -start anusol suppositories since he may have an element of hemorrhoids -Stools now appear to be brown, no evidence of gross bleeding.  History of tonsillar cancer status post radiation therapy in the past -G-tube dependent  due to significant dysphagia -Currently on continuous feeding, wishes to transition back to bolus feeding when appropriate  Hypothermia, hypotension, SIRS / Left lower lobe pneumonia Hypothermia improved.  BP parameters improved with fluid boluses, currently normotensive CXR this morning showing left lower lobe consolidations. Started the patient on IV rocephin and zithromax. On 1/30 pt started cough with blood  tinged sputum. CT chest showed . Airspace consolidation involving the entire left lower lobe compatible with pneumonia. Tree-in-bud and ill-defined nodular densities in the right lower lobe and left upper lobe, likely infectious/inflammatory. MRSA PCR negative, vancomycin discontinued Currently is not on any supplemental oxygen, he is being treated with antibiotics with ceftriaxone Unclear if he has post covid pneumonia.  COVID-19 PCR is negative.  Urinalysis is negative for infection. -Overall, he is completed a course of ceftriaxone, is afebrile and is on room air.  Further antibiotics will be discontinued.    Hyponatremia Probably initially  secondary to hypovolemia from dehydration in the setting of GI losses in the form of diarrhea. ? A component of SIADH. In view of his left lower lung pneumonia.  Differential include SIADH in the context of recent COVID-19 infection. Continue with fluid restriction, continue with urea and lasix . Sodium remains stable around 132  Appreciate nephrology recommendations and following.  Tsh elevated. Cortisol is wnl  Continue to monitor.  Orthostatic hypotension Patient becomes significantly symptomatic on standing -He has been adequately volume resuscitated and has been making 2 to 3 L of urine a day -continue midodrine -apply ted dose -cortisol normal -We will give a trial of fludrocortisone  Hypothyroidism TSH high at 14, low free t3, free t4 level normal. Pt was on synthroid 75 mcg daily.  Increased synthroid to 100 mcg daily.  TSH rechecked on 2/6 and further elevated at 21 Since the patient is on continuous tube feeds, this may be impacting the absorption of levothyroxine We will change to IV Synthroid for now.   Hyperlipidemia Continue with statin.  Generalized weakness -Patient undergoing PT/OT -Reports that prior to admission he was ambulating with a walker as needed -His wife sometimes helps care for him -his goal is for him to  return home with hospice services, but I am concerned he may not be functional enough to do that, since it appears that it is only him and his wife at home and that he was her caregiver prior to admission  Pressure injury present on admission.  Pressure Injury 07/13/21 Sacrum Medial Stage 2 -  Partial thickness loss of dermis presenting as a shallow open injury with a red, pink wound bed without slough. (Active)  07/13/21 0000  Location: Sacrum  Location Orientation: Medial  Staging: Stage 2 -  Partial thickness loss of dermis presenting as a shallow open injury with a red, pink wound bed without slough.  Wound Description (Comments):   Present on Admission: Yes   Wound care consulted.    Nutrition:  Dietary consulted for tube feeds.    Normocytic anemia Anemia of chronic disease;  May have some element of acute blood loss with GI bleeding Currently hemoglobin 8.8 and has been stable  Goals of care -Seen by palliative care and patient does elect DNR status -He will plan on enrolling in hospice care services upon discharge home -He is currently not on comfort care and wishes to continue to "treat the treatable". -His goal is to return home with his wife with hospice services, although with his debility and weakness, I am not sure if  this will be a viable option since it does not seem that there is extra help at home. -We will ask palliative to reengage to re address goals of care and help understand expectations after discharge   DVT prophylaxis: SCDs Code Status: DNR  Family Communication: At patient's request, I discussed his care with Brynda Greathouse who is his neighbor Disposition:   Status is: Inpatient  Remains inpatient appropriate because: Orthostasis       Consultants:  Neurology.(Signed off)  Nephrology (signed off) Palliative care Gastroenterology  Procedures: CTA head and neck.  MRI cervical spine Punctate focus of likely chronic hemorrhage in the spinal  cord at Cervical spondylosis and facet arthrosis without spinal stenosis. Severe left neural foraminal stenosis at C3-4 and moderate bilateral neural foraminal stenosis at C4-5.  MRI brain:  Chronic right ICA occlusion.   Antimicrobials: Antibiotics Given (last 72 hours)     Date/Time Action Medication Dose Rate   07/21/21 1533 New Bag/Given   cefTRIAXone (ROCEPHIN) 1 g in sodium chloride 0.9 % 100 mL IVPB 1 g 200 mL/hr   07/22/21 1600 New Bag/Given   cefTRIAXone (ROCEPHIN) 1 g in sodium chloride 0.9 % 100 mL IVPB 1 g 200 mL/hr       Subjective: Patient is frustrated with his lack of improvement.  He did not have a bowel movement yesterday and per records was not able to tolerate enema.  Denies any shortness of breath, cough today.  Objective: Vitals:   07/23/21 0835 07/23/21 1047 07/23/21 1621 07/23/21 2013  BP: 127/74 (!) 143/84 108/73 118/70  Pulse: 67 65 74 65  Resp: 19 15 16 16   Temp: 98 F (36.7 C)  97.7 F (36.5 C) 97.7 F (36.5 C)  TempSrc: Oral  Oral Oral  SpO2: 95% 99% 100% 95%  Weight:      Height:        Intake/Output Summary (Last 24 hours) at 07/23/2021 2101 Last data filed at 07/23/2021 1627 Gross per 24 hour  Intake 1381 ml  Output 625 ml  Net 756 ml   Filed Weights   07/20/21 0637 07/21/21 0334 07/22/21 0328  Weight: 51.2 kg 53.3 kg 54.5 kg    Examination:  General exam: Alert, awake, oriented x 3, cachectic Respiratory system: Clear to auscultation. Respiratory effort normal. Cardiovascular system:RRR. No murmurs, rubs, gallops. Gastrointestinal system: Abdomen is nondistended, soft and nontender. No organomegaly or masses felt. Normal bowel sounds heard.  PEG tube in place Central nervous system: Alert and oriented. No focal neurological deficits. Extremities: No C/C/E, +pedal pulses Skin: No rashes, lesions or ulcers Psychiatry: Judgement and insight appear normal. Mood & affect appropriate.       Data Reviewed: I have personally  reviewed following labs and imaging studies  CBC: Recent Labs  Lab 07/18/21 0242 07/18/21 1641 07/19/21 0338 07/20/21 0255 07/21/21 0145 07/22/21 0154 07/23/21 0249  WBC 3.4*   < > 3.8* 2.8* 2.9* 3.1* 3.0*  NEUTROABS 2.1  --   --   --   --   --   --   HGB 10.1*   < > 9.5* 9.1* 8.0* 8.7* 8.8*  HCT 28.8*   < > 28.2* 27.6* 24.3* 26.6* 27.0*  MCV 90.9   < > 92.5 93.6 94.6 96.0 94.7  PLT 297   < > 276 266 208 207 205   < > = values in this interval not displayed.    Basic Metabolic Panel: Recent Labs  Lab 07/19/21 0338 07/20/21 0255 07/21/21 0145 07/22/21  0154 07/23/21 0249  NA 133* 132* 132* 132* 131*  K 4.5 4.3 4.3 4.3 4.0  CL 96* 97* 98 97* 96*  CO2 34* 29 29 30 29   GLUCOSE 85 101* 85 99 89  BUN 52* 48* 51* 47* 43*  CREATININE 0.52* 0.42* 0.48* 0.47* 0.39*  CALCIUM 8.5* 8.2* 8.5* 8.2* 8.2*  PHOS  --  2.4* 2.3* 2.6 2.8    GFR: Estimated Creatinine Clearance: 63.4 mL/min (A) (by C-G formula based on SCr of 0.39 mg/dL (L)).  Liver Function Tests: Recent Labs  Lab 07/20/21 0255 07/21/21 0145 07/22/21 0154 07/23/21 0249  ALBUMIN 2.3* 3.1* 2.7* 2.7*    CBG: Recent Labs  Lab 07/23/21 0420 07/23/21 0832 07/23/21 1154 07/23/21 1654 07/23/21 2012  GLUCAP 91 75 73 97 106*     Recent Results (from the past 240 hour(s))  MRSA Next Gen by PCR, Nasal     Status: None   Collection Time: 07/17/21  8:44 AM   Specimen: Nasal Mucosa; Nasal Swab  Result Value Ref Range Status   MRSA by PCR Next Gen NOT DETECTED NOT DETECTED Final    Comment: (NOTE) The GeneXpert MRSA Assay (FDA approved for NASAL specimens only), is one component of a comprehensive MRSA colonization surveillance program. It is not intended to diagnose MRSA infection nor to guide or monitor treatment for MRSA infections. Test performance is not FDA approved in patients less than 64 years old. Performed at [facility], [place] [address], [place], [place] [address]           Radiology  Studies: No results found.      Scheduled Meds:  atorvastatin  20 mg Per Tube Daily   fludrocortisone  0.2 mg Per Tube Daily   free water  30 mL Per Tube Q4H   gabapentin  100 mg Per Tube BID   levothyroxine  100 mcg Per Tube QAC breakfast   [START ON 07/24/2021] linaclotide  145 mcg Per Tube QAC breakfast   loratadine  10 mg Per Tube Daily   midodrine  10 mg Per Tube TID WC   multivitamin with minerals  1 tablet Per Tube Daily   pantoprazole (PROTONIX) IV  40 mg Intravenous Q12H   polyethylene glycol  17 g Per NG tube BID   urea  30 g Per Tube BID   Continuous Infusions:  feeding supplement (OSMOLITE 1.5 CAL) 1,000 mL (07/23/21 1124)     LOS: 10 days        Kathie Dike, MD [facility]   To contact the attending provider between 7A-7P or the covering provider during after hours 7P-7A, please log into the web site [email or web address] and access using universal  password for that web site. If you do not have the password, please call the hospital operator.  07/23/2021, 9:01 PM

## 2021-07-23 NOTE — Progress Notes (Signed)
PT Cancellation Note  Patient Details Name: Alexander Duran MRN: [id number] DOB: 1945/06/28   Cancelled Treatment:    Reason Eval/Treat Not Completed: Patient declined, no reason specified Pt declines PT due to having a rough day and pain. Will follow. Pt reports he will try to get up and walk tomorrow. Will follow.   Marguarite Arbour A Calub Tarnow 07/23/2021, 3:16 PM Marisa Severin, PT, DPT Acute Rehabilitation Services Pager [phone] Office [phone]

## 2021-07-23 NOTE — Progress Notes (Signed)
OT Cancellation Note  Patient Details Name: Alexander Duran MRN: [id number] DOB: [birth year]   Cancelled Treatment:    Reason Eval/Treat Not Completed: Other (comment)- pt declined OT at this time due to pain, requests OT to return tomorrow as able.   Jolaine Artist, OT Acute Rehabilitation Services Pager [phone] Office [phone]   Delight Stare 07/23/2021, 1:18 PM

## 2021-07-24 ENCOUNTER — Inpatient Hospital Stay (HOSPITAL_COMMUNITY): Payer: No Typology Code available for payment source

## 2021-07-24 LAB — GLUCOSE, CAPILLARY
Glucose-Capillary: 100 mg/dL — ABNORMAL HIGH (ref 70–99)
Glucose-Capillary: 100 mg/dL — ABNORMAL HIGH (ref 70–99)
Glucose-Capillary: 108 mg/dL — ABNORMAL HIGH (ref 70–99)
Glucose-Capillary: 113 mg/dL — ABNORMAL HIGH (ref 70–99)
Glucose-Capillary: 121 mg/dL — ABNORMAL HIGH (ref 70–99)
Glucose-Capillary: 76 mg/dL (ref 70–99)

## 2021-07-24 LAB — BASIC METABOLIC PANEL
Anion gap: 4 — ABNORMAL LOW (ref 5–15)
BUN: 63 mg/dL — ABNORMAL HIGH (ref 8–23)
CO2: 34 mmol/L — ABNORMAL HIGH (ref 22–32)
Calcium: 8.6 mg/dL — ABNORMAL LOW (ref 8.9–10.3)
Chloride: 97 mmol/L — ABNORMAL LOW (ref 98–111)
Creatinine, Ser: 0.43 mg/dL — ABNORMAL LOW (ref 0.61–1.24)
GFR, Estimated: >60 mL/min (ref >60)
Glucose, Bld: 76 mg/dL (ref 70–99)
Potassium: 3.8 mmol/L (ref 3.5–5.1)
Sodium: 135 mmol/L (ref 135–145)

## 2021-07-24 LAB — URINALYSIS, ROUTINE W REFLEX MICROSCOPIC
Bilirubin Urine: NEGATIVE
Glucose, UA: NEGATIVE mg/dL
Hgb urine dipstick: NEGATIVE
Ketones, ur: NEGATIVE mg/dL
Leukocytes,Ua: NEGATIVE
Nitrite: NEGATIVE
Protein, ur: NEGATIVE mg/dL
Specific Gravity, Urine: 1.019 (ref 1.005–1.030)
pH: 8 (ref 5.0–8.0)

## 2021-07-24 LAB — CBC
HCT: 26.8 % — ABNORMAL LOW (ref 39.0–52.0)
Hemoglobin: 8.9 g/dL — ABNORMAL LOW (ref 13.0–17.0)
MCH: 31.6 pg (ref 26.0–34.0)
MCHC: 33.2 g/dL (ref 30.0–36.0)
MCV: 95 fL (ref 80.0–100.0)
Platelets: 197 10*3/uL (ref 150–400)
RBC: 2.82 MIL/uL — ABNORMAL LOW (ref 4.22–5.81)
RDW: 16.7 % — ABNORMAL HIGH (ref 11.5–15.5)
WBC: 7.5 10*3/uL (ref 4.0–10.5)
nRBC: 0 % (ref 0.0–0.2)

## 2021-07-24 IMAGING — DX DG ABDOMEN 1V
1 series · 2 of 2 positions shown · non-contrast
Comparison: [DATE]

CLINICAL DATA: Constipation

EXAM:
ABDOMEN - 1 VIEW

[Series 1: abdomen · 0.14mm/px · 2 of 2 slices shown]
[im 1/2]
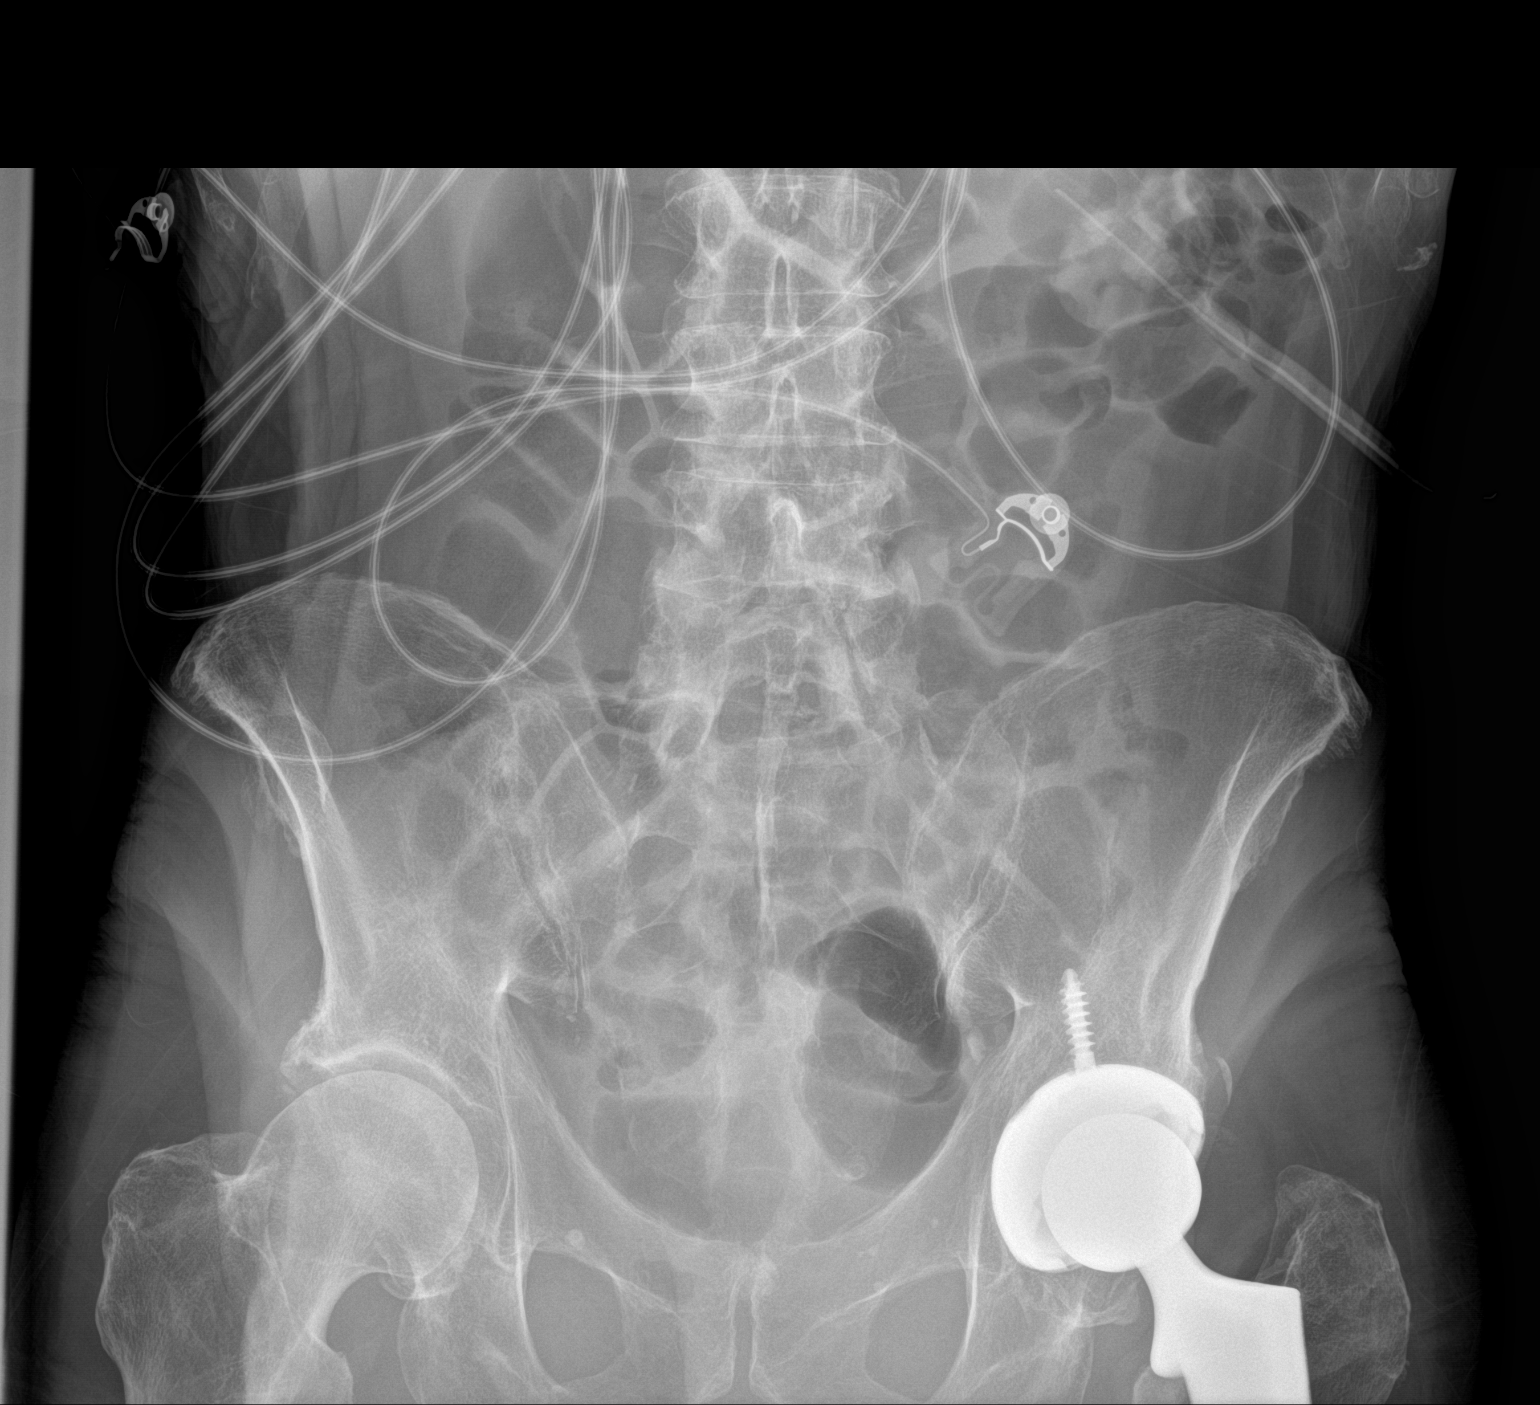
[im 2/2]
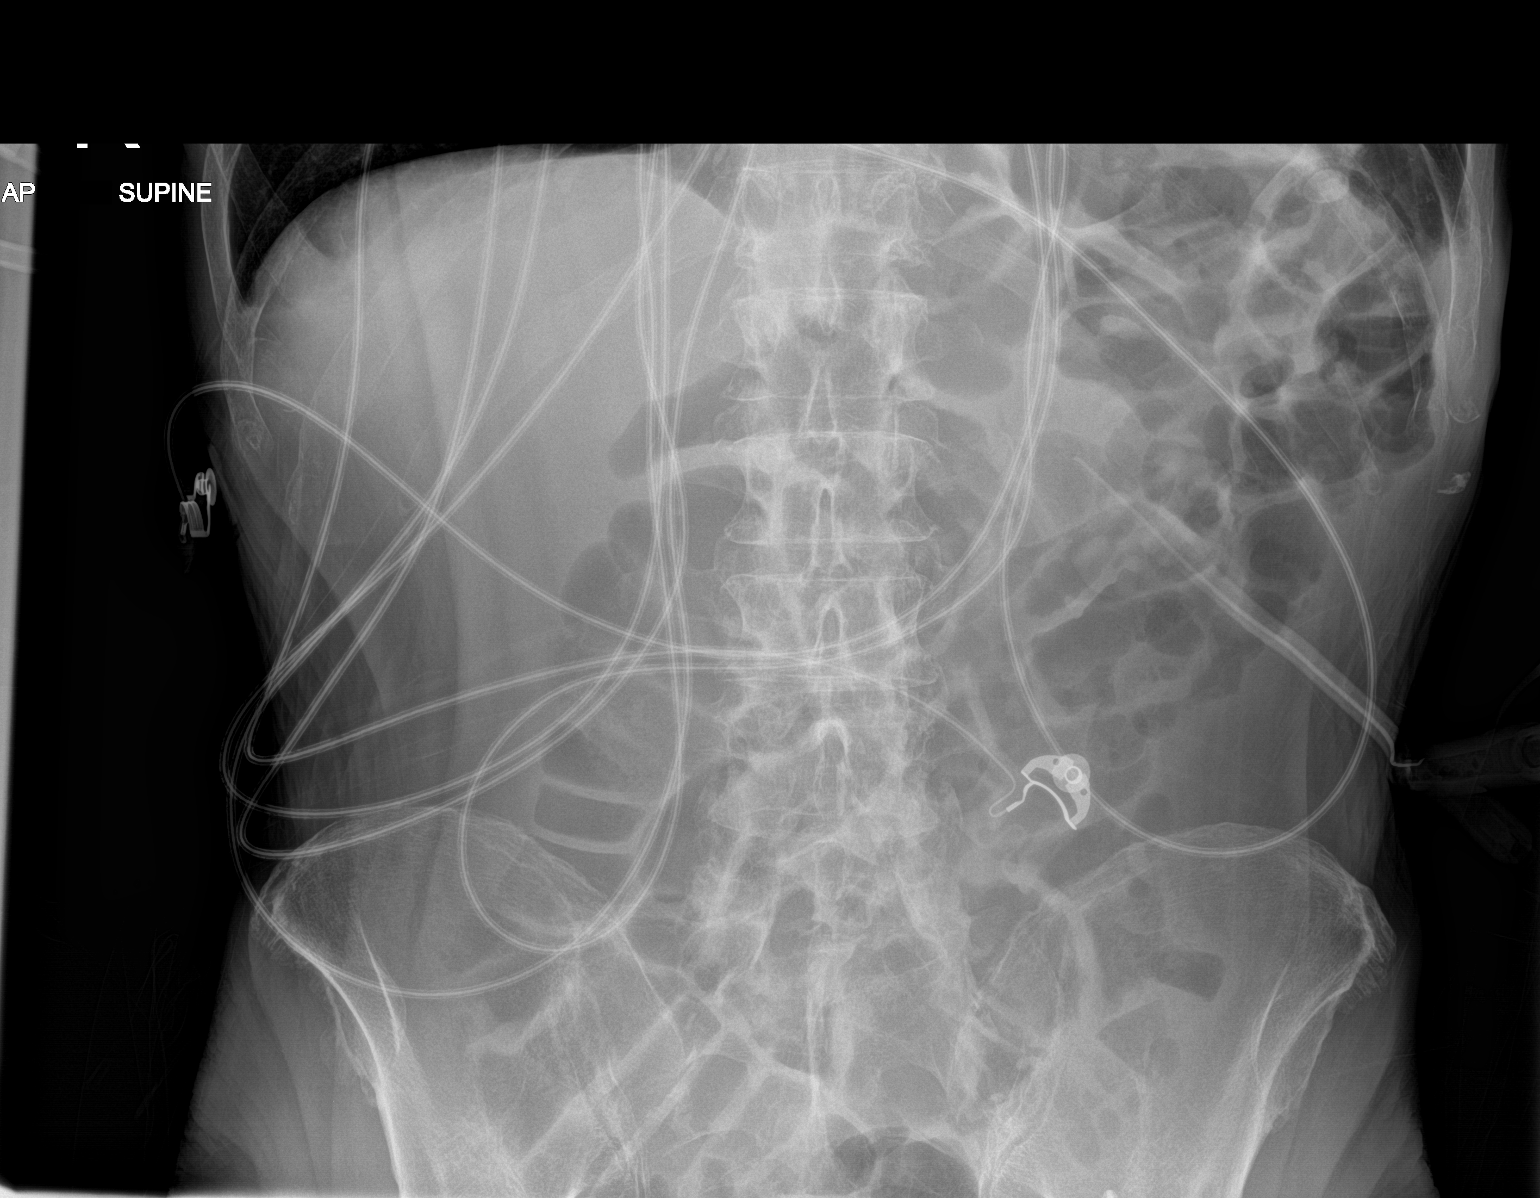

[2 of 2 positions shown; findings below may reference images not displayed]

FINDINGS: Gastrostomy tube over the left upper quadrant. Mild diffuse
increased bowel gas without obstructive pattern. No radiopaque
calculi. Left hip replacement. Decreased stool in the colon and
rectum.
IMPRESSION: Nonobstructed gas pattern.

## 2021-07-24 MED ORDER — ASPIRIN 81 MG PO CHEW
81.0000 mg | CHEWABLE_TABLET | Freq: Every day | ORAL | Status: DC
  Administered 2021-07-25 – 2021-07-27 (×3): 81 mg
  Filled 2021-07-24 (×3): qty 1

## 2021-07-24 MED ORDER — POLYETHYLENE GLYCOL 3350 17 G PO PACK: 17.0000 g | PACK | Freq: Every day | ORAL | Status: DC | PRN

## 2021-07-24 NOTE — Plan of Care (Signed)
  Problem: Clinical Measurements: Goal: Will remain free from infection Outcome: Progressing Goal: Respiratory complications will improve Outcome: Progressing Goal: Cardiovascular complication will be avoided Outcome: Progressing   

## 2021-07-24 NOTE — Progress Notes (Signed)
[place] 4E14 [facility] ([facility])    [facility] HLT continues to follow patient for D/C planning. Per MD, he is not medically cleared for discharge at this time. It is also possible that patient may need to discharge to a facility once cleared.    A Please do not hesitate to call with questions.    Thank you,    Farrel Gordon, RN, [place] (listed on [facility] under [place])     [phone]

## 2021-07-24 NOTE — Progress Notes (Signed)
OT Cancellation Note  Patient Details Name: Alexander Duran MRN: [id number] DOB: May 29, 1946   Cancelled Treatment:    Reason Eval/Treat Not Completed: Patient declined, no reason specified- pt declined session. Voiced "I don't want to talk to you- I need to talk to my doctor." Pt declining participation in OT.  Pts want to care for himself and mobilize do not match his effort. Will follow up and attempt 1 more time before signing off.   Jolaine Artist, [place] Pager [phone] Office [phone]'   Delight Stare 07/24/2021, 2:00 PM

## 2021-07-24 NOTE — Progress Notes (Signed)
PROGRESS NOTE    Alexander Duran  KYH:[id number] DOB: 20-Dec-1945 DOA: 07/12/2021 PCP: Windy Fast, MD    Chief Complaint  Patient presents with   Diarrhea    Brief Narrative:  Alexander Duran is a 76 y.o. 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  Of note, patient was recently hospitalized starting 06/17/2021 for COVID-19 infection, with associated positive COVID-19 test on the day of admission. On the day of admission, pt had trouble with left arm numbness and a code stroke was called. MRI of the brain was negative for acute stroke, pt has chronic right ICA stenosis.  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 he was also found to have to left lung pneumonia and he was started on IV antibiotics.  Palliative care consulted in view of his recurrent admissions, increased debility and deconditioning. He wanted to go home with hospice. At the same time, he is also requesting to work with PT/OT and get strong enough to get out of bed and walk before going home.     Assessment & Plan:   Principal Problem:   Dehydration Active Problems:   Tonsil cancer ([place])   Hypothyroidism   LLL pneumonia   DNR (do not resuscitate)   Protein-calorie malnutrition, severe   Diarrhea   Generalized weakness   Acute hyponatremia   HLD (hyperlipidemia)   Pressure injury of skin   GI bleeding   Acute blood loss anemia   Orthostatic hypotension   Dehydration probably from diarrhea. Subsequently developed constipation Pt reports at home he was constipated and he was asked to increased the rate of the tube feeds and take stool softeners and laxatives. He subsequently developed significant diarrhea which led  him to present to the hospital for dehydration.  Diarrhea did eventually resolve, but he subsequently developed constipation again.   Abdominal x-ray done 2/2 shows large stool burden in colon He was started on MiraLAX, but did not have resolution of constipation Eventually disimpacted on 2/6 and since then reports that diarrhea has restarted. KUB repeated on 2/7 showed decreased stool burden in colon.   Generalized weakness with some tingling and numbness in the fingers of left hand and left leg as well as significant dysarthria MRI of the brain is negative for acute intracranial abnormalities or acute stroke. 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 He was seen by neurology who felt that he may have had a small infarct that was not apparent on MRI Recommendations were to continue aspirin and add Plavix for the next 3 weeks Unfortunately, Plavix had to be discontinued due to development of GI bleeding.  Patient reports bleeding issues with Plavix in the past Discussed restarting antiplatelet therapy since GI bleeding has now resolved.  Patient feels that restarting Plavix at this point would be high risk for bleeding which I agree with.  We will restart him on aspirin and observe We have added gabapentin to help with neuropathic pain.  He does not feel that his dysarthria is back to baseline  Bilateral foot numbness -Patient reported on 2/7, that he had been experiencing intermittent bilateral feet numbness, which was up to his ankles -Did not have any significant worsening weakness, denies paresthesias -  His pulses are equal bilaterally -B12 is not significantly low -Patellar reflexes are present bilaterally -TSH is significantly elevated, unclear if this is leading to neuropathy/myopathy -We will continue to monitor for now and if numbness continues to ascend, may need to [facility] neuro depending on how aggressive patient wants  to be with goals of care.  GI bleeding -Noted by staff to have "rust colored stools" which was FOBT positive -Patient's aspirin and Plavix have been placed on hold -Hemoglobin has been stable continue to monitor -On Protonix twice daily -Seen by GI, EGD was not recommended at this time due to associated high risk  -Overall hemoglobin has been stable -Stools now appear to be brown, no evidence of gross bleeding.  History of tonsillar cancer status post radiation therapy in the past -G-tube dependent since 06/21/2021 due to significant dysphagia -Currently on continuous feeding, wishes to transition back to bolus feeding when appropriate  Hypothermia, hypotension, SIRS / Left lower lobe pneumonia Hypothermia improved.  BP parameters improved with fluid boluses, currently normotensive CXR showed left lower lobe consolidations. Started the patient on IV vancomycin and cefepime. On 1/30 pt started cough with blood tinged sputum. CT chest showed . Airspace consolidation involving the entire left lower lobe compatible with pneumonia. Tree-in-bud and ill-defined nodular densities in the right lower lobe and left upper lobe, likely infectious/inflammatory. MRSA PCR negative, vancomycin discontinued and cefepime de-escalated to ceftriaxone Unclear if he has post covid pneumonia.  COVID-19 PCR is negative.   -Overall, he has completed a course of ceftriaxone, is afebrile and is on room air.    Hyponatremia Probably initially  secondary to hypovolemia from dehydration in the setting of GI losses in the form of diarrhea. ? A component of SIADH. In view of his left lower lung pneumonia.  Differential include SIADH in the context of recent COVID-19 infection. Continue with fluid restriction, continue with urea and lasix . Sodium remains stable around 135 Appreciate nephrology recommendations and following.  Tsh elevated. Cortisol is wnl  Continue to monitor.  Orthostatic hypotension Patient becomes  significantly symptomatic on standing -He has been adequately volume resuscitated and has been making 2 to 3 L of urine a day -continue midodrine -apply ted dose -cortisol normal -We will give a trial of fludrocortisone -This appears to be a significant barrier to discharge home, since it prohibits him from standing and working with therapy.  Hypothyroidism TSH high at 14, low free t3, free t4 level normal. Pt was on synthroid 75 mcg daily.  Increased synthroid to 100 mcg daily.  TSH rechecked on 2/6 and further elevated at 21 Since the patient is on continuous tube feeds, this may be impacting the absorption of levothyroxine We will change to IV Synthroid for now.   Hyperlipidemia Continue with statin.  Generalized weakness -Patient undergoing PT/OT -Reports that prior to admission he was ambulating with a walker as needed -His wife sometimes helps care for him -his goal is for him to return home with hospice services, but I am concerned he may not be functional enough to do that, since it appears that it is only him and his wife at home and that he was her caregiver prior to admission  Pressure injury present on admission.  Pressure Injury 07/13/21 Sacrum Medial Stage 2 -  Partial thickness loss of dermis presenting as a shallow open injury with a red, pink wound bed without slough. (Active)  07/13/21 0000  Location: Sacrum  Location Orientation: Medial  Staging: Stage 2 -  Partial thickness loss of dermis presenting as a shallow open injury with a red, pink wound bed without slough.  Wound Description (Comments):   Present on Admission: Yes   Wound care consulted.    Nutrition:  Dietary consulted for tube feeds.    Normocytic anemia Anemia of chronic disease;  May have some element of acute blood loss with GI bleeding Currently hemoglobin 8.8 and has been stable  Goals of care -Seen by palliative care and patient does elect DNR status -He will plan on enrolling in  hospice care services upon discharge home -He is currently not on comfort care and wishes to continue to "treat the treatable". -His goal is to return home with his wife with hospice services, although with his debility and weakness, I am not sure if this will be a viable option since it does not seem that there is extra help at home. -Palliative reconsulted to re address goals of care and help understand expectations after discharge   DVT prophylaxis: SCDs Code Status: DNR  Family Communication: At patient's request, I discussed his care with Brynda Greathouse who is his neighbor Disposition:   Status is: Inpatient  Remains inpatient appropriate because: Orthostasis       Consultants:  Neurology.(Signed off)  Nephrology (signed off) Palliative care Gastroenterology (signed off)  Procedures: CTA head and neck.  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 Severe left neural foraminal stenosis at C3-4 and moderate bilateral neural foraminal stenosis at C4-5.  MRI brain:  Chronic right ICA occlusion.   Antimicrobials: Antibiotics Given (last 72 hours)     Date/Time Action Medication Dose Rate   07/22/21 1600 New Bag/Given   cefTRIAXone (ROCEPHIN) 1 g in sodium chloride 0.9 % 100 mL IVPB 1 g 200 mL/hr       Subjective: Reports that since bowel movement started yesterday, he has had repeated loose stools.  Also notes persistent urinary output.  Reports some discomfort while passing urine.  Objective: Vitals:   07/24/21 0849 07/24/21 1136 07/24/21 1547 07/24/21 1935  BP: 116/64 124/63 (!) 98/53 (!) 109/59  Pulse: 60 60 62 66  Resp: 12 12 11 12   Temp: 98.3 F (36.8 C) 98 F (36.7 C) 98.3 F (36.8 C) 98.3 F (36.8 C)  TempSrc: Oral Oral Oral Oral  SpO2: 98% 98% 97% 97%  Weight:      Height:        Intake/Output Summary (Last 24 hours) at 07/24/2021 2128 Last  data filed at 07/24/2021 1938 Gross per 24 hour  Intake 2038 ml  Output 700 ml  Net 1338 ml   Filed Weights   07/21/21 0334 07/22/21 0328 07/24/21 0500  Weight: 53.3 kg 54.5 kg 51.6 kg    Examination:  General exam: Alert, awake, oriented x 3, cachectic Respiratory system: Clear to auscultation. Respiratory effort normal. Cardiovascular system:RRR. No murmurs, rubs, gallops. Gastrointestinal system: Abdomen is nondistended, soft and nontender. No organomegaly or masses felt. Normal bowel sounds heard.  PEG tube in place Central nervous system: Alert and oriented.  Patellar reflexes intact bilaterally, strength is equal bilaterally Extremities: No C/C/E, +pedal pulses Skin: No rashes, lesions or ulcers Psychiatry: Judgement and insight appear normal. Mood & affect appropriate.       Data Reviewed: I have personally reviewed following labs and imaging studies  CBC: Recent Labs  Lab 07/18/21 0242 07/18/21 1641 07/20/21 0255 07/21/21 0145 07/22/21 0154 07/23/21 0249  07/24/21 0109  WBC 3.4*   < > 2.8* 2.9* 3.1* 3.0* 7.5  NEUTROABS 2.1  --   --   --   --   --   --   HGB 10.1*   < > 9.1* 8.0* 8.7* 8.8* 8.9*  HCT 28.8*   < > 27.6* 24.3* 26.6* 27.0* 26.8*  MCV 90.9   < > 93.6 94.6 96.0 94.7 95.0  PLT 297   < > 266 208 207 205 197   < > = values in this interval not displayed.    Basic Metabolic Panel: Recent Labs  Lab 07/20/21 0255 07/21/21 0145 07/22/21 0154 07/23/21 0249 07/24/21 0109  NA 132* 132* 132* 131* 135  K 4.3 4.3 4.3 4.0 3.8  CL 97* 98 97* 96* 97*  CO2 29 29 30 29  34*  GLUCOSE 101* 85 99 89 76  BUN 48* 51* 47* 43* 63*  CREATININE 0.42* 0.48* 0.47* 0.39* 0.43*  CALCIUM 8.2* 8.5* 8.2* 8.2* 8.6*  PHOS 2.4* 2.3* 2.6 2.8  --     GFR: Estimated Creatinine Clearance: 60 mL/min (A) (by C-G formula based on SCr of 0.43 mg/dL (L)).  Liver Function Tests: Recent Labs  Lab 07/20/21 0255 07/21/21 0145 07/22/21 0154 07/23/21 0249  ALBUMIN 2.3* 3.1* 2.7*  2.7*    CBG: Recent Labs  Lab 07/24/21 0421 07/24/21 0849 07/24/21 1138 07/24/21 1549 07/24/21 1942  GLUCAP 113* 121* 108* 76 100*     Recent Results (from the past 240 hour(s))  MRSA Next Gen by PCR, Nasal     Status: None   Collection Time: 07/17/21  8:44 AM   Specimen: Nasal Mucosa; Nasal Swab  Result Value Ref Range Status   MRSA by PCR Next Gen NOT DETECTED NOT DETECTED Final    Comment: (NOTE) The GeneXpert MRSA Assay (FDA approved for NASAL specimens only), is one component of a comprehensive MRSA colonization surveillance program. It is not intended to diagnose MRSA infection nor to guide or monitor treatment for MRSA infections. Test performance is not FDA approved in patients less than 76 years old. Performed at [facility], [place] [address] [place],  [address]           Radiology Studies: DG Abd 1 View  Result Date: 07/24/2021 CLINICAL DATA:  Constipation EXAM: ABDOMEN - 1 VIEW COMPARISON:  07/19/2021 FINDINGS: Gastrostomy tube over the left upper quadrant. Mild diffuse increased bowel gas without obstructive pattern. No radiopaque calculi. Left hip replacement. Decreased stool in the colon and rectum. IMPRESSION: Nonobstructed gas pattern. Electronically Signed   By: Donavan Foil M.D.   On: 07/24/2021 15:53        Scheduled Meds:  atorvastatin  20 mg Per Tube Daily   fludrocortisone  0.2 mg Per Tube Daily   free water  30 mL Per Tube Q4H   gabapentin  100 mg Per Tube BID   levothyroxine  50 mcg Intravenous Daily   loratadine  10 mg Per Tube Daily   midodrine  10 mg Per Tube TID WC   multivitamin with minerals  1 tablet Per Tube Daily   pantoprazole (PROTONIX) IV  40 mg Intravenous Q12H   urea  30 g Per Tube BID   Continuous Infusions:  feeding supplement (OSMOLITE 1.5 CAL) 1,000 mL (07/24/21 0618)     LOS: 11 days        Kathie Dike, MD [facility]   To contact the attending provider between 7A-7P or the  covering provider  during after hours 7P-7A, please log into the web site [email or web address] and access using universal [place] password for that web site. If you do not have the password, please call the hospital operator.  07/24/2021, 9:28 PM

## 2021-07-24 NOTE — Progress Notes (Signed)
Patient expressed to RN that he would not take any of his medicine except for his Synthroid until after he was able to speak to his MD.  MD made aware and stated he would arrive shortly to speak to patient.  All needs addressed and call light within reach.

## 2021-07-24 NOTE — Progress Notes (Signed)
PT Cancellation Note  Patient Details Name: REINALDO HELT MRN: [id number] DOB: 1945-08-12   Cancelled Treatment:    Reason Eval/Treat Not Completed: Patient declined, no reason specified "you must not have talked to my nurses before you came in here." "I don't want to talk to you, PT, OT or anyone until I talk to my doctor." "You can get out." Pt continues to refuse working with mobility. Does not seem appropriate for continued trials due to continued refuses despite goals to get home and be able to walk. Verbal goals do not match up with effort with therapy/participation. Already discussed this concern with MD and recommended palliative consult to help better guide POC/disposition. Will check on 1 more time and if pt continues to refuse, will sign off.    Marguarite Arbour A Vonceil [place] 07/24/2021, 1:26 PM Marisa Severin, PT, DPT Acute Rehabilitation Services Pager [phone] Office [phone]

## 2021-07-25 DIAGNOSIS — I951 Orthostatic hypotension: Secondary | ICD-10-CM

## 2021-07-25 LAB — COMPREHENSIVE METABOLIC PANEL
ALT: 45 U/L — ABNORMAL HIGH (ref 0–44)
AST: 52 U/L — ABNORMAL HIGH (ref 15–41)
Albumin: 2.7 g/dL — ABNORMAL LOW (ref 3.5–5.0)
Alkaline Phosphatase: 58 U/L (ref 38–126)
Anion gap: 5 (ref 5–15)
BUN: 48 mg/dL — ABNORMAL HIGH (ref 8–23)
CO2: 33 mmol/L — ABNORMAL HIGH (ref 22–32)
Calcium: 8.8 mg/dL — ABNORMAL LOW (ref 8.9–10.3)
Chloride: 101 mmol/L (ref 98–111)
Creatinine, Ser: 0.53 mg/dL — ABNORMAL LOW (ref 0.61–1.24)
GFR, Estimated: >60 mL/min (ref >60)
Glucose, Bld: 99 mg/dL (ref 70–99)
Potassium: 4.7 mmol/L (ref 3.5–5.1)
Sodium: 139 mmol/L (ref 135–145)
Total Bilirubin: 0.6 mg/dL (ref 0.3–1.2)
Total Protein: 5.1 g/dL — ABNORMAL LOW (ref 6.5–8.1)

## 2021-07-25 LAB — CBC
HCT: 25.5 % — ABNORMAL LOW (ref 39.0–52.0)
Hemoglobin: 8.4 g/dL — ABNORMAL LOW (ref 13.0–17.0)
MCH: 31.6 pg (ref 26.0–34.0)
MCHC: 32.9 g/dL (ref 30.0–36.0)
MCV: 95.9 fL (ref 80.0–100.0)
Platelets: 179 10*3/uL (ref 150–400)
RBC: 2.66 MIL/uL — ABNORMAL LOW (ref 4.22–5.81)
RDW: 17.3 % — ABNORMAL HIGH (ref 11.5–15.5)
WBC: 3.8 10*3/uL — ABNORMAL LOW (ref 4.0–10.5)
nRBC: 0 % (ref 0.0–0.2)

## 2021-07-25 LAB — GLUCOSE, CAPILLARY
Glucose-Capillary: 100 mg/dL — ABNORMAL HIGH (ref 70–99)
Glucose-Capillary: 109 mg/dL — ABNORMAL HIGH (ref 70–99)
Glucose-Capillary: 66 mg/dL — ABNORMAL LOW (ref 70–99)
Glucose-Capillary: 69 mg/dL — ABNORMAL LOW (ref 70–99)
Glucose-Capillary: 79 mg/dL (ref 70–99)
Glucose-Capillary: 83 mg/dL (ref 70–99)
Glucose-Capillary: 98 mg/dL (ref 70–99)

## 2021-07-25 MED ORDER — KATE FARMS STANDARD 1.4 PO LIQD
650.0000 mL | Freq: Two times a day (BID) | ORAL | Status: DC
  Filled 2021-07-25: qty 650

## 2021-07-25 MED ORDER — KATE FARMS STANDARD 1.4 PO LIQD
650.0000 mL | Freq: Two times a day (BID) | ORAL | Status: DC
  Administered 2021-07-26 (×2): 650 mL
  Filled 2021-07-25 (×2): qty 650

## 2021-07-25 NOTE — Progress Notes (Signed)
PROGRESS NOTE    Alexander Duran  VFI:[id number] DOB: 05/30/1946 DOA: 07/12/2021 PCP: Windy Fast, MD    Chief Complaint  Patient presents with   Diarrhea    Brief Narrative:  Alexander Duran is a 76 y.o. 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  Of note, patient was recently hospitalized starting 06/17/2021 for COVID-19 infection, with associated positive COVID-19 test on the day of admission. On the day of admission, pt had trouble with left arm numbness and a code stroke was called. MRI of the brain was negative for acute stroke, pt has chronic right ICA stenosis.  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 He was also found to have to left lung pneumonia and he was started on IV antibiotics.  Palliative care consulted in view of his recurrent admissions, increased debility and deconditioning. He wants to go home with hospice.  He has repeatedly declined SNF admission.  Not consistently participating with therapies.    Assessment & Plan:   Principal Problem:   Dehydration Active Problems:   Generalized weakness   Tonsil cancer ([place])   Hypothyroidism   LLL pneumonia   DNR (do not resuscitate)   Protein-calorie malnutrition, severe   Diarrhea   Acute hyponatremia   HLD (hyperlipidemia)   Pressure injury of skin   GI bleeding   Acute blood loss anemia   Orthostatic hypotension   Dehydration probably from diarrhea. Subsequently developed constipation Pt reports at home he was constipated and he was asked to increased the rate of the tube feeds and take stool softeners and laxatives. He subsequently developed significant diarrhea which led him to present to the hospital for  dehydration.  Diarrhea did eventually resolve, but he subsequently developed constipation again.   Abdominal x-ray done 2/2 shows large stool burden in colon He was started on MiraLAX, but did not have resolution of constipation Eventually disimpacted on 2/6 and since then reports that diarrhea has restarted (?  Overflow incontinence). KUB repeated on 2/7 showed decreased stool burden in colon.  Last BM documented on 2/8.   Generalized weakness with some tingling and numbness in the fingers of left hand and left leg as well as significant dysarthria MRI of the brain is negative for acute intracranial abnormalities or acute stroke. 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 He was seen by neurology who felt that he may have had a small infarct that was not apparent on MRI Recommendations were to continue aspirin and add Plavix for the next 3 weeks Unfortunately, Plavix had to be discontinued due to development of GI bleeding.  Patient reports bleeding issues with Plavix in the past Discussed restarting antiplatelet therapy since GI bleeding has now resolved.  Patient feels that restarting Plavix at this point would be high risk for bleeding and hence was not initiated.  Aspirin was restarted. We have added gabapentin to help with neuropathic pain.  No dysarthria reported today.  Bilateral foot numbness -Patient reported on 2/7, that he had been experiencing intermittent bilateral feet numbness, which was up to his ankles -Did not have any significant worsening weakness, denies paresthesias -His pulses are equal bilaterally -B12 is not significantly low -Patellar reflexes are present bilaterally -  TSH is significantly elevated, unclear if this is leading to neuropathy/myopathy -We will continue to monitor for now and if numbness continues to ascend, may need to [facility] neuro depending on how aggressive patient wants to be with goals  of care. -Did not complain of feet numbness today and actually ambulated multiple times to room with OT this morning and reportedly did well.  GI bleeding -Noted by staff to have "rust colored stools" which was FOBT positive.  Unclear when this occurred. -Patient's Plavix is on hold but aspirin has been resumed.  Patient does not wish to restart Plavix at this time. -Hemoglobin has been stable continue to monitor -On Protonix twice daily -Seen by GI, EGD was not recommended at this time due to associated high risk  -Overall hemoglobin has been stable -Stools now appear to be brown, no evidence of gross bleeding.  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 Started the patient on IV vancomycin and cefepime. On 1/30 pt started cough with blood tinged sputum. CT chest showed . Airspace consolidation involving the entire left lower lobe compatible with pneumonia. Tree-in-bud and ill-defined nodular densities in the right lower lobe and left upper lobe, likely infectious/inflammatory. MRSA PCR negative, vancomycin discontinued and cefepime de-escalated to ceftriaxone Unclear if he has post covid pneumonia.  COVID-19 PCR is negative.   -Overall, he has completed a course of ceftriaxone, is afebrile and is on room air.    Hyponatremia Probably initially secondary to hypovolemia from dehydration in the setting of GI losses in the form of diarrhea. ? A component of SIADH and also due to hypothyroidism. In view of his left lower lung pneumonia, recent COVID-19 infection. Treated with fluid restriction, urea and Lasix.  Hyponatremia has resolved.  Discontinue urea and monitor. Appreciate nephrology  recommendations and following.  Nephrology signed off on 2/1. Tsh elevated. Cortisol is wnl   Orthostatic hypotension Patient becomes significantly symptomatic on standing -He has been adequately volume resuscitated and has been making 2 to 3 L of urine a day -continue midodrine -apply ted dose -cortisol normal -We will give a trial of fludrocortisone -This appears to be a significant barrier to discharge home, since it prohibits him from standing and working with therapy. -However I have not seen with recent orthostatic vital signs documented in flowsheet.  He did ambulate comfortably with OT multiple times in the room today without orthostatic symptoms.  Ordered recheck of orthostatic vitals tomorrow.  Hypothyroidism TSH high at 14, low free t3, free t4 level normal. Pt was on synthroid 75 mcg daily.  Increased synthroid to 100 mcg daily.  TSH rechecked on 2/6 and further elevated at 21 Since the patient is on continuous tube feeds, this may be impacting the absorption of levothyroxine We will change to IV Synthroid for now.  Switch back to 100 mcg daily at time of discharge with recommendations to repeat TSH in 4 weeks.   Hyperlipidemia Continue with statin.  Generalized weakness -Patient undergoing PT/OT -Reports that prior to admission he was ambulating with a walker as needed -His wife sometimes helps care for him.  Patient also reports that he has neighbors who can come in to assist him. -his goal is for him to return home with hospice services, but there was  concern that he may not be functional enough to do that, since it appears that it is only him and his wife at home and that he was her caregiver prior to admission - Patient continues to refuse SNF and indicates that he will return home with his wife and neighbors who can help him.  Encouraged him to participate more consistently and do more with therapies.  After working with OT, he reports that he was done for today and  will not participate with ambulation with nursing.  Counseled again to do more.  Hypoglycemia Noted on CBGs.  Treat per protocol.  Not on insulins  Pressure injury present on admission.  Pressure Injury 07/13/21 Sacrum Medial Stage 2 -  Partial thickness loss of dermis presenting as a shallow open injury with a red, pink wound bed without slough. (Active)  07/13/21 0000  Location: Sacrum  Location Orientation: Medial  Staging: Stage 2 -  Partial thickness loss of dermis presenting as a shallow open injury with a red, pink wound bed without slough.  Wound Description (Comments):   Present on Admission: Yes   Wound care consulted.    Nutrition:  Dietary consulted for tube feeds.    Normocytic anemia Anemia of chronic disease;  May have some element of acute blood loss with GI bleeding Currently hemoglobin 8.8 and has been stable  Goals of care -Seen by palliative care and patient does elect DNR status -He will plan on enrolling in hospice care services upon discharge home -He is currently not on comfort care and wishes to continue to "treat the treatable". -His goal is to return home with his wife with hospice services, although with his debility and weakness, not sure if this will be a viable option since it does not seem that there is extra help at home.  We will continue to see how he performs with therapies over the next couple days. -Palliative reconsulted to re address goals of care and help understand expectations after discharge   DVT prophylaxis: SCDs Code Status: DNR  Family Communication: None at bedside. Disposition:   Status is: Inpatient  Remains inpatient appropriate because: Orthostasis.  Limited ambulation.       Consultants:  Neurology.(Signed off)  Nephrology (signed off) Palliative care Gastroenterology (signed off)  Procedures: CTA head and neck.  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 Severe left neural foraminal stenosis at C3-4 and moderate bilateral neural foraminal stenosis at C4-5.  MRI brain:  Chronic right ICA occlusion.   Antimicrobials: Antibiotics Given (last 72 hours)     None       Subjective: Denies complaints.  Reports feeling tired overall due to frequent disturbance and lack of rest.  As per OT, ambulated up to 4 times across the room and did pretty well.  Patient refuses further mobilization today.  Objective: Vitals:   07/25/21 0445 07/25/21 0730 07/25/21 1219 07/25/21 1507  BP: 140/69 128/67 (!) 168/83 (!) 149/83  Pulse: 68 60 63 65  Resp: 17 19 14 15   Temp: 98.3 F (36.8 C) 98.3 F (36.8 C) 98 F (36.7 C) 98 F (36.7 C)  TempSrc: Oral Oral Oral Oral  SpO2: 93% 96% 96% 96%  Weight: 52.7 kg     Height: 5\' 6"  (1.676 m)       Intake/Output Summary (Last 24 hours) at 07/25/2021 1733 Last data filed at 07/25/2021 1509 Gross per 24 hour  Intake 1455 ml  Output 1000 ml  Net 455 ml   Filed Weights   07/22/21 0328 07/24/21 0500 07/25/21 0445  Weight: 54.5 kg 51.6 kg 52.7 kg    Examination:  General exam: Live male, moderately built, frail and chronically ill looking sitting up comfortably in the reclining chair. Respiratory system: Clear to auscultation. Respiratory effort normal. Cardiovascular system: S1 & S2 heard, RRR. No JVD, murmurs, rubs, gallops or clicks. No pedal edema.  Telemetry personally reviewed: SR-sinus bradycardia in the 50s. Gastrointestinal system: Abdomen is nondistended, soft and nontender. No organomegaly or masses felt. Normal bowel sounds heard. Central nervous system: Alert and oriented. No focal neurological deficits. Extremities: Symmetric 5 x 5 power. Skin: No rashes, lesions or ulcers Psychiatry: Judgement and insight appear somewhat impaired.  Mood & affect appropriate.        Data Reviewed: I have personally reviewed following labs and  imaging studies  CBC: Recent Labs  Lab 07/21/21 0145 07/22/21 0154 07/23/21 0249 07/24/21 0109 07/25/21 0253  WBC 2.9* 3.1* 3.0* 7.5 3.8*  HGB 8.0* 8.7* 8.8* 8.9* 8.4*  HCT 24.3* 26.6* 27.0* 26.8* 25.5*  MCV 94.6 96.0 94.7 95.0 95.9  PLT 208 207 205 197 144    Basic Metabolic Panel: Recent Labs  Lab 07/20/21 0255 07/21/21 0145 07/22/21 0154 07/23/21 0249 07/24/21 0109 07/25/21 0253  NA 132* 132* 132* 131* 135 139  K 4.3 4.3 4.3 4.0 3.8 4.7  CL 97* 98 97* 96* 97* 101  CO2 29 29 30 29  34* 33*  GLUCOSE 101* 85 99 89 76 99  BUN 48* 51* 47* 43* 63* 48*  CREATININE 0.42* 0.48* 0.47* 0.39* 0.43* 0.53*  CALCIUM 8.2* 8.5* 8.2* 8.2* 8.6* 8.8*  PHOS 2.4* 2.3* 2.6 2.8  --   --     GFR: Estimated Creatinine Clearance: 61.3 mL/min (A) (by C-G formula based on SCr of 0.53 mg/dL (L)).  Liver Function Tests: Recent Labs  Lab 07/20/21 0255 07/21/21 0145 07/22/21 0154 07/23/21 0249 07/25/21 0253  AST  --   --   --   --  52*  ALT  --   --   --   --  45*  ALKPHOS  --   --   --   --  58  BILITOT  --   --   --   --  0.6  PROT  --   --   --   --  5.1*  ALBUMIN 2.3* 3.1* 2.7* 2.7* 2.7*    CBG: Recent Labs  Lab 07/25/21 0023 07/25/21 0443 07/25/21 0859 07/25/21 1223 07/25/21 1510  GLUCAP 98 109* 83 69* 66*     Recent Results (from the past 240 hour(s))  MRSA Next Gen by PCR, Nasal     Status: None   Collection Time: 07/17/21  8:44 AM   Specimen: Nasal Mucosa; Nasal Swab  Result Value Ref Range Status   MRSA by PCR Next Gen NOT DETECTED NOT DETECTED Final    Comment: (NOTE) The GeneXpert MRSA Assay (FDA approved for NASAL specimens only), is one component of a comprehensive MRSA colonization surveillance program. It is not intended to diagnose MRSA infection nor to guide or monitor treatment for MRSA infections. Test performance is not FDA approved in patients less than 69 years old. Performed at [facility], [address] [place], [place] [address]            Radiology Studies: DG Abd 1 View  Result Date: 07/24/2021 CLINICAL DATA:  Constipation EXAM: ABDOMEN - 1 VIEW COMPARISON:  07/19/2021 FINDINGS: Gastrostomy tube over the left upper quadrant. Mild diffuse increased bowel gas without obstructive pattern. No radiopaque calculi. Left hip replacement. Decreased stool in the colon and rectum. IMPRESSION: Nonobstructed gas pattern. Electronically Signed   By: Donavan Foil M.D.   On: 07/24/2021 15:53        Scheduled Meds:  aspirin  81 mg Per Tube Daily   atorvastatin  20 mg Per Tube Daily   [START ON 07/26/2021] feeding supplement (KATE [place]ARMS STANDARD 1.4)  650 mL Per Tube BID   fludrocortisone  0.2 mg Per Tube Daily   free water  30 mL Per Tube Q4H   gabapentin  100 mg Per Tube BID   levothyroxine  50 mcg Intravenous Daily   loratadine  10 mg Per Tube Daily   midodrine  10 mg Per Tube TID WC   multivitamin with minerals  1 tablet Per Tube Daily   pantoprazole (PROTONIX) IV  40 mg Intravenous Q12H   urea  30 g Per Tube BID   Continuous Infusions:  feeding supplement (OSMOLITE 1.5 CAL) 1,000 mL (07/25/21 0044)     LOS: 12 days        Vernell Leep, MD [facility]   To contact the attending provider between 7A-7P or the covering provider during after hours 7P-7A, please log into the web site [email or web address] and access using universal [place] password for that web site. If you do not have the password, please call the hospital operator.  07/25/2021, 5:33 PM

## 2021-07-25 NOTE — Progress Notes (Signed)
Nutrition Follow-up  DOCUMENTATION CODES:   Severe malnutrition in context of chronic illness  INTERVENTION:   Change to gravity bolus feedings starting 2/9: Dillard [place] Standard 1.4 2 cartons BID for a total of 4 cartons per day.  Flush with 100 ml free water before and after each bolus feeding. Provides 1820 kcal, 80 gm protein, 1336 ml free water daily.  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  Ongoing   GOAL:   Patient will meet greater than or equal to 90% of their needs  Met with TF  MONITOR:   Labs, Weight trends, TF tolerance, Skin, I & O's  REASON FOR ASSESSMENT:   Consult Enteral/tube feeding initiation and management  ASSESSMENT:   76 y.o. 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  Patient reports doing okay with his tube feedings. Per RN, he is having a lot of loose stools. Patient states this has improved. Patient has been trying to ambulate more, so he can be discharged home. He would like to do bolus/gravity feedings at home as he did PTA. He agreed to switch to bolus feedings with Dillard [place] Standard 1.4 4 cartons per day. He would like to give 2 cartons at a time so he only has to do 2 feedings per day. He would like to transition to bolus/gravity regimen tomorrow morning.  Discussing with Engineer, manufacturing systems whether or not Dillard Essex will be available for patient when he is discharged.   Labs and medications reviewed. CBG: [phone]  Diet Order:   Diet Order             Diet NPO time specified  Diet effective now                   EDUCATION  NEEDS:   No education needs have been identified at this time  Skin:  Skin Assessment: Skin Integrity Issues: Skin Integrity Issues:: Stage II Stage II: sacrum  Last BM:  2/8 type 7  Height:   Ht Readings from Last 1 Encounters:  07/25/21 _0  (1.676 m)    Weight:   Wt Readings from Last 1 Encounters:  07/25/21 52.7 kg    Ideal Body Weight:  64.5 kg  BMI:  Body mass index is 18.75 kg/m.  Estimated Nutritional Needs:   Kcal:  1800-2100kcal/day  Protein:  80-100 gm  Fluid:  1.6-1.8L/day    Lucas Mallow RD, LDN, CNSC Please refer to Amion for contact information.

## 2021-07-25 NOTE — Hospital Course (Addendum)
76 y.o. 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  Of note, patient was recently hospitalized starting 06/17/2021 for COVID-19 infection, with associated positive COVID-19 test on the day of admission. On the day of admission, pt had trouble with left arm numbness and a code stroke was called. MRI of the brain was negative for acute stroke, pt has chronic right ICA stenosis.  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 he was also found to have to left lung pneumonia and he was started on IV antibiotics.  Palliative care consulted in view of his recurrent admissions, increased debility and deconditioning.  He will be discharging home with hospice.  He has worked with therapies in the hospital with improvement.  He ambulated 240 feet with PT today.

## 2021-07-25 NOTE — Progress Notes (Signed)
Occupational Therapy Treatment Patient Details Name: Alexander Duran MRN: [id number] DOB: April 06, 1946 Today's Date: 07/25/2021   History of present illness Pt is a 76 y/o male presenting on 1/26 with diarrhea.  Admitted with dehydration, complicated by generalized weakness and hyponatremia. Noted recent admission 06/17/21 for covid infection. L sided numbness and hypotension began 1/27, code stroke activated. CT/CTA negative. MRI brain/cervical spine negative for acute abnormalities. 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   OT comments  Pt progressing towards acute OT goals. Today, with encouragement from RN prior to session, pt able to walk to/from the bathroom, complete toilet transfer and percire, and walked 4 laps in the room (simulating household distances). Pt needed up to close min guard assist and walker. Pt does need min A with LB dressing. Discussed importance of mobilizing during the day and the need to build his strength back up to be able to safely complete ADLs at home. Updated d/c recommendation to home with [facility] therapy, pt is adamantly refusing ST SNF. Pt may benefit from [facility] aide for bathing and dressing, unclear if he has someone at home who can physically help him with these tasks.    Recommendations for follow up therapy are one component of a multi-disciplinary discharge planning process, led by the attending physician.  Recommendations may be updated based on patient status, additional functional criteria and insurance authorization.    Follow Up Recommendations  Home health OT ; [facility] aide for bathing/dressing assist   Assistance Recommended at Discharge Frequent or constant Supervision/Assistance  Patient can return home with the following  A little help with walking and/or transfers;A little help with bathing/dressing/bathroom;Assistance with cooking/housework;Direct supervision/assist for medications management;Assist for  transportation   Equipment Recommendations  Wheelchair (measurements OT);Wheelchair cushion (measurements OT);Hospital bed    Recommendations for Other Services      Precautions / Restrictions Precautions Precautions: Fall Precaution Comments: g-tube Restrictions Weight Bearing Restrictions: No       Mobility Bed Mobility Overal bed mobility: Needs Assistance Bed Mobility: Rolling, Sidelying to Sit Rolling: Supervision Sidelying to sit: Min guard       General bed mobility comments: Extra time and effort, close guard to steady but no physical assist.    Transfers Overall transfer level: Needs assistance Equipment used: Rolling walker (2 wheels) Transfers: Sit to/from Stand Sit to Stand: Min guard           General transfer comment: close min guard for safety but ultimately no physical assist. to/from EOB, comfort height toilet, and recliner. extra time and effort, BLE weakness noted. utilized walker.     Balance Overall balance assessment: Needs assistance Sitting-balance support: Single extremity supported, Feet supported Sitting balance-Leahy Scale: Fair     Standing balance support: Bilateral upper extremity supported, During functional activity Standing balance-Leahy Scale: Poor Standing balance comment: BUE support in static standing                           ADL either performed or assessed with clinical judgement   ADL Overall ADL's : Needs assistance/impaired                         Toilet Transfer: Min guard;Ambulation;Comfort height toilet;Rolling walker (2 wheels);Grab bars   Toileting- Clothing Manipulation and Hygiene: Min guard;Sitting/lateral lean;Sit to/from stand         General ADL Comments: Pt walked to the bathroom, completed toilet transfer  and pericare as detailed above. Pt then asking to walk in the room, completed 4 laps (household distance). Up in recliner at end of session.    Extremity/Trunk Assessment  Upper Extremity Assessment Upper Extremity Assessment: Generalized weakness   Lower Extremity Assessment Lower Extremity Assessment: Defer to PT evaluation        Vision       Perception     Praxis      Cognition Arousal/Alertness: Awake/alert Behavior During Therapy: WFL for tasks assessed/performed Overall Cognitive Status: No family/caregiver present to determine baseline cognitive functioning Area of Impairment: Memory, Awareness, Problem solving                     Memory: Decreased short-term memory     Awareness: Emergent Problem Solving: Slow processing General Comments: mostly pleasant affect but quick to become irritated/agitated. decreased problem solving.        Exercises      Shoulder Instructions       General Comments      Pertinent Vitals/ Pain       Pain Assessment Pain Assessment: No/denies pain  Home Living                                          Prior Functioning/Environment              Frequency  Min 2X/week        Progress Toward Goals  OT Goals(current goals can now be found in the care plan section)  Progress towards OT goals: Progressing toward goals  Acute Rehab OT Goals Patient Stated Goal: home, reduced interuptions OT Goal Formulation: With patient Time For Goal Achievement: 08/08/21 Potential to Achieve Goals: Fair ADL Goals Pt Will Perform Grooming: with min guard assist;with set-up;sitting;standing Pt Will Perform Upper Body Bathing: with set-up;with supervision;sitting Pt Will Transfer to Toilet: with supervision;ambulating Additional ADL Goal #1: Pt will complete bed mobility at mod I level to prepare for EOB/OOB ADLs  Plan Discharge plan needs to be updated;Frequency remains appropriate    Co-evaluation                 AM-PAC OT "6 Clicks" Daily Activity     Outcome Measure   Help from another person eating meals?: Total (NPO) Help from another person taking care  of personal grooming?: A Little Help from another person toileting, which includes using toliet, bedpan, or urinal?: A Little Help from another person bathing (including washing, rinsing, drying)?: A Little Help from another person to put on and taking off regular upper body clothing?: A Little Help from another person to put on and taking off regular lower body clothing?: A Little 6 Click Score: 16    End of Session Equipment Utilized During Treatment: Rolling walker (2 wheels)  OT Visit Diagnosis: Other abnormalities of gait and mobility (R26.89);Muscle weakness (generalized) (M62.81);Pain   Activity Tolerance Patient tolerated treatment well   Patient Left in chair;with call bell/phone within reach   Nurse Communication Mobility status        Time: 9390-3009 OT Time Calculation (min): 35 min  Charges: OT General Charges $OT Visit: 1 Visit OT Treatments $Self Care/Home Management : 23-37 mins  Tyrone Schimke, OT Acute Rehabilitation Services Office: [phone]   Alexander Duran 07/25/2021, 12:27 PM

## 2021-07-26 LAB — CBC
HCT: 26.7 % — ABNORMAL LOW (ref 39.0–52.0)
Hemoglobin: 8.9 g/dL — ABNORMAL LOW (ref 13.0–17.0)
MCH: 32.2 pg (ref 26.0–34.0)
MCHC: 33.3 g/dL (ref 30.0–36.0)
MCV: 96.7 fL (ref 80.0–100.0)
Platelets: 175 10*3/uL (ref 150–400)
RBC: 2.76 MIL/uL — ABNORMAL LOW (ref 4.22–5.81)
RDW: 17.2 % — ABNORMAL HIGH (ref 11.5–15.5)
WBC: 3.5 10*3/uL — ABNORMAL LOW (ref 4.0–10.5)
nRBC: 0 % (ref 0.0–0.2)

## 2021-07-26 LAB — GLUCOSE, CAPILLARY
Glucose-Capillary: 105 mg/dL — ABNORMAL HIGH (ref 70–99)
Glucose-Capillary: 119 mg/dL — ABNORMAL HIGH (ref 70–99)
Glucose-Capillary: 70 mg/dL (ref 70–99)
Glucose-Capillary: 93 mg/dL (ref 70–99)
Glucose-Capillary: 93 mg/dL (ref 70–99)
Glucose-Capillary: 97 mg/dL (ref 70–99)

## 2021-07-26 LAB — COMPREHENSIVE METABOLIC PANEL
ALT: 66 U/L — ABNORMAL HIGH (ref 0–44)
AST: 78 U/L — ABNORMAL HIGH (ref 15–41)
Albumin: 2.9 g/dL — ABNORMAL LOW (ref 3.5–5.0)
Alkaline Phosphatase: 60 U/L (ref 38–126)
Anion gap: 8 (ref 5–15)
BUN: 34 mg/dL — ABNORMAL HIGH (ref 8–23)
CO2: 30 mmol/L (ref 22–32)
Calcium: 8.7 mg/dL — ABNORMAL LOW (ref 8.9–10.3)
Chloride: 101 mmol/L (ref 98–111)
Creatinine, Ser: 0.43 mg/dL — ABNORMAL LOW (ref 0.61–1.24)
GFR, Estimated: >60 mL/min (ref >60)
Glucose, Bld: 109 mg/dL — ABNORMAL HIGH (ref 70–99)
Potassium: 3.9 mmol/L (ref 3.5–5.1)
Sodium: 139 mmol/L (ref 135–145)
Total Bilirubin: 0.5 mg/dL (ref 0.3–1.2)
Total Protein: 5.6 g/dL — ABNORMAL LOW (ref 6.5–8.1)

## 2021-07-26 MED ORDER — FREE WATER
120.0000 mL | Freq: Four times a day (QID) | Status: DC
  Administered 2021-07-26 – 2021-07-27 (×5): 120 mL

## 2021-07-26 MED ORDER — KATE FARMS STANDARD 1.4 PO LIQD
325.0000 mL | Freq: Four times a day (QID) | ORAL | Status: DC
  Administered 2021-07-26 – 2021-07-27 (×5): 325 mL
  Filled 2021-07-26 (×6): qty 325

## 2021-07-26 MED ORDER — FREE WATER: 120.0000 mL | Freq: Four times a day (QID) | Status: DC

## 2021-07-26 NOTE — Progress Notes (Signed)
[place] 4E[facility] ([facility])    [facility] HLT continues to follow patient for D/C planning.   [place] [facility] will cover his [facility] [place] tube feeding.   A Please do not hesitate to call with questions.    Thank you,    Farrel Gordon, RN, [place] (listed on [facility] under [place])     [phone]

## 2021-07-26 NOTE — Progress Notes (Signed)
PROGRESS NOTE    Alexander Duran  EXN:[id number] DOB: 14-Jun-1946 DOA: 07/12/2021 PCP: Windy Fast, MD    Chief Complaint  Patient presents with   Diarrhea    Brief Narrative:  Alexander Duran is a 76 y.o. 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  Of note, patient was recently hospitalized starting 06/17/2021 for COVID-19 infection, with associated positive COVID-19 test on the day of admission. On the day of admission, pt had trouble with left arm numbness and a code stroke was called. MRI of the brain was negative for acute stroke, pt has chronic right ICA stenosis.  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 He was also found to have to left lung pneumonia and he was started on IV antibiotics.  Palliative care consulted in view of his recurrent admissions, increased debility and deconditioning. He wants to go home with hospice.  He has repeatedly declined SNF admission.  Not consistently participating with therapies.    Assessment & Plan:   Principal Problem:   Dehydration Active Problems:   Generalized weakness   Tonsil cancer ([place])   Hypothyroidism   LLL pneumonia   DNR (do not resuscitate)   Protein-calorie malnutrition, severe   Diarrhea   Acute hyponatremia   HLD (hyperlipidemia)   Pressure injury of skin   GI bleeding   Acute blood loss anemia   Orthostatic hypotension   Dehydration probably from diarrhea. Subsequently developed constipation Pt reports at home he was constipated and he was asked to increased the rate of the tube feeds and take stool softeners and laxatives. He subsequently developed significant diarrhea which led him to present to the hospital for  dehydration.  Diarrhea did eventually resolve, but he subsequently developed constipation again.   Abdominal x-ray done 2/2 shows large stool burden in colon He was started on MiraLAX, but did not have resolution of constipation Eventually disimpacted on 2/6 and since then reports that diarrhea has restarted (?  Overflow incontinence). KUB repeated on 2/7 showed decreased stool burden in colon.  Last BM documented on 2/8.   Generalized weakness with some tingling and numbness in the fingers of left hand and left leg as well as significant dysarthria MRI of the brain is negative for acute intracranial abnormalities or acute stroke. 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 He was seen by neurology who felt that he may have had a small infarct that was not apparent on MRI Recommendations were to continue aspirin and add Plavix for the next 3 weeks Unfortunately, Plavix had to be discontinued due to development of GI bleeding.  Patient reports bleeding issues with Plavix in the past Discussed restarting antiplatelet therapy since GI bleeding has now resolved.  Patient feels that restarting Plavix at this point would be high risk for bleeding and hence was not initiated.  Aspirin was restarted. We have added gabapentin to help with neuropathic pain.  No dysarthria reported today. Ambulated 210 feet with PT today.  Bilateral foot numbness -Patient reported on 2/7, that he had been experiencing intermittent bilateral feet numbness, which was up to his ankles -Did not have any significant worsening weakness, denies paresthesias -His pulses are equal bilaterally -B12 is not significantly  low -Patellar reflexes are present bilaterally -TSH is significantly elevated, unclear if this is leading to neuropathy/myopathy -We will continue to monitor for now and if numbness continues to ascend, may need to [facility] neuro depending on how  aggressive patient wants to be with goals of care. -Did not complain of feet numbness today and actually ambulated multiple times to room with OT this morning and reportedly did well.  GI bleeding -Noted by staff to have "rust colored stools" which was FOBT positive.  Unclear when this occurred. -Patient's Plavix is on hold but aspirin has been resumed.  Patient does not wish to restart Plavix at this time. -Hemoglobin has been stable continue to monitor -On Protonix twice daily -Seen by GI, EGD was not recommended at this time due to associated high risk  -Overall hemoglobin has been stable -Stools now appear to be brown, no evidence of gross bleeding.  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 Started the patient on IV vancomycin and cefepime. On 1/30 pt started cough with blood tinged sputum. CT chest showed . Airspace consolidation involving the entire left lower lobe compatible with pneumonia. Tree-in-bud and ill-defined nodular densities in the right lower lobe and left upper lobe, likely infectious/inflammatory. MRSA PCR negative, vancomycin discontinued and cefepime de-escalated to ceftriaxone Unclear if he has post covid pneumonia.  COVID-19 PCR is negative.   -Overall, he has completed a course of ceftriaxone, is afebrile and is on room air.    Hyponatremia Probably initially secondary to hypovolemia from dehydration in the setting of GI losses in the form of diarrhea. ? A component of SIADH and also due to hypothyroidism. In view of his left lower lung pneumonia, recent COVID-19 infection. Treated with fluid restriction, urea and Lasix.  Hyponatremia has resolved.  Discontinue urea  and monitor. Appreciate nephrology recommendations and following.  Nephrology signed off on 2/1. Tsh elevated. Cortisol is wnl   Orthostatic hypotension Patient becomes significantly symptomatic on standing -He has been adequately volume resuscitated and has been making 2 to 3 L of urine a day -continue midodrine -apply ted dose -cortisol normal -We will give a trial of fludrocortisone -This appears to be a significant barrier to discharge home, since it prohibits him from standing and working with therapy. -Patient significantly orthostatic by vital signs but asymptomatic of dizziness.  Yet to get bilateral TED hoses.  Should be able to go home with clear instructions regarding gradual transition from supine/sitting to upright position at home.  Hypothyroidism TSH high at 14, low free t3, free t4 level normal. Pt was on synthroid 75 mcg daily.  Increased synthroid to 100 mcg daily.  TSH rechecked on 2/6 and further elevated at 21 Since the patient is on continuous tube feeds, this may be impacting the absorption of levothyroxine We will change to IV Synthroid for now.  Switch back to 100 mcg daily at time of discharge with recommendations to repeat TSH in 4 weeks.   Hyperlipidemia Continue with statin.  Generalized weakness -Patient undergoing PT/OT -Reports that prior to admission he was ambulating with a walker as needed -His wife sometimes helps care for him.  Patient also reports that he has neighbors who can come in to assist him. -his goal is for him to return  home with hospice services, but there was concern that he may not be functional enough to do that, since it appears that it is only him and his wife at home and that he was her caregiver prior to admission - Patient continues to refuse SNF and indicates that he will return home with his wife and neighbors who can help him.  Encouraged him to participate more consistently and do more with therapies.   -Did well with PT and  ambulated 210 feet this afternoon.  Hypoglycemia Noted on CBGs.  Treat per protocol.  Not on insulins  Pressure injury present on admission.  Pressure Injury 07/13/21 Sacrum Medial Stage 2 -  Partial thickness loss of dermis presenting as a shallow open injury with a red, pink wound bed without slough. (Active)  07/13/21 0000  Location: Sacrum  Location Orientation: Medial  Staging: Stage 2 -  Partial thickness loss of dermis presenting as a shallow open injury with a red, pink wound bed without slough.  Wound Description (Comments):   Present on Admission: Yes   Wound care consulted.    Nutrition:  Dietary consulted for tube feeds.    Normocytic anemia Anemia of chronic disease;  May have some element of acute blood loss with GI bleeding Currently hemoglobin 8.8 and has been stable  Goals of care -Seen by palliative care and patient does elect DNR status -He will plan on enrolling in hospice care services upon discharge home -He is currently not on comfort care and wishes to continue to "treat the treatable". -His goal is to return home with his wife with hospice services, although with his debility and weakness, not sure if this will be a viable option since it does not seem that there is extra help at home.  We will continue to see how he performs with therapies over the next couple days. -Palliative reconsulted to re address goals of care and help understand expectations after discharge-this can be addressed as outpatient.   DVT prophylaxis: SCDs Code Status: DNR  Family Communication: None at bedside. Disposition:   Status is: Inpatient  Remains inpatient appropriate because: Orthostasis.  Limited ambulation.       Consultants:  Neurology.(Signed off)  Nephrology (signed off) Palliative care Gastroenterology (signed off)  Procedures: CTA head and neck.  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 Severe left neural foraminal stenosis at C3-4 and moderate bilateral neural foraminal stenosis at C4-5.  MRI brain:  Chronic right ICA occlusion.   Antimicrobials: Antibiotics Given (last 72 hours)     None       Subjective: Denies complaints.  Appears to be in good spirits.  Patient's RN assisting him with his tube feeds and he was holding up the tube while feeds were being pulled in.  Denied dizziness on standing up for orthostatic vital checks early this morning.  Denied dizziness when he ambulated with OT yesterday.  Looking forward to going home before Saturday and wants to be home for the [place] finals.  Objective: Vitals:   07/26/21 0652 07/26/21 0806 07/26/21 1152 07/26/21 1520  BP:  126/74 129/71 125/80  Pulse:  60 60 63  Resp:  17 18 16   Temp:  97.8 F (36.6 C) 97.8 F (36.6 C) 97.8 F (36.6 C)  TempSrc:  Oral Oral Oral  SpO2:  98% 97% 98%  Weight: 49.2 kg     Height:  Intake/Output Summary (Last 24 hours) at 07/26/2021 1746 Last data filed at 07/26/2021 1232 Gross per 24 hour  Intake --  Output 1250 ml  Net -1250 ml   Filed Weights   07/24/21 0500 07/25/21 0445 07/26/21 0652  Weight: 51.6 kg 52.7 kg 49.2 kg    Examination: No change in exam/stable. General exam: Live male, moderately built, frail and chronically ill looking sitting up comfortably in the reclining chair. Respiratory system: Clear to auscultation. Respiratory effort normal. Cardiovascular system: S1 & S2 heard, RRR. No JVD, murmurs, rubs, gallops or clicks. No pedal edema.   Gastrointestinal system: Abdomen is nondistended, soft and nontender. No organomegaly or masses felt. Normal bowel sounds heard. Central nervous system: Alert and oriented. No focal neurological deficits. Extremities: Symmetric 5 x 5 power. Skin: No rashes, lesions or ulcers Psychiatry: Judgement and insight appear somewhat impaired.  Mood & affect appropriate.         Data Reviewed: I have personally reviewed following labs and imaging studies  CBC: Recent Labs  Lab 07/22/21 0154 07/23/21 0249 07/24/21 0109 07/25/21 0253 07/26/21 0200  WBC 3.1* 3.0* 7.5 3.8* 3.5*  HGB 8.7* 8.8* 8.9* 8.4* 8.9*  HCT 26.6* 27.0* 26.8* 25.5* 26.7*  MCV 96.0 94.7 95.0 95.9 96.7  PLT 207 205 197 179 315    Basic Metabolic Panel: Recent Labs  Lab 07/20/21 0255 07/21/21 0145 07/22/21 0154 07/23/21 0249 07/24/21 0109 07/25/21 0253 07/26/21 0200  NA 132* 132* 132* 131* 135 139 139  K 4.3 4.3 4.3 4.0 3.8 4.7 3.9  CL 97* 98 97* 96* 97* 101 101  CO2 29 29 30 29  34* 33* 30  GLUCOSE 101* 85 99 89 76 99 109*  BUN 48* 51* 47* 43* 63* 48* 34*  CREATININE 0.42* 0.48* 0.47* 0.39* 0.43* 0.53* 0.43*  CALCIUM 8.2* 8.5* 8.2* 8.2* 8.6* 8.8* 8.7*  PHOS 2.4* 2.3* 2.6 2.8  --   --   --     GFR: Estimated Creatinine Clearance: 57.2 mL/min (A) (by C-G formula based on SCr of 0.43 mg/dL (L)).  Liver Function Tests: Recent Labs  Lab 07/21/21 0145 07/22/21 0154 07/23/21 0249 07/25/21 0253 07/26/21 0200  AST  --   --   --  52* 78*  ALT  --   --   --  45* 66*  ALKPHOS  --   --   --  58 60  BILITOT  --   --   --  0.6 0.5  PROT  --   --   --  5.1* 5.6*  ALBUMIN 3.1* 2.7* 2.7* 2.7* 2.9*    CBG: Recent Labs  Lab 07/26/21 0001 07/26/21 0449 07/26/21 0808 07/26/21 1154 07/26/21 1526  GLUCAP 119* 105* 97 93 70     Recent Results (from the past 240 hour(s))  MRSA Next Gen by PCR, Nasal     Status: None   Collection Time: 07/17/21  8:44 AM   Specimen: Nasal Mucosa; Nasal Swab  Result Value Ref Range Status   MRSA by PCR Next Gen NOT DETECTED NOT DETECTED Final    Comment: (NOTE) The GeneXpert MRSA Assay (FDA approved for NASAL specimens only), is one component of a comprehensive MRSA colonization surveillance program. It is not intended to diagnose MRSA infection nor to guide or monitor treatment for MRSA infections. Test performance is not FDA  approved in patients less than 24 years old. Performed at [facility], [place] [address], [place], [place] [address]  Radiology Studies: No results found.      Scheduled Meds:  aspirin  81 mg Per Tube Daily   atorvastatin  20 mg Per Tube Daily   feeding supplement (KATE [place]ARMS STANDARD 1.4)  325 mL Per Tube QID   fludrocortisone  0.2 mg Per Tube Daily   free water  120 mL Per Tube QID   gabapentin  100 mg Per Tube BID   levothyroxine  50 mcg Intravenous Daily   loratadine  10 mg Per Tube Daily   midodrine  10 mg Per Tube TID WC   multivitamin with minerals  1 tablet Per Tube Daily   pantoprazole (PROTONIX) IV  40 mg Intravenous Q12H   Continuous Infusions:     LOS: 13 days        Vernell Leep, MD [facility]   To contact the attending provider between 7A-7P or the covering provider during after hours 7P-7A, please log into the web site [email or web address] and access using universal [place] password for that web site. If you do not have the password, please call the hospital operator.  07/26/2021, 5:46 PM

## 2021-07-26 NOTE — Progress Notes (Signed)
Physical Therapy Treatment Patient Details Name: Alexander Duran MRN: [id number] DOB: [birth year] Today's Date: 07/26/2021   History of Present Illness Pt is a 76 y/o male presenting on 1/26 with diarrhea.  Admitted with dehydration, complicated by generalized weakness and hyponatremia. Noted recent admission 06/17/21 for covid infection. L sided numbness and hypotension began 1/27, code stroke activated. CT/CTA negative. MRI brain/cervical spine negative for acute abnormalities. 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    PT Comments    Pt motivated to participate with PT today, progressing to ambulating up to ~210 ft with a RW at a min guard-supervision level without LOB or symptoms of lightheadedness. Pt does ambulate at a slow pace and display some instability when performing dynamic standing tasks without UE support, but no overt LOB. Updated PT goals to reflect pt progress. Updated d/c recs to HHPT also due to pt's improved mobility. Will continue to follow acutely.    Recommendations for follow up therapy are one component of a multi-disciplinary discharge planning process, led by the attending physician.  Recommendations may be updated based on patient status, additional functional criteria and insurance authorization.  Follow Up Recommendations  Home health PT (planning home with hospice?)     Assistance Recommended at Discharge Frequent or constant Supervision/Assistance  Patient can return home with the following Direct supervision/assist for medications management;Direct supervision/assist for financial management;Assist for transportation;Help with stairs or ramp for entrance;Assistance with cooking/housework;A little help with walking and/or transfers;A little help with bathing/dressing/bathroom   Equipment Recommendations  Hospital bed (hospital bed with overlay mattress to protect skin/further wounds from developing)     Recommendations for Other Services       Precautions / Restrictions Precautions Precautions: Fall Precaution Comments: g-tube Restrictions Weight Bearing Restrictions: No     Mobility  Bed Mobility Overal bed mobility: Needs Assistance Bed Mobility: Rolling, Sidelying to Sit Rolling: Supervision Sidelying to sit: Supervision, HOB elevated       General bed mobility comments: Extra time and effort, supervision for safety using bed rail with HOB elevated.    Transfers Overall transfer level: Needs assistance Equipment used: Rolling walker (2 wheels) Transfers: Sit to/from Stand Sit to Stand: Min guard, Supervision           General transfer comment: close min guard for safety but ultimately no physical assist coming up to stand from EOB. Educated pt to push up from bed rather than pull up on RW as he did. Supervision for transfer from commode    Ambulation/Gait Ambulation/Gait assistance: Supervision, Min guard Gait Distance (Feet): 210 Feet Assistive device: Rolling walker (2 wheels) Gait Pattern/deviations: Step-through pattern, Decreased step length - right, Decreased step length - left, Trunk flexed, Decreased stride length Gait velocity: reduced Gait velocity interpretation: <1.31 ft/sec, indicative of household ambulator   General Gait Details: Pt with kyphotic posture and slow, mostly steady gait. No LOB, min guard-supervision for safety with mobility in close spaces in the room and in open, busy spaces in hallway.   Stairs             Wheelchair Mobility    Modified Rankin (Stroke Patients Only)       Balance Overall balance assessment: Needs assistance Sitting-balance support: Feet supported, No upper extremity supported Sitting balance-Leahy Scale: Fair     Standing balance support: Bilateral upper extremity supported, During functional activity, No upper extremity supported Standing balance-Leahy Scale: Fair Standing balance comment: Pt  able to reach inferiorly to manage  depends and reach min off BOS to perform dental care at sink without LOB, but mild unsteadiness noted. Used bil UE support for mobility.                            Cognition Arousal/Alertness: Awake/alert Behavior During Therapy: WFL for tasks assessed/performed Overall Cognitive Status: No family/caregiver present to determine baseline cognitive functioning Area of Impairment: Memory, Awareness, Problem solving                     Memory: Decreased short-term memory     Awareness: Emergent Problem Solving: Slow processing General Comments: mostly pleasant affect, but can get overwhekmed easily. decreased problem solving.        Exercises      General Comments        Pertinent Vitals/Pain Pain Assessment Pain Assessment: Faces Faces Pain Scale: No hurt Pain Intervention(s): Monitored during session    Home Living                          Prior Function            PT Goals (current goals can now be found in the care plan section) Acute Rehab PT Goals Patient Stated Goal: to get up and move PT Goal Formulation: With patient Time For Goal Achievement: 08/09/21 Potential to Achieve Goals: Fair Progress towards PT goals: Progressing toward goals;Goals met and updated - see care plan    Frequency    Min 3X/week      PT Plan Discharge plan needs to be updated;Equipment recommendations need to be updated    Co-evaluation              AM-PAC PT "6 Clicks" Mobility   Outcome Measure  Help needed turning from your back to your side while in a flat bed without using bedrails?: A Little Help needed moving from lying on your back to sitting on the side of a flat bed without using bedrails?: A Little Help needed moving to and from a bed to a chair (including a wheelchair)?: A Little Help needed standing up from a chair using your arms (e.g., wheelchair or bedside chair)?: A Little Help needed to  walk in hospital room?: A Little Help needed climbing 3-5 steps with a railing? : A Little 6 Click Score: 18    End of Session Equipment Utilized During Treatment:  (pt refused gait belt) Activity Tolerance: Patient tolerated treatment well Patient left: in chair;with call bell/phone within reach Nurse Communication: Mobility status PT Visit Diagnosis: Other abnormalities of gait and mobility (R26.89);Muscle weakness (generalized) (M62.81);Unsteadiness on feet (R26.81);Difficulty in walking, not elsewhere classified (R26.2)     Time: 9735-3299 PT Time Calculation (min) (ACUTE ONLY): 33 min  Charges:  $Gait Training: 8-22 mins $Therapeutic Activity: 8-22 mins                     Moishe Spice, PT, DPT Acute Rehabilitation Services  Pager: [phone] Office: [place] 07/26/2021, 5:22 PM

## 2021-07-26 NOTE — Progress Notes (Signed)
Nutrition Follow-up  DOCUMENTATION CODES:   Severe malnutrition in context of chronic illness  INTERVENTION:   Continue gravity bolus feedings: [employer] Standard 1.4 1 carton QID for a total of 4 cartons per day.  Flush with 60 ml free water before and after each bolus feeding. Provides 1820 kcal, 80 gm protein, 1416 ml free water daily.  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  Ongoing   GOAL:   Patient will meet greater than or equal to 90% of their needs  Met with TF  MONITOR:   Labs, Weight trends, TF tolerance, Skin, I & O's  REASON FOR ASSESSMENT:   Consult Enteral/tube feeding initiation and management  ASSESSMENT:   76 y.o. 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  Patient states he worked with physical therapy today and he thinks it went well. He received 2 containers of [employer] Standard 1.4 this morning and is not sure he can take that much volume again this afternoon. Will change to 1 container 4 times per day.   Per discussion with Engineer, manufacturing systems, D[employer] standard 1.4 will be available for patient when he is discharged and is covered by Hospice.   Discussed tube feeding regimen with wife over the phone. She requested that RD send her a hard copy of the TF regimen in the mail. RD to mail that out today. Also put this in discharge instructions.   Labs and medications reviewed. CBG: [phone]  Diet Order:   Diet Order             Diet NPO time specified  Diet effective now                   EDUCATION NEEDS:   No education needs  have been identified at this time  Skin:  Skin Assessment: Skin Integrity Issues: Skin Integrity Issues:: Stage II Stage II: sacrum  Last BM:  2/9  Height:   Ht Readings from Last 1 Encounters:  07/25/21 _0  (1.676 m)    Weight:   Wt Readings from Last 1 Encounters:  07/26/21 49.2 kg    Ideal Body Weight:  64.5 kg  BMI:  Body mass index is 17.51 kg/m.  Estimated Nutritional Needs:   Kcal:  1800-2100kcal/day  Protein:  80-100 gm  Fluid:  1.6-1.8L/day    Lucas Mallow RD, LDN, CNSC Please refer to Amion for contact information.

## 2021-07-26 NOTE — Discharge Instructions (Addendum)
Tube Feeding for Alexander Duran:  Formula name: [employer] standard 1.4 Give 1 container (11 ounces) 4 times per day. Suggested times: 8am, 12pm, 4pm, 8pm You may adjust feeding schedule based on your personal schedule. Make sure you are taking 4 containers per day total.   Give 60 ml water flush before and after each feeding.    Additional Discharge Instructions   Please get your medications reviewed and adjusted by your Primary MD.  Please request your Primary MD to go over all Hospital Tests and Procedure/Radiological results at the follow up, please get all Hospital records sent to your Prim MD by signing hospital release before you go home.  If you had Pneumonia of Lung problems at the Hospital: Please get a 2 view Chest X ray done in approximately 4 weeks after hospital discharge or sooner if instructed by your Primary MD.  If you have Congestive Heart Failure: Please call your Cardiologist or Primary MD anytime you have any of the following symptoms:  1) 3 pound weight gain in 24 hours or 5 pounds in 1 week  2) shortness of breath, with or without a dry hacking cough  3) swelling in the hands, feet or stomach  4) if you have to sleep on extra pillows at night in order to breathe  Follow cardiac low salt diet and 1.5 lit/day fluid restriction.  If you have diabetes Accuchecks 4 times/day, Once in AM empty stomach and then before each meal. Log in all results and show them to your primary doctor at your next visit. If any glucose reading is under 80 or above 300 call your primary MD immediately.  If you have Seizure/Convulsions/Epilepsy: Please do not drive, operate heavy machinery, participate in activities at heights or participate in high speed sports until you have seen by Primary MD or a Neurologist and advised to do so again. Per [facility] statutes, patients with seizures are not allowed to drive until they have been seizure-free for six months.  Use caution  when using heavy equipment or power tools. Avoid working on ladders or at heights. Take showers instead of baths. Ensure the water temperature is not too high on the home water heater. Do not go swimming alone. Do not lock yourself in a room alone (i.e. bathroom). When caring for infants or small children, sit down when holding, feeding, or changing them to minimize risk of injury to the child in the event you have a seizure. Maintain good sleep hygiene. Avoid alcohol.   If you had Gastrointestinal Bleeding: Please ask your Primary MD to check a complete blood count within one week of discharge or at your next visit. Your endoscopic/colonoscopic biopsies that are pending at the time of discharge, will also need to followed by your Primary MD.  Get Medicines reviewed and adjusted. Please take all your medications with you for your next visit with your Primary MD  Please request your Primary MD to go over all hospital tests and procedure/radiological results at the follow up, please ask your Primary MD to get all Hospital records sent to his/her office.  If you experience worsening of your admission symptoms, develop shortness of breath, life threatening emergency, suicidal or homicidal thoughts you must seek medical attention immediately by calling 911 or calling your MD immediately  if symptoms less severe.  You must read complete instructions/literature along with all the possible adverse reactions/side effects for all the Medicines you take and that have been prescribed to you. Take  any new Medicines after you have completely understood and accpet all the possible adverse reactions/side effects.   Do not drive or operate heavy machinery when taking Pain medications.   Do not take more than prescribed Pain, Sleep and Anxiety Medications  Special Instructions: If you have smoked or chewed Tobacco  in the last 2 yrs please stop smoking, stop any regular Alcohol  and or any Recreational drug  use.  Wear Seat belts while driving.  Please note You were cared for by a hospitalist during your hospital stay. If you have any questions about your discharge medications or the care you received while you were in the hospital after you are discharged, you can call the unit and asked to speak with the hospitalist on call if the hospitalist that took care of you is not available. Once you are discharged, your primary care physician will handle any further medical issues. Please note that NO REFILLS for any discharge medications will be authorized once you are discharged, as it is imperative that you return to your primary care physician (or establish a relationship with a primary care physician if you do not have one) for your aftercare needs so that they can reassess your need for medications and monitor your lab values.  You can reach the hospitalist office at phone [phone] or fax [phone]   If you do not have a primary care physician, you can call [phone] for a physician referral.

## 2021-07-27 ENCOUNTER — Other Ambulatory Visit (HOSPITAL_COMMUNITY): Payer: Self-pay

## 2021-07-27 DIAGNOSIS — K5900 Constipation, unspecified: Secondary | ICD-10-CM

## 2021-07-27 LAB — COMPREHENSIVE METABOLIC PANEL
ALT: 79 U/L — ABNORMAL HIGH (ref 0–44)
AST: 79 U/L — ABNORMAL HIGH (ref 15–41)
Albumin: 2.9 g/dL — ABNORMAL LOW (ref 3.5–5.0)
Alkaline Phosphatase: 62 U/L (ref 38–126)
Anion gap: 6 (ref 5–15)
BUN: 31 mg/dL — ABNORMAL HIGH (ref 8–23)
CO2: 30 mmol/L (ref 22–32)
Calcium: 8.6 mg/dL — ABNORMAL LOW (ref 8.9–10.3)
Chloride: 104 mmol/L (ref 98–111)
Creatinine, Ser: 0.48 mg/dL — ABNORMAL LOW (ref 0.61–1.24)
GFR, Estimated: >60 mL/min (ref >60)
Glucose, Bld: 82 mg/dL (ref 70–99)
Potassium: 4.1 mmol/L (ref 3.5–5.1)
Sodium: 140 mmol/L (ref 135–145)
Total Bilirubin: 0.6 mg/dL (ref 0.3–1.2)
Total Protein: 5.5 g/dL — ABNORMAL LOW (ref 6.5–8.1)

## 2021-07-27 LAB — CBC
HCT: 26 % — ABNORMAL LOW (ref 39.0–52.0)
Hemoglobin: 8.3 g/dL — ABNORMAL LOW (ref 13.0–17.0)
MCH: 31.1 pg (ref 26.0–34.0)
MCHC: 31.9 g/dL (ref 30.0–36.0)
MCV: 97.4 fL (ref 80.0–100.0)
Platelets: 186 10*3/uL (ref 150–400)
RBC: 2.67 MIL/uL — ABNORMAL LOW (ref 4.22–5.81)
RDW: 17.8 % — ABNORMAL HIGH (ref 11.5–15.5)
WBC: 3.1 10*3/uL — ABNORMAL LOW (ref 4.0–10.5)
nRBC: 0 % (ref 0.0–0.2)

## 2021-07-27 LAB — GLUCOSE, CAPILLARY
Glucose-Capillary: 102 mg/dL — ABNORMAL HIGH (ref 70–99)
Glucose-Capillary: 107 mg/dL — ABNORMAL HIGH (ref 70–99)
Glucose-Capillary: 56 mg/dL — ABNORMAL LOW (ref 70–99)
Glucose-Capillary: 79 mg/dL (ref 70–99)
Glucose-Capillary: 98 mg/dL (ref 70–99)

## 2021-07-27 MED ORDER — ESOMEPRAZOLE MAGNESIUM 40 MG PO CPDR
DELAYED_RELEASE_CAPSULE | ORAL | 1 refills | Status: DC
  Filled 2021-07-27: qty 60, 30d supply, fill #0

## 2021-07-27 MED ORDER — KATE FARMS STANDARD 1.4 PO LIQD
325.0000 mL | Freq: Four times a day (QID) | ORAL | 0 refills | Status: AC
  Filled 2021-07-27: qty 39000, 30d supply, fill #0
  Filled 2021-07-27: qty 3900, 3d supply, fill #0

## 2021-07-27 MED ORDER — GLUCOSE BLOOD VI STRP
ORAL_STRIP | 0 refills | Status: AC
Start: 2021-07-27 — End: ?
  Filled 2021-07-27: qty 100, 30d supply, fill #0

## 2021-07-27 MED ORDER — MIDODRINE HCL 10 MG PO TABS
10.0000 mg | ORAL_TABLET | Freq: Three times a day (TID) | ORAL | 1 refills | Status: DC
  Filled 2021-07-27: qty 90, 30d supply, fill #0

## 2021-07-27 MED ORDER — BLOOD GLUCOSE MONITOR SYSTEM W/DEVICE KIT
PACK | 30 refills | Status: AC
  Filled 2021-07-27: qty 1, 30d supply, fill #0

## 2021-07-27 MED ORDER — PANTOPRAZOLE SODIUM 40 MG PO PACK
40.0000 mg | PACK | Freq: Two times a day (BID) | ORAL | 1 refills | Status: DC
Start: 2021-07-27 — End: 2021-07-27
  Filled 2021-07-27: qty 60, 30d supply, fill #0

## 2021-07-27 MED ORDER — FREE WATER: 15 refills | Status: DC

## 2021-07-27 MED ORDER — PANTOPRAZOLE SODIUM 40 MG PO PACK: 40.0000 mg | PACK | Freq: Two times a day (BID) | ORAL | 1 refills | Status: DC

## 2021-07-27 MED ORDER — ATORVASTATIN CALCIUM 20 MG PO TABS: 20.0000 mg | ORAL_TABLET | Freq: Every day | ORAL | Status: DC

## 2021-07-27 MED ORDER — ACCU-CHEK SOFTCLIX LANCETS MISC
0 refills | Status: AC
  Filled 2021-07-27: qty 100, 30d supply, fill #0

## 2021-07-27 MED ORDER — FLUDROCORTISONE ACETATE 0.1 MG PO TABS: 0.2000 mg | ORAL_TABLET | Freq: Every day | ORAL | 1 refills | Status: DC

## 2021-07-27 MED ORDER — DEXTROSE 50 % IV SOLN
12.5000 g | INTRAVENOUS | Status: AC
  Administered 2021-07-27: 12.5 g via INTRAVENOUS

## 2021-07-27 MED ORDER — ASPIRIN 81 MG PO CHEW: 81.0000 mg | CHEWABLE_TABLET | Freq: Every evening | ORAL | Status: DC

## 2021-07-27 MED ORDER — ASPIRIN 81 MG PO CHEW
81.0000 mg | CHEWABLE_TABLET | Freq: Every evening | ORAL | Status: AC
Start: 2021-07-27 — End: ?

## 2021-07-27 MED ORDER — ADULT MULTIVITAMIN W/MINERALS CH: 1.0000 | ORAL_TABLET | Freq: Every day | ORAL | Status: DC

## 2021-07-27 MED ORDER — FLUDROCORTISONE ACETATE 0.1 MG PO TABS
0.2000 mg | ORAL_TABLET | Freq: Every day | ORAL | 1 refills | Status: DC
  Filled 2021-07-27: qty 60, 30d supply, fill #0

## 2021-07-27 MED ORDER — KATE FARMS STANDARD 1.4 PO LIQD
325.0000 mL | Freq: Four times a day (QID) | ORAL | 0 refills | Status: DC
Start: 2021-07-27 — End: 2021-07-27

## 2021-07-27 MED ORDER — FREE WATER
15 refills | Status: AC
Start: 2021-07-27 — End: ?

## 2021-07-27 MED ORDER — LEVOTHYROXINE SODIUM 100 MCG PO TABS
100.0000 ug | ORAL_TABLET | Freq: Every day | ORAL | 1 refills | Status: AC
  Filled 2021-07-27: qty 30, 30d supply, fill #0

## 2021-07-27 MED ORDER — GABAPENTIN 250 MG/5ML PO SOLN
100.0000 mg | Freq: Two times a day (BID) | ORAL | 10 refills | Status: DC
  Filled 2021-07-27: qty 12, 3d supply, fill #0

## 2021-07-27 MED ORDER — LEVOTHYROXINE SODIUM 100 MCG PO TABS: 100.0000 ug | ORAL_TABLET | Freq: Every day | ORAL | 1 refills | Status: DC

## 2021-07-27 MED ORDER — MIDODRINE HCL 10 MG PO TABS: 10.0000 mg | ORAL_TABLET | Freq: Three times a day (TID) | ORAL | 1 refills | Status: DC

## 2021-07-27 MED ORDER — GABAPENTIN 250 MG/5ML PO SOLN: 100.0000 mg | Freq: Two times a day (BID) | ORAL | 10 refills | Status: DC

## 2021-07-27 MED ORDER — BLOOD GLUCOSE MONITOR KIT: PACK | 0 refills | Status: DC

## 2021-07-27 MED ORDER — DEXTROSE 50 % IV SOLN
INTRAVENOUS | Status: AC
  Filled 2021-07-27: qty 50

## 2021-07-27 NOTE — TOC Transition Note (Signed)
Transition of Care (TOC) - CM/SW Discharge Note Marvetta Gibbons RN, BSN Transitions of Care Unit 4E- RN Case Manager See Treatment Team for direct phone #    Patient Details  Name: Alexander Duran MRN: [id number] Date of Birth: Nov 28, 1945  Transition of Care [facility] CM/SW Contact:  Dawayne Patricia, RN Phone Number: 07/27/2021, 2:31 PM   Clinical Narrative:    Pt stable for transition home today w/ wife. Per patient- brother will transport home.  Pt has been approved for [place] has been following. CM spoke with Bevely Palmer at [facility] and confirmed [facility] services, they will try to schedule admission RN for later today depending on timing of when pt leaves the hospital, if they are unable to see pt today then they will schedule for first thing in the am. Per [facility] as approved home TF - and see will send in today to TF to be processed for delivery to home- pt will need to have some TF sent home from hospital to last through the weekend until his home TF are delivered- plan for Monday.   Call made to wife- Alexander Duran- and questions answered regarding transition home and TFs. Per Alexander Duran they have needed DME at this time, hospice will f/u for any additional DME that may be needed later.   [place] pharmacy to fill meds and provide TF prior to discharge. Bedside RN aware and working on making sure pt has TF.    Final next level of care: Home w Hospice Care Barriers to Discharge: Barriers Resolved   Patient Goals and CMS Choice Patient states their goals for this hospitalization and ongoing recovery are:: to go home CMS Medicare.gov Compare Post Acute Care list provided to:: Patient Choice offered to / list presented to : Patient  Discharge Placement                 Home w/ Hospice.       Discharge Plan and Services In-house Referral: Clinical Social Work              DME Arranged: Francesco Runner feeding DME Agency: Hospice and [place] Date DME Agency Contacted: 07/27/21 Time DME Agency Contacted: 1430 Representative spoke with at DME Agency: [place]: Disease Management [place] Agency: Hospice and [place] Date [place]: 07/27/21 Time [place]: 1430 Representative spoke with at [place]: [place] ([place]) Interventions     Readmission Risk Interventions Readmission Risk Prevention Plan 07/27/2021 06/27/2021  Transportation Screening Complete Complete  PCP or Specialist Appt within 5-7 Days - Complete  PCP or Specialist Appt within 3-5 Days Complete -  Home Care Screening - Complete  Medication Review (RN CM) - Complete  HRI or Home Care Consult Complete -  Social Work Consult for [place] Planning/Counseling Complete -  Palliative Care Screening Complete -  Medication Review Press photographer) Complete -  Some recent data might be hidden

## 2021-07-27 NOTE — Discharge Summary (Addendum)
Physician Discharge Summary  Alexander Duran FUX:[id number] DOB: [birth year]  PCP: Windy Fast, MD  Admitted from: Home Discharged to: Home with hospice  Admit date: 07/12/2021 Discharge date: 07/27/2021  Recommendations for Outpatient Follow-up:    Follow-up Information     [facility]. Schedule an appointment as soon as possible for a visit in 1 month(s).   Specialty: Neurology Contact information: [address] [place] [phone]        Windy Fast, MD. Schedule an appointment as soon as possible for a visit in 1 week(s).   Specialty: Internal Medicine Why: To be seen with repeat labs (CBC & CMP) depending on how aggressive patient wishes to be. Contact information: [address] [place] [phone]         [place] Follow up.   Specialty: Hospice and Palliative Medicine Why: Home Hospice arranged- they will contact you to schedule admission visit Contact information: [place]: [place] Orders (From admission, onward)     Start     Ordered   07/27/21 [place]  At discharge       Question Answer Comment  To provide the following care/treatments PT   To provide the following care/treatments OT      07/27/21 1318             Equipment/Devices: As per therapy recommendations: Wheelchair, wheelchair cushion, hospital bed with overlay mattress.    Discharge Condition: Improved and stable.  Overall prognosis is guarded and poor.   Code Status: DNR Diet recommendation: NPO.  Patient is on tube feeds.    Discharge Diagnoses:  Principal Problem:   Dehydration Active Problems:   Generalized weakness   Tonsil cancer ([place])   Hypothyroidism   LLL pneumonia   DNR (do not resuscitate)   Protein-calorie malnutrition, severe   Diarrhea   Acute hyponatremia   HLD  (hyperlipidemia)   Pressure injury of skin   GI bleeding   Acute blood loss anemia   Orthostatic hypotension   Brief Summary: Alexander Duran is a 76 y.o. 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  Of note, patient was recently hospitalized starting 06/17/2021 for COVID-19 infection. On the day of admission, pt had trouble with left arm numbness and a code stroke was called. MRI of the brain was negative for acute stroke, pt has chronic right ICA stenosis.  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 He was also found to have to left lung pneumonia and he was started on IV antibiotics.  Palliative care consulted in view of his recurrent admissions, increased debility and deconditioning.  He has progressively done well with physical therapy.  He has declined SNF.  He will be going home with hospice.     Assessment & Plan:    Dehydration probably from diarrhea. Subsequently developed constipation Pt reports at home he was constipated and he was asked to increased the rate of the tube feeds and take stool softeners and laxatives. He subsequently developed significant diarrhea which led him to present to the hospital for dehydration.  Diarrhea  did eventually resolve, but he subsequently developed constipation again.   Abdominal x-ray done 2/2 shows large stool burden in colon He was started on MiraLAX, but did not have resolution of constipation Eventually disimpacted on 2/6 and since then reports that diarrhea has restarted (?  Overflow incontinence). KUB repeated on 2/7 showed decreased stool burden in colon.  Last BM documented on 2/9.  No constipation or diarrhea reported for the last  couple of days.    Generalized weakness with some tingling and numbness in the fingers of left hand and left leg as well as significant dysarthria MRI of the brain is negative for acute intracranial abnormalities or acute stroke. 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 He was seen by neurology who felt that he may have had a small infarct that was not apparent on MRI Recommendations were to continue aspirin and add Plavix for the next 3 weeks Unfortunately, Plavix had to be discontinued due to development of GI bleeding.  Patient reports bleeding issues with Plavix in the past Discussed restarting antiplatelet therapy since GI bleeding has now resolved.  Patient feels that restarting Plavix at this point would be high risk for bleeding and hence was not initiated.  Aspirin was restarted. We have added gabapentin to help with neuropathic pain.  Ambulated 210 feet with PT yesterday and 240 feet today.   Bilateral foot numbness -Patient reported on 2/7, that he had been experiencing intermittent bilateral feet numbness, which was up to his ankles -Did not have any significant worsening weakness, denies paresthesias -His pulses are equal bilaterally -B12 is not significantly low -Patellar reflexes are present bilaterally -TSH is significantly elevated, unclear if this is leading to neuropathy/myopathy -Has not complained of feet numbness to me over the last couple of days caring for him.  Has progressively done well with physical therapy over the last 2 days   GI bleeding -Noted by staff to have "rust colored stools" which was FOBT positive.  Unclear when this occurred. -Patient's Plavix was discontinued but aspirin has been resumed.  Patient does not wish to restart Plavix at this time. -Hemoglobin has been stable continue to monitor -On Protonix twice daily -Seen by GI, EGD was not recommended at this time due to associated high  risk  -Overall hemoglobin has been stable -Stools now appear to be brown, no evidence of gross bleeding.   History of tonsillar cancer status post radiation therapy in the past -G-tube dependent since 06/21/2021 due to significant dysphagia -Currently on continuous feeding, wishes to transition back to bolus feeding when appropriate.  Same has been done in the hospital.  Registered dietitian has evaluated patient and patient will be returning home on a new tube feeding regimen.   Hypothermia, hypotension, SIRS / Left lower lobe pneumonia SIRS resolved CXR showed left lower lobe consolidations. Started the patient on IV vancomycin and cefepime. On 1/30 pt started cough with blood tinged sputum. CT chest showed . Airspace consolidation involving the entire left lower lobe compatible with pneumonia. Tree-in-bud and ill-defined nodular densities in the right lower lobe and left upper lobe, likely infectious/inflammatory. MRSA PCR negative, vancomycin discontinued and cefepime de-escalated to ceftriaxone Unclear if he has post covid pneumonia.  COVID-19 PCR is negative.   Patient clinically improved and has completed a course of treatment for pneumonia.   Hyponatremia Probably initially secondary to hypovolemia from dehydration in the setting of GI losses in the form of diarrhea. ?  A component of SIADH and also due to hypothyroidism. In view of his left lower lung pneumonia, recent COVID-19 infection. Treated with fluid restriction, urea and Lasix.  Hyponatremia has resolved.  Discontinued urea and monitor. Appreciate nephrology recommendations and following.  Nephrology signed off on 2/1. Tsh elevated. Cortisol is wnl    Orthostatic hypotension Patient becomes significantly symptomatic on standing -He has been adequately volume resuscitated and clinically is euvolemic -continue midodrine, dose increased compared to that prior to admission. -apply ted dose -cortisol normal -Fludrocortisone  started during this hospital admission, continued at discharge. -Despite patient having significantly orthostatic vital signs, asymptomatic of dizziness and has done well with physical therapy over the last couple of days.  Continue bilateral TED hoses.  Patient has been counseled regarding instructions regarding gradual transition from supine/sitting to upright position at home.   Hypothyroidism TSH high at 14, low free t3, free t4 level normal. Pt was on synthroid 75 mcg daily.  Increased synthroid to 100 mcg daily.  TSH rechecked on 2/6 and further elevated at 21 Since the patient is on continuous tube feeds, this may be impacting the absorption of levothyroxine Synthroid was briefly changed to IV while hospitalized. Switched back to 100 mcg daily at time of discharge with recommendations to repeat TSH in 4 weeks.     Hyperlipidemia Continue with statin.   Generalized weakness -Patient undergoing PT/OT -Reports that prior to admission he was ambulating with a walker as needed -His wife sometimes helps care for him.  Patient also reports that he has neighbors who can come in to assist him.   Hypoglycemia Noted on CBGs.  Patient however is asymptomatic.  Treated per hypoglycemic protocol in the hospital.  Discussed with RN will provide patient with education regarding home monitoring with CBG checks and treating appropriately.  Etiology unclear.   Pressure injury present on admission.  Pressure Injury 07/13/21 Sacrum Medial Stage 2 -  Partial thickness loss of dermis presenting as a shallow open injury with a red, pink wound bed without slough. (Active)  07/13/21 0000  Location: Sacrum  Location Orientation: Medial  Staging: Stage 2 -  Partial thickness loss of dermis presenting as a shallow open injury with a red, pink wound bed without slough.  Wound Description (Comments):   Present on Admission: Yes    Wound care consulted.      Nutrition:  Dietary consulted for tube feeds.       Normocytic anemia Anemia of chronic disease;  May have some element of acute blood loss with GI bleeding Currently hemoglobin 8.8 and has been stable Mild thrombocytopenia.  Unclear etiology.  Relatively stable.  May consider outpatient follow-up depending on how aggressive patient wishes to be.  Mildly abnormal LFTs Mild transaminitis.  Unclear etiology.  No GI symptoms.  Relatively stable.  Outpatient follow-up testing necessary.   Goals of care -Seen by palliative care and patient does elect DNR status -He will plan on enrolling in hospice care services upon discharge home -He is currently not on comfort care and wishes to continue to "treat the treatable". -His goal is to return home with his wife with hospice services.        Consultants:  Neurology.(Signed off)  Nephrology (signed off) Palliative care Gastroenterology (signed off)   Procedures: CTA head and neck.  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 Severe left neural foraminal stenosis at C3-4 and moderate bilateral  neural foraminal stenosis at C4-5.   MRI brain:  Chronic right ICA occlusion.      Discharge Instructions  Discharge Instructions     Call MD for:   Complete by: As directed    Recurrent strokelike symptoms.  Recurrent blood or black-colored material in feeding tube or dark tarry stools or blood in stools.   Call MD for:  difficulty breathing, headache or visual disturbances   Complete by: As directed    Call MD for:  extreme fatigue   Complete by: As directed    Call MD for:  persistant dizziness or light-headedness   Complete by: As directed    Call MD for:  persistant nausea and vomiting   Complete by: As directed    Call MD for:  redness, tenderness, or signs of infection (pain, swelling, redness, odor or green/yellow discharge around incision site)   Complete by: As directed     Call MD for:  severe uncontrolled pain   Complete by: As directed    Call MD for:  temperature >100.4   Complete by: As directed    Discharge instructions   Complete by: As directed    1) nothing to take by mouth.  All diet and medications to be given via stomach tube. 2) check your fingerstick blood glucose 4 times daily or more as needed and if your blood sugar is less than 80 mg/dl, give yourself an additional half to 1 can of tube feeds (as tolerated) and recheck blood glucose.  Instead of the additional tube feeds, you may also suck or chew on sweet candy but do not swallow the candy. 3) continue to wear compression stockings on both legs, especially when you are up and about.   Discharge wound care:   Complete by: As directed    Foam dressing of Sacral/buttock wound:      Comments: Apply small foam dressing (not large sacrum dressing) over sacrum location and change every 3 days or as needed for soiling.   Increase activity slowly   Complete by: As directed         Medication List     STOP taking these medications    fluticasone 50 MCG/ACT nasal spray Commonly known as: FLONASE   loratadine 10 MG tablet Commonly known as: CLARITIN   pantoprazole sodium 40 mg Commonly known as: PROTONIX       TAKE these medications    Accu-Chek Softclix Lancets lancets use as directed up to 4 times daily   albuterol 108 (90 Base) MCG/ACT inhaler Commonly known as: VENTOLIN HFA Inhale 2 puffs into the lungs every 6 (six) hours as needed for wheezing or shortness of breath.   aspirin 81 MG chewable tablet Place 1 tablet (81 mg total) into feeding tube every evening. What changed: how to take this   atorvastatin 20 MG tablet Commonly known as: LIPITOR Place 1 tablet (20 mg total) into feeding tube daily. What changed: how to take this   Blood Glucose Monitor System w/Device Kit Use as directed up to 4 times daily   esomeprazole 40 MG capsule Commonly known as:  NexIUM Place 1 capsule in NG tube twice daily. See admin instructions attached   feeding supplement ([place] STANDARD 1.4) Liqd liquid Place 325 mLs into feeding tube 4 (four) times daily. Formula name: [place] standard 1.4 Give 1 container (11 ounces) 4 times per day: 8am, 12pm, 4pm, 8pm. What changed: how much to take   fludrocortisone 0.1 MG tablet Commonly  known as: FLORINEF Place 2 tablets (0.2 mg total) into feeding tube daily. Start taking on: July 28, 2021   free water Soln Give 60 ml water flush before and after each feeding.   gabapentin 250 MG/5ML solution Commonly known as: NEURONTIN Place 2 mLs (100 mg total) into feeding tube 2 (two) times daily.   glucose blood test strip Use as directed up to 4 times daily   levothyroxine 100 MCG tablet Commonly known as: SYNTHROID Place 1 tablet (100 mcg total) into feeding tube daily before breakfast. What changed:  medication strength how much to take how to take this   midodrine 10 MG tablet Commonly known as: PROAMATINE Place 1 tablet (10 mg total) into feeding tube 3 (three) times daily with meals. What changed:  medication strength how much to take   multivitamin with minerals Tabs tablet Place 1 tablet into feeding tube daily. Start taking on: July 28, 2021   OVER THE COUNTER MEDICATION TheraTears Liquid Eye Drops- Place 1-2 drops into both eyes one to three times as day as needed for dryness or irritation   SM Pain Reliever Childrens 160 MG/5ML suspension Generic drug: acetaminophen Place 20.3 mLs (650 mg total) into feeding tube every 8 (eight) hours as needed for mild pain.       Allergies  Allergen Reactions   Iodinated Contrast Media     Other reaction(s): Weakness present      Procedures/Studies: DG Abd 1 View  Result Date: 07/24/2021 CLINICAL DATA:  Constipation EXAM: ABDOMEN - 1 VIEW COMPARISON:  07/19/2021 FINDINGS: Gastrostomy tube over the left upper quadrant. Mild diffuse  increased bowel gas without obstructive pattern. No radiopaque calculi. Left hip replacement. Decreased stool in the colon and rectum. IMPRESSION: Nonobstructed gas pattern. Electronically Signed   By: Donavan Foil M.D.   On: 07/24/2021 15:53   DG Abd 1 View  Result Date: 07/19/2021 CLINICAL DATA:  Diarrhea, general weakness EXAM: ABDOMEN - 1 VIEW COMPARISON:  None. FINDINGS: Nonobstructive pattern of bowel gas. Percutaneous gastrostomy tube projects over the general vicinity of the stomach. No obvious free air in the abdomen on single supine radiograph. Large burden of stool and stool balls throughout the left colon. IMPRESSION: 1. Nonobstructive pattern of bowel gas. Percutaneous gastrostomy tube projects over the general vicinity of the stomach. 2.  Large burden of stool and stool balls throughout the left colon. Electronically Signed   By: Delanna Ahmadi M.D.   On: 07/19/2021 10:55   CT HEAD WO CONTRAST (5MM)  Result Date: 07/13/2021 CLINICAL DATA:  Acute stroke suspected EXAM: CT HEAD WITHOUT CONTRAST TECHNIQUE: Contiguous axial images were obtained from the base of the skull through the vertex without intravenous contrast. RADIATION DOSE REDUCTION: This exam was performed according to the departmental dose-optimization program which includes automated exposure control, adjustment of the mA and/or kV according to patient size and/or use of iterative reconstruction technique. COMPARISON:  None. FINDINGS: Brain: No evidence of acute infarction, hemorrhage, hydrocephalus, extra-axial collection or mass lesion/mass effect. Vascular: No hyperdense vessel or unexpected calcification. Skull: Normal. Negative for fracture or focal lesion. Sinuses/Orbits: No acute finding. Other: These results were communicated to Dr. Leonel Ramsay at 4:31 am on 07/13/2021 by text page via the [facility] messaging system. IMPRESSION: Negative head CT. Electronically Signed   By: Jorje Guild M.D.   On: 07/13/2021 04:31   CT CHEST WO  CONTRAST  Result Date: 07/17/2021 CLINICAL DATA:  Hemoptysis. EXAM: CT CHEST WITHOUT CONTRAST TECHNIQUE: Multidetector CT imaging of the chest  was performed following the standard protocol without IV contrast. RADIATION DOSE REDUCTION: This exam was performed according to the departmental dose-optimization program which includes automated exposure control, adjustment of the mA and/or kV according to patient size and/or use of iterative reconstruction technique. COMPARISON:  Chest CT 03/09/2021. FINDINGS: Cardiovascular: Heart and aorta are normal in size. There is no pericardial effusion. There are atherosclerotic calcifications of the aorta and coronary arteries. Mediastinum/Nodes: No enlarged mediastinal or axillary lymph nodes. Thyroid gland, trachea, and esophagus demonstrate no significant findings. Lungs/Pleura: Bilateral pleuroparenchymal scarring appears unchanged from prior. There are small bilateral pleural effusions, left greater than right. There is airspace consolidation with air bronchograms throughout the entire right lower lobe. Tree-in-bud and ill-defined nodular densities are seen in the right lower lobe and left upper lobe. Calcified granuloma is noted in the right upper lobe. No pneumothorax visualized. Trachea and central airways appear patent. Upper Abdomen: Percutaneous gastrostomy tube present. Musculoskeletal: No acute fractures. IMPRESSION: 1. 1. Airspace consolidation involving the entire left lower lobe compatible with pneumonia. Follow-up imaging recommended in 4-6 weeks to confirm complete resolution and to exclude underlying pathology. 2. Small bilateral pleural effusions. 3. Tree-in-bud and ill-defined nodular densities in the right lower lobe and left upper lobe, likely infectious/inflammatory. 4. Aortic Atherosclerosis (ICD10-I70.0). Electronically Signed   By: Ronney Asters M.D.   On: 07/17/2021 00:54   MR BRAIN WO CONTRAST  Result Date: 07/13/2021 CLINICAL DATA:  Neuro  deficit, acute, stroke suspected. Left upper and lower extremity numbness. History of tonsillar cancer. EXAM: MRI HEAD WITHOUT CONTRAST TECHNIQUE: Multiplanar, multiecho pulse sequences of the brain and surrounding structures were obtained without intravenous contrast. COMPARISON:  Head CT and CTA 07/13/2021 FINDINGS: Brain: There is no evidence of an acute infarct, mass, midline shift, or extra-axial fluid collection. Small T2 hyperintensities in the cerebral white matter bilaterally are nonspecific but compatible with mild chronic small vessel ischemic disease. There may be 1 or 2 chronic microhemorrhages in the left frontal lobe. The ventricles and sulci are within normal limits for age. Vascular: Abnormal appearance of the distal right internal carotid artery corresponding to known occlusion. Skull and upper cervical spine: Unremarkable bone marrow signal. Sinuses/Orbits: Mild bilateral ethmoid air cell mucosal thickening. Small bilateral mastoid effusions. Other: None. IMPRESSION: 1. No acute intracranial abnormality. 2. Mild chronic small vessel ischemic disease. 3. Chronic right ICA occlusion. Electronically Signed   By: Logan Bores M.D.   On: 07/13/2021 08:56   MR CERVICAL SPINE WO CONTRAST  Result Date: 07/13/2021 CLINICAL DATA:  Myelopathy, acute, cervical spine. Left upper and lower extremity numbness. History of tonsillar cancer. EXAM: MRI CERVICAL SPINE WITHOUT CONTRAST TECHNIQUE: Multiplanar, multisequence MR imaging of the cervical spine was performed. No intravenous contrast was administered. COMPARISON:  Cervical spine CT 07/13/2021 FINDINGS: Alignment: Straightening of the normal cervical lordosis. Trace anterolisthesis of C2 on C3. Vertebrae: No acute fracture. Remote mild T2 superior endplate compression fracture. Prominent fatty marrow changes throughout the spine. Cord: 1.5 mm focus of T2 hypointensity in the right dorsal spinal cord at C2-3 with prominent susceptibility artifact  suggesting chronic blood products as there is no calcification visible on CT and no cord edema. Normal cord signal and morphology elsewhere. Posterior Fossa, vertebral arteries, paraspinal tissues: Posterior fossa more fully evaluated on the separate head MRI. Preserved vertebral artery flow voids. Disc levels: C2-3: Anterolisthesis with disc uncovering, uncovertebral spurring, and facet hypertrophy result in mild left neural foraminal stenosis without spinal stenosis. Bilateral facet ankylosis and partial interbody ankylosis. C3-4:  Asymmetric left uncovertebral spurring and asymmetric left facet arthrosis result in severe left neural foraminal stenosis without spinal stenosis. Left facet ankylosis and partial interbody ankylosis. C4-5: Uncovertebral spurring and mild-to-moderate facet arthrosis result in moderate bilateral neural foraminal stenosis without spinal stenosis. C5-6: Bridging anterior vertebral osteophytes. Minimal uncovertebral spurring and mild facet arthrosis without significant stenosis. C6-7: Bridging anterior vertebral osteophytes.  No stenosis. C7-T1: Mild facet arthrosis without stenosis. IMPRESSION: 1. 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 2. Cervical spondylosis and facet arthrosis without spinal stenosis. 3. Severe left neural foraminal stenosis at C3-4 and moderate bilateral neural foraminal stenosis at C4-5. Electronically Signed   By: Logan Bores M.D.   On: 07/13/2021 09:33   CT C-SPINE NO CHARGE  Result Date: 07/13/2021 CLINICAL DATA:  Acute stroke suspected EXAM: CT Cervical Spine with contrast TECHNIQUE: Multiplanar CT images of the cervical spine were reconstructed from contemporary CTA of the Neck. RADIATION DOSE REDUCTION: This exam was performed according to the departmental dose-optimization program which includes automated exposure control, adjustment of the mA and/or kV according to patient size and/or use of iterative reconstruction  technique. CONTRAST:  None additional COMPARISON:  None similar FINDINGS: Alignment: No traumatic malalignment. Skull base and vertebrae: No acute fracture or focal bone lesion. Generalized osteopenia. Remote T2 superior endplate fracture. Soft tissues and spinal canal: Post treatment neck. No perispinal or visible intra canal hemorrhage/collection. Disc levels: Degenerative ankylosis at C2-C4. Degenerative ankylosis at C5-C7. Generalized facet spurring. Up to moderate foraminal narrowing at C4-5 bilaterally. Upper chest: Reported separately IMPRESSION: No evidence of acute injury to the cervical spine. Spinal degeneration and multilevel ankylosis. Electronically Signed   By: Jorje Guild M.D.   On: 07/13/2021 05:07   DG CHEST PORT 1 VIEW  Result Date: 07/14/2021 CLINICAL DATA:  Fever Evaluate atelectasis or infiltrate. pleural effusion. pneumothorax. EXAM: PORTABLE CHEST 1 VIEW COMPARISON:  07/12/2021 and older studies. FINDINGS: Left retrocardiac lung base opacity is similar to the most recent prior exam. Remainder of the lungs is clear. Lungs are hyperexpanded. Possible small left effusion.  No evidence of a pneumothorax. Cardiac silhouette normal in size.  No mediastinal or hilar masses. IMPRESSION: 1. No significant change from the most recent prior study. 2. Left lower lobe, retrocardiac, opacity is consistent with pneumonia, atelectasis or a combination. Electronically Signed   By: Lajean Manes M.D.   On: 07/14/2021 09:29   DG Chest Portable 1 View  Result Date: 07/12/2021 CLINICAL DATA:  Concern for pneumonia. EXAM: PORTABLE CHEST 1 VIEW COMPARISON:  Chest radiograph dated 06/17/2021. FINDINGS: Left retrocardiac consolidation with air bronchograms may represent atelectasis or infiltrate. Clinical correlation recommended. No large pleural effusion. No pneumothorax. Mild cardiomegaly. No acute osseous pathology. IMPRESSION: Left retrocardiac consolidation may represent atelectasis or infiltrate.  Electronically Signed   By: Anner Crete M.D.   On: 07/12/2021 22:11   CT ANGIO HEAD NECK W WO CM (CODE STROKE)  Result Date: 07/13/2021 CLINICAL DATA:  Acute stroke suspected EXAM: CT ANGIOGRAPHY HEAD AND NECK TECHNIQUE: Multidetector CT imaging of the head and neck was performed using the standard protocol during bolus administration of intravenous contrast. Multiplanar CT image reconstructions and MIPs were obtained to evaluate the vascular anatomy. Carotid stenosis measurements (when applicable) are obtained utilizing NASCET criteria, using the distal internal carotid diameter as the denominator. RADIATION DOSE REDUCTION: This exam was performed according to the departmental dose-optimization program which includes automated exposure control, adjustment of the mA and/or kV according to patient  size and/or use of iterative reconstruction technique. CONTRAST:  Dose is not known on this in progress study COMPARISON:  CTA of the neck 06/19/2016 FINDINGS: CTA NECK FINDINGS Aortic arch: Partial coverage shows diffuse atheromatous plaque with 3 vessel branching. Right carotid system: Bulky plaque at the bifurcation with chronic proximal ICA occlusion continuing to the intracranial segments. No interval change Left carotid system: Diffuse atheromatous wall thickening and intermittent calcification. Interval stenting of the proximal left ICA with no evidence of in stent stenosis or new narrowing. Vertebral arteries: Proximal subclavian atherosclerosis without flow reducing stenosis. Bilateral vertebral origin plaque measuring at least 60% on coronal reformats. Mild downstream plaque without significant V2 or V3 segment stenosis. No dissection. Skeleton: Cervical spine degeneration with multilevel ankylosis. 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 Neck dissection on the right at least Upper chest: Biapical radiation fibrosis.  No acute finding. Review of  the MIP images confirms the above findings CTA HEAD FINDINGS Anterior circulation: Reconstituted right ICA at the paraclinoid segment where the ophthalmic arteries enhancing and where there is a large right posterior communicating artery. Anterior communicating artery is also present. No branch occlusion, beading, or aneurysm. Posterior circulation: Right dominant vertebral artery. The vertebral and basilar arteries are smoothly contoured and widely patent. No branch occlusion, beading, or aneurysm. Venous sinuses: Diffusely patent Anatomic variants: None significant Review of the MIP images confirms the above findings IMPRESSION: 1. No emergent finding. 2. 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 Intact circle-of-Willis. 3. Left proximal carotid stenting since prior. No in stent stenosis. 4. 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 Electronically Signed   By: Jorje Guild M.D.   On: 07/13/2021 04:41      Subjective: Patient reports that he is doing quite well.  Describe to me how he ambulated multiple times in the hall with PT assistance back and forth from the 2 nurses stations.  Denied any dizziness.  Tolerating tube feeds.  Having BMs.  Excited to return home. Says he is going to call his brother   Discharge Exam:  Vitals:   07/26/21 2240 07/27/21 0342 07/27/21 0754 07/27/21 1138  BP: 119/72 112/65 98/63 (!) 150/81  Pulse: 61 61 70 75  Resp: '18 18 16 14  ' Temp: 97.8 F (36.6 C) 98.6 F (37 C) 98.5 F (36.9 C) 98.6 F (37 C)  TempSrc: Oral Oral Oral Oral  SpO2: 98% 99% 96% 96%  Weight:  52 kg    Height:        General exam: Live male, moderately built, frail and chronically ill looking sitting up comfortably in bed. Respiratory system: Clear to auscultation. Respiratory effort normal. Cardiovascular system: S1 & S2 heard, RRR. No JVD, murmurs, rubs, gallops or clicks. No pedal edema.   Gastrointestinal system: Abdomen is  nondistended, soft and nontender. No organomegaly or masses felt. Normal bowel sounds heard.  PEG tube site intact without acute findings. Central nervous system: Alert and oriented. No focal neurological deficits. Extremities: Symmetric 5 x 5 power.  Has bilateral knee-high compression stockings/TED hose Skin: No rashes, lesions or ulcers Psychiatry: Judgement and insight appear somewhat impaired.  Mood & affect appropriate.     The results of significant diagnostics from this hospitalization (including imaging, microbiology, ancillary and laboratory) are listed below for reference.     Microbiology: No results found for this or any previous visit (from the past 240 hour(s)).   Labs: CBC:  Recent Labs  Lab 07/23/21 0249 07/24/21 0109 07/25/21 0253 07/26/21 0200 07/27/21 0206  WBC 3.0* 7.5 3.8* 3.5* 3.1*  HGB 8.8* 8.9* 8.4* 8.9* 8.3*  HCT 27.0* 26.8* 25.5* 26.7* 26.0*  MCV 94.7 95.0 95.9 96.7 97.4  PLT 205 197 179 175 532    Basic Metabolic Panel: Recent Labs  Lab 07/21/21 0145 07/22/21 0154 07/23/21 0249 07/24/21 0109 07/25/21 0253 07/26/21 0200 07/27/21 0206  NA 132* 132* 131* 135 139 139 140  K 4.3 4.3 4.0 3.8 4.7 3.9 4.1  CL 98 97* 96* 97* 101 101 104  CO2 '29 30 29 ' 34* 33* 30 30  GLUCOSE 85 99 89 76 99 109* 82  BUN 51* 47* 43* 63* 48* 34* 31*  CREATININE 0.48* 0.47* 0.39* 0.43* 0.53* 0.43* 0.48*  CALCIUM 8.5* 8.2* 8.2* 8.6* 8.8* 8.7* 8.6*  PHOS 2.3* 2.6 2.8  --   --   --   --     Liver Function Tests: Recent Labs  Lab 07/22/21 0154 07/23/21 0249 07/25/21 0253 07/26/21 0200 07/27/21 0206  AST  --   --  52* 78* 79*  ALT  --   --  45* 66* 79*  ALKPHOS  --   --  58 60 62  BILITOT  --   --  0.6 0.5 0.6  PROT  --   --  5.1* 5.6* 5.5*  ALBUMIN 2.7* 2.7* 2.7* 2.9* 2.9*    CBG: Recent Labs  Lab 07/27/21 0011 07/27/21 0434 07/27/21 0823 07/27/21 1142 07/27/21 1244  GLUCAP 79 102* 107* 56* 98     Urinalysis    Component Value Date/Time    COLORURINE YELLOW 07/24/2021 1450   APPEARANCEUR CLOUDY (A) 07/24/2021 1450   LABSPEC 1.019 07/24/2021 1450   PHURINE 8.0 07/24/2021 1450   GLUCOSEU NEGATIVE 07/24/2021 1450   HGBUR NEGATIVE 07/24/2021 1450   BILIRUBINUR NEGATIVE 07/24/2021 1450   KETONESUR NEGATIVE 07/24/2021 1450   PROTEINUR NEGATIVE 07/24/2021 1450   NITRITE NEGATIVE 07/24/2021 1450   LEUKOCYTESUR NEGATIVE 07/24/2021 1450    I discussed with patients spouse in detail, updated care and answered all questions.  Time coordinating discharge: 45 minutes  SIGNED:  Vernell Leep, MD,  FACP, [facility], [facility], [facility] (Care Management Physician Certified). [facility]  To contact the attending provider between 7A-7P or the covering provider during after hours 7P-7A, please log into the web site [email or web address] and access using universal [place] password for that web site. If you do not have the password, please call the hospital operator.

## 2021-07-27 NOTE — Progress Notes (Signed)
Physical Therapy Treatment Patient Details Name: Alexander Duran MRN: [id number] DOB: [birth year] Today's Date: 07/27/2021   History of Present Illness Pt is a 76 y/o male presenting on 1/26 with diarrhea.  Admitted with dehydration, complicated by generalized weakness and hyponatremia. Noted recent admission 06/17/21 for covid infection. L sided numbness and hypotension began 1/27, code stroke activated. CT/CTA negative. MRI brain/cervical spine negative for acute abnormalities. 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    PT Comments    Pt is continuing to be agreeable to participate with therapy at this time, mobilizing well using a RW without need for physical assistance. Pt is at risk for falls, but ambulates with good stability and no LOB while using the RW. Will continue to follow acutely. Current recommendations remain appropriate.    Recommendations for follow up therapy are one component of a multi-disciplinary discharge planning process, led by the attending physician.  Recommendations may be updated based on patient status, additional functional criteria and insurance authorization.  Follow Up Recommendations  Home health PT (planning home with hospice?)     Assistance Recommended at Discharge Frequent or constant Supervision/Assistance  Patient can return home with the following Direct supervision/assist for medications management;Direct supervision/assist for financial management;Assist for transportation;Help with stairs or ramp for entrance;Assistance with cooking/housework;A little help with walking and/or transfers;A little help with bathing/dressing/bathroom   Equipment Recommendations  Hospital bed (hospital bed with overlay mattress to protect skin/further wounds from developing)    Recommendations for Other Services       Precautions / Restrictions Precautions Precautions: Fall Precaution Comments:  g-tube Restrictions Weight Bearing Restrictions: No     Mobility  Bed Mobility Overal bed mobility: Needs Assistance Bed Mobility: Rolling, Sidelying to Sit Rolling: Supervision Sidelying to sit: Supervision, HOB elevated       General bed mobility comments: Extra time and effort, supervision for safety using bed rail with HOB elevated.    Transfers Overall transfer level: Needs assistance Equipment used: Rolling walker (2 wheels) Transfers: Sit to/from Stand Sit to Stand: Supervision           General transfer comment: Pt coming to stand from EOB with supervision, no LOB, but mild trunk sway noted.    Ambulation/Gait Ambulation/Gait assistance: Supervision Gait Distance (Feet): 240 Feet Assistive device: Rolling walker (2 wheels) Gait Pattern/deviations: Step-through pattern, Decreased step length - right, Decreased step length - left, Trunk flexed, Decreased stride length Gait velocity: reduced Gait velocity interpretation: <1.31 ft/sec, indicative of household ambulator   General Gait Details: Pt with kyphotic posture and slow, mostly steady gait. No LOB, supervision for safety with mobility in close spaces in the room and in open, busy spaces in hallway. Cues provided to increase bil feet clearance, min momentary success.   Stairs             Wheelchair Mobility    Modified Rankin (Stroke Patients Only)       Balance Overall balance assessment: Needs assistance Sitting-balance support: Feet supported, No upper extremity supported Sitting balance-Leahy Scale: Fair     Standing balance support: Bilateral upper extremity supported, During functional activity, No upper extremity supported Standing balance-Leahy Scale: Fair Standing balance comment: Uses RW for mobility but can stand without UE support                            Cognition Arousal/Alertness: Awake/alert Behavior During Therapy: WFL for tasks assessed/performed Overall  Cognitive Status: No family/caregiver present to determine baseline cognitive functioning Area of Impairment: Memory, Problem solving                     Memory: Decreased short-term memory       Problem Solving: Slow processing General Comments: Pt with slow processing at times, overall following directions appropriately. Pt adament about taking himself to bathroom without any help or waiting for nursing despite education on safety, RN notified        Exercises General Exercises - Lower Extremity Hip ABduction/ADduction: AROM, Strengthening, Both, 10 reps, Standing (with RW) Hip Flexion/Marching: AROM, Strengthening, Both, 10 reps, Standing (with RW) Other Exercises Other Exercises: sit <> stand from recliner with arm rests, 5x Other Exercises: Standing hamstring curls with RW, 10x bil    General Comments        Pertinent Vitals/Pain Pain Assessment Pain Assessment: Faces Faces Pain Scale: No hurt Pain Intervention(s): Monitored during session    Home Living                          Prior Function            PT Goals (current goals can now be found in the care plan section) Acute Rehab PT Goals Patient Stated Goal: to go home PT Goal Formulation: With patient Time For Goal Achievement: 08/09/21 Potential to Achieve Goals: Fair Progress towards PT goals: Progressing toward goals    Frequency    Min 3X/week      PT Plan Current plan remains appropriate    Co-evaluation              AM-PAC PT "6 Clicks" Mobility   Outcome Measure  Help needed turning from your back to your side while in a flat bed without using bedrails?: A Little Help needed moving from lying on your back to sitting on the side of a flat bed without using bedrails?: A Little Help needed moving to and from a bed to a chair (including a wheelchair)?: A Little Help needed standing up from a chair using your arms (e.g., wheelchair or bedside chair)?: A Little Help  needed to walk in hospital room?: A Little Help needed climbing 3-5 steps with a railing? : A Little 6 Click Score: 18    End of Session   Activity Tolerance: Patient tolerated treatment well Patient left: in chair;with call bell/phone within reach Nurse Communication: Mobility status;Other (comment) (pt refusing to call nursing for help with using bathroom) PT Visit Diagnosis: Other abnormalities of gait and mobility (R26.89);Muscle weakness (generalized) (M62.81);Unsteadiness on feet (R26.81);Difficulty in walking, not elsewhere classified (R26.2)     Time: 8250-5397 PT Time Calculation (min) (ACUTE ONLY): 14 min  Charges:  $Gait Training: 8-22 mins                     Moishe Spice, PT, DPT Acute Rehabilitation Services  Pager: [phone] Office: [place] 07/27/2021, 11:08 AM

## 2021-07-27 NOTE — Progress Notes (Signed)
Hypoglycemic Event  CBG: 56  Treatment: D50 25 mL (12.5 gm)  Symptoms: None  Follow-up CBG: Time:1245 CBG Result:98  Possible Reasons for Event: Inadequate meal intake, pt had not had lunchtime tube feed yet.   Clyde Canterbury, RN

## 2021-07-27 NOTE — Progress Notes (Signed)
Occupational Therapy Treatment Patient Details Name: Alexander Duran MRN: [id number] DOB: [birth year] Today's Date: 07/27/2021   History of present illness Pt is a 76 y/o male presenting on 1/26 with diarrhea.  Admitted with dehydration, complicated by generalized weakness and hyponatremia. Noted recent admission 06/17/21 for covid infection. L sided numbness and hypotension began 1/27, code stroke activated. CT/CTA negative. MRI brain/cervical spine negative for acute abnormalities. 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   OT comments  Patient continues to make steady progress towards goals in skilled OT session. Patient's session encompassed  functional mobility and ADLs in standing at sink. Patient with L nose bleed upon therapist's entry into room, (patient stating this happens frequently to patient) nursing notified at end of session. Patient able to complete grooming tasks in standing for 5-7 minutes without need for rest break. Patient wanting to return to bed at end of session, and was supervision for bed mobility. Therapy will continue to follow; discharge remains appropriate.    Recommendations for follow up therapy are one component of a multi-disciplinary discharge planning process, led by the attending physician.  Recommendations may be updated based on patient status, additional functional criteria and insurance authorization.    Follow Up Recommendations  Home health OT    Assistance Recommended at Discharge Frequent or constant Supervision/Assistance  Patient can return home with the following  A little help with walking and/or transfers;A little help with bathing/dressing/bathroom;Assistance with cooking/housework;Direct supervision/assist for medications management;Assist for transportation   Equipment Recommendations  Wheelchair (measurements OT);Wheelchair cushion (measurements OT);Hospital bed    Recommendations for Other  Services      Precautions / Restrictions Precautions Precautions: Fall Precaution Comments: g-tube Restrictions Weight Bearing Restrictions: No       Mobility Bed Mobility Overal bed mobility: Needs Assistance Bed Mobility: Sit to Supine       Sit to supine: Supervision   General bed mobility comments: supervision for safety    Transfers Overall transfer level: Needs assistance Equipment used: Rolling walker (2 wheels) Transfers: Sit to/from Stand Sit to Stand: Supervision                 Balance Overall balance assessment: Needs assistance Sitting-balance support: Feet supported, No upper extremity supported Sitting balance-Leahy Scale: Fair     Standing balance support: Bilateral upper extremity supported, During functional activity, No upper extremity supported Standing balance-Leahy Scale: Fair Standing balance comment: Uses RW for mobility but can stand without UE support, occasionally propping UE on sink for support                           ADL either performed or assessed with clinical judgement   ADL Overall ADL's : Needs assistance/impaired     Grooming: Wash/dry hands;Wash/dry face;Oral care;Standing Grooming Details (indicate cue type and reason): standing at sink                             Functional mobility during ADLs: Supervision/safety General ADL Comments: patient completing grooming tasks at sink prior to ambulating back to bed    Extremity/Trunk Assessment              Vision       Perception     Praxis      Cognition Arousal/Alertness: Awake/alert Behavior During Therapy: WFL for tasks assessed/performed Overall Cognitive Status: Within Functional Limits for tasks assessed  General Comments: Responsive and appropriate in session        Exercises      Shoulder Instructions       General Comments      Pertinent Vitals/ Pain       Pain  Assessment Pain Assessment: Faces Faces Pain Scale: No hurt  Home Living                                          Prior Functioning/Environment              Frequency  Min 2X/week        Progress Toward Goals  OT Goals(current goals can now be found in the care plan section)  Progress towards OT goals: Progressing toward goals  Acute Rehab OT Goals Patient Stated Goal: home to spend what time I have there before I have to go to hospice OT Goal Formulation: With patient Time For Goal Achievement: 08/08/21 Potential to Achieve Goals: [place] Frequency remains appropriate    Co-evaluation                 AM-PAC OT "6 Clicks" Daily Activity     Outcome Measure   Help from another person eating meals?: Total (NPO) Help from another person taking care of personal grooming?: A Little Help from another person toileting, which includes using toliet, bedpan, or urinal?: A Little Help from another person bathing (including washing, rinsing, drying)?: A Little Help from another person to put on and taking off regular upper body clothing?: A Little Help from another person to put on and taking off regular lower body clothing?: A Little 6 Click Score: 16    End of Session Equipment Utilized During Treatment: Rolling walker (2 wheels)  OT Visit Diagnosis: Other abnormalities of gait and mobility (R26.89);Muscle weakness (generalized) (M62.81);Pain   Activity Tolerance Patient tolerated treatment well   Patient Left in bed;with call bell/phone within reach   Nurse Communication Mobility status;Other (comment) (Nose bleed L nostril)        Time: 1005-1020 OT Time Calculation (min): 15 min  Charges: OT General Charges $OT Visit: 1 Visit OT Treatments $Self Care/Home Management : 8-22 mins  Corinne Ports E. Yassmine Tamm, OTR/L Acute Rehabilitation Services [phone] [place] 07/27/2021, 11:23 AM

## 2021-07-27 NOTE — Progress Notes (Signed)
D/C instructions given to patient. Medications and wound care reviewed. All questions answered. IV removed, clean and intact. TOC medications delivered to room. Brother to escort pt home.  Clyde Canterbury, RN

## 2021-08-14 ENCOUNTER — Encounter (HOSPITAL_COMMUNITY): Payer: Self-pay | Admitting: Radiology

## 2021-10-02 ENCOUNTER — Other Ambulatory Visit (HOSPITAL_COMMUNITY): Payer: Self-pay

## 2021-11-06 ENCOUNTER — Inpatient Hospital Stay (HOSPITAL_COMMUNITY)
Admission: EM | Admit: 2021-11-06 | Discharge: 2021-11-08 | DRG: 640 | Disposition: A | Discharge status: Home / Self Care | Payer: No Typology Code available for payment source | Attending: Family Medicine | Admitting: Family Medicine

## 2021-11-06 ENCOUNTER — Other Ambulatory Visit: Payer: Self-pay

## 2021-11-06 ENCOUNTER — Encounter (HOSPITAL_COMMUNITY): Payer: Self-pay

## 2021-11-06 DIAGNOSIS — I10 Essential (primary) hypertension: Secondary | ICD-10-CM | POA: Diagnosis present

## 2021-11-06 DIAGNOSIS — C099 Malignant neoplasm of tonsil, unspecified: Secondary | ICD-10-CM

## 2021-11-06 DIAGNOSIS — Z923 Personal history of irradiation: Secondary | ICD-10-CM

## 2021-11-06 DIAGNOSIS — Z66 Do not resuscitate: Secondary | ICD-10-CM | POA: Diagnosis present

## 2021-11-06 DIAGNOSIS — Z87891 Personal history of nicotine dependence: Secondary | ICD-10-CM

## 2021-11-06 DIAGNOSIS — R5383 Other fatigue: Secondary | ICD-10-CM

## 2021-11-06 DIAGNOSIS — Z931 Gastrostomy status: Secondary | ICD-10-CM

## 2021-11-06 DIAGNOSIS — Z79890 Hormone replacement therapy (postmenopausal): Secondary | ICD-10-CM

## 2021-11-06 DIAGNOSIS — Z825 Family history of asthma and other chronic lower respiratory diseases: Secondary | ICD-10-CM

## 2021-11-06 DIAGNOSIS — E878 Other disorders of electrolyte and fluid balance, not elsewhere classified: Secondary | ICD-10-CM | POA: Diagnosis present

## 2021-11-06 DIAGNOSIS — Z79899 Other long term (current) drug therapy: Secondary | ICD-10-CM

## 2021-11-06 DIAGNOSIS — E162 Hypoglycemia, unspecified: Secondary | ICD-10-CM | POA: Diagnosis present

## 2021-11-06 DIAGNOSIS — E43 Unspecified severe protein-calorie malnutrition: Secondary | ICD-10-CM | POA: Diagnosis present

## 2021-11-06 DIAGNOSIS — E039 Hypothyroidism, unspecified: Secondary | ICD-10-CM | POA: Diagnosis present

## 2021-11-06 DIAGNOSIS — E871 Hypo-osmolality and hyponatremia: Secondary | ICD-10-CM | POA: Diagnosis not present

## 2021-11-06 DIAGNOSIS — Z85818 Personal history of malignant neoplasm of other sites of lip, oral cavity, and pharynx: Secondary | ICD-10-CM

## 2021-11-06 DIAGNOSIS — Z801 Family history of malignant neoplasm of trachea, bronchus and lung: Secondary | ICD-10-CM

## 2021-11-06 DIAGNOSIS — Z7982 Long term (current) use of aspirin: Secondary | ICD-10-CM

## 2021-11-06 DIAGNOSIS — D72819 Decreased white blood cell count, unspecified: Secondary | ICD-10-CM | POA: Diagnosis present

## 2021-11-06 LAB — CBC WITH DIFFERENTIAL/PLATELET
Abs Immature Granulocytes: 0.01 10*3/uL (ref 0.00–0.07)
Basophils Absolute: 0 10*3/uL (ref 0.0–0.1)
Basophils Relative: 1 %
Eosinophils Absolute: 0.1 10*3/uL (ref 0.0–0.5)
Eosinophils Relative: 2 %
HCT: 32.1 % — ABNORMAL LOW (ref 39.0–52.0)
Hemoglobin: 11.4 g/dL — ABNORMAL LOW (ref 13.0–17.0)
Immature Granulocytes: 0 %
Lymphocytes Relative: 17 %
Lymphs Abs: 0.6 10*3/uL — ABNORMAL LOW (ref 0.7–4.0)
MCH: 34.3 pg — ABNORMAL HIGH (ref 26.0–34.0)
MCHC: 35.5 g/dL (ref 30.0–36.0)
MCV: 96.7 fL (ref 80.0–100.0)
Monocytes Absolute: 0.4 10*3/uL (ref 0.1–1.0)
Monocytes Relative: 13 %
Neutro Abs: 2.2 10*3/uL (ref 1.7–7.7)
Neutrophils Relative %: 67 %
Platelets: 227 10*3/uL (ref 150–400)
RBC: 3.32 MIL/uL — ABNORMAL LOW (ref 4.22–5.81)
RDW: 12.2 % (ref 11.5–15.5)
WBC: 3.3 10*3/uL — ABNORMAL LOW (ref 4.0–10.5)
nRBC: 0 % (ref 0.0–0.2)

## 2021-11-06 LAB — URINALYSIS, ROUTINE W REFLEX MICROSCOPIC
Bilirubin Urine: NEGATIVE
Glucose, UA: NEGATIVE mg/dL
Hgb urine dipstick: NEGATIVE
Ketones, ur: NEGATIVE mg/dL
Leukocytes,Ua: NEGATIVE
Nitrite: NEGATIVE
Protein, ur: NEGATIVE mg/dL
Specific Gravity, Urine: 1.012 (ref 1.005–1.030)
pH: 8 (ref 5.0–8.0)

## 2021-11-06 LAB — BASIC METABOLIC PANEL
Anion gap: 7 (ref 5–15)
BUN: 22 mg/dL (ref 8–23)
CO2: 26 mmol/L (ref 22–32)
Calcium: 8.2 mg/dL — ABNORMAL LOW (ref 8.9–10.3)
Chloride: 93 mmol/L — ABNORMAL LOW (ref 98–111)
Creatinine, Ser: 0.42 mg/dL — ABNORMAL LOW (ref 0.61–1.24)
GFR, Estimated: >60 mL/min (ref >60)
Glucose, Bld: 66 mg/dL — ABNORMAL LOW (ref 70–99)
Potassium: 4.2 mmol/L (ref 3.5–5.1)
Sodium: 126 mmol/L — ABNORMAL LOW (ref 135–145)

## 2021-11-06 LAB — HEPATIC FUNCTION PANEL
ALT: 20 U/L (ref 0–44)
AST: 47 U/L — ABNORMAL HIGH (ref 15–41)
Albumin: 3.7 g/dL (ref 3.5–5.0)
Alkaline Phosphatase: 77 U/L (ref 38–126)
Bilirubin, Direct: 0.3 mg/dL — ABNORMAL HIGH (ref 0.0–0.2)
Indirect Bilirubin: 0.5 mg/dL (ref 0.3–0.9)
Total Bilirubin: 0.8 mg/dL (ref 0.3–1.2)
Total Protein: 6.7 g/dL (ref 6.5–8.1)

## 2021-11-06 LAB — CBG MONITORING, ED: Glucose-Capillary: 77 mg/dL (ref 70–99)

## 2021-11-06 LAB — TSH: TSH: 9.177 u[IU]/mL — ABNORMAL HIGH (ref 0.350–4.500)

## 2021-11-06 MED ORDER — LEVOTHYROXINE SODIUM 100 MCG PO TABS
100.0000 ug | ORAL_TABLET | Freq: Every day | ORAL | Status: DC
  Administered 2021-11-07 – 2021-11-08 (×2): 100 ug
  Filled 2021-11-06 (×2): qty 1

## 2021-11-06 MED ORDER — ENOXAPARIN SODIUM 40 MG/0.4ML IJ SOSY
40.0000 mg | PREFILLED_SYRINGE | INTRAMUSCULAR | Status: DC
  Administered 2021-11-06 – 2021-11-07 (×2): 40 mg via SUBCUTANEOUS
  Filled 2021-11-06 (×2): qty 0.4

## 2021-11-06 MED ORDER — SODIUM CHLORIDE 0.9 % IV BOLUS
1000.0000 mL | Freq: Once | INTRAVENOUS | Status: AC
Start: 2021-11-06 — End: 2021-11-06
  Administered 2021-11-06: 1000 mL via INTRAVENOUS

## 2021-11-06 MED ORDER — ALBUTEROL SULFATE (2.5 MG/3ML) 0.083% IN NEBU: 2.5000 mg | INHALATION_SOLUTION | Freq: Four times a day (QID) | RESPIRATORY_TRACT | Status: DC | PRN

## 2021-11-06 MED ORDER — ACETAMINOPHEN 325 MG PO TABS: 650.0000 mg | ORAL_TABLET | Freq: Four times a day (QID) | ORAL | Status: DC | PRN

## 2021-11-06 MED ORDER — ACETAMINOPHEN 650 MG RE SUPP: 650.0000 mg | Freq: Four times a day (QID) | RECTAL | Status: DC | PRN

## 2021-11-06 MED ORDER — SODIUM CHLORIDE 0.9 % IV SOLN: INTRAVENOUS | Status: AC

## 2021-11-06 MED ORDER — SODIUM CHLORIDE 0.9% FLUSH
3.0000 mL | Freq: Two times a day (BID) | INTRAVENOUS | Status: DC
  Administered 2021-11-06 – 2021-11-07 (×3): 3 mL via INTRAVENOUS

## 2021-11-06 MED ORDER — ONDANSETRON HCL 4 MG PO TABS: 4.0000 mg | ORAL_TABLET | Freq: Four times a day (QID) | ORAL | Status: DC | PRN

## 2021-11-06 MED ORDER — ASPIRIN 81 MG PO CHEW
81.0000 mg | CHEWABLE_TABLET | Freq: Every evening | ORAL | Status: DC
  Administered 2021-11-06 – 2021-11-07 (×2): 81 mg
  Filled 2021-11-06 (×2): qty 1

## 2021-11-06 MED ORDER — ONDANSETRON HCL 4 MG/2ML IJ SOLN: 4.0000 mg | Freq: Four times a day (QID) | INTRAMUSCULAR | Status: DC | PRN

## 2021-11-06 NOTE — Plan of Care (Signed)

## 2021-11-06 NOTE — H&P (Signed)
History and Physical    Alexander Duran:[id number] DOB: 1945/07/11 DOA: 11/06/2021  PCP: Windy Fast, MD   Patient coming from: Home   Chief Complaint: low sodium   HPI: Alexander Duran is a pleasant 76 y.o. 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  The patient states that he saw his PCP at the [place] today for routine follow-up, had blood work, and was on his way home when he was reached by phone and directed to the ED for evaluation of hyponatremia.  Serum sodium was said to be 124.  Patient has had some fatigue since January without acute worsening, has a mild headache on occasion, but no acute changes.  He flushes his feeding tube before and after feeds, 3 times daily, but is unsure of what volume he uses.  He was recently told that he could put any fluids that do not clump in the tube, and has been putting juices, ice coffee, and other drinks through the tube.  ED Course: Upon arrival to the ED, patient is found to be afebrile and saturating well on room air with stable blood pressure.  Chemistry panel notable for sodium 126 and glucose 66.  TSH is 9.177.  Patient was given a liter of saline in the ED.  Review of Systems:  All other systems reviewed and apart from HPI, are negative.  Past Medical History:  Diagnosis Date   Arthritis    Cancer ([place])    Tonsil cancer   Carotid artery stenosis without cerebral infarction 08/30/2015   Difficulty in swallowing    History of radiation therapy    Hypertension    Hypothyroidism 05/09/2014   Other headache syndrome 08/10/2015   Sleep apnea    Weight loss 08/08/2015    Past Surgical History:  Procedure Laterality Date   BACK SURGERY     CAROTID PTA/STENT INTERVENTION Left 08/02/2016   Procedure: Carotid PTA/Stent Intervention;   Surgeon: Elam Dutch, MD;  Location: [place] [facility];  Service: Cardiovascular;  Laterality: Left;   ESOPHAGOGASTRODUODENOSCOPY N/A 11/15/2016   Procedure: ESOPHAGOGASTRODUODENOSCOPY (EGD);  Surgeon: Doran Stabler, MD;  Location: [facility];  Service: Endoscopy;  Laterality: N/A;   FOREIGN BODY REMOVAL N/A 11/15/2016   Procedure: FOREIGN BODY REMOVAL;  Surgeon: Doran Stabler, MD;  Location: [place];  Service: Endoscopy;  Laterality: N/A;   LAPAROSCOPIC GASTROSTOMY N/A 06/21/2021   Procedure: LAPAROSCOPIC GASTROSTOMY TUBE PLACEMENT;  Surgeon: Clovis Riley, MD;  Location: [place];  Service: General;  Laterality: N/A;   PERIPHERAL VASCULAR CATHETERIZATION N/A 07/04/2016   Procedure: Aortic Arch Angiography;  Surgeon: Conrad Goodville, MD;  Location: [place] [facility];  Service: Cardiovascular;  Laterality: N/A;   PERIPHERAL VASCULAR CATHETERIZATION N/A 07/04/2016   Procedure: Carotid & cerebral  Angiography;  Surgeon: Conrad Elberon, MD;  Location: [place] [facility];  Service: Cardiovascular;  Laterality: N/A;   TOTAL HIP ARTHROPLASTY Left 10/26/2019   Procedure: TOTAL HIP ARTHROPLASTY ANTERIOR APPROACH;  Surgeon: Paralee Cancel, MD;  Location: [facility];  Service: Orthopedics;  Laterality: Left;  70 mins   TUMOR REMOVAL     in neck    Social History:   reports that he quit smoking about 4 years ago. His smoking use included cigarettes. He has a 35.00 pack-year smoking history. He has never used  smokeless tobacco. He reports that he does not currently use alcohol after a past usage of about 2.0 standard drinks per week. He reports that he does not currently use drugs after having used the following drugs: Marijuana.  Allergies  Allergen Reactions   Iodinated Contrast Media     Other reaction(s): Weakness present    Family History  Problem Relation Age of Onset   Cancer Father        lung cancer   COPD Mother    Macular degeneration Mother    Colon cancer Neg Hx    Stomach  cancer Neg Hx    Pancreatic cancer Neg Hx      Prior to Admission medications   Medication Sig Start Date End Date Taking? Authorizing Provider  albuterol (VENTOLIN HFA) 108 (90 Base) MCG/ACT inhaler Inhale 2 puffs into the lungs every 6 (six) hours as needed for wheezing or shortness of breath.   Yes [provider]  aspirin 81 MG chewable tablet Place 1 tablet (81 mg total) into feeding tube every evening. 07/27/21  Yes Hongalgi, Lenis Dickinson, MD  levothyroxine (SYNTHROID) 100 MCG tablet Place 1 tablet (100 mcg total) into feeding tube daily before breakfast. 07/27/21  Yes Hongalgi, Lenis Dickinson, MD  Accu-Chek Softclix Lancets lancets use as directed up to 4 times daily 07/27/21   Hongalgi, Lenis Dickinson, MD  acetaminophen (TYLENOL) 160 MG/5ML suspension Place 20.3 mLs (650 mg total) into feeding tube every 8 (eight) hours as needed for mild pain. Patient not taking: Reported on 07/14/2021 06/27/21   Jonetta Osgood, MD  atorvastatin (LIPITOR) 20 MG tablet Place 1 tablet (20 mg total) into feeding tube daily. Patient not taking: Reported on 11/06/2021 07/27/21   Modena Jansky, MD  Blood Glucose Monitoring Suppl (BLOOD GLUCOSE MONITOR SYSTEM) w/Device KIT Use as directed up to 4 times daily 07/27/21   Modena Jansky, MD  esomeprazole (NEXIUM) 40 MG capsule Place 1 capsule in NG tube twice daily. See admin instructions attached Patient not taking: Reported on 11/06/2021 07/27/21   Modena Jansky, MD  fludrocortisone (FLORINEF) 0.1 MG tablet Place 2 tablets (0.2 mg total) into feeding tube daily. Patient not taking: Reported on 11/06/2021 07/28/21   Modena Jansky, MD  gabapentin (NEURONTIN) 250 MG/5ML solution Place 2 mLs (100 mg total) into feeding tube 2 (two) times daily. Patient not taking: Reported on 11/06/2021 07/27/21   Vernell Leep D, MD  glucose blood test strip Use as directed up to 4 times daily 07/27/21   Modena Jansky, MD  midodrine (PROAMATINE) 10 MG tablet Place 1 tablet (10  mg total) into feeding tube 3 (three) times daily with meals. Patient not taking: Reported on 11/06/2021 07/27/21   Modena Jansky, MD  Multiple Vitamin (MULTIVITAMIN WITH MINERALS) TABS tablet Place 1 tablet into feeding tube daily. Patient not taking: Reported on 11/06/2021 07/28/21   Modena Jansky, MD  Water For Irrigation, Sterile (FREE WATER) SOLN Give 60 ml water flush before and after each feeding. 07/27/21   Modena Jansky, MD    Physical Exam: Vitals:   11/06/21 2000 11/06/21 2130 11/06/21 2201 11/06/21 2201  BP: (!) 142/76 (!) 141/93  (!) 145/78  Pulse: (!) 51 (!) 53  (!) 58  Resp: '10 10  18  ' Temp:  98 F (36.7 C)  97.7 F (36.5 C)  TempSrc:  Oral  Oral  SpO2: 100% 99%  100%  Weight:      Height:  '5\' 6"'  (1.676 m)     Constitutional: NAD, calm  Eyes: PERTLA, lids and conjunctivae normal ENMT: Mucous membranes are moist. Posterior pharynx clear of any exudate or lesions.   Neck: supple, gross deformity  Respiratory: clear to auscultation bilaterally, no wheezing, no crackles. No accessory muscle use.  Cardiovascular: S1 & S2 heard, regular rate and rhythm. Trace ankle edema.   Abdomen: No distension, no tenderness, soft. Bowel sounds active.  Musculoskeletal: no clubbing / cyanosis. No joint deformity upper and lower extremities.   Skin: no significant rashes, lesions, ulcers. Warm, dry, well-perfused. Neurologic: PERRL, EOMI. Moving all extremities. Alert and oriented.  Psychiatric: Very pleasant. Cooperative.    Labs and Imaging on Admission: I have personally reviewed following labs and imaging studies  CBC: Recent Labs  Lab 11/06/21 1825  WBC 3.3*  NEUTROABS 2.2  HGB 11.4*  HCT 32.1*  MCV 96.7  PLT 891   Basic Metabolic Panel: Recent Labs  Lab 11/06/21 1825  NA 126*  K 4.2  CL 93*  CO2 26  GLUCOSE 66*  BUN 22  CREATININE 0.42*  CALCIUM 8.2*   GFR: Estimated Creatinine Clearance: 63.3 mL/min (A) (by C-G formula based on SCr of 0.42 mg/dL  (L)). Liver Function Tests: No results for input(s): AST, ALT, ALKPHOS, BILITOT, PROT, ALBUMIN in the last 168 hours. No results for input(s): LIPASE, AMYLASE in the last 168 hours. No results for input(s): AMMONIA in the last 168 hours. Coagulation Profile: No results for input(s): INR, PROTIME in the last 168 hours. Cardiac Enzymes: No results for input(s): CKTOTAL, CKMB, CKMBINDEX, TROPONINI in the last 168 hours. BNP (last 3 results) No results for input(s): PROBNP in the last 8760 hours. HbA1C: No results for input(s): HGBA1C in the last 72 hours. CBG: Recent Labs  Lab 11/06/21 2144  GLUCAP 77   Lipid Profile: No results for input(s): CHOL, HDL, LDLCALC, TRIG, CHOLHDL, LDLDIRECT in the last 72 hours. Thyroid Function Tests: Recent Labs    11/06/21 2030  TSH 9.177*   Anemia Panel: No results for input(s): VITAMINB12, FOLATE, FERRITIN, TIBC, IRON, RETICCTPCT in the last 72 hours. Urine analysis:    Component Value Date/Time   COLORURINE YELLOW 11/06/2021 1825   APPEARANCEUR HAZY (A) 11/06/2021 1825   LABSPEC 1.012 11/06/2021 1825   PHURINE 8.0 11/06/2021 1825   GLUCOSEU NEGATIVE 11/06/2021 1825   HGBUR NEGATIVE 11/06/2021 1825   BILIRUBINUR NEGATIVE 11/06/2021 1825   KETONESUR NEGATIVE 11/06/2021 1825   PROTEINUR NEGATIVE 11/06/2021 1825   NITRITE NEGATIVE 11/06/2021 1825   LEUKOCYTESUR NEGATIVE 11/06/2021 1825   Sepsis Labs: '@LABRCNTIP' (procalcitonin:4,lacticidven:4) )No results found for this or any previous visit (from the past 240 hour(s)).   Radiological Exams on Admission: No results found.  Assessment/Plan  1. Hyponatremia  - Presents at the direction of his PCP for serum sodium of 124 on routine labs  - He has had fatigue since January 2023, no severe symptoms  - Sodium is 126 in ED where he was given 1 liter NS  - Suspect this is secondary to increased free-water intake - He is unsure what volume of feeding tube flush he uses but was recently told  he could put any liquids that do not clump through his feeding tube and has been putting juices, iced coffee, and other drinks in  - Restrict free water, continue NS infusion overnight, repeat chem panel in am, consult dietary    2. Hypothyroidism  - TSH is 9.177 in ED  - Check free T4, continue  current dose Synthroid for now    3. Hypoglycemia  - Mild, asymptomatic  - Check CBGs, treat as needed    4. 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 Discussed with patient on admission.  Level of Care: Level of care: Telemetry Family Communication: none present  Disposition Plan:  Patient is from: home  Anticipated d/c is to: Home  Anticipated d/c date is: 5/24 or 11/08/21  Patient currently: Pending correction of hyponatremia, dietary consultation  Consults called: none  Admission status: Observation     Vianne Bulls, MD [facility]  11/06/2021, 10:09 PM

## 2021-11-06 NOTE — ED Provider Notes (Signed)
[facility] DEPT Provider Note   CSN: [id number] Arrival date & time: 11/06/21  1735     History  Chief Complaint  Patient presents with   Abnormal Lab    Alexander Duran is a 76 y.o. male.  The history is provided by the patient and medical records. No language interpreter was used.  Abnormal Lab Patient referred by:  PCP Resulting agency:  External Result type: chemistry   Chemistry:    Sodium:  Low     Home Medications Prior to Admission medications   Medication Sig Start Date End Date Taking? Authorizing Provider  Accu-Chek Softclix Lancets lancets use as directed up to 4 times daily 07/27/21   Hongalgi, Lenis Dickinson, MD  acetaminophen (TYLENOL) 160 MG/5ML suspension Place 20.3 mLs (650 mg total) into feeding tube every 8 (eight) hours as needed for mild pain. Patient not taking: Reported on 07/14/2021 06/27/21   Jonetta Osgood, MD  albuterol (VENTOLIN HFA) 108 (90 Base) MCG/ACT inhaler Inhale 2 puffs into the lungs every 6 (six) hours as needed for wheezing or shortness of breath.    [provider]  aspirin 81 MG chewable tablet Place 1 tablet (81 mg total) into feeding tube every evening. 07/27/21   Hongalgi, Lenis Dickinson, MD  atorvastatin (LIPITOR) 20 MG tablet Place 1 tablet (20 mg total) into feeding tube daily. 07/27/21   Hongalgi, Lenis Dickinson, MD  Blood Glucose Monitoring Suppl (BLOOD GLUCOSE MONITOR SYSTEM) w/Device KIT Use as directed up to 4 times daily 07/27/21   Modena Jansky, MD  esomeprazole (NEXIUM) 40 MG capsule Place 1 capsule in NG tube twice daily. See admin instructions attached 07/27/21   Hongalgi, Lenis Dickinson, MD  fludrocortisone (FLORINEF) 0.1 MG tablet Place 2 tablets (0.2 mg total) into feeding tube daily. 07/28/21   Hongalgi, Lenis Dickinson, MD  gabapentin (NEURONTIN) 250 MG/5ML solution Place 2 mLs (100 mg total) into feeding tube 2 (two) times daily. 07/27/21   Hongalgi, Everlene Farrier D, MD  glucose blood test strip Use as directed up to  4 times daily 07/27/21   Modena Jansky, MD  levothyroxine (SYNTHROID) 100 MCG tablet Place 1 tablet (100 mcg total) into feeding tube daily before breakfast. 07/27/21   Hongalgi, Lenis Dickinson, MD  midodrine (PROAMATINE) 10 MG tablet Place 1 tablet (10 mg total) into feeding tube 3 (three) times daily with meals. 07/27/21   Hongalgi, Lenis Dickinson, MD  Multiple Vitamin (MULTIVITAMIN WITH MINERALS) TABS tablet Place 1 tablet into feeding tube daily. 07/28/21   Hongalgi, Lenis Dickinson, MD  OVER THE COUNTER MEDICATION TheraTears Liquid Eye Drops- Place 1-2 drops into both eyes one to three times as day as needed for dryness or irritation    [provider]  Water For Irrigation, Sterile (FREE WATER) SOLN Give 60 ml water flush before and after each feeding. 07/27/21   Hongalgi, Lenis Dickinson, MD      Allergies    Iodinated contrast media    Review of Systems   Review of Systems  Constitutional:  Positive for fatigue. Negative for chills, diaphoresis and fever.  HENT:  Negative for congestion.   Respiratory:  Negative for cough, chest tightness, shortness of breath and wheezing.   Cardiovascular:  Negative for chest pain, palpitations and leg swelling.  Gastrointestinal:  Negative for abdominal pain, constipation, diarrhea, nausea and vomiting.  Genitourinary:  Negative for dysuria and flank pain.  Musculoskeletal:  Negative for back pain.  Skin:  Negative for rash and wound.  Neurological:  Negative for dizziness, light-headedness and headaches.  Psychiatric/Behavioral:  Negative for agitation and confusion.   All other systems reviewed and are negative.  Physical Exam Updated Vital Signs BP (!) 152/85 (BP Location: Right Arm)   Pulse (!) 57   Temp 97.7 F (36.5 C) (Oral)   Resp 11   Ht '5\' 6"'  (1.676 m)   Wt 54.4 kg   SpO2 99%   BMI 19.37 kg/m  Physical Exam Vitals and nursing note reviewed.  Constitutional:      General: He is not in acute distress.    Appearance: He is well-developed. He is  not ill-appearing, toxic-appearing or diaphoretic.  HENT:     Head: Normocephalic and atraumatic.     Nose: No congestion or rhinorrhea.     Mouth/Throat:     Mouth: Mucous membranes are dry.  Eyes:     Extraocular Movements: Extraocular movements intact.     Conjunctiva/sclera: Conjunctivae normal.     Pupils: Pupils are equal, round, and reactive to light.  Cardiovascular:     Rate and Rhythm: Normal rate and regular rhythm.     Heart sounds: No murmur heard. Pulmonary:     Effort: Pulmonary effort is normal. No respiratory distress.     Breath sounds: Normal breath sounds. No wheezing, rhonchi or rales.  Chest:     Chest wall: No tenderness.  Abdominal:     General: Abdomen is flat.     Palpations: Abdomen is soft.     Tenderness: There is no abdominal tenderness. There is no right CVA tenderness, left CVA tenderness, guarding or rebound.  Musculoskeletal:        General: No swelling or tenderness.     Cervical back: Neck supple.  Skin:    General: Skin is warm and dry.     Capillary Refill: Capillary refill takes less than 2 seconds.     Findings: No erythema.  Neurological:     General: No focal deficit present.     Mental Status: He is alert.  Psychiatric:        Mood and Affect: Mood normal.    ED Results / Procedures / Treatments   Labs (all labs ordered are listed, but only abnormal results are displayed) Labs Reviewed  URINALYSIS, ROUTINE W REFLEX MICROSCOPIC - Abnormal; Notable for the following components:      Result Value   APPearance HAZY (*)    All other components within normal limits  CBC WITH DIFFERENTIAL/PLATELET - Abnormal; Notable for the following components:   WBC 3.3 (*)    RBC 3.32 (*)    Hemoglobin 11.4 (*)    HCT 32.1 (*)    MCH 34.3 (*)    Lymphs Abs 0.6 (*)    All other components within normal limits  BASIC METABOLIC PANEL - Abnormal; Notable for the following components:   Sodium 126 (*)    Chloride 93 (*)    Glucose, Bld 66 (*)     Creatinine, Ser 0.42 (*)    Calcium 8.2 (*)    All other components within normal limits  TSH  AMMONIA  HEPATIC FUNCTION PANEL    EKG None  Radiology No results found.  Procedures Procedures    Medications Ordered in ED Medications  sodium chloride 0.9 % bolus 1,000 mL (1,000 mLs Intravenous New Bag/Given 11/06/21 2038)    ED Course/ Medical Decision Making/ A&P  Medical Decision Making Amount and/or Complexity of Data Reviewed Labs: ordered.  Risk Decision regarding hospitalization.    Alexander Duran is a 76 y.o. 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  According to patient, he is scheduled to have a PET scan soon to monitor his cancer.  He said that he has felt more fatigued recently and went to get blood work drawn today.  They called him saying that his sodium was 124 and think that he is having symptomatic hyponatremia causing his fatigue.  Of note, patient had just felt so tired putting an iced coffee into his G-tube to "perk him up".  Otherwise he denies any headache, neck pain, chest pain, abdominal pain.  Denies nausea or vomiting and had recent diarrhea but not over the last week.  Denies any other physical complaints aside from the fatigue overall.  On exam, lungs clear and chest nontender.  Abdomen nontender.  Patient moving all extremities.  Patient has surgical scars on his head and neck but otherwise is resting.  He does have dry mucous membranes.  We repeated some lab work showing he does indeed have hyponatremia at 126.  His glucose also was in the 60s, will recheck with a CBG.  He had some other screening lab work in triage including a urinalysis that did not show UTI and a CBC that showed mild anemia  and slightly low white blood cell count.  Creatinine was nonelevated.  Due to his persistent fatigue, we will add on ammonia, TSH with his history of hypothyroidism, and get hepatic function panel as well.  We will give some fluids.  I had a shared decision conversation with the patient offering either discharge or admission given his hyponatremia that was starting to improve however he still reports feeling so fatigued and tired he does not feel safe going home.  We will give him the IV fluids and will call for admission.  Anticipate admission for further management of symptomatic hyponatremia and severe fatigue.  Patient will be admitted for further management.        Final Clinical Impression(s) / ED Diagnoses Final diagnoses:  Hyponatremia  Fatigue, unspecified type     Clinical Impression: 1. Hyponatremia   2. Fatigue, unspecified type     Disposition: Admit  This note was prepared with assistance of Dragon voice recognition software. Occasional wrong-word or sound-a-like substitutions may have occurred due to the inherent limitations of voice recognition software.      Jaclin Finks, Gwenyth Allegra, MD 11/06/21 2124

## 2021-11-06 NOTE — ED Triage Notes (Signed)
Pt arrived via EMS from home, went to pcp today had blood work drawn, was told to come in for sodium of 124.

## 2021-11-07 DIAGNOSIS — E871 Hypo-osmolality and hyponatremia: Principal | ICD-10-CM

## 2021-11-07 DIAGNOSIS — E039 Hypothyroidism, unspecified: Secondary | ICD-10-CM

## 2021-11-07 DIAGNOSIS — Z825 Family history of asthma and other chronic lower respiratory diseases: Secondary | ICD-10-CM | POA: Diagnosis not present

## 2021-11-07 DIAGNOSIS — D72819 Decreased white blood cell count, unspecified: Secondary | ICD-10-CM

## 2021-11-07 DIAGNOSIS — E162 Hypoglycemia, unspecified: Secondary | ICD-10-CM | POA: Diagnosis present

## 2021-11-07 DIAGNOSIS — Z79890 Hormone replacement therapy (postmenopausal): Secondary | ICD-10-CM | POA: Diagnosis not present

## 2021-11-07 DIAGNOSIS — Z923 Personal history of irradiation: Secondary | ICD-10-CM | POA: Diagnosis not present

## 2021-11-07 DIAGNOSIS — Z66 Do not resuscitate: Secondary | ICD-10-CM | POA: Diagnosis present

## 2021-11-07 DIAGNOSIS — Z931 Gastrostomy status: Secondary | ICD-10-CM

## 2021-11-07 DIAGNOSIS — E878 Other disorders of electrolyte and fluid balance, not elsewhere classified: Secondary | ICD-10-CM | POA: Diagnosis present

## 2021-11-07 DIAGNOSIS — Z85818 Personal history of malignant neoplasm of other sites of lip, oral cavity, and pharynx: Secondary | ICD-10-CM | POA: Diagnosis not present

## 2021-11-07 DIAGNOSIS — Z801 Family history of malignant neoplasm of trachea, bronchus and lung: Secondary | ICD-10-CM | POA: Diagnosis not present

## 2021-11-07 DIAGNOSIS — Z79899 Other long term (current) drug therapy: Secondary | ICD-10-CM | POA: Diagnosis not present

## 2021-11-07 DIAGNOSIS — E43 Unspecified severe protein-calorie malnutrition: Secondary | ICD-10-CM

## 2021-11-07 DIAGNOSIS — Z7982 Long term (current) use of aspirin: Secondary | ICD-10-CM | POA: Diagnosis not present

## 2021-11-07 DIAGNOSIS — I10 Essential (primary) hypertension: Secondary | ICD-10-CM | POA: Diagnosis present

## 2021-11-07 DIAGNOSIS — Z87891 Personal history of nicotine dependence: Secondary | ICD-10-CM | POA: Diagnosis not present

## 2021-11-07 LAB — BASIC METABOLIC PANEL
Anion gap: 8 (ref 5–15)
Anion gap: 7 (ref 5–15)
Anion gap: 8 (ref 5–15)
BUN: 15 mg/dL (ref 8–23)
BUN: 13 mg/dL (ref 8–23)
BUN: 13 mg/dL (ref 8–23)
CO2: 26 mmol/L (ref 22–32)
CO2: 26 mmol/L (ref 22–32)
CO2: 28 mmol/L (ref 22–32)
Calcium: 8.7 mg/dL — ABNORMAL LOW (ref 8.9–10.3)
Calcium: 8.4 mg/dL — ABNORMAL LOW (ref 8.9–10.3)
Calcium: 8.8 mg/dL — ABNORMAL LOW (ref 8.9–10.3)
Chloride: 96 mmol/L — ABNORMAL LOW (ref 98–111)
Chloride: 98 mmol/L (ref 98–111)
Chloride: 95 mmol/L — ABNORMAL LOW (ref 98–111)
Creatinine, Ser: 0.49 mg/dL — ABNORMAL LOW (ref 0.61–1.24)
Creatinine, Ser: 0.45 mg/dL — ABNORMAL LOW (ref 0.61–1.24)
Creatinine, Ser: 0.43 mg/dL — ABNORMAL LOW (ref 0.61–1.24)
GFR, Estimated: >60 mL/min (ref >60)
GFR, Estimated: >60 mL/min (ref >60)
GFR, Estimated: >60 mL/min (ref >60)
Glucose, Bld: 135 mg/dL — ABNORMAL HIGH (ref 70–99)
Glucose, Bld: 87 mg/dL (ref 70–99)
Glucose, Bld: 90 mg/dL (ref 70–99)
Potassium: 3.9 mmol/L (ref 3.5–5.1)
Potassium: 4.2 mmol/L (ref 3.5–5.1)
Potassium: 4.2 mmol/L (ref 3.5–5.1)
Sodium: 130 mmol/L — ABNORMAL LOW (ref 135–145)
Sodium: 131 mmol/L — ABNORMAL LOW (ref 135–145)
Sodium: 131 mmol/L — ABNORMAL LOW (ref 135–145)

## 2021-11-07 LAB — GLUCOSE, CAPILLARY
Glucose-Capillary: 101 mg/dL — ABNORMAL HIGH (ref 70–99)
Glucose-Capillary: 129 mg/dL — ABNORMAL HIGH (ref 70–99)
Glucose-Capillary: 141 mg/dL — ABNORMAL HIGH (ref 70–99)
Glucose-Capillary: 165 mg/dL — ABNORMAL HIGH (ref 70–99)
Glucose-Capillary: 69 mg/dL — ABNORMAL LOW (ref 70–99)
Glucose-Capillary: 69 mg/dL — ABNORMAL LOW (ref 70–99)
Glucose-Capillary: 83 mg/dL (ref 70–99)
Glucose-Capillary: 86 mg/dL (ref 70–99)
Glucose-Capillary: 91 mg/dL (ref 70–99)

## 2021-11-07 LAB — CBC WITH DIFFERENTIAL/PLATELET
Abs Immature Granulocytes: 0 10*3/uL (ref 0.00–0.07)
Basophils Absolute: 0 10*3/uL (ref 0.0–0.1)
Basophils Relative: 2 %
Eosinophils Absolute: 0.1 10*3/uL (ref 0.0–0.5)
Eosinophils Relative: 5 %
HCT: 36 % — ABNORMAL LOW (ref 39.0–52.0)
Hemoglobin: 12.4 g/dL — ABNORMAL LOW (ref 13.0–17.0)
Immature Granulocytes: 0 %
Lymphocytes Relative: 20 %
Lymphs Abs: 0.5 10*3/uL — ABNORMAL LOW (ref 0.7–4.0)
MCH: 33.5 pg (ref 26.0–34.0)
MCHC: 34.4 g/dL (ref 30.0–36.0)
MCV: 97.3 fL (ref 80.0–100.0)
Monocytes Absolute: 0.3 10*3/uL (ref 0.1–1.0)
Monocytes Relative: 13 %
Neutro Abs: 1.5 10*3/uL — ABNORMAL LOW (ref 1.7–7.7)
Neutrophils Relative %: 60 %
Platelets: 235 10*3/uL (ref 150–400)
RBC: 3.7 MIL/uL — ABNORMAL LOW (ref 4.22–5.81)
RDW: 12.3 % (ref 11.5–15.5)
WBC: 2.4 10*3/uL — ABNORMAL LOW (ref 4.0–10.5)
nRBC: 0 % (ref 0.0–0.2)

## 2021-11-07 LAB — T4, FREE: Free T4: 1.14 ng/dL — ABNORMAL HIGH (ref 0.61–1.12)

## 2021-11-07 LAB — HEMOGLOBIN A1C
Hgb A1c MFr Bld: 5.2 % (ref 4.8–5.6)
Mean Plasma Glucose: 102.54 mg/dL

## 2021-11-07 MED ORDER — DEXTROSE 50 % IV SOLN
12.5000 g | Freq: Once | INTRAVENOUS | Status: AC
  Administered 2021-11-07: 12.5 g via INTRAVENOUS
  Filled 2021-11-07: qty 50

## 2021-11-07 MED ORDER — SODIUM CHLORIDE 0.9 % IV SOLN: INTRAVENOUS | Status: AC

## 2021-11-07 MED ORDER — KATE FARMS STANDARD 1.4 PO LIQD
325.0000 mL | Freq: Four times a day (QID) | ORAL | Status: DC
  Administered 2021-11-07 – 2021-11-08 (×3): 325 mL
  Filled 2021-11-07 (×7): qty 325

## 2021-11-07 MED ORDER — FREE WATER
60.0000 mL | Freq: Four times a day (QID) | Status: DC
  Administered 2021-11-07 – 2021-11-08 (×3): 60 mL

## 2021-11-07 MED ORDER — PROSOURCE TF PO LIQD
45.0000 mL | Freq: Every day | ORAL | Status: DC
  Administered 2021-11-07: 45 mL
  Filled 2021-11-07 (×2): qty 45

## 2021-11-07 MED ORDER — DEXTROSE 50 % IV SOLN
INTRAVENOUS | Status: AC
  Administered 2021-11-07: 12.5 g via INTRAVENOUS
  Filled 2021-11-07: qty 50

## 2021-11-07 MED ORDER — OSMOLITE 1.2 CAL PO LIQD: 1000.0000 mL | ORAL | Status: DC

## 2021-11-07 MED ORDER — DEXTROSE-NACL 5-0.9 % IV SOLN: INTRAVENOUS | Status: DC

## 2021-11-07 MED ORDER — DEXTROSE 50 % IV SOLN: 12.5000 g | Freq: Once | INTRAVENOUS | Status: AC

## 2021-11-07 MED ORDER — FREE WATER: 120.0000 mL | Freq: Four times a day (QID) | Status: DC

## 2021-11-07 MED ORDER — ORAL CARE MOUTH RINSE
15.0000 mL | Freq: Two times a day (BID) | OROMUCOSAL | Status: DC
  Administered 2021-11-07 (×2): 15 mL via OROMUCOSAL

## 2021-11-07 MED ORDER — CHLORHEXIDINE GLUCONATE 0.12 % MT SOLN
15.0000 mL | Freq: Two times a day (BID) | OROMUCOSAL | Status: DC
  Administered 2021-11-07 (×3): 15 mL via OROMUCOSAL
  Filled 2021-11-07 (×2): qty 15

## 2021-11-07 NOTE — Assessment & Plan Note (Signed)
--   Slowly improved after receiving normal saline and decreasing free water intake.  No apparent confusion.  Assess for stability, likely sodium stable tomorrow, discharge home.

## 2021-11-07 NOTE — Assessment & Plan Note (Signed)
--   PEG tube dependent

## 2021-11-07 NOTE — Assessment & Plan Note (Signed)
--  resume TF

## 2021-11-07 NOTE — Progress Notes (Signed)
  Progress Note   Patient: Alexander Duran YNW:[id number] DOB: 22-Mar-1946 DOA: 11/06/2021     0 DOS: the patient was seen and examined on 11/07/2021   Brief hospital course: 76 year old man PMH including tonsillar cancer s/p resection 2003, n.p.o., G-tube dependent presented with hyponatremia.  Assessment and Plan: * Hyponatremia -- Slowly improved after receiving normal saline and decreasing free water intake.  No apparent confusion.  Assess for stability, likely sodium stable tomorrow, discharge home.  Leukopenia --stable since February, follow-up as an outpatient.  Protein-calorie malnutrition, severe --1 carton (325 ml) [employer] 1.4 QID with 45 ml Prosource TF (or equivalent) once/day and 60 ml free water before and 60 ml free water after each TF bolus.   Hypothyroidism -- T4 mildly elevated, TSH 9.177.  No changes to current levothyroxine dosing.  Follow-up as an outpatient.  Tonsil cancer ([place]) -- PEG tube dependent  S/P percutaneous endoscopic gastrostomy (PEG) tube placement [facility]) --resume TF   Subjective:  Feels fine  Physical Exam: Vitals:   11/07/21 0228 11/07/21 0453 11/07/21 0600 11/07/21 1113  BP: 130/79 (!) 90/54 (!) 147/83 119/77  Pulse: (!) 52 (!) 48 (!) 48 (!) 56  Resp: '20 18 14 18  '$ Temp: 97.7 F (36.5 C) (!) 97.5 F (36.4 C) 98 F (36.7 C) 97.6 F (36.4 C)  TempSrc:   Oral   SpO2: 94% 98% 100% 100%  Weight:      Height:       Physical Exam Vitals reviewed.  Constitutional:      General: He is not in acute distress.    Appearance: He is not ill-appearing or toxic-appearing.  Cardiovascular:     Rate and Rhythm: Normal rate and regular rhythm.     Heart sounds: No murmur heard. Pulmonary:     Effort: Pulmonary effort is normal. No respiratory distress.     Breath sounds: No wheezing, rhonchi or rales.  Neurological:     Mental Status: He is alert.  Psychiatric:        Mood and Affect: Mood normal.        Behavior: Behavior normal.     Data Reviewed:  Na+ 131, slowly improving  Family Communication: none present or requested  Disposition: Status is: Inpatient Remains inpatient appropriate because: hyponatremia  Planned Discharge Destination: Home    Time spent: 20 minutes  Author: Murray Hodgkins, MD 11/07/2021 6:07 PM  For on call review [email or web address].

## 2021-11-07 NOTE — Progress Notes (Signed)
Hypoglycemic Event  CBG: 69  Treatment: D50 25 mL (12.5 gm)  Symptoms: None  Follow-up CBG: Time: 0059 CBG Result:141  Possible Reasons for Event: Inadequate meal intake    Alexander Duran

## 2021-11-07 NOTE — Progress Notes (Signed)
  Transition of Care [facility]) Screening Note   Patient Details  Name: Alexander Duran Date of Birth: 29-Nov-1945   Transition of Care [facility]) CM/SW Contact:    Dessa Phi, RN Phone Number: 11/07/2021, 3:08 PM    Transition of Care Department [facility]) has reviewed patient and no TOC needs have been identified at this time. We will continue to monitor patient advancement through interdisciplinary progression rounds. If new patient transition needs arise, please place a TOC consult.

## 2021-11-07 NOTE — Progress Notes (Addendum)
Initial Nutrition Assessment  DOCUMENTATION CODES:   Severe malnutrition in context of chronic illness  INTERVENTION:  - will order 1 carton (325 ml) [employer] 1.4 QID with 45 ml Prosource TF (or equivalent) once/day and 30 ml free water before and 30 ml free water after each TF bolus.   - this regimen will provide 1860 kcal, 91 grams protein, and 1176 ml free water.   NUTRITION DIAGNOSIS:   Severe Malnutrition related to chronic illness, cancer and cancer related treatments as evidenced by severe fat depletion, severe muscle depletion.  GOAL:   Patient will meet greater than or equal to 90% of their needs  MONITOR:   TF tolerance, Weight trends, Labs  REASON FOR ASSESSMENT:   Malnutrition Screening Tool, Consult Assessment of nutrition requirement/status, Enteral/tube feeding initiation and management  ASSESSMENT:   76 y.o. 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 He presented to the ED due to hyponatremia noted on routine blood work at the [place]. He was recently told that he could put any fluids that do not clump in the tube, and has been putting juices, ice coffee, and other drinks through the tube. He flushes his G-tube before and after feedings.  Patient sitting up in the chair with no visitors present at the time of RD visit. Patient lives at home with his wife. He has been NPO since admission. He shares that he has been able to have ice chips and sips of water in the past but is unclear on if he is able now.   Patient shares that G-tube was placed on 06/20/21. He uses [employer] 1.4 via bolus feeds at home and administers 3-4 cartons (325 ml each) per day, dependent on timing of he and/or his wife's appointments.   He has not had any tube feeding since noon on 11/06/21.  He works with a Microbiologist at the [employer] and states that he has an appointment with her  in July but that he has her phone number in case he needs anything.   Weight yesterday was 120 lb and weight on 06/27/21 was 130 lb. This indicates 10 lb weight loss (7.7% body weight) in the past 4 months.    Labs reviewed; CBGs: 69-141 mg/dl, Na: 131 mmol/l, creatinine: 0.45 mg/dl, Ca: 8.4 mg/dl. Medications reviewed; 100 mcg per tube/day. IVF; D5-NS @ 30 ml/hr (122 kcal/24 hrs).    NUTRITION - FOCUSED PHYSICAL EXAM:  Flowsheet Row Most Recent Value  Orbital Region Moderate depletion  Upper Arm Region Severe depletion  Buccal Region Severe depletion  Temple Region Moderate depletion  Clavicle Bone Region Severe depletion  Clavicle and Acromion Bone Region Severe depletion  Scapular Bone Region Moderate depletion  Dorsal Hand Mild depletion  Patellar Region Moderate depletion  Anterior Thigh Region Moderate depletion  Posterior Calf Region Mild depletion  Edema (RD Assessment) Mild  [BLE]  Hair Reviewed  Eyes Reviewed  Mouth Reviewed  Skin Reviewed  Nails Reviewed       Diet Order:   Diet Order             Diet NPO time specified  Diet effective now                   EDUCATION NEEDS:   No education needs have been identified at this time  Skin:  Skin Assessment: Reviewed RN Assessment  Last BM:  PTA/unknown  Height:  Ht Readings from Last 1 Encounters:  11/06/21 '5\' 6"'$  (1.676 m)    Weight:   Wt Readings from Last 1 Encounters:  11/06/21 54.5 kg     BMI:  Body mass index is 19.39 kg/m.  Estimated Nutritional Needs:  Kcal:  1700-1900 kcal Protein:  85-95 grams Fluid:  >/= 1.5 L/day     Jarome Matin, MS, RD, LDN Registered Dietitian II Inpatient Clinical Nutrition RD pager # and on-call/weekend pager # available in [facility]

## 2021-11-07 NOTE — Assessment & Plan Note (Addendum)
--  stable since February, follow-up as an outpatient.

## 2021-11-07 NOTE — Progress Notes (Signed)
Hypoglycemic Event  CBG: 69   Treatment: D50 25 mL (12.5 gm)  Symptoms: None  Follow-up CBG: Time:129 CBG Result:0454  Possible Reasons for Event: Inadequate meal intake  Comments/MD notified: Clarene Essex, NP. Maintenance fluids changed.    Alexander Duran

## 2021-11-07 NOTE — Assessment & Plan Note (Signed)
--   T4 mildly elevated, TSH 9.177.  No changes to current levothyroxine dosing.  Follow-up as an outpatient.

## 2021-11-07 NOTE — Progress Notes (Signed)
Called Primary RN Veronica at ext [phone].  Patient had a slight change in MEWS.  Nurse already contacted provider and started IVF.  Discussed  recheck vital signs before shift change and continue to monitor.

## 2021-11-07 NOTE — Hospital Course (Signed)
76 year old man PMH including tonsillar cancer s/p resection 2003, n.p.o., G-tube dependent presented with hyponatremia.

## 2021-11-07 NOTE — Assessment & Plan Note (Signed)
--  1 carton (325 ml) [employer] 1.4 QID with 45 ml Prosource TF (or equivalent) once/day and 60 ml free water before and 60 ml free water after each TF bolus.

## 2021-11-08 DIAGNOSIS — E871 Hypo-osmolality and hyponatremia: Secondary | ICD-10-CM | POA: Diagnosis not present

## 2021-11-08 DIAGNOSIS — Z931 Gastrostomy status: Secondary | ICD-10-CM | POA: Diagnosis not present

## 2021-11-08 DIAGNOSIS — D72819 Decreased white blood cell count, unspecified: Secondary | ICD-10-CM | POA: Diagnosis not present

## 2021-11-08 LAB — BASIC METABOLIC PANEL
Anion gap: 3 — ABNORMAL LOW (ref 5–15)
BUN: 15 mg/dL (ref 8–23)
CO2: 30 mmol/L (ref 22–32)
Calcium: 8.9 mg/dL (ref 8.9–10.3)
Chloride: 99 mmol/L (ref 98–111)
Creatinine, Ser: 0.47 mg/dL — ABNORMAL LOW (ref 0.61–1.24)
GFR, Estimated: >60 mL/min (ref >60)
Glucose, Bld: 87 mg/dL (ref 70–99)
Potassium: 4.7 mmol/L (ref 3.5–5.1)
Sodium: 132 mmol/L — ABNORMAL LOW (ref 135–145)

## 2021-11-08 LAB — GLUCOSE, CAPILLARY
Glucose-Capillary: 83 mg/dL (ref 70–99)
Glucose-Capillary: 93 mg/dL (ref 70–99)
Glucose-Capillary: 119 mg/dL — ABNORMAL HIGH (ref 70–99)

## 2021-11-08 MED ORDER — ESOMEPRAZOLE MAGNESIUM 40 MG PO CPDR: DELAYED_RELEASE_CAPSULE | ORAL | 1 refills | Status: DC

## 2021-11-08 NOTE — Discharge Summary (Signed)
Physician Discharge Summary   Patient: Alexander Duran MRN: [id number] DOB: 02/25/1946  Admit date:     11/06/2021  Discharge date: 11/08/21  Discharge Physician: Murray Hodgkins   PCP: Windy Fast, MD   Recommendations at discharge:   * Hyponatremia --recheck BMP as an outpatient  Leukopenia --stable since February, follow-up as an outpatient.  Hypothyroidism -- T4 mildly elevated, TSH 9.177.  No changes to current levothyroxine dosing.  Follow-up as an outpatient.  Discharge Diagnoses: Principal Problem:   Hyponatremia Active Problems:   Leukopenia   Tonsil cancer (HCC)   Hypothyroidism   Protein-calorie malnutrition, severe   S/P percutaneous endoscopic gastrostomy (PEG) tube placement [facility])  Resolved Problems:   * No resolved hospital problems. *  Hospital Course: 76 year old man PMH including tonsillar cancer s/p resection 2003, n.p.o., G-tube dependent presented with hyponatremia.  Treated with normal saline with gradual improvement in sodium level.  Condition stable, will add some sodium chloride (1/4 tsp BID) to flushes as most likely this represents inadequate solute.  [facility]  * Hyponatremia -- Slowly improved after receiving normal saline.  No apparent confusion.  Up to 132.  Hypochloremia has resolved.  Leukopenia --stable since February, follow-up as an outpatient.  Protein-calorie malnutrition, severe -- continue TF  Hypothyroidism -- T4 mildly elevated, TSH 9.177.  No changes to current levothyroxine dosing.  Follow-up as an outpatient.  Tonsil cancer ([place]) -- PEG tube dependent  S/P percutaneous endoscopic gastrostomy (PEG) tube placement ([place]) --continue TF        Consultants: none Procedures performed: none  Disposition: Home Diet recommendation:  Regular diet DISCHARGE MEDICATION: Allergies as of 11/08/2021       Reactions   Iodinated Contrast Media    Other reaction(s): Weakness present         Medication List     STOP taking these medications    atorvastatin 20 MG tablet Commonly known as: LIPITOR   esomeprazole 40 MG capsule Commonly known as: NexIUM   fludrocortisone 0.1 MG tablet Commonly known as: FLORINEF   gabapentin 250 MG/5ML solution Commonly known as: NEURONTIN   midodrine 10 MG tablet Commonly known as: PROAMATINE   multivitamin with minerals Tabs tablet       TAKE these medications    Accu-Chek Softclix Lancets lancets use as directed up to 4 times daily   albuterol 108 (90 Base) MCG/ACT inhaler Commonly known as: VENTOLIN HFA Inhale 2 puffs into the lungs every 6 (six) hours as needed for wheezing or shortness of breath.   aspirin 81 MG chewable tablet Place 1 tablet (81 mg total) into feeding tube every evening.   Blood Glucose Monitor System w/Device Kit Use as directed up to 4 times daily   free water Soln Give 60 ml water flush before and after each feeding.   glucose blood test strip Use as directed up to 4 times daily   levothyroxine 100 MCG tablet Commonly known as: SYNTHROID Place 1 tablet (100 mcg total) into feeding tube daily before breakfast.   SM Pain Reliever Childrens 160 MG/5ML suspension Generic drug: acetaminophen Place 20.3 mLs (650 mg total) into feeding tube every 8 (eight) hours as needed for mild pain.        Follow-up Information     Windy Fast, MD. Schedule an appointment as soon as possible for a visit in 1 week(s).   Specialty: Internal Medicine Contact information: [phone] [place] [phone]  Feels fine  Discharge Exam: Filed Weights   11/06/21 1750 11/06/21 2201 11/08/21 0500  Weight: 54.4 kg 54.5 kg 51.7 kg   Physical Exam Vitals reviewed.  Constitutional:      General: He is not in acute distress.    Appearance: He is not ill-appearing or toxic-appearing.  Cardiovascular:     Rate and Rhythm: Normal rate and regular rhythm.     Heart  sounds: No murmur heard. Pulmonary:     Effort: Pulmonary effort is normal. No respiratory distress.     Breath sounds: No wheezing, rhonchi or rales.  Neurological:     Mental Status: He is alert and oriented to person, place, and time.  Psychiatric:        Mood and Affect: Mood normal.        Behavior: Behavior normal.     Condition at discharge: good  The results of significant diagnostics from this hospitalization (including imaging, microbiology, ancillary and laboratory) are listed below for reference.   Imaging Studies: No results found.  Microbiology: Results for orders placed or performed during the hospital encounter of 07/12/21  Blood culture (routine x 2)     Status: None   Collection Time: 07/12/21  9:35 PM   Specimen: BLOOD  Result Value Ref Range Status   Specimen Description BLOOD SITE NOT SPECIFIED  Final   Special Requests   Final    BOTTLES DRAWN AEROBIC AND ANAEROBIC Blood Culture adequate volume   Culture   Final    NO GROWTH 5 DAYS Performed at [facility], [address], [place], [place] [address]    Report Status 07/17/2021 FINAL  Final  Resp Panel by RT-PCR (Flu A&B, Covid) Nasopharyngeal Swab     Status: None   Collection Time: 07/12/21  9:37 PM   Specimen: Nasopharyngeal Swab; Nasopharyngeal(NP) swabs in vial transport medium  Result Value Ref Range Status   SARS Coronavirus 2 by RT PCR NEGATIVE NEGATIVE Final    Comment: (NOTE) SARS-CoV-2 target nucleic acids are NOT DETECTED.  The SARS-CoV-2 RNA is generally detectable in upper respiratory specimens during the acute phase of infection. The lowest concentration of SARS-CoV-2 viral copies this assay can detect is 138 copies/mL. A negative result does not preclude SARS-Cov-2 infection and should not be used as the sole basis for treatment or other patient management decisions. A negative result may occur with  improper specimen collection/handling, submission of specimen other than  nasopharyngeal swab, presence of viral mutation(s) within the areas targeted by this assay, and inadequate number of viral copies(<138 copies/mL). A negative result must be combined with clinical observations, patient history, and epidemiological information. The expected result is Negative.  Fact Sheet for Patients:  EntrepreneurPulse.com.au  Fact Sheet for Healthcare Providers:  IncredibleEmployment.be  This test is no t yet approved or cleared by the [place] FDA and  has been authorized for detection and/or diagnosis of SARS-CoV-2 by FDA under an Emergency Use Authorization (EUA). This EUA will remain  in effect (meaning this test can be used) for the duration of the COVID-19 declaration under Section 564(b)(1) of the Act, 21 U.S.C.section 360bbb-3(b)(1), unless the authorization is terminated  or revoked sooner.       Influenza A by PCR NEGATIVE NEGATIVE Final   Influenza B by PCR NEGATIVE NEGATIVE Final    Comment: (NOTE) The Xpert Xpress SARS-CoV-2/FLU/RSV plus assay is intended as an aid in the diagnosis of influenza from Nasopharyngeal swab specimens and should not be used as  a sole basis for treatment. Nasal washings and aspirates are unacceptable for Xpert Xpress SARS-CoV-2/FLU/RSV testing.  Fact Sheet for Patients: EntrepreneurPulse.com.au  Fact Sheet for Healthcare Providers: IncredibleEmployment.be  This test is not yet approved or cleared by the [place] FDA and has been authorized for detection and/or diagnosis of SARS-CoV-2 by FDA under an Emergency Use Authorization (EUA). This EUA will remain in effect (meaning this test can be used) for the duration of the COVID-19 declaration under Section 564(b)(1) of the Act, 21 U.S.C. section 360bbb-3(b)(1), unless the authorization is terminated or revoked.  Performed at [facility], [place] [address] [place], [place] [address]    Blood culture (routine x 2)     Status: None   Collection Time: 07/12/21  9:40 PM   Specimen: BLOOD RIGHT FOREARM  Result Value Ref Range Status   Specimen Description BLOOD RIGHT FOREARM  Final   Special Requests   Final    BOTTLES DRAWN AEROBIC AND ANAEROBIC Blood Culture results may not be optimal due to an inadequate volume of blood received in culture bottles   Culture   Final    NO GROWTH 5 DAYS Performed at [facility], [place] [address] [place], [place] [address]    Report Status 07/17/2021 FINAL  Final  MRSA Next Gen by PCR, Nasal     Status: None   Collection Time: 07/17/21  8:44 AM   Specimen: Nasal Mucosa; Nasal Swab  Result Value Ref Range Status   MRSA by PCR Next Gen NOT DETECTED NOT DETECTED Final    Comment: (NOTE) The GeneXpert MRSA Assay (FDA approved for NASAL specimens only), is one component of a comprehensive MRSA colonization surveillance program. It is not intended to diagnose MRSA infection nor to guide or monitor treatment for MRSA infections. Test performance is not FDA approved in patients less than 52 years old. Performed at [facility], [place] [address] [place], [place] [address]     Labs: CBC: Recent Labs  Lab 11/06/21 1825 11/07/21 0440  WBC 3.3* 2.4*  NEUTROABS 2.2 1.5*  HGB 11.4* 12.4*  HCT 32.1* 36.0*  MCV 96.7 97.3  PLT 227 878   Basic Metabolic Panel: Recent Labs  Lab 11/06/21 1825 11/07/21 0440 11/07/21 1302 11/07/21 1641 11/08/21 0448  NA 126* 130* 131* 131* 132*  K 4.2 3.9 4.2 4.2 4.7  CL 93* 96* 98 95* 99  CO2 '26 26 26 28 30  ' GLUCOSE 66* 135* 87 90 87  BUN '22 15 13 13 15  ' CREATININE 0.42* 0.49* 0.45* 0.43* 0.47*  CALCIUM 8.2* 8.7* 8.4* 8.8* 8.9   Liver Function Tests: Recent Labs  Lab 11/06/21 2213  AST 47*  ALT 20  ALKPHOS 77  BILITOT 0.8  PROT 6.7  ALBUMIN 3.7   CBG: Recent Labs  Lab 11/07/21 2000 11/07/21 2356 11/08/21 0357 11/08/21 0736 11/08/21 1136  GLUCAP 165* 101* 83 93 119*     Discharge time spent: greater than 30 minutes.  Signed: Murray Hodgkins, MD [facility] 11/08/2021

## 2021-11-08 NOTE — Plan of Care (Signed)
PIV removed. DC paperwork reviewed. Pt educated to S&S of hyponatremia   Problem: Education: Goal: Knowledge of General Education information will improve Description: Including pain rating scale, medication(s)/side effects and non-pharmacologic comfort measures Outcome: Completed/Met   Problem: Health Behavior/Discharge Planning: Goal: Ability to manage health-related needs will improve Outcome: Completed/Met   Problem: Clinical Measurements: Goal: Ability to maintain clinical measurements within normal limits will improve Outcome: Completed/Met Goal: Will remain free from infection Outcome: Completed/Met Goal: Diagnostic test results will improve Outcome: Completed/Met Goal: Respiratory complications will improve Outcome: Completed/Met Goal: Cardiovascular complication will be avoided Outcome: Completed/Met   Problem: Activity: Goal: Risk for activity intolerance will decrease Outcome: Completed/Met   Problem: Nutrition: Goal: Adequate nutrition will be maintained Outcome: Completed/Met   Problem: Coping: Goal: Level of anxiety will decrease Outcome: Completed/Met   Problem: Elimination: Goal: Will not experience complications related to bowel motility Outcome: Completed/Met Goal: Will not experience complications related to urinary retention Outcome: Completed/Met   Problem: Pain Managment: Goal: General experience of comfort will improve Outcome: Completed/Met   Problem: Safety: Goal: Ability to remain free from injury will improve Outcome: Completed/Met   Problem: Skin Integrity: Goal: Risk for impaired skin integrity will decrease Outcome: Completed/Met

## 2021-11-08 NOTE — Progress Notes (Signed)
NUTRITION NOTE  Patient sitting up in the chair. He reports no issues or discomfort with his first 3 TF boluses. No questions or concerns at this time.  RD will continue to follow per protocol.     Jarome Matin, MS, RD, LDN Registered Dietitian II Inpatient Clinical Nutrition RD pager # and on-call/weekend pager # available in [facility]

## 2021-11-08 NOTE — TOC Transition Note (Signed)
Transition of Care [facility]) - CM/SW Discharge Note   Patient Details  Name: Alexander Duran MRN: [id number] Date of Birth: 1945-06-23  Transition [facility]) CM/SW Contact:  Dessa Phi, RN Phone Number: 11/08/2021, 12:39 PM   Clinical Narrative:d/c home today. Spoke to spouse-aware of d/c. Has own transport home. No needs or orders.       Final next level of care: Home/Self Care Barriers to Discharge: No Barriers Identified   Patient Goals and CMS Choice Patient states their goals for this hospitalization and ongoing recovery are:: home CMS Medicare.gov Compare Post Acute Care list provided to:: Patient Choice offered to / list presented to : Patient  Discharge Placement                       Discharge Plan and Services   Discharge Planning Services: CM Consult                                 Social Determinants of Health (SDOH) Interventions     Readmission Risk Interventions    07/27/2021    2:30 PM 06/27/2021   12:24 PM  Readmission Risk Prevention Plan  Transportation Screening Complete Complete  PCP or Specialist Appt within 5-7 Days  Complete  PCP or Specialist Appt within 3-5 Days Complete   Home Care Screening  Complete  Medication Review (RN CM)  Complete  HRI or Home Care Consult Complete   Social Work Consult for [place] Planning/Counseling Complete   Palliative Care Screening Complete   Medication Review [employer]) Complete

## 2021-11-26 ENCOUNTER — Other Ambulatory Visit (HOSPITAL_COMMUNITY): Payer: Self-pay | Admitting: Internal Medicine

## 2021-11-26 DIAGNOSIS — C4490 Unspecified malignant neoplasm of skin, unspecified: Secondary | ICD-10-CM

## 2021-11-28 ENCOUNTER — Encounter (HOSPITAL_COMMUNITY): Payer: Self-pay | Admitting: Emergency Medicine

## 2021-11-28 ENCOUNTER — Emergency Department (HOSPITAL_COMMUNITY)
Admission: EM | Admit: 2021-11-28 | Discharge: 2021-11-28 | Disposition: A | Discharge status: Home / Self Care | Payer: No Typology Code available for payment source | Attending: Emergency Medicine | Admitting: Emergency Medicine

## 2021-11-28 ENCOUNTER — Other Ambulatory Visit: Payer: Self-pay

## 2021-11-28 ENCOUNTER — Emergency Department (HOSPITAL_COMMUNITY): Payer: No Typology Code available for payment source

## 2021-11-28 DIAGNOSIS — E039 Hypothyroidism, unspecified: Secondary | ICD-10-CM | POA: Insufficient documentation

## 2021-11-28 DIAGNOSIS — I10 Essential (primary) hypertension: Secondary | ICD-10-CM | POA: Insufficient documentation

## 2021-11-28 DIAGNOSIS — Z7982 Long term (current) use of aspirin: Secondary | ICD-10-CM | POA: Insufficient documentation

## 2021-11-28 DIAGNOSIS — T85528A Displacement of other gastrointestinal prosthetic devices, implants and grafts, initial encounter: Secondary | ICD-10-CM

## 2021-11-28 DIAGNOSIS — K9429 Other complications of gastrostomy: Secondary | ICD-10-CM | POA: Insufficient documentation

## 2021-11-28 DIAGNOSIS — Z79899 Other long term (current) drug therapy: Secondary | ICD-10-CM | POA: Diagnosis not present

## 2021-11-28 DIAGNOSIS — K9423 Gastrostomy malfunction: Secondary | ICD-10-CM | POA: Diagnosis present

## 2021-11-28 IMAGING — DX DG ABDOMEN 1V
1 series · 1 of 1 positions shown · IV contrast (omnipaque)
Comparison: Abdominal radiograph [DATE]

CLINICAL DATA: Check G-tube. 15 mL of Omnipaque 300 was injected
into the G-tube.

EXAM:
ABDOMEN - 1 VIEW

[abdomen kub]
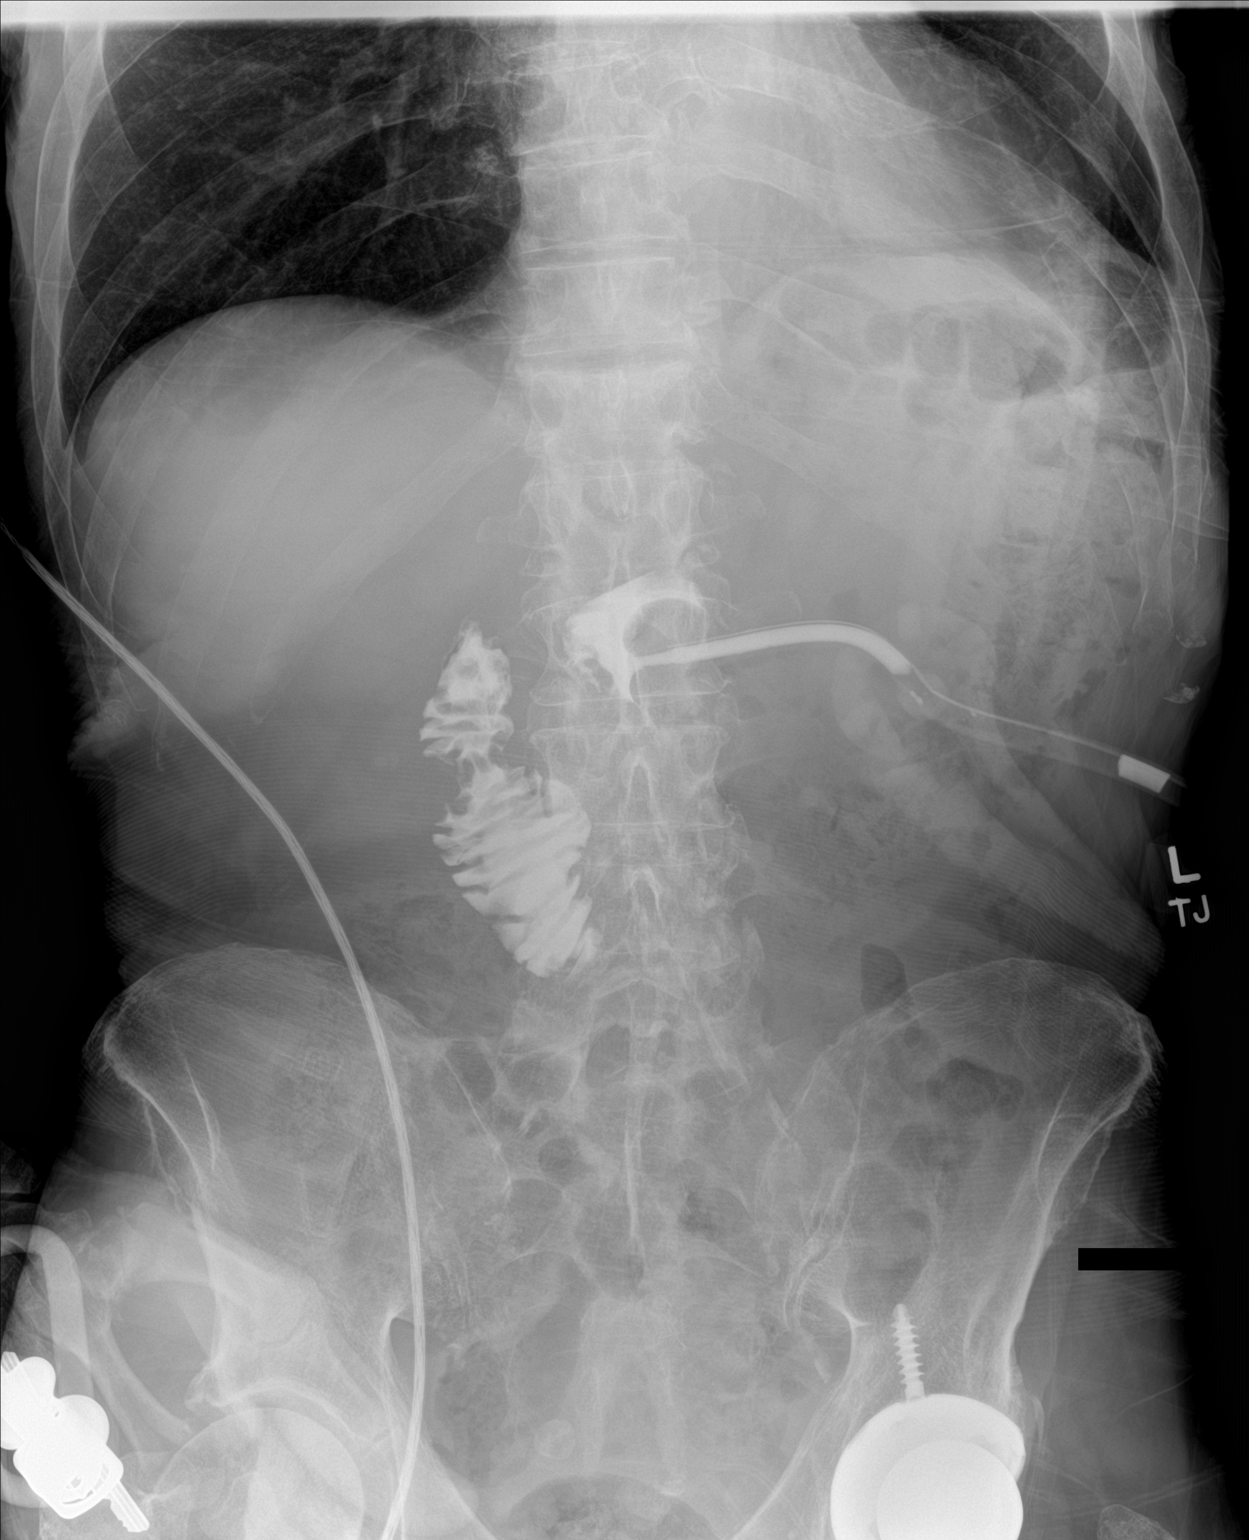

[1 of 1 positions shown; findings below may reference images not displayed]

FINDINGS: Gastrostomy tube appears to be positioned in the distal stomach.
There is opacification of the distal stomach and the duodenum.
Patient has a left hip arthroplasty. Nonobstructive bowel gas
pattern. Chronic volume loss with air bronchograms at the medial
left lung base.
IMPRESSION: Gastrostomy tube appears to be appropriately positioned within the
distal stomach.

## 2021-11-28 MED ORDER — ZINC OXIDE 40 % EX OINT
TOPICAL_OINTMENT | Freq: Once | CUTANEOUS | Status: AC
  Filled 2021-11-28: qty 57

## 2021-11-28 MED ORDER — ZINC OXIDE 40 % EX OINT: 1.0000 "application " | TOPICAL_OINTMENT | CUTANEOUS | 0 refills | Status: AC | PRN

## 2021-11-28 MED ORDER — DIATRIZOATE MEGLUMINE & SODIUM 66-10 % PO SOLN
30.0000 mL | Freq: Once | ORAL | Status: AC
  Administered 2021-11-28: 15 mL

## 2021-11-28 MED ORDER — BACITRACIN-NEOMYCIN-POLYMYXIN OINTMENT TUBE
TOPICAL_OINTMENT | Freq: Once | CUTANEOUS | Status: AC
  Filled 2021-11-28: qty 14.17

## 2021-11-28 MED ORDER — DIATRIZOATE MEGLUMINE & SODIUM 66-10 % PO SOLN: 30.0000 mL | Freq: Once | ORAL | Status: DC

## 2021-11-28 NOTE — ED Provider Notes (Signed)
[facility] DEPT Provider Note   CSN: [id number] Arrival date & time: 11/28/21  0846     History  Chief Complaint  Patient presents with   Feeding Tube came out     Alexander Duran is a 76 y.o. male.  HPI  76 year old male with a history of hypertension, hypothyroidism, weight loss from chronic dysphagia, now G-tube dependent who presents to the emergency department after his G-tube fell out.  The patient presents with an 32 [place] G-tube that apparently fell out this morning.  He has had a place since January.  He has had some problems with erythema and leakage around his G-tube site.  He denies any fevers or chills.  He endorses some erythema around that site with no purulence and no tenderness.  Home Medications Prior to Admission medications   Medication Sig Start Date End Date Taking? Authorizing Provider  liver oil-zinc oxide (DESITIN) 40 % ointment Apply 1 application  topically as needed for up to 10 days for irritation. 11/28/21 12/08/21 Yes Regan Lemming, MD  Accu-Chek Softclix Lancets lancets use as directed up to 4 times daily 07/27/21   Modena Jansky, MD  acetaminophen (TYLENOL) 160 MG/5ML suspension Place 20.3 mLs (650 mg total) into feeding tube every 8 (eight) hours as needed for mild pain. Patient not taking: Reported on 07/14/2021 06/27/21   Jonetta Osgood, MD  albuterol (VENTOLIN HFA) 108 (90 Base) MCG/ACT inhaler Inhale 2 puffs into the lungs every 6 (six) hours as needed for wheezing or shortness of breath.    [provider]  aspirin 81 MG chewable tablet Place 1 tablet (81 mg total) into feeding tube every evening. 07/27/21   Hongalgi, Lenis Dickinson, MD  Blood Glucose Monitoring Suppl (BLOOD GLUCOSE MONITOR SYSTEM) w/Device KIT Use as directed up to 4 times daily 07/27/21   Vernell Leep D, MD  glucose blood test strip Use as directed up to 4 times daily 07/27/21   Modena Jansky, MD  levothyroxine (SYNTHROID) 100 MCG  tablet Place 1 tablet (100 mcg total) into feeding tube daily before breakfast. 07/27/21   Hongalgi, Lenis Dickinson, MD  Water For Irrigation, Sterile (FREE WATER) SOLN Give 60 ml water flush before and after each feeding. 07/27/21   Hongalgi, Lenis Dickinson, MD      Allergies    Iodinated contrast media    Review of Systems   Review of Systems  All other systems reviewed and are negative.   Physical Exam Updated Vital Signs BP 140/70   Pulse (!) 56   Temp 98 F (36.7 C) (Oral)   Resp 18   SpO2 100%  Physical Exam Vitals and nursing note reviewed.  Constitutional:      General: He is not in acute distress.    Appearance: He is well-developed.  HENT:     Head: Normocephalic and atraumatic.  Eyes:     Conjunctiva/sclera: Conjunctivae normal.  Cardiovascular:     Rate and Rhythm: Normal rate and regular rhythm.  Pulmonary:     Effort: Pulmonary effort is normal. No respiratory distress.     Breath sounds: Normal breath sounds.  Abdominal:     Palpations: Abdomen is soft.     Tenderness: There is no abdominal tenderness.     Comments: G-tube site nontender, surrounding erythema appears to be irritation, serous fluid leaking from the site itself  Musculoskeletal:        General: No swelling.     Cervical back: Neck supple.  Skin:    General: Skin is warm and dry.     Capillary Refill: Capillary refill takes less than 2 seconds.  Neurological:     Mental Status: He is alert.  Psychiatric:        Mood and Affect: Mood normal.     ED Results / Procedures / Treatments   Labs (all labs ordered are listed, but only abnormal results are displayed) Labs Reviewed - No data to display  EKG None  Radiology DG ABDOMEN PEG TUBE LOCATION  Result Date: 11/28/2021 CLINICAL DATA:  Check G-tube. 15 mL of Omnipaque 300 was injected into the G-tube. EXAM: ABDOMEN - 1 VIEW COMPARISON:  Abdominal radiograph 07/24/2021 FINDINGS: Gastrostomy tube appears to be positioned in the distal stomach. There  is opacification of the distal stomach and the duodenum. Patient has a left hip arthroplasty. Nonobstructive bowel gas pattern. Chronic volume loss with air bronchograms at the medial left lung base. IMPRESSION: Gastrostomy tube appears to be appropriately positioned within the distal stomach. Electronically Signed   By: Markus Daft M.D.   On: 11/28/2021 13:15    Procedures Gastrostomy tube replacement  Date/Time: 11/28/2021 12:12 PM  Performed by: Regan Lemming, MD Authorized by: Regan Lemming, MD  Consent: Verbal consent obtained. Consent given by: patient Required items: required blood products, implants, devices, and special equipment available Time out: Immediately prior to procedure a "time out" was called to verify the correct patient, procedure, equipment, support staff and site/side marked as required. Preparation: Patient was prepped and draped in the usual sterile fashion. Patient tolerance: patient tolerated the procedure well with no immediate complications Comments: 43 [place] G-tube inserted without complication, balloon inflated with 10 cc normal saline       Medications Ordered in ED Medications  liver oil-zinc oxide (DESITIN) 40 % ointment (has no administration in time range)  neomycin-bacitracin-polymyxin (NEOSPORIN) ointment (has no administration in time range)  diatrizoate meglumine-sodium (GASTROGRAFIN) 66-10 % solution 30 mL (15 mLs Per Tube Given 11/28/21 1309)    ED Course/ Medical Decision Making/ A&P                           Medical Decision Making Amount and/or Complexity of Data Reviewed Radiology: ordered.  Risk OTC drugs. Prescription drug management.   76 year old male with a history of hypertension, hypothyroidism, weight loss from chronic dysphagia, now G-tube dependent who presents to the emergency department after his G-tube fell out.  The patient presents with an 16 [place] G-tube that apparently fell out this morning.  He has had a place  since January.  He has had some problems with erythema and leakage around his G-tube site.  He denies any fevers or chills.  He endorses some erythema around that site with no purulence and no tenderness.  On arrival, the patient was vitally stable.  Presenting after his gastrostomy tube fell out earlier this morning.  11 French gastrostomy tube bedside with the patient.  He has had significant leakage from around the site and presents with some irritation surrounding the site.  Lower concern for cellulitis around that area as it is minimally tender.  Given the leakage, the patient's G-tube was upsized and a 20 [place] G-tube was successfully placed and confirmed by placement on x-ray of the abdomen.  Telephone note with his general surgery office recommends Desitin cream to apply daily for irritation surrounding the G-tube site.  The patient was well-appearing status post G-tube placement.  Desitin  cream was ordered for irritation surrounding his G-tube site.  Stable for discharge.  Final Clinical Impression(s) / ED Diagnoses Final diagnoses:  Dislodged gastrostomy tube  Irritation around percutaneous endoscopic gastrostomy (PEG) tube site [facility]    Rx / DC Orders ED Discharge Orders          Ordered    liver oil-zinc oxide (DESITIN) 40 % ointment  As needed        11/28/21 1217              Regan Lemming, MD 11/28/21 1331

## 2021-11-28 NOTE — ED Triage Notes (Signed)
Pt reports feeding tube fell out this morning.

## 2021-12-03 ENCOUNTER — Encounter (HOSPITAL_COMMUNITY)
Admission: RE | Admit: 2021-12-03 | Discharge: 2021-12-03 | Disposition: A | Discharge status: Home / Self Care | Payer: No Typology Code available for payment source | Source: Ambulatory Visit | Attending: Internal Medicine | Admitting: Internal Medicine

## 2021-12-03 DIAGNOSIS — C4490 Unspecified malignant neoplasm of skin, unspecified: Secondary | ICD-10-CM | POA: Insufficient documentation

## 2021-12-03 LAB — GLUCOSE, CAPILLARY: Glucose-Capillary: 82 mg/dL (ref 70–99)

## 2021-12-03 IMAGING — CT NM PET TUM IMG INITIAL (PI) WHOLE BODY
8 series · 23 of 25 positions shown · non-contrast
Comparison: CT chest [DATE].

CLINICAL DATA: Initial treatment strategy for squamous cell
carcinoma of the neck and scalp.

EXAM:
NUCLEAR MEDICINE PET WHOLE BODY
TECHNIQUE: 5.6 mCi F-18 FDG was injected intravenously. Full-ring PET imaging
was performed from the head to foot after the radiotracer. CT data
was obtained and used for attenuation correction and anatomic
localization.
Fasting blood glucose: 82 mg/dl

[Series 3: pet wb ac · axial · 5.0mm · 4.07mm/px · z∈[-167,+1513]mm · 5 of 421 slices shown]
[im 1/421]
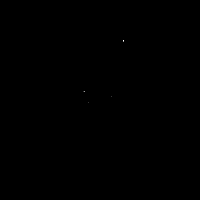
[im 106/421]
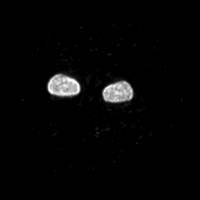
[im 211/421]
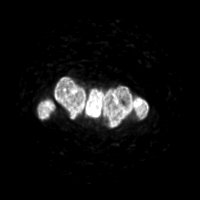
[im 316/421]
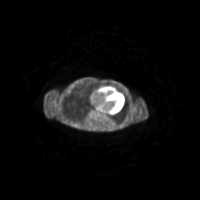
[im 421/421]
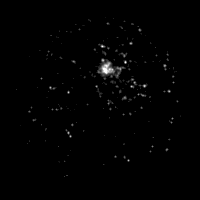

[Series 4: ct wb 5.0 br38 · axial · 5.0mm · 0.98mm/px · z∈[-167,+1513]mm · 4 of 419 slices shown]
[im 1/419]
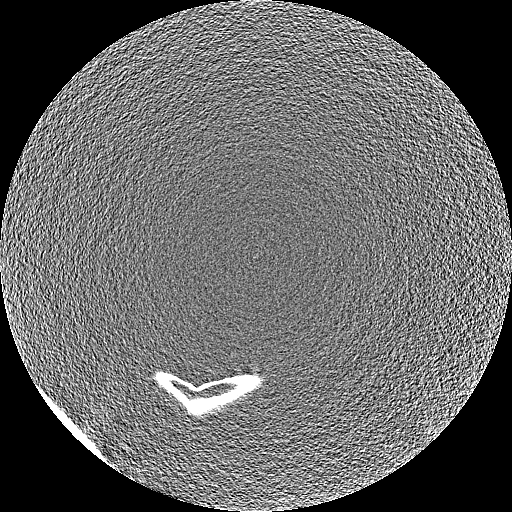
[im 210/419  soft-tissue]
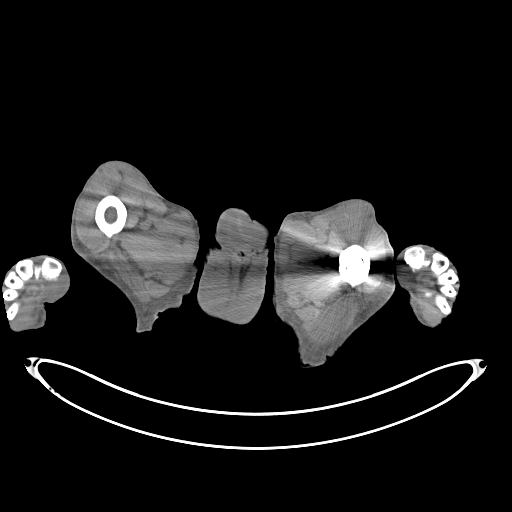
[im 314/419  soft-tissue]
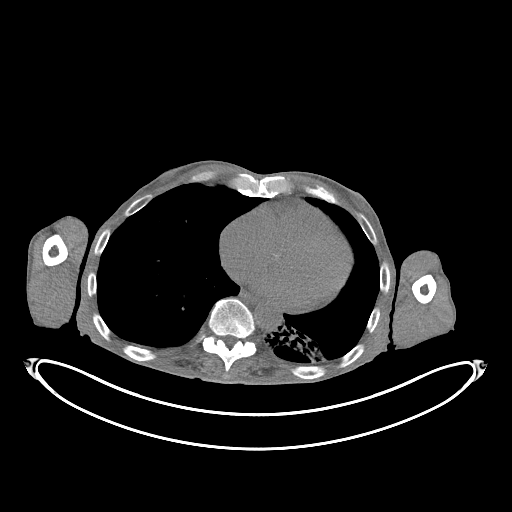
[im 419/419  soft-tissue]
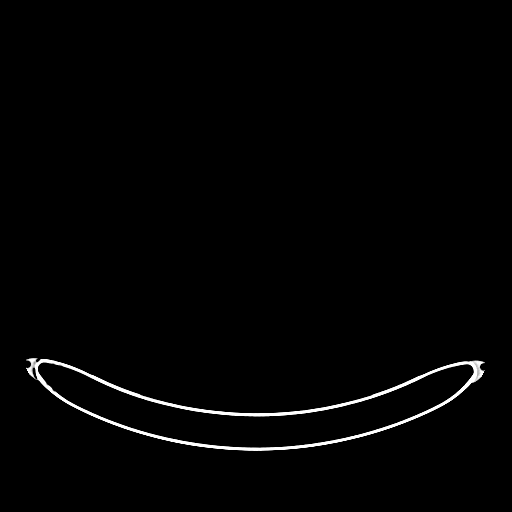

[Series 5: pet wb nac · axial · 5.0mm · 4.07mm/px · z∈[-167,+1513]mm · 6 of 421 slices shown]
[im 1/421]
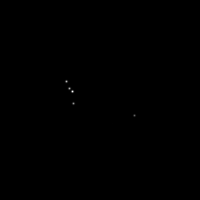
[im 85/421]
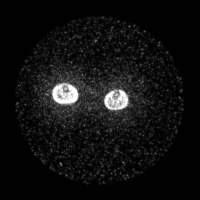
[im 169/421]
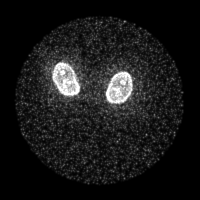
[im 253/421]
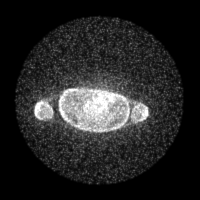
[im 337/421]
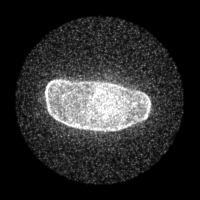
[im 421/421]
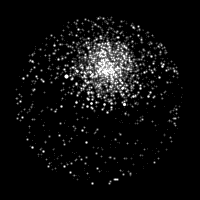

[Series 7: ct 5.0 bl57 lung_bone · axial · 5.0mm · 0.72mm/px · 1 of 83 slices shown]
[im 1/83  lung]
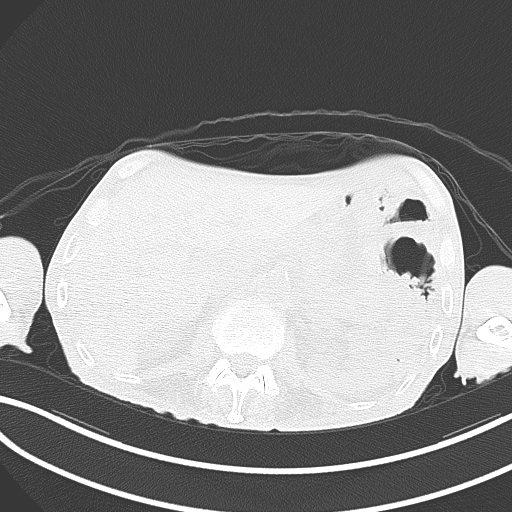

[Series 604: fused tra · 4 of 386 slices shown]
[im 1/386]
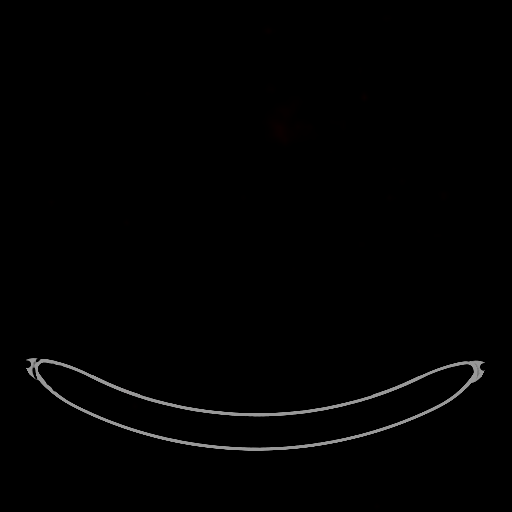
[im 193/386]
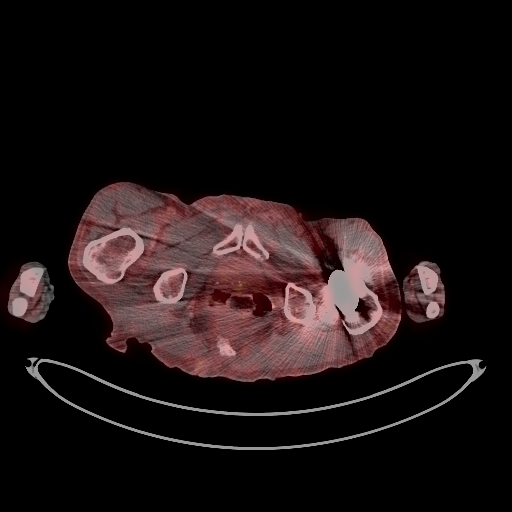
[im 289/386]
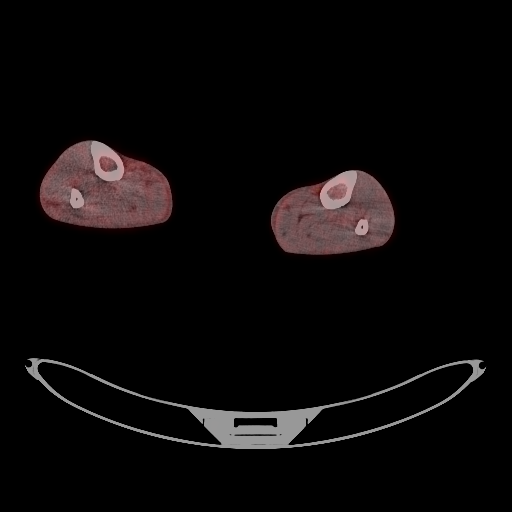
[im 386/386]
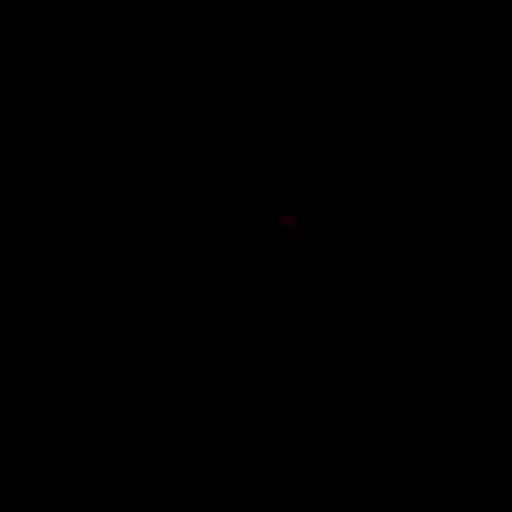

[Series 605: fused cor · 1 of 44 slices shown]
[im 1/44]
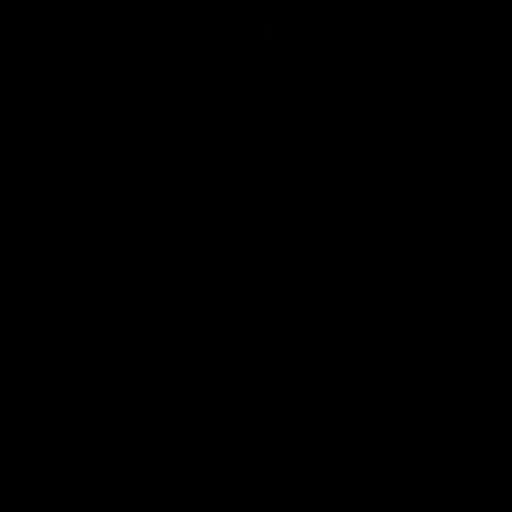

[Series 606: mip pet · coronal · 3.48mm/px · 1 of 32 slices shown]
[im 1/32]
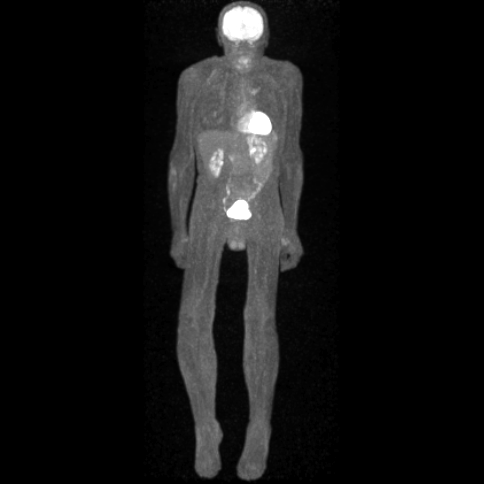

[Series 1120: results mm oncology reading · 1.0mm · 0.50mm/px · 1 of 4 slices shown]
[im 1/4]
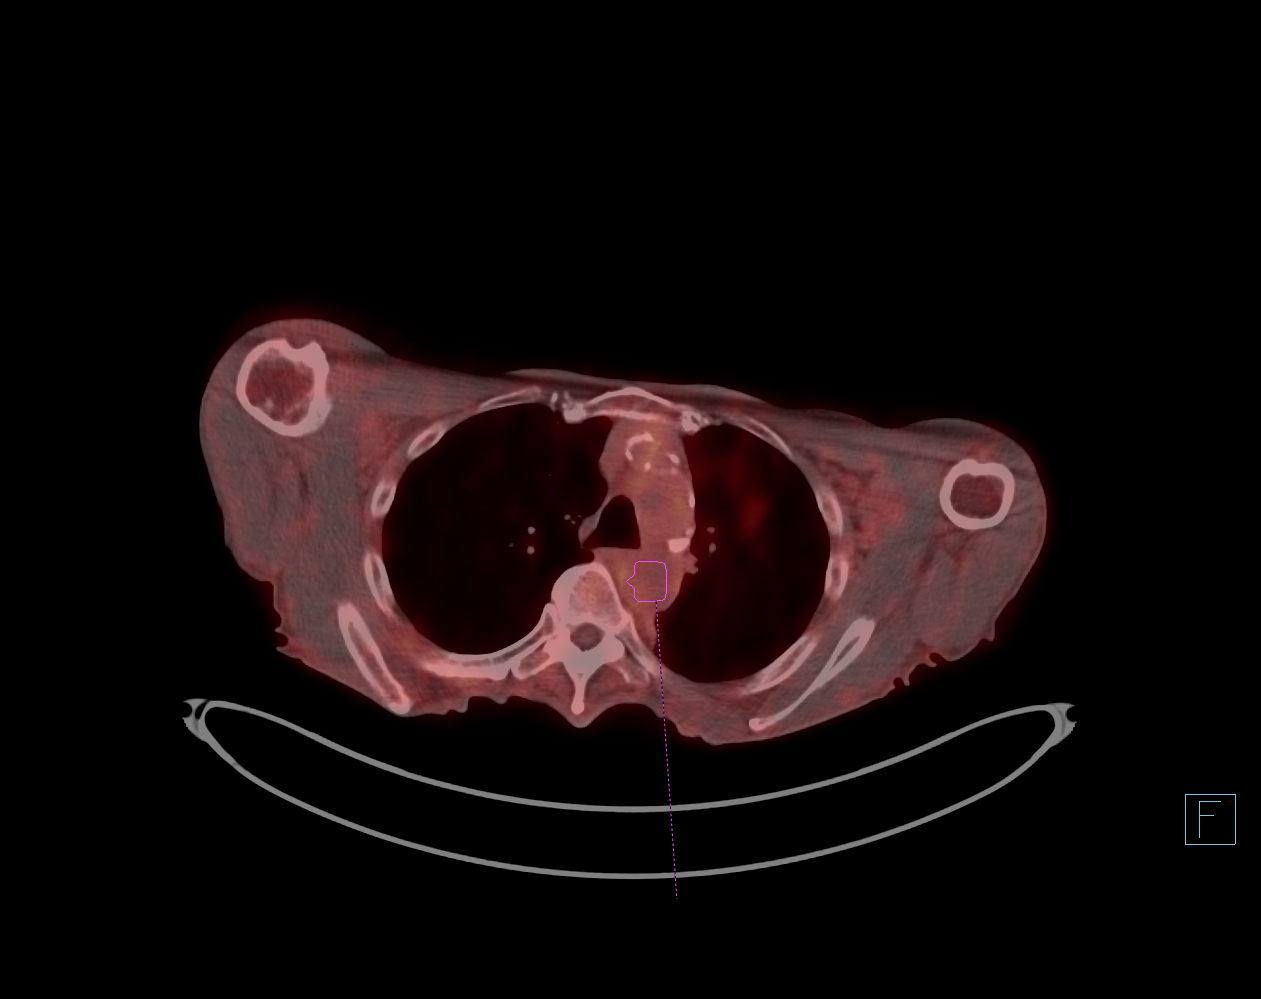

[23 of 25 positions shown; findings below may reference images not displayed]

FINDINGS: Mediastinal blood pool activity: SUV max

HEAD/NECK: No hypermetabolic activity in the scalp. No
hypermetabolic cervical lymph nodes.

Incidental CT findings: none

CHEST: No tracer avid axillary supraclavicular, mediastinal, or
hilar lymph nodes.

Chronic atelectasis and volume loss involving the left lower lobe
containing diffuse bronchograms identified, image 56/7.

Bilateral patchy areas of upper lung zone predominant ground-glass
attenuation identified with corresponding increased tracer uptake on
the PET images. For example, regional area of ground-glass
attenuation within the anteromedial left upper lobe measures
approximately 2.8 cm and has an SUV max 2.82, image 33/7. Similarly,
there is a smaller area of ground-glass attenuation within the
anterior right upper lobe measuring 1.4 cm with SUV max of 1.39,
image [DATE]. Within the right middle lobe there is a area of
ground-glass attenuation measuring 2.2 cm with SUV max 1.9, image
58/7. The appearance is nonspecific but favored to represent
sequelae of recent inflammation or infection. No suspicious tracer
avid solid lung nodule or mass identified.

Incidental CT findings: Aortic atherosclerosis and coronary artery
calcifications.

ABDOMEN/PELVIS: No abnormal tracer uptake identified within the
liver, pancreas, spleen or adrenal glands. No tracer avid
abdominopelvic lymph nodes identified.

Incidental CT findings: Aortic atherosclerosis without aneurysm.
Gastrostomy tube is identified. No signs tracer avid bone
metastases. Multiple small calcifications are noted layering within
the dependent portion of the bladder measuring up to 3 mm.

SKELETON: No focal hypermetabolic activity to suggest skeletal
metastasis.

Incidental CT findings: Chronic appearing compression deformity is
identified T12 level with loss of approximately 30% of the superior
endplate. Status post left hip arthroplasty.

EXTREMITIES: No abnormal hypermetabolic activity in the lower
extremities.

Incidental CT findings: none
IMPRESSION: 1. No specific findings identified to suggest residual or recurrent
tumor or metastatic disease.
2. Bilateral patchy areas of ground-glass attenuation are noted
within both lungs with corresponding mild increased tracer uptake.
This finding is favored to represent sequelae of inflammatory or
infectious process. Recommend follow-up imaging with repeat CT of
the chest in 3 months to ensure resolution.
3. Chronic appearing atelectasis/volume loss involving the left
lower lobe containing diffuse bronchograms. This is likely the
sequelae of previous inflammation or infection. Attention on
follow-up imaging advised.
4.  Aortic Atherosclerosis ([00]-[00]).
5. Small bladder calcifications suspected.

## 2021-12-03 MED ORDER — FLUDEOXYGLUCOSE F - 18 (FDG) INJECTION
5.3000 | Freq: Once | INTRAVENOUS | Status: AC
  Administered 2021-12-03: 5.6 via INTRAVENOUS

## 2022-06-16 ENCOUNTER — Emergency Department (HOSPITAL_COMMUNITY): Payer: No Typology Code available for payment source

## 2022-06-16 ENCOUNTER — Encounter (HOSPITAL_COMMUNITY): Payer: Self-pay

## 2022-06-16 ENCOUNTER — Emergency Department (HOSPITAL_COMMUNITY)
Admission: EM | Admit: 2022-06-16 | Discharge: 2022-06-16 | Disposition: A | Discharge status: Home / Self Care | Payer: No Typology Code available for payment source | Attending: Emergency Medicine | Admitting: Emergency Medicine

## 2022-06-16 DIAGNOSIS — Z7982 Long term (current) use of aspirin: Secondary | ICD-10-CM | POA: Diagnosis not present

## 2022-06-16 DIAGNOSIS — I10 Essential (primary) hypertension: Secondary | ICD-10-CM | POA: Insufficient documentation

## 2022-06-16 DIAGNOSIS — Z85818 Personal history of malignant neoplasm of other sites of lip, oral cavity, and pharynx: Secondary | ICD-10-CM | POA: Insufficient documentation

## 2022-06-16 DIAGNOSIS — K9423 Gastrostomy malfunction: Secondary | ICD-10-CM | POA: Diagnosis present

## 2022-06-16 DIAGNOSIS — E039 Hypothyroidism, unspecified: Secondary | ICD-10-CM | POA: Diagnosis not present

## 2022-06-16 MED ORDER — IOHEXOL 300 MG/ML  SOLN
30.0000 mg | Freq: Once | INTRAMUSCULAR | Status: AC | PRN
  Administered 2022-06-16: 30 mL

## 2022-06-16 NOTE — ED Notes (Signed)
Called [facility] to come transport pt home as she requested.

## 2022-06-16 NOTE — ED Notes (Signed)
[facility] will come transport pt home [phone]. Please call when ready for discharge.

## 2022-06-16 NOTE — Procedures (Signed)
Pre procedural Dx: Dysphagia, tear in existing feeding tube. Post procedural Dx: Same  Successful bedside replacement of exisitng 20 Fr balloon retention gastrostomy tube. Balloon inflated with 9 cc NS and pulled against gastric wall, bumper cinched to skin without difficulty.  KUB with contrast pending - may use once position confirmed.  EBL: None  Complications: None immediate.  Candiss Norse, PA-C

## 2022-06-16 NOTE — ED Provider Notes (Signed)
[facility] DEPT Provider Note   CSN: [id number] Arrival date & time: 06/16/22  0930     History  No chief complaint on file.   Alexander Duran is a 76 y.o. male.  76 year old male with past medical history of dysphagia secondary to malignancy on chronic G tube presents with tear in his G-tube that occurred this morning.  Denies abdominal pain, fever, chills, or other complaints.  This was last replaced in June in the emergency department.  He has not had any issues until this morning.   The history is provided by the patient. No language interpreter was used.       Home Medications Prior to Admission medications   Medication Sig Start Date End Date Taking? Authorizing Provider  Accu-Chek Softclix Lancets lancets use as directed up to 4 times daily 07/27/21   Hongalgi, Lenis Dickinson, MD  acetaminophen (TYLENOL) 160 MG/5ML suspension Place 20.3 mLs (650 mg total) into feeding tube every 8 (eight) hours as needed for mild pain. Patient not taking: Reported on 07/14/2021 06/27/21   Jonetta Osgood, MD  albuterol (VENTOLIN HFA) 108 (90 Base) MCG/ACT inhaler Inhale 2 puffs into the lungs every 6 (six) hours as needed for wheezing or shortness of breath.    [provider]  aspirin 81 MG chewable tablet Place 1 tablet (81 mg total) into feeding tube every evening. 07/27/21   Hongalgi, Lenis Dickinson, MD  Blood Glucose Monitoring Suppl (BLOOD GLUCOSE MONITOR SYSTEM) w/Device KIT Use as directed up to 4 times daily 07/27/21   Vernell Leep D, MD  glucose blood test strip Use as directed up to 4 times daily 07/27/21   Modena Jansky, MD  levothyroxine (SYNTHROID) 100 MCG tablet Place 1 tablet (100 mcg total) into feeding tube daily before breakfast. 07/27/21   Hongalgi, Lenis Dickinson, MD  Water For Irrigation, Sterile (FREE WATER) SOLN Give 60 ml water flush before and after each feeding. 07/27/21   Hongalgi, Lenis Dickinson, MD      Allergies    Iodinated contrast media     Review of Systems   Review of Systems  Constitutional:  Negative for chills and fever.  Gastrointestinal:  Negative for abdominal pain and vomiting.  Skin:  Negative for wound.  All other systems reviewed and are negative.   Physical Exam Updated Vital Signs BP (!) 158/74   Pulse 84   Temp (!) 97.5 F (36.4 C) (Oral)   Resp 20   SpO2 100%  Physical Exam Vitals and nursing note reviewed.  Constitutional:      General: He is not in acute distress.    Appearance: Normal appearance. He is not ill-appearing.  HENT:     Head: Normocephalic and atraumatic.     Nose: Nose normal.  Eyes:     Conjunctiva/sclera: Conjunctivae normal.  Pulmonary:     Effort: Pulmonary effort is normal. No respiratory distress.  Abdominal:     General: There is no distension.     Palpations: Abdomen is soft.     Tenderness: There is no abdominal tenderness. There is no guarding.     Comments: PEG tube placed in the left abdomen.  No surrounding erythema or drainage.  Musculoskeletal:        General: No deformity.  Skin:    Findings: No rash.  Neurological:     Mental Status: He is alert.     ED Results / Procedures / Treatments   Labs (all labs ordered are listed,  but only abnormal results are displayed) Labs Reviewed - No data to display  EKG None  Radiology No results found.  Procedures Procedures    Medications Ordered in ED Medications - No data to display  ED Course/ Medical Decision Making/ A&P                           Medical Decision Making Amount and/or Complexity of Data Reviewed Radiology: ordered.  Risk Prescription drug management.   Medical Decision Making / ED Course   This patient presents to the ED for concern of PEG tube malfunction, this involves an extensive number of treatment options, and is a complaint that carries with it a high risk of complications and morbidity.  The differential diagnosis includes PEG tube malfunction  MDM: 76 year old  male presents with PEG tube malfunction.  Will require replacement.  No signs of infection surrounding the PEG tube site.  Difficulty removing the currently placed PEG tube within the emergency department.  Consult to IR placed.  IR evaluated patient at bedside and replaced the PEG tube.  Will obtain confirmatory plain radiograph. X-ray shows appropriate positioning of the PEG tube.  Patient is appropriate for discharge.  Discharged in stable condition.  Return precaution discussed.  Patient voices understanding and is in agreement with plan.   Lab Tests: -I ordered, reviewed, and interpreted labs.   The pertinent results include:   Labs Reviewed - No data to display    EKG  EKG Interpretation  Date/Time:    Ventricular Rate:    PR Interval:    QRS Duration:   QT Interval:    QTC Calculation:   R Axis:     Text Interpretation:           Imaging Studies ordered: I ordered imaging studies including plain radiograph of the PEG tube for placement I independently visualized and interpreted imaging. I agree with the radiologist interpretation   Medicines ordered and prescription drug management: No orders of the defined types were placed in this encounter.   -I have reviewed the patients home medicines and have made adjustments as needed  Consultations Obtained: I requested consultation with the interventional radiology,  and discussed lab and imaging findings as well as pertinent plan - they recommend: They evaluated patient and replaced the PEG tube at bedside   Reevaluation: After the interventions noted above, I reevaluated the patient and found that they have :resolved  Co morbidities that complicate the patient evaluation  Past Medical History:  Diagnosis Date   Arthritis    Cancer ([place])    Tonsil cancer   Carotid artery stenosis without cerebral infarction 08/30/2015   Difficulty in swallowing    History of radiation therapy    Hypertension    Hypothyroidism  05/09/2014   Other headache syndrome 08/10/2015   Sleep apnea    Weight loss 08/08/2015      Dispostion: Patient discharged in stable condition.  Return precaution discussed.  Final Clinical Impression(s) / ED Diagnoses Final diagnoses:  PEG tube malfunction [facility])    Rx / DC Orders ED Discharge Orders     None         Evlyn Courier, PA-C 06/16/22 1324    Tegeler, Gwenyth Allegra, MD 06/16/22 [id number]

## 2022-06-16 NOTE — Discharge Instructions (Signed)
Your PEG tube was replaced in the emergency room today.  No signs of infection surrounding the PEG tube site.  For any concerning symptoms return to the emergency room otherwise follow-up with your primary care provider as needed.

## 2022-06-16 NOTE — ED Triage Notes (Addendum)
Pt arrived via EMS, from home, g tube tubing cracked this morning after tube feed.    G Tube 90F

## 2023-01-09 ENCOUNTER — Telehealth: Payer: Self-pay | Admitting: Gastroenterology

## 2023-01-09 NOTE — Telephone Encounter (Signed)
I am not certain I quite understand what the exact clinical question is, or which of his providers is asking it, but I am afraid we neither place nor manage feeding tubes at this practice.  It appears he has had recurrent dislodgment of his feeding tube, a tube which was originally placed surgically by Dr. Phylliss Duran of [facility] surgery during a January 2023 hospitalization.  I encourage them to contact [facility] and ask for assistance with his feeding tube.  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  - H. Danis

## 2023-01-09 NOTE — Telephone Encounter (Signed)
Hi Dr. Myrtie Neither,     We received a referral for patient for dysphagia. Patient wife stated patient was recently at the ED in [place] due patient's feeding tube leaking. Patient wife stated patient was advised to be seen with a Gastroenterologist after patient CT scans on 02/20/2023 due the results needing to be be reviewed by a gastroenterologist to determine if the patient needs a pulmonary appointment to have his lungs cleared out due to feeding tube leaking. Patient was last seen with [facility] in 2022 but was last seen by you in 2021. Would like to transfer care back over to you due to being referred back to [facility]. Please review and advise on scheduling.     Thank you.

## 2023-01-13 NOTE — Telephone Encounter (Signed)
Called patient to advise of recommendation. Patient wife stated she will contact [facility].

## 2023-01-22 NOTE — Telephone Encounter (Signed)
[facility] Chan[facility] to [facility], she was advised of Dr. Irving Burton recommendations.

## 2023-10-01 NOTE — Progress Notes (Unsigned)
 Office Note     CC: Reestablish with known carotid artery disease Requesting Provider:  Sim Boast, MD  HPI: Alexander Duran is a 78 y.o. (1946-02-28) male presenting at the request of .Sondra Come, MD to reestablish with known carotid artery disease.  In short, patient was lost to follow-up having previously undergone left-sided carotid stenting in February 2018 by Dr. Darrick Penna.  Right side is known to be occluded.   On exam, Alexander Duran is doing well.  A native of his brother, he graduated from [employer].  He served 4 years active [employer] from [age]-19 74 in the [employer], and was in [place].  After that, he worked a Psychologist, clinical of different jobs.  Now retired, he enjoys mowing.  He continues to live independently with his wife. 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  Radiation followed.  Currently receives nutrition through a PEG tube.  Denies history of stroke, TIA, amaurosis.  Current medications include aspirin, 20 mg statin.  Non-smoker.   Past Medical History:  Diagnosis Date   Arthritis    Cancer (HCC)    Tonsil cancer   Carotid artery stenosis without cerebral infarction 08/30/2015   Difficulty in swallowing    History of radiation therapy    Hypertension    Hypothyroidism 05/09/2014   Other headache syndrome 08/10/2015   Sleep apnea    Weight loss 08/08/2015    Past Surgical History:  Procedure Laterality Date   BACK SURGERY     CAROTID PTA/STENT INTERVENTION Left 08/02/2016   Procedure: Carotid PTA/Stent Intervention;  Surgeon: Sherren Kerns, MD;  Location: [facility] INVASIVE CV LAB;  Service: Cardiovascular;  Laterality: Left;   ESOPHAGOGASTRODUODENOSCOPY N/A 11/15/2016   Procedure: ESOPHAGOGASTRODUODENOSCOPY (EGD);  Surgeon: Sherrilyn Rist, MD;  Location: [facility] ENDOSCOPY;  Service: Endoscopy;  Laterality: N/A;   FOREIGN BODY REMOVAL N/A 11/15/2016   Procedure: FOREIGN BODY REMOVAL;  Surgeon: Sherrilyn Rist,  MD;  Location: [facility] ENDOSCOPY;  Service: Endoscopy;  Laterality: N/A;   LAPAROSCOPIC GASTROSTOMY N/A 06/21/2021   Procedure: LAPAROSCOPIC GASTROSTOMY TUBE PLACEMENT;  Surgeon: Berna Bue, MD;  Location: [facility] OR;  Service: General;  Laterality: N/A;   PERIPHERAL VASCULAR CATHETERIZATION N/A 07/04/2016   Procedure: Aortic Arch Angiography;  Surgeon: Fransisco Hertz, MD;  Location: [facility] INVASIVE CV LAB;  Service: Cardiovascular;  Laterality: N/A;   PERIPHERAL VASCULAR CATHETERIZATION N/A 07/04/2016   Procedure: Carotid & cerebral  Angiography;  Surgeon: Fransisco Hertz, MD;  Location: [facility] INVASIVE CV LAB;  Service: Cardiovascular;  Laterality: N/A;   TOTAL HIP ARTHROPLASTY Left 10/26/2019   Procedure: TOTAL HIP ARTHROPLASTY ANTERIOR APPROACH;  Surgeon: Durene Romans, MD;  Location: [facility] ORS;  Service: Orthopedics;  Laterality: Left;  70 mins   TUMOR REMOVAL     in neck    Social History   Socioeconomic History   Marital status: Married    Spouse name: Not on file   Number of children: Not on file   Years of education: Not on file   Highest education level: Not on file  Occupational History   Occupation: retired  Tobacco Use   Smoking status: Former    Current packs/day: 0.00    Average packs/day: 1 pack/day for 35.0 years (35.0 ttl pk-yrs)    Types: Cigarettes    Start date: 74    Quit date: 2019    Years since quitting: 6.2   Smokeless tobacco: Never  Vaping Use  Vaping status: Never Used  Substance and Sexual Activity   Alcohol use: Not Currently    Alcohol/week: 2.0 standard drinks of alcohol    Types: 2 Shots of liquor per week    Comment: rarely   Drug use: Not Currently    Types: Marijuana   Sexual activity: Not on file  Other Topics Concern   Not on file  Social History Narrative   Not on file   Social Drivers of Health   Financial Resource Strain: Not on file  Food Insecurity: Not on file  Transportation Needs: Not on file  Physical Activity: Not on file  Stress: Not  on file  Social Connections: Unknown (10/30/2021)   Received from [facility]   Social Network    Social Network: Not on file  Intimate Partner Violence: Not At Risk (12/24/2022)   Received from [facility]   HITS    Over the last 12 months how often did your partner physically hurt you?: Never    Over the last 12 months how often did your partner insult you or talk down to you?: Never    Over the last 12 months how often did your partner threaten you with physical harm?: Never    Over the last 12 months how often did your partner scream or curse at you?: Never   Family History  Problem Relation Age of Onset   Cancer Father        lung cancer   COPD Mother    Macular degeneration Mother    Colon cancer Neg Hx    Stomach cancer Neg Hx    Pancreatic cancer Neg Hx     Current Outpatient Medications  Medication Sig Dispense Refill   Accu-Chek Softclix Lancets lancets use as directed up to 4 times daily 100 each 0   acetaminophen (TYLENOL) 160 MG/5ML suspension Place 20.3 mLs (650 mg total) into feeding tube every 8 (eight) hours as needed for mild pain. (Patient not taking: Reported on 07/14/2021) 118 mL 0   albuterol (VENTOLIN HFA) 108 (90 Base) MCG/ACT inhaler Inhale 2 puffs into the lungs every 6 (six) hours as needed for wheezing or shortness of breath.     aspirin 81 MG chewable tablet Place 1 tablet (81 mg total) into feeding tube every evening.     Blood Glucose Monitoring Suppl (BLOOD GLUCOSE MONITOR SYSTEM) w/Device KIT Use as directed up to 4 times daily 1 kit 30   glucose blood test strip Use as directed up to 4 times daily 100 each 0   levothyroxine (SYNTHROID) 100 MCG tablet Place 1 tablet (100 mcg total) into feeding tube daily before breakfast. 30 tablet 1   Water For Irrigation, Sterile (FREE WATER) SOLN Give 60 ml water flush before and after each feeding. 1000 mL 15   No current facility-administered medications for this visit.    Allergies   Allergen Reactions   Iodinated Contrast Media     Other reaction(s): Weakness present     REVIEW OF SYSTEMS:  [X]  denotes positive finding, [ ]  denotes negative finding Cardiac  Comments:  Chest pain or chest pressure:    Shortness of breath upon exertion:    Short of breath when lying flat:    Irregular heart rhythm:        Vascular    Pain in calf, thigh, or hip brought on by ambulation:    Pain in feet at night that wakes you up from your sleep:  Blood clot in your veins:    Leg swelling:         Pulmonary    Oxygen at home:    Productive cough:     Wheezing:         Neurologic    Sudden weakness in arms or legs:     Sudden numbness in arms or legs:     Sudden onset of difficulty speaking or slurred speech:    Temporary loss of vision in one eye:     Problems with dizziness:         Gastrointestinal    Blood in stool:     Vomited blood:         Genitourinary    Burning when urinating:     Blood in urine:        Psychiatric    Major depression:         Hematologic    Bleeding problems:    Problems with blood clotting too easily:        Skin    Rashes or ulcers:        Constitutional    Fever or chills:      PHYSICAL EXAMINATION:  There were no vitals filed for this visit.  General:  WDWN in NAD; vital signs documented above Gait: Not observed HENT: WNL, significant defect in the right neck from previous radical neck dissection, speech difficulties due to radical neck. Pulmonary: normal non-labored breathing , without wheezing Cardiac: regular HR Abdomen: soft, NT, no masses Skin: without rashes Vascular Exam/Pulses:  Right Left  Radial 2+ (normal) 2+ (normal)  Ulnar    Femoral    Popliteal    DP 2+ (normal) 2+ (normal)  PT     Extremities: without ischemic changes, without Gangrene , without cellulitis; without open wounds;  Musculoskeletal: no muscle wasting or atrophy  Neurologic: A&O X 3;  No focal weakness or paresthesias are  detected Psychiatric:  The pt has Normal affect.   Non-Invasive Vascular Imaging:       ASSESSMENT/PLAN: Alexander Duran is a 78 y.o. male presenting to reestablish status post left-sided transfemoral stenting in 2018 for asymptomatic carotid stenosis.  Contralateral side is occluded.  Velocities were reviewed from [place] paperwork, demonstrating a small increase since he was last seen.  Based on velocities, stenosis is moderate.  In an effort to limit in-stent restenosis, I asked Trevelle to restart Plavix on a daily basis, as well as increase his statin therapy to 40 mg daily from 20 mg daily.  We discussed the signs and symptoms of stroke.  I asked him to call 911 immediately should any these occur. My plan is to follow him up in 6 months to ensure that the velocities do not continue to increase.   Kayla Part, MD [facility] [phone]

## 2023-10-02 ENCOUNTER — Encounter: Payer: Self-pay | Admitting: Vascular Surgery

## 2023-10-02 ENCOUNTER — Ambulatory Visit (INDEPENDENT_AMBULATORY_CARE_PROVIDER_SITE_OTHER): Admitting: Vascular Surgery

## 2023-10-02 VITALS — BP 128/85 | HR 65 | Temp 98.7°F | Resp 18 | Ht 66.0 in | Wt 134.9 lb

## 2023-10-02 DIAGNOSIS — Z95828 Presence of other vascular implants and grafts: Secondary | ICD-10-CM | POA: Diagnosis not present

## 2023-10-02 DIAGNOSIS — T82856A Stenosis of peripheral vascular stent, initial encounter: Secondary | ICD-10-CM

## 2023-10-16 ENCOUNTER — Other Ambulatory Visit: Payer: Self-pay | Admitting: *Deleted

## 2023-10-16 DIAGNOSIS — Z95828 Presence of other vascular implants and grafts: Secondary | ICD-10-CM

## 2023-11-05 ENCOUNTER — Emergency Department (HOSPITAL_COMMUNITY)

## 2023-11-05 ENCOUNTER — Emergency Department (HOSPITAL_COMMUNITY)
Admission: EM | Admit: 2023-11-05 | Discharge: 2023-11-05 | Disposition: A | Discharge status: Home / Self Care | Attending: Emergency Medicine | Admitting: Emergency Medicine

## 2023-11-05 ENCOUNTER — Encounter (HOSPITAL_COMMUNITY): Payer: Self-pay

## 2023-11-05 DIAGNOSIS — Z7982 Long term (current) use of aspirin: Secondary | ICD-10-CM | POA: Diagnosis not present

## 2023-11-05 DIAGNOSIS — K9420 Gastrostomy complication, unspecified: Secondary | ICD-10-CM

## 2023-11-05 DIAGNOSIS — K9423 Gastrostomy malfunction: Secondary | ICD-10-CM | POA: Insufficient documentation

## 2023-11-05 MED ORDER — IOHEXOL 300 MG/ML  SOLN
50.0000 mL | Freq: Once | INTRAMUSCULAR | Status: AC | PRN
  Administered 2023-11-05: 30 mL

## 2023-11-05 NOTE — ED Provider Notes (Signed)
 [place] EMERGENCY DEPARTMENT AT [facility] Provider Note   CSN: [id number] Arrival date & time: 11/05/23  0805     History  Chief Complaint  Patient presents with   G Tube Issue    Alexander Duran is a 78 y.o. male.  The history is provided by the patient and medical records. No language interpreter was used.     78 year old male with history of tonsillar cancer s/p a PEG tube placement presenting with concerns of leakage from PEG tube.  Patient report for the past 5 days his G-tube kept popping out of place and his neighbor who works for radiology has been replacing it for him.  However yesterday he begins to leak thus prompting this ER visit.  He mention he normally gets his G-tube replaced every 6 months but the last time he had it done was in July of last year.  States everything has been functioning well up until this point.  He feeds exclusively through his G-tube.  He does not take anything by mouth.  He has no other complaint.  Home Medications Prior to Admission medications   Medication Sig Start Date End Date Taking? Authorizing Provider  Accu-Chek Softclix Lancets lancets use as directed up to 4 times daily Patient not taking: Reported on 10/02/2023 07/27/21   Hongalgi, Anand D, MD  acetaminophen  (TYLENOL ) 160 MG/5ML suspension Place 20.3 mLs (650 mg total) into feeding tube every 8 (eight) hours as needed for mild pain. Patient not taking: Reported on 07/14/2021 06/27/21   Burton Casey, MD  albuterol  (VENTOLIN  HFA) 108 [phone] Base) MCG/ACT inhaler Inhale 2 puffs into the lungs every 6 (six) hours as needed for wheezing or shortness of breath.    [provider]  aspirin  81 MG chewable tablet Place 1 tablet (81 mg total) into feeding tube every evening. 07/27/21   Hongalgi, Thomasene Flemings, MD  atorvastatin  (LIPITOR) 20 MG tablet Take by mouth. 06/03/23   [provider]  Blood Glucose Monitoring Suppl (BLOOD GLUCOSE MONITOR SYSTEM) w/Device KIT Use  as directed up to 4 times daily Patient not taking: Reported on 10/02/2023 07/27/21   Hongalgi, Anand D, MD  glucose blood test strip Use as directed up to 4 times daily Patient not taking: Reported on 10/02/2023 07/27/21   Hongalgi, Anand D, MD  levothyroxine  (SYNTHROID ) 100 MCG tablet Place 1 tablet (100 mcg total) into feeding tube daily before breakfast. 07/27/21   Hongalgi, Anand D, MD  Water  For Irrigation, Sterile (FREE WATER ) SOLN Give 60 ml water  flush before and after each feeding. Patient not taking: Reported on 10/02/2023 07/27/21   Hongalgi, Anand D, MD      Allergies    Iodinated contrast media    Review of Systems   Review of Systems  All other systems reviewed and are negative.   Physical Exam Updated Vital Signs BP (!) 151/80   Pulse 71   Temp 97.6 F (36.4 C) (Oral)   Resp 18   Ht 5\' 6"  (1.676 m)   Wt 60.8 kg   SpO2 93%   BMI 21.63 kg/m  Physical Exam Constitutional:      General: He is not in acute distress.    Appearance: He is well-developed.  HENT:     Head: Atraumatic.  Eyes:     Conjunctiva/sclera: Conjunctivae normal.  Cardiovascular:     Rate and Rhythm: Normal rate and regular rhythm.  Pulmonary:     Effort: Pulmonary effort is normal.  Breath sounds: Normal breath sounds.  Abdominal:     Palpations: Abdomen is soft.     Comments: Abdomen soft nontender.  NG tube appears to be in place without any signs of infection or surrounding skin changes.  Musculoskeletal:     Cervical back: Normal range of motion and neck supple.  Skin:    Findings: No rash.  Neurological:     Mental Status: He is alert.     ED Results / Procedures / Treatments   Labs (all labs ordered are listed, but only abnormal results are displayed) Labs Reviewed - No data to display  EKG None  Radiology DG ABDOMEN PEG TUBE LOCATION Result Date: 11/05/2023 CLINICAL DATA:  [id number] S/P percutaneous endoscopic gastrostomy (PEG) tube placement [facility]) [id number] EXAM: ABDOMEN -  1 VIEW COMPARISON:  June 16, 2022 FINDINGS: The patient received 30 ml of Omnipaque  contrast via the gastrostomy tube. After the hand injection of enteric contrast, there is opacification of the stomach rugae with normal flow of contrast into the duodenum. The gastric retention balloon is well positioned in the stomach lumen. Nonobstructive bowel gas pattern. Partially visualized left hip arthroplasty. Osteopenia. Multilevel degenerative disc disease of the spine. IMPRESSION: Well-positioned percutaneous gastrostomy tube. Electronically Signed   By: Rance Burrows M.D.   On: 11/05/2023 13:15    Procedures .Gastrostomy tube replacement  Date/Time: 11/05/2023 9:52 AM  Performed by: Debbra Fairy, PA-C Authorized by: Debbra Fairy, PA-C  Consent: Verbal consent obtained. Risks and benefits: risks, benefits and alternatives were discussed Consent given by: patient Patient understanding: patient states understanding of the procedure being performed Patient consent: the patient's understanding of the procedure matches consent given Procedure consent: procedure consent matches procedure scheduled Required items: required blood products, implants, devices, and special equipment available Patient identity confirmed: verbally with patient and arm band Local anesthesia used: no  Anesthesia: Local anesthesia used: no  Sedation: Patient sedated: no  Patient tolerance: patient tolerated the procedure well with no immediate complications Comments: Patient has a 75 [place] G-tube that needs to be replaced.  Initial attempt to deflate the balloon without any success.  Bedside ultrasound confirmed balloon is still inflated.  I cut the catheter tube and was able to manually deflate the balloon with success.  OG tube was removed, a new 20 [place] G-tube was replaced by me.  Balloon was inflated with 10 cc of sterile water .       Medications Ordered in ED Medications  iohexol  (OMNIPAQUE ) 300 MG/ML  solution 50 mL (30 mLs Per Tube Contrast Given 11/05/23 1110)    ED Course/ Medical Decision Making/ A&P Clinical Course as of 11/05/23 1331  Wed Nov 05, 2023  852 78 year old pleasant gentleman presented to ED with concern for malfunctioning G-tube, which has been chronically in place.  Appears that he ruptured or broke the end of the tubing.  We were able to cut the back end and drain the water  balloon and remove the old G-tube, replaced with same size tube easily and flushed.  Will confirm on xray film. [MT]    Clinical Course User Index [MT] Trifan, Janalyn Me, MD                                 Medical Decision Making Amount and/or Complexity of Data Reviewed Radiology: ordered.  Risk Prescription drug management.   BP (!) 151/80   Pulse 71   Temp 97.6 F (36.4 C) (Oral)  Resp 18   Ht 5\' 6"  (1.676 m)   Wt 60.8 kg   SpO2 93%   BMI 21.63 kg/m   59:22 AM  78 year old male with history of tonsillar cancer s/p a PEG tube placement presenting with concerns of leakage from PEG tube.  Patient report for the past 5 days his G-tube kept popping out of place and his neighbor who works for radiology has been replacing it for him.  However yesterday he begins to leak thus prompting this ER visit.  He mention he normally gets his G-tube replaced every 6 months but the last time he had it done was in July of last year.  States everything has been functioning well up until this point.  He feeds exclusively through his G-tube.  He does not take anything by mouth.  He has no other complaint.  On exam elderly male resting comfortably appears to be in no acute discomfort.  His PEG tube was in place and there are no surrounding skin changes or signs of infection.  The catheter appears to be tethered and the tape is wrapped around it.  Given that his PEG tube has been in place for longer than usual, and now it is leaking and popping out of place, however replace it with another 20 [place]  G-tube.  X-ray of PEG tube-performed independently viewed and interpreted by me and appears to demonstrate well-positioned G-tube.  Patient is stable for discharge.  Patient provided with additional equipments such as syringes        Final Clinical Impression(s) / ED Diagnoses Final diagnoses:  Complication of gastrostomy tube [facility])    Rx / DC Orders ED Discharge Orders     None         Debbra Fairy, PA-C 11/05/23 1332    Arvilla Birmingham, MD 11/05/23 1531

## 2023-11-05 NOTE — Discharge Instructions (Signed)
 Your G-tube have been replaced and is functioning appropriately.  You may follow-up with your doctor for further care.

## 2023-11-05 NOTE — ED Triage Notes (Signed)
 Pt c/o G tube leaking starting yesterday.  Pt reports it has come out twice over the past week and his neighbor, who works in Radiology, put it back in.  Pt is concerned it has a hole in it.    [facility].

## 2024-01-02 ENCOUNTER — Encounter: Payer: Self-pay | Admitting: Advanced Practice Midwife

## 2024-02-03 ENCOUNTER — Other Ambulatory Visit (HOSPITAL_COMMUNITY): Payer: Self-pay

## 2024-04-01 ENCOUNTER — Ambulatory Visit (INDEPENDENT_AMBULATORY_CARE_PROVIDER_SITE_OTHER): Admitting: Physician Assistant

## 2024-04-01 ENCOUNTER — Ambulatory Visit (HOSPITAL_COMMUNITY)
Admission: RE | Admit: 2024-04-01 | Discharge: 2024-04-01 | Disposition: A | Discharge status: Home / Self Care | Source: Ambulatory Visit | Attending: Vascular Surgery | Admitting: Vascular Surgery

## 2024-04-01 VITALS — BP 152/80 | HR 57 | Temp 97.7°F | Wt 131.3 lb

## 2024-04-01 DIAGNOSIS — I6521 Occlusion and stenosis of right carotid artery: Secondary | ICD-10-CM | POA: Diagnosis present

## 2024-04-01 DIAGNOSIS — I6522 Occlusion and stenosis of left carotid artery: Secondary | ICD-10-CM

## 2024-04-01 DIAGNOSIS — Z95828 Presence of other vascular implants and grafts: Secondary | ICD-10-CM | POA: Diagnosis not present

## 2024-04-01 NOTE — Progress Notes (Signed)
 Office Note   History of Present Illness   Alexander Duran is a 78 y.o. (07/26/1945) male who presents for surveillance of carotid artery stenosis.  He has a known occlusion of the right ICA.  He also has a history of left ICA stenting in February 2018 by Dr. Harvey.  He returns today for follow-up.  He has no complaints at today's office visit.  He says he is happy to live another day.  He denies any strokelike symptoms such as slurred speech, facial droop, sudden weakness/numbness, or sudden visual changes.   He takes a daily aspirin , Plavix , and statin.  He does have some speech difficulties due to prior radical neck dissection.  Current Outpatient Medications  Medication Sig Dispense Refill   Accu-Chek Softclix Lancets lancets use as directed up to 4 times daily (Patient not taking: Reported on 10/02/2023) 100 each 0   acetaminophen  (TYLENOL ) 160 MG/5ML suspension Place 20.3 mLs (650 mg total) into feeding tube every 8 (eight) hours as needed for mild pain. (Patient not taking: Reported on 07/14/2021) 118 mL 0   albuterol  (VENTOLIN  HFA) 108 (90 Base) MCG/ACT inhaler Inhale 2 puffs into the lungs every 6 (six) hours as needed for wheezing or shortness of breath.     aspirin  81 MG chewable tablet Place 1 tablet (81 mg total) into feeding tube every evening.     atorvastatin  (LIPITOR) 20 MG tablet Take by mouth.     Blood Glucose Monitoring Suppl (BLOOD GLUCOSE MONITOR SYSTEM) w/Device KIT Use as directed up to 4 times daily (Patient not taking: Reported on 10/02/2023) 1 kit 30   glucose blood test strip Use as directed up to 4 times daily (Patient not taking: Reported on 10/02/2023) 100 each 0   levothyroxine  (SYNTHROID ) 100 MCG tablet Place 1 tablet (100 mcg total) into feeding tube daily before breakfast. 30 tablet 1   Water  For Irrigation, Sterile (FREE WATER ) SOLN Give 60 ml water  flush before and after each feeding. (Patient not taking: Reported on 10/02/2023) 1000 mL 15   No current  facility-administered medications for this visit.    REVIEW OF SYSTEMS (negative unless checked):   Cardiac:  []  Chest pain or chest pressure? []  Shortness of breath upon activity? []  Shortness of breath when lying flat? []  Irregular heart rhythm?  Vascular:  []  Pain in calf, thigh, or hip brought on by walking? []  Pain in feet at night that wakes you up from your sleep? []  Blood clot in your veins? []  Leg swelling?  Pulmonary:  []  Oxygen at home? []  Productive cough? []  Wheezing?  Neurologic:  []  Sudden weakness in arms or legs? []  Sudden numbness in arms or legs? []  Sudden onset of difficult speaking or slurred speech? []  Temporary loss of vision in one eye? []  Problems with dizziness?  Gastrointestinal:  []  Blood in stool? []  Vomited blood?  Genitourinary:  []  Burning when urinating? []  Blood in urine?  Psychiatric:  []  Major depression  Hematologic:  []  Bleeding problems? []  Problems with blood clotting?  Dermatologic:  []  Rashes or ulcers?  Constitutional:  []  Fever or chills?  Ear/Nose/Throat:  []  Change in hearing? []  Nose bleeds? []  Sore throat?  Musculoskeletal:  []  Back pain? []  Joint pain? []  Muscle pain?   Physical Examination   Vitals:   04/01/24 1421 04/01/24 1423  BP: (!) 156/79 (!) 152/80  Pulse: (!) 57 (!) 57  Temp: 97.7 F (36.5 C)   TempSrc: Temporal   Weight: 131 lb  4.8 oz (59.6 kg)    Body mass index is 21.19 kg/m.  General:  WDWN in NAD; vital signs documented above.  Difficulty with speech due to prior surgery Gait: Not observed HENT: WNL, normocephalic Pulmonary: normal non-labored breathing , without rales, rhonchi,  wheezing Cardiac: Regular Abdomen: soft, NT, no masses Skin: without rashes Vascular Exam/Pulses: palpable radial pulses bilaterally Extremities: without ischemic changes, without gangrene , without cellulitis; without open wounds;  Musculoskeletal: no muscle wasting or atrophy  Neurologic: A&O  X 3;  No focal weakness or paresthesias are detected Psychiatric:  The pt has Normal affect.  Non-Invasive Vascular Imaging   Bilateral Carotid Duplex (04/01/2024):  R ICA stenosis:  Occluded R [facility]: not identified L ICA stenosis: Patent stent without stenosis.  Distal ICA with 40 to 59% stenosis L [facility]: Not identified   Medical Decision Making   Alexander Duran is a 78 y.o. male who presents for surveillance of carotid artery stenosis  Based on the patient's vascular studies, he has a known right ICA occlusion.  His left carotid artery stent is patent without restenosis.  He does have stable 40 to 59% stenosis of the distal ICA He denies any strokelike symptoms such as slurred speech, facial droop, sudden visual changes, or sudden weakness/numbness. On exam he has no motor or sensory deficits.  He has palpable and equal radial pulses bilaterally. He will continue his daily aspirin , Plavix , and statin.  He can follow-up with our office in 1 year with repeat carotid duplex  Ahmed Holster PA-C [facility] [place] Office: [phone]  Clinic MD: [place]
